# Patient Record
Sex: Female | Born: 1935
Health system: Southern US, Community
[De-identification: ages and names within clinical notes are randomized; demographics above are authoritative.]

## PROBLEM LIST (undated history)

## (undated) DIAGNOSIS — M542 Cervicalgia: Secondary | ICD-10-CM

## (undated) DIAGNOSIS — I890 Lymphedema, not elsewhere classified: Secondary | ICD-10-CM

## (undated) DIAGNOSIS — H409 Unspecified glaucoma: Secondary | ICD-10-CM

## (undated) DIAGNOSIS — J189 Pneumonia, unspecified organism: Secondary | ICD-10-CM

## (undated) DIAGNOSIS — H4011X0 Primary open-angle glaucoma, stage unspecified: Secondary | ICD-10-CM

## (undated) DIAGNOSIS — R519 Headache, unspecified: Secondary | ICD-10-CM

## (undated) DIAGNOSIS — M2550 Pain in unspecified joint: Secondary | ICD-10-CM

## (undated) DIAGNOSIS — M1990 Unspecified osteoarthritis, unspecified site: Secondary | ICD-10-CM

## (undated) DIAGNOSIS — N183 Chronic kidney disease, stage 3 unspecified: Secondary | ICD-10-CM

## (undated) DIAGNOSIS — H9190 Unspecified hearing loss, unspecified ear: Secondary | ICD-10-CM

## (undated) DIAGNOSIS — R7309 Other abnormal glucose: Secondary | ICD-10-CM

## (undated) DIAGNOSIS — I10 Essential (primary) hypertension: Secondary | ICD-10-CM

## (undated) DIAGNOSIS — R51 Headache: Secondary | ICD-10-CM

## (undated) DIAGNOSIS — G4700 Insomnia, unspecified: Secondary | ICD-10-CM

## (undated) DIAGNOSIS — Z79899 Other long term (current) drug therapy: Secondary | ICD-10-CM

## (undated) DIAGNOSIS — R413 Other amnesia: Secondary | ICD-10-CM

## (undated) HISTORY — DX: Morbid (severe) obesity due to excess calories: E66.01

## (undated) HISTORY — PX: ABDOMINAL HYSTERECTOMY: SHX81

## (undated) HISTORY — DX: Headache, unspecified: R51.9

## (undated) HISTORY — DX: Headache: R51

## (undated) HISTORY — PX: EYE SURGERY: SHX253

## (undated) HISTORY — DX: Chronic kidney disease, stage 3 unspecified: N18.30

## (undated) HISTORY — DX: Other amnesia: R41.3

## (undated) HISTORY — DX: Other abnormal glucose: R73.09

## (undated) HISTORY — DX: Other long term (current) drug therapy: Z79.899

## (undated) HISTORY — DX: Pain in unspecified joint: M25.50

## (undated) HISTORY — DX: Cervicalgia: M54.2

## (undated) HISTORY — DX: Unspecified hearing loss, unspecified ear: H91.90

## (undated) HISTORY — DX: Unspecified osteoarthritis, unspecified site: M19.90

## (undated) HISTORY — DX: Lymphedema, not elsewhere classified: I89.0

## (undated) HISTORY — DX: Insomnia, unspecified: G47.00

## (undated) HISTORY — DX: Chronic kidney disease, stage 3 (moderate): N18.3

## (undated) HISTORY — DX: Primary open-angle glaucoma, stage unspecified: H40.11X0

---

## 1997-12-01 ENCOUNTER — Encounter: Admission: RE | Admit: 1997-12-01 | Discharge: 1998-03-01 | Payer: Self-pay | Admitting: Gastroenterology

## 1998-01-12 ENCOUNTER — Ambulatory Visit (HOSPITAL_COMMUNITY): Admission: RE | Admit: 1998-01-12 | Discharge: 1998-01-12 | Payer: Self-pay | Admitting: Gastroenterology

## 1998-06-21 ENCOUNTER — Encounter: Payer: Self-pay | Admitting: Cardiology

## 1998-06-21 ENCOUNTER — Ambulatory Visit (HOSPITAL_COMMUNITY): Admission: RE | Admit: 1998-06-21 | Discharge: 1998-06-21 | Payer: Self-pay | Admitting: Cardiology

## 1998-07-08 ENCOUNTER — Ambulatory Visit (HOSPITAL_COMMUNITY): Admission: RE | Admit: 1998-07-08 | Discharge: 1998-07-08 | Payer: Self-pay | Admitting: Cardiology

## 1999-01-14 ENCOUNTER — Ambulatory Visit (HOSPITAL_COMMUNITY): Admission: RE | Admit: 1999-01-14 | Discharge: 1999-01-14 | Payer: Self-pay | Admitting: Endocrinology

## 1999-08-11 ENCOUNTER — Encounter: Payer: Self-pay | Admitting: Cardiology

## 1999-08-11 ENCOUNTER — Ambulatory Visit (HOSPITAL_COMMUNITY): Admission: RE | Admit: 1999-08-11 | Discharge: 1999-08-11 | Payer: Self-pay | Admitting: Cardiology

## 1999-09-28 ENCOUNTER — Encounter: Admission: RE | Admit: 1999-09-28 | Discharge: 1999-10-17 | Payer: Self-pay | Admitting: Orthopedic Surgery

## 2000-08-30 ENCOUNTER — Ambulatory Visit (HOSPITAL_COMMUNITY): Admission: RE | Admit: 2000-08-30 | Discharge: 2000-08-30 | Payer: Self-pay | Admitting: Gastroenterology

## 2001-03-19 ENCOUNTER — Encounter: Payer: Self-pay | Admitting: Ophthalmology

## 2001-03-21 ENCOUNTER — Ambulatory Visit (HOSPITAL_COMMUNITY): Admission: RE | Admit: 2001-03-21 | Discharge: 2001-03-21 | Payer: Self-pay | Admitting: Ophthalmology

## 2001-09-19 ENCOUNTER — Encounter: Admission: RE | Admit: 2001-09-19 | Discharge: 2001-09-19 | Payer: Self-pay | Admitting: Endocrinology

## 2001-09-19 ENCOUNTER — Encounter: Payer: Self-pay | Admitting: Endocrinology

## 2002-04-21 ENCOUNTER — Encounter: Payer: Self-pay | Admitting: Endocrinology

## 2002-04-21 ENCOUNTER — Ambulatory Visit (HOSPITAL_COMMUNITY): Admission: RE | Admit: 2002-04-21 | Discharge: 2002-04-21 | Payer: Self-pay | Admitting: Endocrinology

## 2002-05-15 ENCOUNTER — Encounter: Payer: Self-pay | Admitting: Ophthalmology

## 2002-05-22 ENCOUNTER — Ambulatory Visit (HOSPITAL_COMMUNITY): Admission: RE | Admit: 2002-05-22 | Discharge: 2002-05-23 | Payer: Self-pay | Admitting: Ophthalmology

## 2002-05-30 ENCOUNTER — Ambulatory Visit (HOSPITAL_COMMUNITY): Admission: RE | Admit: 2002-05-30 | Discharge: 2002-05-31 | Payer: Self-pay | Admitting: Ophthalmology

## 2003-05-04 ENCOUNTER — Encounter: Payer: Self-pay | Admitting: Endocrinology

## 2003-05-04 ENCOUNTER — Ambulatory Visit (HOSPITAL_COMMUNITY): Admission: RE | Admit: 2003-05-04 | Discharge: 2003-05-04 | Payer: Self-pay | Admitting: Endocrinology

## 2003-08-26 ENCOUNTER — Emergency Department (HOSPITAL_COMMUNITY): Admission: AD | Admit: 2003-08-26 | Discharge: 2003-08-26 | Payer: Self-pay | Admitting: Family Medicine

## 2003-10-09 ENCOUNTER — Ambulatory Visit (HOSPITAL_COMMUNITY): Admission: RE | Admit: 2003-10-09 | Discharge: 2003-10-11 | Payer: Self-pay | Admitting: Ophthalmology

## 2003-12-16 ENCOUNTER — Ambulatory Visit (HOSPITAL_COMMUNITY): Admission: RE | Admit: 2003-12-16 | Discharge: 2003-12-16 | Payer: Self-pay | Admitting: Pulmonary Disease

## 2003-12-25 ENCOUNTER — Emergency Department (HOSPITAL_COMMUNITY): Admission: EM | Admit: 2003-12-25 | Discharge: 2003-12-25 | Payer: Self-pay | Admitting: Emergency Medicine

## 2004-08-18 ENCOUNTER — Encounter: Admission: RE | Admit: 2004-08-18 | Discharge: 2004-08-18 | Payer: Self-pay | Admitting: General Surgery

## 2004-08-18 ENCOUNTER — Ambulatory Visit (HOSPITAL_COMMUNITY): Admission: RE | Admit: 2004-08-18 | Discharge: 2004-08-18 | Payer: Self-pay | Admitting: General Surgery

## 2004-09-21 ENCOUNTER — Encounter (INDEPENDENT_AMBULATORY_CARE_PROVIDER_SITE_OTHER): Payer: Self-pay | Admitting: Specialist

## 2004-09-21 ENCOUNTER — Ambulatory Visit (HOSPITAL_COMMUNITY): Admission: RE | Admit: 2004-09-21 | Discharge: 2004-09-21 | Payer: Self-pay | Admitting: General Surgery

## 2005-06-05 ENCOUNTER — Ambulatory Visit (HOSPITAL_COMMUNITY): Admission: RE | Admit: 2005-06-05 | Discharge: 2005-06-05 | Payer: Self-pay | Admitting: Pulmonary Disease

## 2005-11-13 ENCOUNTER — Ambulatory Visit (HOSPITAL_COMMUNITY): Admission: RE | Admit: 2005-11-13 | Discharge: 2005-11-13 | Payer: Self-pay | Admitting: Pulmonary Disease

## 2006-10-08 ENCOUNTER — Emergency Department (HOSPITAL_COMMUNITY): Admission: EM | Admit: 2006-10-08 | Discharge: 2006-10-09 | Payer: Self-pay | Admitting: Emergency Medicine

## 2007-07-12 ENCOUNTER — Encounter: Admission: RE | Admit: 2007-07-12 | Discharge: 2007-07-31 | Payer: Self-pay | Admitting: Pulmonary Disease

## 2007-08-06 ENCOUNTER — Encounter: Admission: RE | Admit: 2007-08-06 | Discharge: 2007-08-08 | Payer: Self-pay | Admitting: Pulmonary Disease

## 2010-08-20 ENCOUNTER — Encounter: Payer: Self-pay | Admitting: Pulmonary Disease

## 2010-09-13 ENCOUNTER — Ambulatory Visit (HOSPITAL_COMMUNITY)
Admission: RE | Admit: 2010-09-13 | Discharge: 2010-09-13 | Disposition: A | Discharge status: Home / Self Care | Payer: Medicare HMO | Source: Ambulatory Visit | Attending: Pulmonary Disease | Admitting: Pulmonary Disease

## 2010-09-13 ENCOUNTER — Other Ambulatory Visit (HOSPITAL_COMMUNITY): Payer: Self-pay | Admitting: Pulmonary Disease

## 2010-09-13 DIAGNOSIS — R059 Cough, unspecified: Secondary | ICD-10-CM | POA: Insufficient documentation

## 2010-09-13 DIAGNOSIS — R05 Cough: Secondary | ICD-10-CM

## 2010-12-16 NOTE — Procedures (Signed)
[place]  Patient:    [name], [name]                       MRN: [id number] Proc. Date: 08/30/00 Adm. Date:  [id number] Attending:  Juanita Craver CC:         Elayne Snare, M.D.                           Procedure Report  DATE OF BIRTH:  July 14, 1929  REFERRING PHYSICIAN:  Elayne Snare, M.D.  PROCEDURE PERFORMED:  Colonoscopy.  ENDOSCOPIST:  Nelwyn Salisbury, M.D.  INSTRUMENT USED:  Olympus video colonoscope.  INDICATIONS FOR PROCEDURE:  History of colonic polyps removed over six years ago in a 77 year old black female, rule out recurrent polyps.  PREPROCEDURE PREPARATION:  Informed consent was procured from the patient. The patient was fasted for eight hours prior to the procedure and prepped with a bottle of magnesium citrate and a gallon of NuLytely the night prior to the procedure.  PREPROCEDURE PHYSICAL:  The patient had stable vital signs.  Neck supple. Chest clear to auscultation.  S1, S2 regular.  Abdomen soft with normal abdominal bowel sounds.  DESCRIPTION OF PROCEDURE:  The patient was placed in the left lateral decubitus position and sedated with 20 mg of Demerol and 3 mg of Versed intravenously.  Once the patient was adequately sedated and maintained on low-flow oxygen and continuous cardiac monitoring, the Olympus video colonoscope was advanced from the rectum to the cecum with difficulty secondary to a large amount of residual stool in the colon.  The patient had a very poor prep.  There was severe diverticular disease in the left colon with inspissated stool in the several of the diverticular pockets.  No masses, polyps or erosions were seen and the procedure was complete up to the cecum. The cecal base was full of solid stool and therefore cecal base was not visualized.  The patient tolerated the procedure well without complications. Small internal hemorrhoids were seen on retroflexion.  IMPRESSION: 1. Small nonbleeding  internal hemorrhoid. 2. Severe diverticulosis in left colon with stool in several of the    diverticular pockets. 3. Large amount of residual stool in the colon.  No masses or polyps seen.    A very small lesion may have been missed.  RECOMMENDATIONS: 1. The patient has been advised to avoid nuts, seeds and popcorn in her    diet.  A high fiber diet has been recommended. 2. Outpatient follow-up is advised on a p.r.n. basis.  Repeat colorectal    cancer screening is recommended in the next 10 years or earlier if the    patient were to develop any abnormal symptoms in the interim.DD:  08/30/00 TD:  08/30/00 Job: [id number]

## 2010-12-16 NOTE — Discharge Summary (Signed)
NAME:  [name], [name]                          ACCOUNT NO.:  [id number]   MEDICAL RECORD NO.:  [id number]                   PATIENT TYPE:  OIB   LOCATION:  [id number]                                 FACILITY:  [facility]   PHYSICIAN:  Garey Ham, M.D.             DATE OF BIRTH:  06-21-1930   DATE OF ADMISSION:  05/30/2002  DATE OF DISCHARGE:  05/31/2002                                 DISCHARGE SUMMARY   HISTORY OF PRESENT ILLNESS:  This was an urgent emergency outpatient  admission of this 77 year old black female who had retained cataract  fragments following a recent complicated  cataract implant procedure.  Secondary increased intraocular pressure was noted with applanation  tonometry at 52 mm in the left eye.  The patient had a previous history of  chronic open-angle glaucoma and successful trabeculectomy surgery in the  left eye.  The patient was felt to be in satisfactory condition for the  proposed surgery.   HOSPITAL COURSE:  The patient was taken to the operating room where a  complex posterior vitrectomy was carried out using a vitreous infusion  suction cutter.  The retained lens fragments were removed, and no retinal  tears or detachment areas were noted either at the posterior retina or in  the peripheral retina.  The intraocular lens implant was left in place as  well as the trabeculectomy conjunctival bleb.   The patient was taken to the recovery room and subsequently to the 23-hour  observation unit.  The patient was seen on the evening of surgery and the  following morning and felt to be progressing nicely following the complex  surgery.  Slit lamp examination revealed a much clearer cornea, deep  anterior chamber.  A few cortical lens cells were seen on the anterior  implant surface.  The fundus was difficult to visualize through the  moderately dilated pupil, but no lens fragments were seen.  Applanation  tonometry was recorded at 19 to 20 mm by applanation.  It  was felt that the  patient had achieved maximal hospital benefit and that she would be  discharged home to be followed in the office.   The patient was given a printed list of discharge instructions on the care  and use of the operated eye.   Discharge ocular medications included TobraDex and Cyclomydril ophthalmic  solution 1 drop 4 times a day to the operated left eye.  The patient was to  continue her glaucoma medications in the right eye.   FOLLOW UP:  My office, 06/02/2002.   CONDITION ON DISCHARGE:  Improved.   DISCHARGE DIAGNOSES:  1. Retained cataract fragments followed recent cataract surgery.  2. Chronic open-angle glaucoma, both eyes.  3. Acute inflammatory ocular hypertension, left eye.  4. Non-insulin-dependent diabetes mellitus.  5. Hypertension.  Garey Ham, M.D.    HNJ/MEDQ  D:  05/31/2002  T:  06/01/2002  Job:  [id number]

## 2010-12-16 NOTE — Op Note (Signed)
NAMEKWANZAA, FUJITANI                ACCOUNT NO.:  [id number]   MEDICAL RECORD NO.:  [id number]          PATIENT TYPE:  OIB   LOCATION:  NA                           FACILITY:  [facility]   PHYSICIAN:  Kathrin Penner, M.D.   DATE OF BIRTH:  10-Jun-1930   DATE OF PROCEDURE:  09/21/2004  DATE OF DISCHARGE:                                 OPERATIVE REPORT   PREOPERATIVE DIAGNOSIS:  Florid atypical ductal hyperplasia, right breast,  rule out carcinoma.   POSTOPERATIVE DIAGNOSIS:  Florid atypical ductal hyperplasia, right breast,  rule out carcinoma.   PROCEDURE:  Right partial mastectomy following needle localization.   SURGEON:  Kathrin Penner, M.D.   ASSISTANT:  None.   ANESTHESIA:  General.   NOTE:  Ms. Staunton is a 77 year old female who on routine mammogram was noted  to have a mass at approximately the 2 o'clock axis of the right breast.  She  underwent stereotactic biopsy of this is lesion, which does not image on  ultrasound.  She had a total of 12 stereotactic biopsies. all of which  showed atypical ductal hyperplasia.  The patient comes for resection of this  region to rule out the possibility of carcinoma.   She understands the risks and potential benefits of surgery and gives  consent.   PROCEDURE:  Following induction of satisfactory general anesthesia, the  patient is positioned supinely and the right breast prepped and draped to be  included in the sterile operative field.  I made an elliptical incision  around the two localizing needles extending across the upper portion of the  breast.  Incision was made, deepened through skin to the subcutaneous  tissues.  Superior,inferior, lateral and medial flaps were raised so as to  get a wide margin.  The margin was then taken down to the pectoralis fascia  on all sides and then dissected free from the pectoralis fascia and removed  in its entirety and forwarded for specimen mammography.  Specimen  mammography showed the  calcifications to be located well within the region  of the specimen.  All areas of dissection was then checked for hemostasis,  additional bleeding points treated electrocautery.  Sponge and instrument  counts were verified.  The subcutaneous tissue was then closed with  interrupted 3-0 Vicryl suture.  The skin was closed with a running 4-0  Monocryl suture and then reinforced with Dermabond.  The anesthetic was then  reversed, the patient removed from the operating room to the recovery room  in stable condition.  She tolerated the procedure well.     PB/MEDQ  D:  09/21/2004  T:  09/21/2004  Job:  [id number]

## 2010-12-16 NOTE — H&P (Signed)
NAME:  [name]                          ACCOUNT NO.:  [id number]   MEDICAL RECORD NO.:  [id number]                   PATIENT TYPE:  OIB   LOCATION:  [id number]                                 FACILITY:  [facility]   PHYSICIAN:  Garey Ham, M.D.             DATE OF BIRTH:  June 15, 1930   DATE OF ADMISSION:  05/30/2002  DATE OF DISCHARGE:  05/31/2002                                HISTORY & PHYSICAL   REASON FOR ADMISSION:  This was an urgent outpatient admission of this 77-  year-old black female admitted with retained cataract fragments following  recent cataract surgery.   HISTORY OF PRESENT ILLNESS:  This patient was previously admitted as an  outpatient by Dr. Marylynn Tran, her regular ophthalmologist, and underwent  complicated cataract implant surgery of the left eye approximately one week  ago.  At surgery, it was noted that the posterior capsule had been broken  and that there was retained cataract remnants in the anterior and posterior  chamber.  Postoperatively, the patient's intraocular pressure, which had  been well controlled with trabeculectomy surgery, was noted to be elevated.  The patient was referred to my office for removal of the dislocated retained  lens material.  Examination in my office confirmed these findings, and  arrangements were made for her outpatient admission at this time.   PAST MEDICAL HISTORY:  The patient is in stable general health under the  care of Dr. Dwyane Dee, her endocrinologist for non-insulin-dependent diabetes  mellitus.   CURRENT MEDICATIONS:  The above-noted glaucoma medications and other  systemic medications including Toprol XL, Hyzaar, calcium for arthritis,  glucosamine, and multivitamins and Mobic.   REVIEW OF SYSTEMS:  The patient has no cardiorespiratory complaints.   PHYSICAL EXAMINATION:  VITAL SIGNS:  Blood pressure 132/65, respirations 16,  heart rate 44, temperature 97.1.  GENERAL:  The patient is a pleasant, alert, well  nourished, well developed  black female in acute ocular distress.  HEENT:  Eyes: Visual acuity without correction 20/50 right eye, light  perception left eye; with correction, 20/30 right eye,light perception left  eye.  Applanation tonometry 21 mm right eye, 52 mm let eye.  External ocular  and slit lamp examination:  Right eye:  The right eye is clear with a clear  cornea, deep and clear anterior chamber.  A nuclear cataract is present in  the right eye.  Left eye: There is moderate operative reaction following her  recent cataract implant surgery.  The cornea is edematous and hazy.  There  is a suture at the 2 o'clock position in the peripheral cornea at the site  of the previous cataract surgery.  A conjunctival slightly elevated bleb is  present, and a peripheral iridectomy at the 11 o'clock position, the site of  her previous trabeculectomy surgery.  The anterior chamber is deep with 1 to  2+ flare and cells.  The peripheral iridectomy appears  open.  A posterior  chamber implant is present and appears to be well centered.  Detailed fundus  examination of the right eye reveals a clear cornea, deep and clear anterior  chamber.  Deep glaucomatous cupping is present in the right eye with a  disk/cup ratio of 0.7.  No diabetic retinopathy is seen.  Left eye is  extremely hazy view with residual cortex and nuclear material both  inferiorly adjacent to the ciliary body and peripheral retina and also on  the retinal surface.  CHEST:  Lungs clear to percussion and auscultation.  HEART:  Normal sinus rhythm.  No cardiomegaly, no murmur.  ABDOMEN:  Negative.  EXTREMITIES:  Negative.   ADMISSION DIAGNOSES:  1. Retained cataract fragment following cataract implant surgery.  2. Glaucoma, bilateral.   SURGICAL PLAN:  Removal of retained cataract fragments with pars plana  vitrectomy.  The patient has been given oral discussion and printed  information concerning the surgery and its possible  complications.  She has  signed an informed consent and arrangements made for her outpatient  admission at this time.                                                 Garey Ham, M.D.    HNJ/MEDQ  D:  05/31/2002  T:  06/01/2002  Job:  [id number]   cc:   Marcene Corning., M.D.  [place] [address]  Fax: [phone]   Elayne Snare, M.D.  [id number]  [place] [address]  Fax: [phone]

## 2011-06-27 ENCOUNTER — Other Ambulatory Visit: Payer: Self-pay | Admitting: Pulmonary Disease

## 2011-06-27 DIAGNOSIS — Z780 Asymptomatic menopausal state: Secondary | ICD-10-CM

## 2011-07-06 ENCOUNTER — Other Ambulatory Visit: Payer: Medicare HMO

## 2011-07-10 ENCOUNTER — Ambulatory Visit
Admission: RE | Admit: 2011-07-10 | Discharge: 2011-07-10 | Disposition: A | Discharge status: Home / Self Care | Payer: Medicare HMO | Source: Ambulatory Visit | Attending: Pulmonary Disease | Admitting: Pulmonary Disease

## 2011-07-10 DIAGNOSIS — Z780 Asymptomatic menopausal state: Secondary | ICD-10-CM

## 2012-07-22 ENCOUNTER — Other Ambulatory Visit (HOSPITAL_COMMUNITY): Payer: Self-pay | Admitting: Pulmonary Disease

## 2012-07-22 DIAGNOSIS — R102 Pelvic and perineal pain: Secondary | ICD-10-CM

## 2012-07-29 ENCOUNTER — Ambulatory Visit (HOSPITAL_COMMUNITY)
Admission: RE | Admit: 2012-07-29 | Discharge: 2012-07-29 | Disposition: A | Discharge status: Home / Self Care | Payer: Medicare Other | Source: Ambulatory Visit | Attending: Pulmonary Disease | Admitting: Pulmonary Disease

## 2012-07-29 ENCOUNTER — Other Ambulatory Visit (HOSPITAL_COMMUNITY): Payer: Self-pay | Admitting: Pulmonary Disease

## 2012-07-29 DIAGNOSIS — Z9079 Acquired absence of other genital organ(s): Secondary | ICD-10-CM | POA: Insufficient documentation

## 2012-07-29 DIAGNOSIS — R102 Pelvic and perineal pain: Secondary | ICD-10-CM

## 2012-07-29 DIAGNOSIS — N949 Unspecified condition associated with female genital organs and menstrual cycle: Secondary | ICD-10-CM | POA: Insufficient documentation

## 2012-07-29 DIAGNOSIS — Z90710 Acquired absence of both cervix and uterus: Secondary | ICD-10-CM | POA: Insufficient documentation

## 2012-08-22 ENCOUNTER — Emergency Department (HOSPITAL_COMMUNITY)
Admission: EM | Admit: 2012-08-22 | Discharge: 2012-08-22 | Disposition: A | Discharge status: Home / Self Care | Payer: Medicare Other | Attending: Emergency Medicine | Admitting: Emergency Medicine

## 2012-08-22 ENCOUNTER — Encounter (HOSPITAL_COMMUNITY): Payer: Self-pay | Admitting: Emergency Medicine

## 2012-08-22 DIAGNOSIS — Z79899 Other long term (current) drug therapy: Secondary | ICD-10-CM | POA: Insufficient documentation

## 2012-08-22 DIAGNOSIS — I10 Essential (primary) hypertension: Secondary | ICD-10-CM | POA: Insufficient documentation

## 2012-08-22 DIAGNOSIS — M791 Myalgia, unspecified site: Secondary | ICD-10-CM

## 2012-08-22 DIAGNOSIS — IMO0001 Reserved for inherently not codable concepts without codable children: Secondary | ICD-10-CM | POA: Insufficient documentation

## 2012-08-22 DIAGNOSIS — R209 Unspecified disturbances of skin sensation: Secondary | ICD-10-CM | POA: Insufficient documentation

## 2012-08-22 DIAGNOSIS — H409 Unspecified glaucoma: Secondary | ICD-10-CM | POA: Insufficient documentation

## 2012-08-22 DIAGNOSIS — S8990XA Unspecified injury of unspecified lower leg, initial encounter: Secondary | ICD-10-CM | POA: Insufficient documentation

## 2012-08-22 DIAGNOSIS — M25561 Pain in right knee: Secondary | ICD-10-CM

## 2012-08-22 DIAGNOSIS — W1809XA Striking against other object with subsequent fall, initial encounter: Secondary | ICD-10-CM | POA: Insufficient documentation

## 2012-08-22 DIAGNOSIS — Y9301 Activity, walking, marching and hiking: Secondary | ICD-10-CM | POA: Insufficient documentation

## 2012-08-22 DIAGNOSIS — S99919A Unspecified injury of unspecified ankle, initial encounter: Secondary | ICD-10-CM | POA: Insufficient documentation

## 2012-08-22 DIAGNOSIS — Y9229 Other specified public building as the place of occurrence of the external cause: Secondary | ICD-10-CM | POA: Insufficient documentation

## 2012-08-22 DIAGNOSIS — S139XXA Sprain of joints and ligaments of unspecified parts of neck, initial encounter: Secondary | ICD-10-CM

## 2012-08-22 DIAGNOSIS — M542 Cervicalgia: Secondary | ICD-10-CM | POA: Insufficient documentation

## 2012-08-22 DIAGNOSIS — R51 Headache: Secondary | ICD-10-CM | POA: Insufficient documentation

## 2012-08-22 HISTORY — DX: Unspecified glaucoma: H40.9

## 2012-08-22 HISTORY — DX: Essential (primary) hypertension: I10

## 2012-08-22 MED ORDER — DIAZEPAM 5 MG PO TABS: 2.5000 mg | ORAL_TABLET | Freq: Three times a day (TID) | ORAL | Status: DC | PRN

## 2012-08-22 NOTE — ED Provider Notes (Signed)
History     CSN: [id number]  Arrival date & time 08/22/12  1030   First MD Initiated Contact with Patient 08/22/12 1230      Chief Complaint  Patient presents with  . Fall    (Consider location/radiation/quality/duration/timing/severity/associated sxs/prior treatment) HPI Comments: Pt was walking out of a bank 2 days ago and tripped over the cement parking curb at the front of parking spaces.  She fell and hit her right forehead and bruised her right knee.  She denied LOC, had some bruising to right knee, but has been able to walk, reports soreness to right knee, shoulders.  Has only a mild HA, some mild right side posterior neck pain and now some tingling to fingertips more in right hand.  No weakness.  No N/V.  No CP, SOB, abd pain.  She is not on coumadin or plavix.  Denies confusion.  No difficulty urinating.  She thinks she has arthritis in right knee already.    The history is provided by the patient and a relative.    Past Medical History  Diagnosis Date  . Glaucoma (increased eye pressure)   . Hypertension     Past Surgical History  Procedure Date  . Eye surgery   . Abdominal hysterectomy     History reviewed. No pertinent family history.  History  Substance Use Topics  . Smoking status: Not on file  . Smokeless tobacco: Not on file  . Alcohol Use:     OB History    Grav Para Term Preterm Abortions TAB SAB Ect Mult Living                  Review of Systems  HENT: Positive for neck pain. Negative for neck stiffness.   Respiratory: Negative for shortness of breath.   Cardiovascular: Negative for chest pain.  Gastrointestinal: Negative for nausea, vomiting and abdominal pain.  Genitourinary: Negative for flank pain.  Musculoskeletal: Positive for arthralgias. Negative for back pain.  Neurological: Positive for numbness and headaches. Negative for dizziness, syncope, weakness and light-headedness.  Psychiatric/Behavioral: Negative for confusion.  All other  systems reviewed and are negative.    Allergies  Penicillins  Home Medications   Current Outpatient Rx  Name  Route  Sig  Dispense  Refill  . ACAI PO   Oral   Take 1 capsule by mouth 2 (two) times daily.         Marland Kitchen AMLODIPINE BESYLATE 2.5 MG PO TABS   Oral   Take 2.5 mg by mouth daily.         . ASPIRIN EC 81 MG PO TBEC   Oral   Take 81 mg by mouth daily.         . OS-CAL PO   Oral   Take 1 tablet by mouth daily.         . IBUPROFEN 800 MG PO TABS   Oral   Take 800 mg by mouth every 8 (eight) hours as needed. For pain         . LOSARTAN POTASSIUM-HCTZ 100-25 MG PO TABS   Oral   Take 1 tablet by mouth daily.         . ADULT MULTIVITAMIN W/MINERALS CH   Oral   Take 1 tablet by mouth daily.         Marland Kitchen OMEPRAZOLE 20 MG PO CPDR   Oral   Take 20 mg by mouth daily.         Marland Kitchen DIAZEPAM  5 MG PO TABS   Oral   Take 0.5 tablets (2.5 mg total) by mouth every 8 (eight) hours as needed (muscle spasm).   10 tablet   0     BP 145/50  Pulse 60  Temp 98.1 F (36.7 C) (Oral)  Resp 16  SpO2 97%  Physical Exam  Nursing note and vitals reviewed. Constitutional: She is oriented to person, place, and time. She appears well-developed and well-nourished.  Non-toxic appearance. She does not have a sickly appearance. She does not appear ill. No distress.       obese  HENT:  Head: Normocephalic and atraumatic.  Eyes: EOM are normal.  Neck: Normal range of motion. Neck supple. Muscular tenderness present. No spinous process tenderness present. Normal range of motion present.  Cardiovascular: Normal rate.   Pulmonary/Chest: Effort normal. No respiratory distress.  Abdominal: Soft. She exhibits no distension. There is no tenderness. There is no rebound and no guarding.  Musculoskeletal: She exhibits tenderness.       Right shoulder: She exhibits normal range of motion and no tenderness.       Left shoulder: She exhibits normal range of motion and no tenderness.        Right hip: Normal.       Left hip: Normal.       Right knee: She exhibits ecchymosis. She exhibits no swelling, no deformity, no laceration and normal patellar mobility. tenderness found.       Lumbar back: Normal.       Pt is able to straight leg raise with both legs without much difficulty or pain  Neurological: She is alert and oriented to person, place, and time. She displays no atrophy. No cranial nerve deficit. She exhibits normal muscle tone. Coordination normal. GCS eye subscore is 4. GCS verbal subscore is 5. GCS motor subscore is 6.  Skin: Skin is warm. No rash noted. No erythema. No pallor.    ED Course  Procedures (including critical care time)  Labs Reviewed - No data to display No results found.   1. Muscle ache   2. Cervical sprain   3. Knee pain, right    ra sat is 97% and i interpret to be normal   MDM   Patient with fall 2 days ago, mechanically, with various aches and pains requesting evaluation. She fell onto concrete and also hit her right forehead on the concrete parking bar with no loss of consciousness. She reports only a very mild headache. She's not on Coumadin or other blood thinners. She does have some right-sided posterior mild neck pain and reports some mild tingling of her right fingers, but no weakness. This does not sound like a significant peripheral neuropathy where I'm concerned for acute disc herniation. She also has various bruises but has been able to walk and has near complete range of motion of her shoulders, elbows, knees and hips. Patient is reassured and she feels comfortable watching her symptoms and can return for any acute worsening. I suggested that she followup with her primary care physician on Monday if things are not improving. I've given her a prescription for low-dose Valium she can take for muscle activation. She is taking ibuprofen at home which is helping her pain at the moment.        Saddie Benders. Elainna Eshleman, MD 08/22/12 [id number]

## 2012-08-22 NOTE — Discharge Instructions (Signed)
 Cervical Sprain A cervical sprain is an injury in the neck in which the ligaments are stretched or torn. The ligaments are the tissues that hold the bones of the neck (vertebrae) in place.Cervical sprains can range from very mild to very severe. Most cervical sprains get better in 1 to 3 weeks, but it depends on the cause and extent of the injury. Severe cervical sprains can cause the neck vertebrae to be unstable. This can lead to damage of the spinal cord and can result in serious nervous system problems. Your caregiver will determine whether your cervical sprain is mild or severe. CAUSES  Severe cervical sprains may be caused by:  Contact sport injuries (football, rugby, wrestling, hockey, auto racing, gymnastics, diving, martial arts, boxing).  Motor vehicle collisions.  Whiplash injuries. This means the neck is forcefully whipped backward and forward.  Falls. Mild cervical sprains may be caused by:   Awkward positions, such as cradling a telephone between your ear and shoulder.  Sitting in a chair that does not offer proper support.  Working at a poorly Marketing executive station.  Activities that require looking up or down for long periods of time. SYMPTOMS   Pain, soreness, stiffness, or a burning sensation in the front, back, or sides of the neck. This discomfort may develop immediately after injury or it may develop slowly and not begin for 24 hours or more after an injury.  Pain or tenderness directly in the middle of the back of the neck.  Shoulder or upper back pain.  Limited ability to move the neck.  Headache.  Dizziness.  Weakness, numbness, or tingling in the hands or arms.  Muscle spasms.  Difficulty swallowing or chewing.  Tenderness and swelling of the neck. DIAGNOSIS  Most of the time, your caregiver can diagnose this problem by taking your history and doing a physical exam. Your caregiver will ask about any known problems, such as arthritis in the neck  or a previous neck injury. X-rays may be taken to find out if there are any other problems, such as problems with the bones of the neck. However, an X-ray often does not reveal the full extent of a cervical sprain. Other tests such as a computed tomography (CT) scan or magnetic resonance imaging (MRI) may be needed. TREATMENT  Treatment depends on the severity of the cervical sprain. Mild sprains can be treated with rest, keeping the neck in place (immobilization), and pain medicines. Severe cervical sprains need immediate immobilization and an appointment with an orthopedist or neurosurgeon. Several treatment options are available to help with pain, muscle spasms, and other symptoms. Your caregiver may prescribe:  Medicines, such as pain relievers, numbing medicines, or muscle relaxants.  Physical therapy. This can include stretching exercises, strengthening exercises, and posture training. Exercises and improved posture can help stabilize the neck, strengthen muscles, and help stop symptoms from returning.  A neck collar to be worn for short periods of time. Often, these collars are worn for comfort. However, certain collars may be worn to protect the neck and prevent further worsening of a serious cervical sprain. HOME CARE INSTRUCTIONS   Put ice on the injured area.  Put ice in a plastic bag.  Place a towel between your skin and the bag.  Leave the ice on for 15 to 20 minutes, 3 to 4 times a day.  Only take over-the-counter or prescription medicines for pain, discomfort, or fever as directed by your caregiver.  Keep all follow-up appointments as directed by your  caregiver.  Keep all physical therapy appointments as directed by your caregiver.  If a neck collar is prescribed, wear it as directed by your caregiver.  Do not drive while wearing a neck collar.  Make any needed adjustments to your work station to promote good posture.  Avoid positions and activities that make your  symptoms worse.  Warm up and stretch before being active to help prevent problems. SEEK MEDICAL CARE IF:   Your pain is not controlled with medicine.  You are unable to decrease your pain medicine over time as planned.  Your activity level is not improving as expected. SEEK IMMEDIATE MEDICAL CARE IF:   You develop any bleeding, stomach upset, or signs of an allergic reaction to your medicine.  Your symptoms get worse.  You develop new, unexplained symptoms.  You have numbness, tingling, weakness, or paralysis in any part of your body. MAKE SURE YOU:   Understand these instructions.  Will watch your condition.  Will get help right away if you are not doing well or get worse. Document Released: 05/14/2007 Document Revised: 10/09/2011 Document Reviewed: 04/19/2011 [facility] Patient Information 2013 [place], [place].  Knee Pain The knee is the complex joint between your thigh and your lower leg. It is made up of bones, tendons, ligaments, and cartilage. The bones that make up the knee are:  The femur in the thigh.  The tibia and fibula in the lower leg.  The patella or kneecap riding in the groove on the lower femur. CAUSES  Knee pain is a common complaint with many causes. A few of these causes are:  Injury, such as:  A ruptured ligament or tendon injury.  Torn cartilage.  Medical conditions, such as:  Gout  Arthritis  Infections  Overuse, over training or overdoing a physical activity. Knee pain can be minor or severe. Knee pain can accompany debilitating injury. Minor knee problems often respond well to self-care measures or get well on their own. More serious injuries may need medical intervention or even surgery. SYMPTOMS The knee is complex. Symptoms of knee problems can vary widely. Some of the problems are:  Pain with movement and weight bearing.  Swelling and tenderness.  Buckling of the knee.  Inability to straighten or extend your knee.  Your  knee locks and you cannot straighten it.  Warmth and redness with pain and fever.  Deformity or dislocation of the kneecap. DIAGNOSIS  Determining what is wrong may be very straight forward such as when there is an injury. It can also be challenging because of the complexity of the knee. Tests to make a diagnosis may include:  Your caregiver taking a history and doing a physical exam.  Routine X-rays can be used to rule out other problems. X-rays will not reveal a cartilage tear. Some injuries of the knee can be diagnosed by:  Arthroscopy a surgical technique by which a small video camera is inserted through tiny incisions on the sides of the knee. This procedure is used to examine and repair internal knee joint problems. Tiny instruments can be used during arthroscopy to repair the torn knee cartilage (meniscus).  Arthrography is a radiology technique. A contrast liquid is directly injected into the knee joint. Internal structures of the knee joint then become visible on X-ray film.  An MRI scan is a non x-ray radiology procedure in which magnetic fields and a computer produce two- or three-dimensional images of the inside of the knee. Cartilage tears are often visible using an MRI  scanner. MRI scans have largely replaced arthrography in diagnosing cartilage tears of the knee.  Blood work.  Examination of the fluid that helps to lubricate the knee joint (synovial fluid). This is done by taking a sample out using a needle and a syringe. TREATMENT The treatment of knee problems depends on the cause. Some of these treatments are:  Depending on the injury, proper casting, splinting, surgery or physical therapy care will be needed.  Give yourself adequate recovery time. Do not overuse your joints. If you begin to get sore during workout routines, back off. Slow down or do fewer repetitions.  For repetitive activities such as cycling or running, maintain your strength and  nutrition.  Alternate muscle groups. For example if you are a weight lifter, work the upper body on one day and the lower body the next.  Either tight or weak muscles do not give the proper support for your knee. Tight or weak muscles do not absorb the stress placed on the knee joint. Keep the muscles surrounding the knee strong.  Take care of mechanical problems.  If you have flat feet, orthotics or special shoes may help. See your caregiver if you need help.  Arch supports, sometimes with wedges on the inner or outer aspect of the heel, can help. These can shift pressure away from the side of the knee most bothered by osteoarthritis.  A brace called an unloader brace also may be used to help ease the pressure on the most arthritic side of the knee.  If your caregiver has prescribed crutches, braces, wraps or ice, use as directed. The acronym for this is PRICE. This means protection, rest, ice, compression and elevation.  Nonsteroidal anti-inflammatory drugs (NSAID's), can help relieve pain. But if taken immediately after an injury, they may actually increase swelling. Take NSAID's with food in your stomach. Stop them if you develop stomach problems. Do not take these if you have a history of ulcers, stomach pain or bleeding from the bowel. Do not take without your caregiver's approval if you have problems with fluid retention, heart failure, or kidney problems.  For ongoing knee problems, physical therapy may be helpful.  Glucosamine and chondroitin are over-the-counter dietary supplements. Both may help relieve the pain of osteoarthritis in the knee. These medicines are different from the usual anti-inflammatory drugs. Glucosamine may decrease the rate of cartilage destruction.  Injections of a corticosteroid drug into your knee joint may help reduce the symptoms of an arthritis flare-up. They may provide pain relief that lasts a few months. You may have to wait a few months between  injections. The injections do have a small increased risk of infection, water retention and elevated blood sugar levels.  Hyaluronic acid injected into damaged joints may ease pain and provide lubrication. These injections may work by reducing inflammation. A series of shots may give relief for as long as 6 months.  Topical painkillers. Applying certain ointments to your skin may help relieve the pain and stiffness of osteoarthritis. Ask your pharmacist for suggestions. Many over the-counter products are approved for temporary relief of arthritis pain.  In some countries, doctors often prescribe topical NSAID's for relief of chronic conditions such as arthritis and tendinitis. A review of treatment with NSAID creams found that they worked as well as oral medications but without the serious side effects. PREVENTION  Maintain a healthy weight. Extra pounds put more strain on your joints.  Get strong, stay limber. Weak muscles are a common cause of knee  injuries. Stretching is important. Include flexibility exercises in your workouts.  Be smart about exercise. If you have osteoarthritis, chronic knee pain or recurring injuries, you may need to change the way you exercise. This does not mean you have to stop being active. If your knees ache after jogging or playing basketball, consider switching to swimming, water aerobics or other low-impact activities, at least for a few days a week. Sometimes limiting high-impact activities will provide relief.  Make sure your shoes fit well. Choose footwear that is right for your sport.  Protect your knees. Use the proper gear for knee-sensitive activities. Use kneepads when playing volleyball or laying carpet. Buckle your seat belt every time you drive. Most shattered kneecaps occur in car accidents.  Rest when you are tired. SEEK MEDICAL CARE IF:  You have knee pain that is continual and does not seem to be getting better.  SEEK IMMEDIATE MEDICAL CARE IF:   Your knee joint feels hot to the touch and you have a high fever. MAKE SURE YOU:   Understand these instructions.  Will watch your condition.  Will get help right away if you are not doing well or get worse. Document Released: 05/14/2007 Document Revised: 10/09/2011 Document Reviewed: 05/14/2007 [facility] Patient Information 2013 [place], [place].   Narcotic and benzodiazepine use may cause drowsiness, slowed breathing or dependence.  Please use with caution and do not drive, operate machinery or watch young children alone while taking them.  Taking combinations of these medications or drinking alcohol  will potentiate these effects.

## 2012-08-22 NOTE — ED Notes (Addendum)
Patient presents to ED today with c/o fall on Tuesday. Pt states she tripped and fell over a curb. Patient states she hit her right side, states her right leg and right shoulder are sore. Pt also states she hit the right side of her forehead.

## 2012-09-03 ENCOUNTER — Other Ambulatory Visit (HOSPITAL_COMMUNITY): Payer: Self-pay | Admitting: Pulmonary Disease

## 2012-09-03 DIAGNOSIS — S0990XA Unspecified injury of head, initial encounter: Secondary | ICD-10-CM

## 2012-09-04 ENCOUNTER — Ambulatory Visit (HOSPITAL_COMMUNITY)
Admission: RE | Admit: 2012-09-04 | Discharge: 2012-09-04 | Disposition: A | Discharge status: Home / Self Care | Payer: Medicare Other | Source: Ambulatory Visit | Attending: Pulmonary Disease | Admitting: Pulmonary Disease

## 2012-09-04 ENCOUNTER — Other Ambulatory Visit (HOSPITAL_COMMUNITY): Payer: Self-pay | Admitting: Pulmonary Disease

## 2012-09-04 DIAGNOSIS — S0990XA Unspecified injury of head, initial encounter: Secondary | ICD-10-CM

## 2012-09-04 DIAGNOSIS — W19XXXA Unspecified fall, initial encounter: Secondary | ICD-10-CM

## 2012-09-04 DIAGNOSIS — R51 Headache: Secondary | ICD-10-CM | POA: Insufficient documentation

## 2012-09-04 DIAGNOSIS — M47812 Spondylosis without myelopathy or radiculopathy, cervical region: Secondary | ICD-10-CM | POA: Insufficient documentation

## 2012-09-04 DIAGNOSIS — M542 Cervicalgia: Secondary | ICD-10-CM | POA: Insufficient documentation

## 2012-12-04 ENCOUNTER — Ambulatory Visit (INDEPENDENT_AMBULATORY_CARE_PROVIDER_SITE_OTHER): Payer: Medicare Other | Admitting: Neurology

## 2012-12-04 ENCOUNTER — Encounter: Payer: Self-pay | Admitting: Neurology

## 2012-12-04 VITALS — BP 170/68 | HR 62 | Ht 59.0 in | Wt 177.0 lb

## 2012-12-04 DIAGNOSIS — G4700 Insomnia, unspecified: Secondary | ICD-10-CM | POA: Insufficient documentation

## 2012-12-04 DIAGNOSIS — M1990 Unspecified osteoarthritis, unspecified site: Secondary | ICD-10-CM

## 2012-12-04 DIAGNOSIS — Z79899 Other long term (current) drug therapy: Secondary | ICD-10-CM | POA: Insufficient documentation

## 2012-12-04 DIAGNOSIS — H401190 Primary open-angle glaucoma, unspecified eye, stage unspecified: Secondary | ICD-10-CM | POA: Insufficient documentation

## 2012-12-04 DIAGNOSIS — M542 Cervicalgia: Secondary | ICD-10-CM | POA: Insufficient documentation

## 2012-12-04 DIAGNOSIS — R7309 Other abnormal glucose: Secondary | ICD-10-CM

## 2012-12-04 DIAGNOSIS — M2550 Pain in unspecified joint: Secondary | ICD-10-CM | POA: Insufficient documentation

## 2012-12-04 DIAGNOSIS — H409 Unspecified glaucoma: Secondary | ICD-10-CM

## 2012-12-04 DIAGNOSIS — R51 Headache: Secondary | ICD-10-CM

## 2012-12-04 DIAGNOSIS — I10 Essential (primary) hypertension: Secondary | ICD-10-CM

## 2012-12-04 DIAGNOSIS — H4011X0 Primary open-angle glaucoma, stage unspecified: Secondary | ICD-10-CM

## 2012-12-04 DIAGNOSIS — I890 Lymphedema, not elsewhere classified: Secondary | ICD-10-CM

## 2012-12-04 NOTE — Progress Notes (Signed)
History: 79 years old right-handed Caucasian female, referred by her primary care physician Dr. Katherine Roan for evaluation of right-sided headaches.  She had a past medical history of glaucoma, cataracts, poor vision after multiple eye surgeries, also had a history of hypertension, chronic bilateral lower extremity swelling, gait difficulty  In January 2014, because of the poor vision, she tripped over object on the floor, fell forward, landed on her right parietal region, no loss of consciousness, but ever since the event, she has made great constant achy pain at the  right frontal area, spreading to right parietal, right occipital, constant achy,  She denied chewing difficulty, no swallowing difficulty, no change from her baseline gait difficulty.  There was no bilateral lower extremity numbness, or weakness,  Review of Systems  Out of a complete 14 system review, the patient complains of only the following symptoms, and all other reviewed systems are negative.   Constitutional:   N/A Cardiovascular:  N/A Ear/Nose/Throat:  Hearing loss Skin: N/A Eyes: blurred vision Respiratory: N/A Gastroitestinal: N/A    Hematology/Lymphatic:  N/A Endocrine:  N/A Musculoskeletal: joint pain, swelling, aching muscles Allergy/Immunology:  Allergies, skin sensitivity Neurological: memory loss, headache, sleepiness Psychiatric:    Anxiety, too much sleep, decreased energy.  PHYSICAL EXAMINATOINS:  Generalized: In no acute distress  Neck: Supple, no carotid bruits   Cardiac: Regular rate rhythm  Pulmonary: Clear to auscultation bilaterally  Musculoskeletal: No deformity  Neurological examination  Mentation: Alert oriented to time, place, history taking, and causual conversation, poor dentation  Cranial nerve II-XII: post surgical changes of irregular pupils. extraocular movements were full, visual field were full on confrontational test. facial sensation and strength were normal. hearing was  intact to finger rubbing bilaterally. Uvula tongue midline.  head turning and shoulder shrug and were normal and symmetric.Tongue protrusion into cheek strength was normal.  Motor: normal tone, bulk and strength. With bilateral lower extremity swelling  Sensory: Intact to fine touch, pinprick, preserved vibratory sensation, and proprioception at toes.  Coordination: Normal finger to nose, heel-to-shin bilaterally there was no truncal ataxia  Gait: need assistant to get up from seated position, cautious, small stride, unsteady gait.   Deep tendon reflexes: Brachioradialis 2/2, biceps 2/2, triceps 2/2, patellar 1/1, Achilles 0/0, plantar responses were flexor bilaterally.   Assessment and Plan:  79 yo RH AAF with recent fall, complains of right-sided headaches, low grade achy pain  1 laboratory evaluation including ESR C-reactive protein TSH, to rule out temporal arteritis, .  2 MRI of brain 3 return to clinic as needed ibuprofen when necessary.

## 2012-12-05 LAB — TSH: TSH: 1.86 u[IU]/mL (ref 0.450–4.500)

## 2012-12-05 LAB — C-REACTIVE PROTEIN: CRP: 0.8 mg/L (ref 0.0–4.9)

## 2012-12-12 ENCOUNTER — Ambulatory Visit
Admission: RE | Admit: 2012-12-12 | Discharge: 2012-12-12 | Disposition: A | Discharge status: Home / Self Care | Payer: Medicare Other | Source: Ambulatory Visit | Attending: Neurology | Admitting: Neurology

## 2012-12-12 DIAGNOSIS — M542 Cervicalgia: Secondary | ICD-10-CM

## 2012-12-12 DIAGNOSIS — R51 Headache: Secondary | ICD-10-CM

## 2012-12-20 ENCOUNTER — Telehealth: Payer: Self-pay | Admitting: Neurology

## 2012-12-20 NOTE — Telephone Encounter (Signed)
Patient called wanting MRI results. Please advise and send back to clinical pool.

## 2013-01-17 NOTE — Progress Notes (Signed)
Quick Note:  I have called her, MRI showed aged related changes, her headache has improved. ______

## 2014-07-01 ENCOUNTER — Other Ambulatory Visit: Payer: Self-pay | Admitting: Family Medicine

## 2014-07-01 DIAGNOSIS — R102 Pelvic and perineal pain: Secondary | ICD-10-CM

## 2014-07-01 DIAGNOSIS — N949 Unspecified condition associated with female genital organs and menstrual cycle: Secondary | ICD-10-CM

## 2014-07-03 ENCOUNTER — Ambulatory Visit
Admission: RE | Admit: 2014-07-03 | Discharge: 2014-07-03 | Disposition: A | Discharge status: Home / Self Care | Payer: Commercial Managed Care - HMO | Source: Ambulatory Visit | Attending: Family Medicine | Admitting: Family Medicine

## 2014-07-03 DIAGNOSIS — N949 Unspecified condition associated with female genital organs and menstrual cycle: Secondary | ICD-10-CM

## 2014-07-03 DIAGNOSIS — R102 Pelvic and perineal pain: Secondary | ICD-10-CM

## 2015-07-02 ENCOUNTER — Telehealth: Payer: Self-pay | Admitting: Pulmonary Disease

## 2015-07-02 NOTE — Telephone Encounter (Signed)
error 

## 2015-07-08 ENCOUNTER — Encounter: Payer: Self-pay | Admitting: Pulmonary Disease

## 2015-07-08 ENCOUNTER — Ambulatory Visit (INDEPENDENT_AMBULATORY_CARE_PROVIDER_SITE_OTHER): Payer: Medicare HMO | Admitting: Pulmonary Disease

## 2015-07-08 VITALS — BP 140/70 | HR 59 | Ht 59.0 in | Wt 163.0 lb

## 2015-07-08 DIAGNOSIS — G4733 Obstructive sleep apnea (adult) (pediatric): Secondary | ICD-10-CM

## 2015-07-08 NOTE — Patient Instructions (Signed)
Will arrange for sleep study Will call to arrange for follow up after sleep study reviewed 

## 2015-07-08 NOTE — Progress Notes (Signed)
Past Medical History She  has a past medical history of Glaucoma (increased eye pressure); Hypertension; Neck pain; Head pain; Pain in joint, multiple sites; Impaired glucose tolerance test; Osteoarthrosis, unspecified whether generalized or localized, other specified sites; Morbid obesity ([name]); Encounter for long-term (current) use of other medications; Other lymphedema; Insomnia, unspecified; Chronic kidney disease, stage III (moderate); and Primary open-angle glaucoma(365.11).  Past Surgical History She  has past surgical history that includes Eye surgery and Abdominal hysterectomy.  Current Outpatient Prescriptions on File Prior to Visit  Medication Sig  . ACAI PO Take 1 capsule by mouth 2 (two) times daily.  Marland Kitchen amLODipine (NORVASC) 2.5 MG tablet Take 2.5 mg by mouth daily.  Marland Kitchen aspirin EC 81 MG tablet Take 81 mg by mouth daily.  . brimonidine (ALPHAGAN) 0.15 % ophthalmic solution 15 drops as directed.  . Calcium Carbonate (OS-CAL PO) Take 1 tablet by mouth daily.  Marland Kitchen ibuprofen (ADVIL,MOTRIN) 800 MG tablet Take 800 mg by mouth every 8 (eight) hours as needed. For pain  . losartan-hydrochlorothiazide (HYZAAR) 100-25 MG per tablet Take 1 tablet by mouth daily.  . Multiple Vitamin (MULTIVITAMIN WITH MINERALS) TABS Take 1 tablet by mouth daily.  Marland Kitchen omeprazole (PRILOSEC) 20 MG capsule Take 20 mg by mouth daily.  . prednisoLONE acetate (PRED FORTE) 1 % ophthalmic suspension 1 drop as directed.   No current facility-administered medications on file prior to visit.    Allergies  Allergen Reactions  . Penicillins Hives  . Tetanus Toxoid     Family History Her family history includes Cancer in her father.  Social History She  reports that she has never smoked. She has never used smokeless tobacco. She reports that she does not drink alcohol or use illicit drugs.  Review of systems Review of Systems  Constitutional: Negative for fever and unexpected weight change.  HENT: Positive for  postnasal drip. Negative for congestion, dental problem, ear pain, nosebleeds, rhinorrhea, sinus pressure, sneezing, sore throat and trouble swallowing.   Eyes: Negative for redness and itching.  Respiratory: Negative for cough, chest tightness, shortness of breath and wheezing.   Cardiovascular: Negative for palpitations and leg swelling.  Gastrointestinal: Negative for nausea and vomiting.  Genitourinary: Negative for dysuria.  Musculoskeletal: Positive for joint swelling.  Skin: Negative for rash.  Neurological: Positive for headaches.  Hematological: Does not bruise/bleed easily.  Psychiatric/Behavioral: Negative for dysphoric mood. The patient is not nervous/anxious.    Chief Complaint  Patient presents with  . SLEEP CONSULT    Pt referred by [name]: pt states she has a lot of dozing all hours of the day, she only sleeps about 4 hours a night but pt states those 4 hours she is "knocked out".  pt states he was diagnosed with sleep apnea about 20/ 20 years ago and did use a machine but has not used a machine in about 5 years. Epworth Score: 12    Tests:  Vital signs BP 140/70 mmHg  Pulse 59  Ht 4\' 11"  (1.499 m)  Wt 163 lb (73.936 kg)  BMI 32.90 kg/m2  SpO2 99%  History of Present Illness [name] is a 81 y.o. female for evaluation of sleep problems.  She had sleep study several years ago.  She was found to have sleep apnea and started on CPAP.  She slept better with CPAP.  Apparently her DME told her she needed new supplies every week.  She thought this was fishy and decided to turn in her CPAP machine.  She snores while asleep, and has to clear her throat when she wakes up.  She feels tired during the day, and can fall asleep easily when sitting, for example in church.  She goes to sleep between 10 pm and midnight.  She falls asleep after few minutes.  She wakes up 1 time to use the bathroom.  She gets out of bed at 530 am.  She feels ok in the morning.  She denies  morning headache.  She does not use anything to help her fall sleep or stay awake.  She denies sleep walking, sleep talking, bruxism, or nightmares.  There is no history of restless legs.  She denies sleep hallucinations, sleep paralysis, or cataplexy.  The Epworth score is 12 out of 24.   Physical Exam:  General - No distress ENT - No sinus tenderness, no oral exudate, no LAN, no thyromegaly, TM clear, poor dentition Cardiac - s1s2 regular, no murmur, pulses symmetric Chest - No wheeze/rales/dullness, good air entry, normal respiratory excursion Back - No focal tenderness Abd - Soft, non-tender, no organomegaly, + bowel sounds Ext - 3+ non pitting edema Neuro - Normal strength, cranial nerves intact Skin - No rashes Psych - Normal mood, and behavior  Discussion: She has snoring, sleep disruption, and daytime sleepiness.  She has hx of HTN.  She has prior hx of sleep apnea.  I suspect she still has sleep apnea.  We discussed how sleep apnea can affect various health problems, including risks for hypertension, cardiovascular disease, and diabetes.  We also discussed how sleep disruption can increase risks for accidents, such as while driving.  Weight loss as a means of improving sleep apnea was also reviewed.  Additional treatment options discussed were CPAP therapy, oral appliance, and surgical intervention.   Assessment/plan:  Obstructive sleep apnea. Plan: - will arrange for in lab sleep study    Patient Instructions  Will arrange for sleep study Will call to arrange for follow up after sleep study reviewed      [name], M.D. Pager [phone]

## 2015-07-08 NOTE — Progress Notes (Deleted)
   Subjective:    Patient ID: [name], female    DOB: February 11, 1930, 81 y.o.   MRN: [id number]  HPI    Review of Systems  Constitutional: Negative for fever and unexpected weight change.  HENT: Positive for postnasal drip. Negative for congestion, dental problem, ear pain, nosebleeds, rhinorrhea, sinus pressure, sneezing, sore throat and trouble swallowing.   Eyes: Negative for redness and itching.  Respiratory: Negative for cough, chest tightness, shortness of breath and wheezing.   Cardiovascular: Negative for palpitations and leg swelling.  Gastrointestinal: Negative for nausea and vomiting.  Genitourinary: Negative for dysuria.  Musculoskeletal: Positive for joint swelling.  Skin: Negative for rash.  Neurological: Positive for headaches.  Hematological: Does not bruise/bleed easily.  Psychiatric/Behavioral: Negative for dysphoric mood. The patient is not nervous/anxious.        Objective:   Physical Exam        Assessment & Plan:

## 2015-09-09 ENCOUNTER — Ambulatory Visit (HOSPITAL_BASED_OUTPATIENT_CLINIC_OR_DEPARTMENT_OTHER): Payer: Medicare HMO | Attending: Pulmonary Disease

## 2015-09-09 VITALS — Ht 59.0 in | Wt 163.0 lb

## 2015-09-09 DIAGNOSIS — R5383 Other fatigue: Secondary | ICD-10-CM | POA: Insufficient documentation

## 2015-09-09 DIAGNOSIS — I119 Hypertensive heart disease without heart failure: Secondary | ICD-10-CM

## 2015-09-09 DIAGNOSIS — G4733 Obstructive sleep apnea (adult) (pediatric): Secondary | ICD-10-CM | POA: Diagnosis not present

## 2015-09-09 DIAGNOSIS — R0683 Snoring: Secondary | ICD-10-CM | POA: Insufficient documentation

## 2015-09-13 ENCOUNTER — Telehealth: Payer: Self-pay | Admitting: Pulmonary Disease

## 2015-09-13 DIAGNOSIS — R0683 Snoring: Secondary | ICD-10-CM | POA: Insufficient documentation

## 2015-09-13 DIAGNOSIS — G4733 Obstructive sleep apnea (adult) (pediatric): Secondary | ICD-10-CM | POA: Diagnosis not present

## 2015-09-13 NOTE — Telephone Encounter (Signed)
PSG 09/09/15 >> AHI 3.2, SpO2 low 87%.  REM AHI 26.7.   Will have my nurse schedule ROV with me to review results of sleep study >> okay to double book visit if needed.

## 2015-09-13 NOTE — Progress Notes (Signed)
Patient Name: [name], Musch Date: 09/09/2015 Gender: Female D.O.B: 1930/05/01 Age (years): 70 Referring Provider: Chesley Mires MD, ABSM Height (inches): 22 Interpreting Physician: Chesley Mires MD, ABSM Weight (lbs): 163 RPSGT: Baxter Flattery BMI: 41 MRN: [id number] Neck Size: 13.00  CLINICAL INFORMATION Sleep Study Type: NPSG   Indication for sleep study: Fatigue, Hypertension, OSA, Snoring   Epworth Sleepiness Score: 12   SLEEP STUDY TECHNIQUE As per the AASM Manual for the Scoring of Sleep and Associated Events v2.3 (April 2016) with a hypopnea requiring 4% desaturations. The channels recorded and monitored were frontal, central and occipital EEG, electrooculogram (EOG), submentalis EMG (chin), nasal and oral airflow, thoracic and abdominal wall motion, anterior tibialis EMG, snore microphone, electrocardiogram, and pulse oximetry.  MEDICATIONS Patient's medications include: reviewed in electronic medical record. Medications self-administered by patient during sleep study : No sleep medicine administered.  SLEEP ARCHITECTURE The study was initiated at 11:17:08 PM and ended at 5:24:13 AM. Sleep onset time was 8.6 minutes and the sleep efficiency was 91.7%. The total sleep time was 336.5 minutes. Stage REM latency was 67.0 minutes. The patient spent 1.04% of the night in stage N1 sleep, 92.87% in stage N2 sleep, 3.42% in stage N3 and 2.67% in REM. Alpha intrusion was absent. Supine sleep was 100.00%.  RESPIRATORY PARAMETERS The overall apnea/hypopnea index (AHI) was 3.2 per hour. There were 0 total apneas, including 0 obstructive, 0 central and 0 mixed apneas. There were 18 hypopneas and 0 RERAs. The AHI during Stage REM sleep was 26.7 per hour. AHI while supine was 3.2 per hour. The mean oxygen saturation was 95.88%. The minimum SpO2 during sleep was 87.00%. Moderate snoring was noted during this study.  CARDIAC DATA The 2 lead EKG demonstrated sinus rhythm. The mean  heart rate was 58.21 beats per minute. Other EKG findings include: None.  LEG MOVEMENT DATA The total PLMS were 17 with a resulting PLMS index of 3.03. Associated arousal with leg movement index was 0.0 .  IMPRESSIONS While she did have several respiratory events, these were not frequent enough to qualify for diagnosis of obstructive sleep apnea.  Her overall AHI was 3.2 with an SaO2 low of 87%.  She did have the majority of respiratory events in REM sleep with a REM AHI of 26.7.  DIAGNOSIS Snoring (R06.83)  RECOMMENDATIONS - Avoid alcohol, sedatives and other CNS depressants that may worsen sleep apnea and disrupt normal sleep architecture. - Sleep hygiene should be reviewed to assess factors that may improve sleep quality. - Weight management and regular exercise should be initiated or continued if appropriate.  Chesley Mires, MD, [place], American Board of Sleep Medicine 09/13/2015, 11:16 AM  NPI: [id number]

## 2015-09-16 NOTE — Telephone Encounter (Signed)
LMTCB x 1 NEEDS OV

## 2015-09-17 NOTE — Telephone Encounter (Signed)
Noted. Thanks. Will close encounter 

## 2015-09-17 NOTE — Telephone Encounter (Signed)
Pt cb, informed her she needed appt to come in and be seen, pt agree'd but states she needed enough time to let transportation know, sched her for next Thursday 09/23/15 at 9am, double booked per VS, nothing further needed

## 2015-09-21 DIAGNOSIS — J189 Pneumonia, unspecified organism: Secondary | ICD-10-CM

## 2015-09-21 HISTORY — DX: Pneumonia, unspecified organism: J18.9

## 2015-09-22 ENCOUNTER — Emergency Department (HOSPITAL_COMMUNITY): Payer: Medicare HMO

## 2015-09-22 ENCOUNTER — Encounter (HOSPITAL_COMMUNITY): Payer: Self-pay | Admitting: Emergency Medicine

## 2015-09-22 ENCOUNTER — Observation Stay (HOSPITAL_COMMUNITY)
Admission: EM | Admit: 2015-09-22 | Discharge: 2015-09-25 | Disposition: A | Discharge status: Home / Self Care | Payer: Medicare HMO | Attending: Internal Medicine | Admitting: Internal Medicine

## 2015-09-22 DIAGNOSIS — I10 Essential (primary) hypertension: Secondary | ICD-10-CM | POA: Diagnosis present

## 2015-09-22 DIAGNOSIS — J111 Influenza due to unidentified influenza virus with other respiratory manifestations: Principal | ICD-10-CM | POA: Insufficient documentation

## 2015-09-22 DIAGNOSIS — G9340 Encephalopathy, unspecified: Secondary | ICD-10-CM | POA: Diagnosis present

## 2015-09-22 DIAGNOSIS — Z79899 Other long term (current) drug therapy: Secondary | ICD-10-CM | POA: Insufficient documentation

## 2015-09-22 DIAGNOSIS — I272 Other secondary pulmonary hypertension: Secondary | ICD-10-CM | POA: Insufficient documentation

## 2015-09-22 DIAGNOSIS — R4182 Altered mental status, unspecified: Secondary | ICD-10-CM | POA: Insufficient documentation

## 2015-09-22 DIAGNOSIS — N183 Chronic kidney disease, stage 3 unspecified: Secondary | ICD-10-CM | POA: Diagnosis present

## 2015-09-22 DIAGNOSIS — J189 Pneumonia, unspecified organism: Secondary | ICD-10-CM | POA: Diagnosis not present

## 2015-09-22 DIAGNOSIS — I739 Peripheral vascular disease, unspecified: Secondary | ICD-10-CM | POA: Diagnosis not present

## 2015-09-22 DIAGNOSIS — R509 Fever, unspecified: Secondary | ICD-10-CM | POA: Diagnosis present

## 2015-09-22 DIAGNOSIS — R531 Weakness: Secondary | ICD-10-CM

## 2015-09-22 DIAGNOSIS — Y92098 Other place in other non-institutional residence as the place of occurrence of the external cause: Secondary | ICD-10-CM | POA: Diagnosis not present

## 2015-09-22 DIAGNOSIS — W19XXXA Unspecified fall, initial encounter: Secondary | ICD-10-CM

## 2015-09-22 DIAGNOSIS — R600 Localized edema: Secondary | ICD-10-CM | POA: Diagnosis not present

## 2015-09-22 DIAGNOSIS — Z7982 Long term (current) use of aspirin: Secondary | ICD-10-CM | POA: Diagnosis not present

## 2015-09-22 DIAGNOSIS — D72819 Decreased white blood cell count, unspecified: Secondary | ICD-10-CM | POA: Diagnosis not present

## 2015-09-22 DIAGNOSIS — E872 Acidosis, unspecified: Secondary | ICD-10-CM | POA: Diagnosis present

## 2015-09-22 DIAGNOSIS — R739 Hyperglycemia, unspecified: Secondary | ICD-10-CM | POA: Diagnosis not present

## 2015-09-22 DIAGNOSIS — N179 Acute kidney failure, unspecified: Secondary | ICD-10-CM | POA: Diagnosis not present

## 2015-09-22 DIAGNOSIS — Z880 Allergy status to penicillin: Secondary | ICD-10-CM | POA: Insufficient documentation

## 2015-09-22 DIAGNOSIS — R05 Cough: Secondary | ICD-10-CM | POA: Diagnosis not present

## 2015-09-22 DIAGNOSIS — K5909 Other constipation: Secondary | ICD-10-CM | POA: Diagnosis not present

## 2015-09-22 DIAGNOSIS — Z887 Allergy status to serum and vaccine status: Secondary | ICD-10-CM | POA: Diagnosis not present

## 2015-09-22 DIAGNOSIS — Y9384 Activity, sleeping: Secondary | ICD-10-CM | POA: Insufficient documentation

## 2015-09-22 DIAGNOSIS — I129 Hypertensive chronic kidney disease with stage 1 through stage 4 chronic kidney disease, or unspecified chronic kidney disease: Secondary | ICD-10-CM | POA: Diagnosis not present

## 2015-09-22 DIAGNOSIS — R918 Other nonspecific abnormal finding of lung field: Secondary | ICD-10-CM

## 2015-09-22 DIAGNOSIS — Z6829 Body mass index (BMI) 29.0-29.9, adult: Secondary | ICD-10-CM | POA: Diagnosis not present

## 2015-09-22 DIAGNOSIS — I517 Cardiomegaly: Secondary | ICD-10-CM | POA: Insufficient documentation

## 2015-09-22 DIAGNOSIS — M1990 Unspecified osteoarthritis, unspecified site: Secondary | ICD-10-CM | POA: Insufficient documentation

## 2015-09-22 DIAGNOSIS — Y998 Other external cause status: Secondary | ICD-10-CM | POA: Insufficient documentation

## 2015-09-22 DIAGNOSIS — M2578 Osteophyte, vertebrae: Secondary | ICD-10-CM | POA: Insufficient documentation

## 2015-09-22 DIAGNOSIS — R0989 Other specified symptoms and signs involving the circulatory and respiratory systems: Secondary | ICD-10-CM | POA: Insufficient documentation

## 2015-09-22 DIAGNOSIS — W08XXXA Fall from other furniture, initial encounter: Secondary | ICD-10-CM | POA: Diagnosis not present

## 2015-09-22 DIAGNOSIS — Y92009 Unspecified place in unspecified non-institutional (private) residence as the place of occurrence of the external cause: Secondary | ICD-10-CM

## 2015-09-22 DIAGNOSIS — K5900 Constipation, unspecified: Secondary | ICD-10-CM | POA: Diagnosis present

## 2015-09-22 HISTORY — DX: Pneumonia, unspecified organism: J18.9

## 2015-09-22 LAB — HEPATIC FUNCTION PANEL
ALT: 22 U/L (ref 14–54)
AST: 61 U/L — ABNORMAL HIGH (ref 15–41)
Albumin: 2.8 g/dL — ABNORMAL LOW (ref 3.5–5.0)
Alkaline Phosphatase: 84 U/L (ref 38–126)
BILIRUBIN DIRECT: 0.1 mg/dL (ref 0.1–0.5)
BILIRUBIN TOTAL: 0.5 mg/dL (ref 0.3–1.2)
Indirect Bilirubin: 0.4 mg/dL (ref 0.3–0.9)
Total Protein: 5.9 g/dL — ABNORMAL LOW (ref 6.5–8.1)

## 2015-09-22 LAB — CBC
HEMATOCRIT: 35.4 % — AB (ref 36.0–46.0)
Hemoglobin: 11 g/dL — ABNORMAL LOW (ref 12.0–15.0)
MCH: 25 pg — ABNORMAL LOW (ref 26.0–34.0)
MCHC: 31.1 g/dL (ref 30.0–36.0)
MCV: 80.5 fL (ref 78.0–100.0)
PLATELETS: 161 10*3/uL (ref 150–400)
RBC: 4.4 MIL/uL (ref 3.87–5.11)
RDW: 15.7 % — AB (ref 11.5–15.5)
WBC: 3 10*3/uL — ABNORMAL LOW (ref 4.0–10.5)

## 2015-09-22 LAB — DIFFERENTIAL
Basophils Absolute: 0 10*3/uL (ref 0.0–0.1)
Basophils Relative: 0 %
EOS ABS: 0 10*3/uL (ref 0.0–0.7)
EOS PCT: 0 %
LYMPHS ABS: 0.4 10*3/uL — AB (ref 0.7–4.0)
Lymphocytes Relative: 14 %
MONOS PCT: 13 %
Monocytes Absolute: 0.4 10*3/uL (ref 0.1–1.0)
NEUTROS PCT: 73 %
Neutro Abs: 2.3 10*3/uL (ref 1.7–7.7)

## 2015-09-22 LAB — URINALYSIS, ROUTINE W REFLEX MICROSCOPIC
BILIRUBIN URINE: NEGATIVE
GLUCOSE, UA: NEGATIVE mg/dL
KETONES UR: NEGATIVE mg/dL
Leukocytes, UA: NEGATIVE
NITRITE: NEGATIVE
PH: 5 (ref 5.0–8.0)
Protein, ur: NEGATIVE mg/dL
Specific Gravity, Urine: 1.014 (ref 1.005–1.030)

## 2015-09-22 LAB — I-STAT CG4 LACTIC ACID, ED
LACTIC ACID, VENOUS: 3.24 mmol/L — AB (ref 0.5–2.0)
LACTIC ACID, VENOUS: 1.03 mmol/L (ref 0.5–2.0)
LACTIC ACID, VENOUS: 2.04 mmol/L — AB (ref 0.5–2.0)

## 2015-09-22 LAB — URINE MICROSCOPIC-ADD ON

## 2015-09-22 LAB — BASIC METABOLIC PANEL
Anion gap: 10 (ref 5–15)
BUN: 17 mg/dL (ref 6–20)
CO2: 23 mmol/L (ref 22–32)
CREATININE: 1.17 mg/dL — AB (ref 0.44–1.00)
Calcium: 8.6 mg/dL — ABNORMAL LOW (ref 8.9–10.3)
Chloride: 104 mmol/L (ref 101–111)
GFR calc Af Amer: 49 mL/min — ABNORMAL LOW (ref >60)
GFR, EST NON AFRICAN AMERICAN: 42 mL/min — AB (ref >60)
GLUCOSE: 157 mg/dL — AB (ref 65–99)
POTASSIUM: 3.4 mmol/L — AB (ref 3.5–5.1)
Sodium: 137 mmol/L (ref 135–145)

## 2015-09-22 LAB — CBG MONITORING, ED: Glucose-Capillary: 139 mg/dL — ABNORMAL HIGH (ref 65–99)

## 2015-09-22 LAB — VITAMIN B12: Vitamin B-12: 1290 pg/mL — ABNORMAL HIGH (ref 180–914)

## 2015-09-22 LAB — GLUCOSE, CAPILLARY
Glucose-Capillary: 116 mg/dL — ABNORMAL HIGH (ref 65–99)
Glucose-Capillary: 88 mg/dL (ref 65–99)

## 2015-09-22 LAB — INFLUENZA PANEL BY PCR (TYPE A & B)
H1N1FLUPCR: NOT DETECTED
INFLAPCR: POSITIVE — AB
INFLBPCR: NEGATIVE

## 2015-09-22 LAB — STREP PNEUMONIAE URINARY ANTIGEN: Strep Pneumo Urinary Antigen: NEGATIVE

## 2015-09-22 LAB — MAGNESIUM: Magnesium: 1.6 mg/dL — ABNORMAL LOW (ref 1.7–2.4)

## 2015-09-22 MED ORDER — LEVOFLOXACIN IN D5W 750 MG/150ML IV SOLN: 750.0000 mg | INTRAVENOUS | Status: DC

## 2015-09-22 MED ORDER — OSELTAMIVIR PHOSPHATE 75 MG PO CAPS
75.0000 mg | ORAL_CAPSULE | Freq: Two times a day (BID) | ORAL | Status: DC
  Administered 2015-09-22 – 2015-09-23 (×2): 75 mg via ORAL
  Filled 2015-09-22 (×2): qty 1

## 2015-09-22 MED ORDER — PREDNISOLONE ACETATE 1 % OP SUSP
1.0000 [drp] | Freq: Every day | OPHTHALMIC | Status: DC
  Administered 2015-09-22 – 2015-09-25 (×4): 1 [drp] via OPHTHALMIC
  Filled 2015-09-22: qty 1
  Filled 2015-09-22: qty 5

## 2015-09-22 MED ORDER — PANTOPRAZOLE SODIUM 40 MG PO TBEC
40.0000 mg | DELAYED_RELEASE_TABLET | Freq: Every day | ORAL | Status: DC
  Administered 2015-09-22 – 2015-09-25 (×4): 40 mg via ORAL
  Filled 2015-09-22 (×4): qty 1

## 2015-09-22 MED ORDER — BISACODYL 10 MG RE SUPP: 10.0000 mg | Freq: Every day | RECTAL | Status: DC | PRN

## 2015-09-22 MED ORDER — ADULT MULTIVITAMIN W/MINERALS CH
1.0000 | ORAL_TABLET | Freq: Every day | ORAL | Status: DC
Start: 2015-09-22 — End: 2015-09-25
  Administered 2015-09-22 – 2015-09-25 (×4): 1 via ORAL
  Filled 2015-09-22 (×4): qty 1

## 2015-09-22 MED ORDER — LEVOFLOXACIN IN D5W 750 MG/150ML IV SOLN
750.0000 mg | Freq: Once | INTRAVENOUS | Status: AC
  Administered 2015-09-22: 750 mg via INTRAVENOUS
  Filled 2015-09-22: qty 150

## 2015-09-22 MED ORDER — BRIMONIDINE TARTRATE 0.15 % OP SOLN
1.0000 [drp] | Freq: Two times a day (BID) | OPHTHALMIC | Status: DC
  Administered 2015-09-22 – 2015-09-25 (×7): 1 [drp] via OPHTHALMIC
  Filled 2015-09-22: qty 5

## 2015-09-22 MED ORDER — MAGNESIUM CITRATE PO SOLN: 1.0000 | Freq: Once | ORAL | Status: DC | PRN

## 2015-09-22 MED ORDER — POTASSIUM CHLORIDE CRYS ER 20 MEQ PO TBCR
40.0000 meq | EXTENDED_RELEASE_TABLET | Freq: Once | ORAL | Status: AC
  Administered 2015-09-22: 40 meq via ORAL
  Filled 2015-09-22: qty 2

## 2015-09-22 MED ORDER — ALUM & MAG HYDROXIDE-SIMETH 200-200-20 MG/5ML PO SUSP: 30.0000 mL | Freq: Four times a day (QID) | ORAL | Status: DC | PRN

## 2015-09-22 MED ORDER — SODIUM CHLORIDE 0.9 % IV SOLN
1000.0000 mL | Freq: Once | INTRAVENOUS | Status: AC
  Administered 2015-09-22: 1000 mL via INTRAVENOUS

## 2015-09-22 MED ORDER — DOCUSATE SODIUM 100 MG PO CAPS
100.0000 mg | ORAL_CAPSULE | Freq: Two times a day (BID) | ORAL | Status: DC
  Administered 2015-09-22 – 2015-09-24 (×4): 100 mg via ORAL
  Filled 2015-09-22 (×6): qty 1

## 2015-09-22 MED ORDER — INSULIN ASPART 100 UNIT/ML ~~LOC~~ SOLN: 0.0000 [IU] | Freq: Every day | SUBCUTANEOUS | Status: DC

## 2015-09-22 MED ORDER — INSULIN ASPART 100 UNIT/ML ~~LOC~~ SOLN: 0.0000 [IU] | Freq: Three times a day (TID) | SUBCUTANEOUS | Status: DC

## 2015-09-22 MED ORDER — GUAIFENESIN-DM 100-10 MG/5ML PO SYRP: 5.0000 mL | ORAL_SOLUTION | ORAL | Status: DC | PRN

## 2015-09-22 MED ORDER — ONDANSETRON HCL 4 MG/2ML IJ SOLN: 4.0000 mg | Freq: Four times a day (QID) | INTRAMUSCULAR | Status: DC | PRN

## 2015-09-22 MED ORDER — ACETAMINOPHEN 325 MG PO TABS: 650.0000 mg | ORAL_TABLET | Freq: Four times a day (QID) | ORAL | Status: DC | PRN

## 2015-09-22 MED ORDER — ONDANSETRON HCL 4 MG PO TABS: 4.0000 mg | ORAL_TABLET | Freq: Four times a day (QID) | ORAL | Status: DC | PRN

## 2015-09-22 MED ORDER — SODIUM CHLORIDE 0.9 % IV BOLUS (SEPSIS)
1000.0000 mL | Freq: Once | INTRAVENOUS | Status: AC
  Administered 2015-09-22: 1000 mL via INTRAVENOUS

## 2015-09-22 MED ORDER — ALBUTEROL SULFATE (2.5 MG/3ML) 0.083% IN NEBU
2.5000 mg | INHALATION_SOLUTION | Freq: Four times a day (QID) | RESPIRATORY_TRACT | Status: DC
  Administered 2015-09-22 – 2015-09-23 (×3): 2.5 mg via RESPIRATORY_TRACT
  Filled 2015-09-22 (×3): qty 3

## 2015-09-22 MED ORDER — ENOXAPARIN SODIUM 40 MG/0.4ML ~~LOC~~ SOLN
40.0000 mg | SUBCUTANEOUS | Status: DC
  Administered 2015-09-22 – 2015-09-24 (×3): 40 mg via SUBCUTANEOUS
  Filled 2015-09-22 (×5): qty 0.4

## 2015-09-22 MED ORDER — AMLODIPINE BESYLATE 2.5 MG PO TABS
2.5000 mg | ORAL_TABLET | Freq: Every day | ORAL | Status: DC
  Administered 2015-09-22 – 2015-09-25 (×4): 2.5 mg via ORAL
  Filled 2015-09-22 (×4): qty 1

## 2015-09-22 MED ORDER — SODIUM CHLORIDE 0.9 % IV SOLN
1000.0000 mL | INTRAVENOUS | Status: DC
  Administered 2015-09-22: 1000 mL via INTRAVENOUS

## 2015-09-22 MED ORDER — ACETAMINOPHEN 650 MG RE SUPP: 650.0000 mg | Freq: Four times a day (QID) | RECTAL | Status: DC | PRN

## 2015-09-22 MED ORDER — ASPIRIN EC 81 MG PO TBEC
81.0000 mg | DELAYED_RELEASE_TABLET | Freq: Every day | ORAL | Status: DC
  Administered 2015-09-22 – 2015-09-25 (×4): 81 mg via ORAL
  Filled 2015-09-22 (×4): qty 1

## 2015-09-22 NOTE — ED Provider Notes (Signed)
Patient seen by Dr. Roxanne Mins last evening. Patient presented to the emergency room with fatigue altered mental status and weakness. In the ED she had a temperature up to 101.3  Laboratory tests are reassuring. Initial lactic acid level was elevated but repeat is decreasing. Urinalysis does not suggest urinary tract infection. Chest x-ray suggests the possibility of a hilar pneumonia.  I discussed the findings with the patient. She is still feeling weak all over improved. Family had to help her on the bedpan and here in the emergency department. Considering her age and possible community-acquired pneumonia on x-ray I think it is reasonable to admit her to the hospital for IV antibiotics.  Dorie Rank, MD 09/22/15 [id number]

## 2015-09-22 NOTE — ED Notes (Signed)
Patient transported to CT 

## 2015-09-22 NOTE — ED Notes (Signed)
Pt being transported upstairs by Jackelyn Poling, EMT

## 2015-09-22 NOTE — ED Notes (Signed)
Pt presents from home with [facility]; family reports patient sat up on side of bed stating she needed the restroom when she fell asleep sitting up, fell forward, and became lethargic and "not usual perky self"; EMS reports fever of 101.7 tympanic and gave 1000mg  APAP and 2105ml NS

## 2015-09-22 NOTE — ED Notes (Signed)
MD Roxanne Mins at the bedside

## 2015-09-22 NOTE — ED Notes (Signed)
Ordered lunch tray 

## 2015-09-22 NOTE — ED Notes (Signed)
RT at the bedside.

## 2015-09-22 NOTE — ED Notes (Signed)
MD approved patient could eat. Given [place] sandwich and something to drink. Family at the bedside helping patient

## 2015-09-22 NOTE — ED Notes (Signed)
MD Knapp at the bedside  

## 2015-09-22 NOTE — ED Provider Notes (Signed)
CSN: [id number]     Arrival date & time 09/22/15  0348 History   First MD Initiated Contact with Patient 09/22/15 0354     Chief Complaint  Patient presents with  . Fatigue  . Altered Mental Status  . Fall     (Consider location/radiation/quality/duration/timing/severity/associated sxs/prior Treatment) Patient is a 82 y.o. female presenting with altered mental status and fall. The history is provided by the patient, the EMS personnel and a relative.  Altered Mental Status Fall  She Has been sick for the last several days with stuffy nose and a slight cough. She has had decreased appetite today. This evening, she was sitting on the side of the bed and apparently slid off the bed she was trying to get up to go to the bathroom. She was unable to get herself off the floor. Family noted that she felt hot. EMS was called and noted temperature 101.7 and she was given acetaminophen. She denies headache and denies nausea or vomiting and denies dysuria. There are no known sick contacts.  Past Medical History  Diagnosis Date  . Glaucoma (increased eye pressure)   . Hypertension   . Neck pain   . Head pain   . Pain in joint, multiple sites   . Impaired glucose tolerance test   . Osteoarthrosis, unspecified whether generalized or localized, other specified sites   . Morbid obesity ([place])   . Encounter for long-term (current) use of other medications   . Other lymphedema   . Insomnia, unspecified   . Chronic kidney disease, stage III (moderate)   . Primary open-angle glaucoma(365.11)    Past Surgical History  Procedure Laterality Date  . Eye surgery    . Abdominal hysterectomy     Family History  Problem Relation Age of Onset  . Cancer Father    Social History  Substance Use Topics  . Smoking status: Never Smoker   . Smokeless tobacco: Never Used  . Alcohol Use: No   OB History    No data available     Review of Systems  All other systems reviewed and are  negative.     Allergies  Penicillins and Tetanus toxoid  Home Medications   Prior to Admission medications   Medication Sig Start Date End Date Taking? Authorizing Provider  ACAI PO Take 1 capsule by mouth 2 (two) times daily.    Historical Provider, MD  amLODipine (NORVASC) 2.5 MG tablet Take 2.5 mg by mouth daily.    Historical Provider, MD  aspirin EC 81 MG tablet Take 81 mg by mouth daily.    Historical Provider, MD  brimonidine (ALPHAGAN) 0.15 % ophthalmic solution 15 drops as directed. 10/29/12   Historical Provider, MD  Calcium Carbonate (OS-CAL PO) Take 1 tablet by mouth daily.    Historical Provider, MD  ibuprofen (ADVIL,MOTRIN) 800 MG tablet Take 800 mg by mouth every 8 (eight) hours as needed. For pain    Historical Provider, MD  losartan-hydrochlorothiazide (HYZAAR) 100-25 MG per tablet Take 1 tablet by mouth daily.    Historical Provider, MD  Multiple Vitamin (MULTIVITAMIN WITH MINERALS) TABS Take 1 tablet by mouth daily.    Historical Provider, MD  omeprazole (PRILOSEC) 20 MG capsule Take 20 mg by mouth daily.    Historical Provider, MD  prednisoLONE acetate (PRED FORTE) 1 % ophthalmic suspension 1 drop as directed. 10/29/12   Historical Provider, MD   BP 164/67 mmHg  Pulse 97  Temp(Src) 101.3 F (38.5 C) (Oral)  Resp 14  Ht 5' (1.524 m)  Wt 153 lb (69.4 kg)  BMI 29.88 kg/m2  SpO2 95% Physical Exam  Nursing note and vitals reviewed.  82 year old female, resting comfortably and in no acute distress. Vital signs are significant for hypertension and a fever. Oxygen saturation is 95%, which is normal. Head is normocephalic and atraumatic. PERRLA, EOMI. Oropharynx is clear. Neck is nontender and supple without adenopathy or JVD. Back is nontender and there is no CVA tenderness. Lungs are clear without rales, wheezes, or rhonchi. Chest is nontender. Heart has regular rate and rhythm without murmur. Abdomen is soft, flat, nontender without masses or hepatosplenomegaly and  peristalsis is normoactive. Extremities have 3+ lymphedema, full range of motion is present. Skin is warm and dry without rash. Neurologic: Mental status is normal, cranial nerves are intact, there are no motor or sensory deficits.  ED Course  Procedures (including critical care time) Labs Review Results for orders placed or performed during the hospital encounter of [id number]  Basic metabolic panel  Result Value Ref Range   Sodium 137 135 - 145 mmol/L   Potassium 3.4 (L) 3.5 - 5.1 mmol/L   Chloride 104 101 - 111 mmol/L   CO2 23 22 - 32 mmol/L   Glucose, Bld 157 (H) 65 - 99 mg/dL   BUN 17 6 - 20 mg/dL   Creatinine, Ser 1.17 (H) 0.44 - 1.00 mg/dL   Calcium 8.6 (L) 8.9 - 10.3 mg/dL   GFR calc non Af Amer 42 (L) >60 mL/min   GFR calc Af Amer 49 (L) >60 mL/min   Anion gap 10 5 - 15  CBC  Result Value Ref Range   WBC 3.0 (L) 4.0 - 10.5 K/uL   RBC 4.40 3.87 - 5.11 MIL/uL   Hemoglobin 11.0 (L) 12.0 - 15.0 g/dL   HCT 35.4 (L) 36.0 - 46.0 %   MCV 80.5 78.0 - 100.0 fL   MCH 25.0 (L) 26.0 - 34.0 pg   MCHC 31.1 30.0 - 36.0 g/dL   RDW 15.7 (H) 11.5 - 15.5 %   Platelets 161 150 - 400 K/uL  Urinalysis, Routine w reflex microscopic (not at [facility])  Result Value Ref Range   Color, Urine YELLOW YELLOW   APPearance CLEAR CLEAR   Specific Gravity, Urine 1.014 1.005 - 1.030   pH 5.0 5.0 - 8.0   Glucose, UA NEGATIVE NEGATIVE mg/dL   Hgb urine dipstick MODERATE (A) NEGATIVE   Bilirubin Urine NEGATIVE NEGATIVE   Ketones, ur NEGATIVE NEGATIVE mg/dL   Protein, ur NEGATIVE NEGATIVE mg/dL   Nitrite NEGATIVE NEGATIVE   Leukocytes, UA NEGATIVE NEGATIVE  Differential  Result Value Ref Range   Neutrophils Relative % 73 %   Neutro Abs 2.3 1.7 - 7.7 K/uL   Lymphocytes Relative 14 %   Lymphs Abs 0.4 (L) 0.7 - 4.0 K/uL   Monocytes Relative 13 %   Monocytes Absolute 0.4 0.1 - 1.0 K/uL   Eosinophils Relative 0 %   Eosinophils Absolute 0.0 0.0 - 0.7 K/uL   Basophils Relative 0 %   Basophils Absolute  0.0 0.0 - 0.1 K/uL  Hepatic function panel  Result Value Ref Range   Total Protein 5.9 (L) 6.5 - 8.1 g/dL   Albumin 2.8 (L) 3.5 - 5.0 g/dL   AST 61 (H) 15 - 41 U/L   ALT 22 14 - 54 U/L   Alkaline Phosphatase 84 38 - 126 U/L   Total Bilirubin 0.5 0.3 - 1.2 mg/dL   Bilirubin,  Direct 0.1 0.1 - 0.5 mg/dL   Indirect Bilirubin 0.4 0.3 - 0.9 mg/dL  Urine microscopic-add on  Result Value Ref Range   Squamous Epithelial / LPF 6-30 (A) NONE SEEN   WBC, UA 0-5 0 - 5 WBC/hpf   RBC / HPF 0-5 0 - 5 RBC/hpf   Bacteria, UA RARE (A) NONE SEEN  CBG monitoring, ED  Result Value Ref Range   Glucose-Capillary 139 (H) 65 - 99 mg/dL  I-Stat CG4 Lactic Acid, ED  Result Value Ref Range   Lactic Acid, Venous 3.24 (HH) 0.5 - 2.0 mmol/L   Comment NOTIFIED PHYSICIAN    Imaging Review Ct Head Wo Contrast  09/22/2015  CLINICAL DATA:  Fell asleep while sitting up. Fell forward. Lethargy and fever. Concern for head or cervical spine injury. Initial encounter. EXAM: CT HEAD WITHOUT CONTRAST CT CERVICAL SPINE WITHOUT CONTRAST TECHNIQUE: Multidetector CT imaging of the head and cervical spine was performed following the standard protocol without intravenous contrast. Multiplanar CT image reconstructions of the cervical spine were also generated. COMPARISON:  MRI of the brain performed 12/12/2012, CT of the head and cervical spine radiographs performed 09/04/2012 FINDINGS: CT HEAD FINDINGS There is no evidence of acute infarction, mass lesion, or intra- or extra-axial hemorrhage on CT. Mild periventricular and subcortical white matter change likely reflects small vessel ischemic microangiopathy. The posterior fossa, including the cerebellum, brainstem and fourth ventricle, is within normal limits. The third and lateral ventricles, and basal ganglia are unremarkable in appearance. The cerebral hemispheres are symmetric in appearance, with normal gray-white differentiation. No mass effect or midline shift is seen. There is no  evidence of fracture; visualized osseous structures are unremarkable in appearance. Postoperative change is noted at the orbits. The paranasal sinuses and mastoid air cells are well-aerated. No significant soft tissue abnormalities are seen. CT CERVICAL SPINE FINDINGS There is no evidence of fracture or subluxation. Reversal of the normal lordotic curvature of the cervical spine appears to be chronic in nature. Multilevel disc space narrowing is noted along the cervical spine, with scattered anterior and posterior disc osteophyte complexes. Vertebral bodies demonstrate normal height. Prevertebral soft tissues are within normal limits. The thyroid gland is unremarkable in appearance. The visualized lung apices are clear. No significant soft tissue abnormalities are seen. IMPRESSION: 1. No evidence of traumatic intracranial injury or fracture. 2. No evidence of fracture or subluxation along the cervical spine. 3. Mild small vessel ischemic microangiopathy. 4. Mild degenerative change noted along the cervical spine. Electronically Signed   By: Garald Balding M.D.   On: 09/22/2015 06:36   Ct Cervical Spine Wo Contrast  09/22/2015  CLINICAL DATA:  Fell asleep while sitting up. Fell forward. Lethargy and fever. Concern for head or cervical spine injury. Initial encounter. EXAM: CT HEAD WITHOUT CONTRAST CT CERVICAL SPINE WITHOUT CONTRAST TECHNIQUE: Multidetector CT imaging of the head and cervical spine was performed following the standard protocol without intravenous contrast. Multiplanar CT image reconstructions of the cervical spine were also generated. COMPARISON:  MRI of the brain performed 12/12/2012, CT of the head and cervical spine radiographs performed 09/04/2012 FINDINGS: CT HEAD FINDINGS There is no evidence of acute infarction, mass lesion, or intra- or extra-axial hemorrhage on CT. Mild periventricular and subcortical white matter change likely reflects small vessel ischemic microangiopathy. The posterior  fossa, including the cerebellum, brainstem and fourth ventricle, is within normal limits. The third and lateral ventricles, and basal ganglia are unremarkable in appearance. The cerebral hemispheres are symmetric in appearance, with normal  gray-white differentiation. No mass effect or midline shift is seen. There is no evidence of fracture; visualized osseous structures are unremarkable in appearance. Postoperative change is noted at the orbits. The paranasal sinuses and mastoid air cells are well-aerated. No significant soft tissue abnormalities are seen. CT CERVICAL SPINE FINDINGS There is no evidence of fracture or subluxation. Reversal of the normal lordotic curvature of the cervical spine appears to be chronic in nature. Multilevel disc space narrowing is noted along the cervical spine, with scattered anterior and posterior disc osteophyte complexes. Vertebral bodies demonstrate normal height. Prevertebral soft tissues are within normal limits. The thyroid gland is unremarkable in appearance. The visualized lung apices are clear. No significant soft tissue abnormalities are seen. IMPRESSION: 1. No evidence of traumatic intracranial injury or fracture. 2. No evidence of fracture or subluxation along the cervical spine. 3. Mild small vessel ischemic microangiopathy. 4. Mild degenerative change noted along the cervical spine. Electronically Signed   By: Garald Balding M.D.   On: 09/22/2015 06:36   Dg Chest Port 1 View  09/22/2015  CLINICAL DATA:  Acute onset of fever, fatigue and altered mental status. Initial encounter. EXAM: PORTABLE CHEST 1 VIEW COMPARISON:  Chest radiograph performed 09/13/2010 FINDINGS: The lungs are well-aerated. Right hilar prominence is noted, more apparent than on the prior study. There is no evidence of pleural effusion or pneumothorax. The cardiomediastinal silhouette is within normal limits. No acute osseous abnormalities are seen. IMPRESSION: Right hilar prominence noted, more  prominent than on the prior study. CT of the chest could be considered for further evaluation. Alternatively, if the patient has symptoms for pneumonia, could treat for pneumonia, and perform follow-up PA and lateral chest radiograph in 3-4 weeks. Electronically Signed   By: Garald Balding M.D.   On: 09/22/2015 04:56   I have personally reviewed and evaluated these images and lab results as part of my medical decision-making.   EKG Interpretation ECG shows normal sinus rhythm with a rate of 91, no ectopy. Normal axis. Normal P wave. Normal QRS. Normal intervals. Normal ST and T waves. Impression: normal ECG. No prior ECG available for comparison.    MDM   Final diagnoses:  Fever, unspecified fever cause  Fall at home, initial encounter    Fever which may represent influenza. Chest x-ray will be obtained to rule out pneumonia and urinalysis obtained to rule out urinary tract infection. Because of fall, CT of head and cervical spine will be obtained. She is started on IV hydration and cultures are obtained.Old records are reviewed and she has no relevant past visits.  Urinalysis shows no evidence of infection and chest x-ray shows no evidence of pneumonia. WBC is low suggesting viral illness. However, lactic acid level is mildly elevated. This will be repeated after IV hydration. Patient is nontoxic in appearing and I believe she can safely be discharged if lactic acid is not persistently elevated. Case is signed out to Dr. Tomi Bamberger.  Delora Fuel, MD [id number]

## 2015-09-22 NOTE — H&P (Signed)
[facility] History and Physical  [name] [id number] DOB: [birth year] DOA: 09/22/2015  Referring physician: Roxanne Mins PCP: Tula Nakayama   Chief Complaint: generalized weakness/sob/cough/fever  HPI: [name] is a 82 y.o. female with a past medical history that includes hypertension, open angle glaucoma, lower extremity edema as as to the emergency department with chief complaint generalized weakness, acute encephalopathy, persistent cough and intermittent fever. Initial evaluation in the emergency department concerning for community-acquired pneumonia, reveals elevated lactic acid and leukopenia.  Information is obtained from the family and the patient. Family reports persistent cough over the last several weeks. Last 2 days worsening shortness of breath, fall this morning associated with generalized weakness. No loss of consciousness no injury but patient was unable to get up on her own. She denies headache dizziness syncope or near-syncope. She denies chest pain palpitations abdominal pain nausea vomiting dysuria hematuria frequency or urgency. She does report some chronic constipation. He also reports chronic lower extremity edema that may or may not be worse today. Family reports increased lethargy and decreased oral intake. He MS was called and noted a temperature of 101.7 per tympanic thermometer.  In the emergency department max temperature 101.3 she is hemodynamically stable and not hypoxic. She is provided with IV fluids Levaquin.  Review of Systems:  10 point review of systems complete and all systems are negative as indicated in the history of present illness  Past Medical History  Diagnosis Date  . Glaucoma (increased eye pressure)   . Hypertension   . Neck pain   . Head pain   . Pain in joint, multiple sites   . Impaired glucose tolerance test   . Osteoarthrosis, unspecified whether generalized or localized, other specified sites   . Morbid obesity  ([place])   . Encounter for long-term (current) use of other medications   . Other lymphedema   . Insomnia, unspecified   . Chronic kidney disease, stage III (moderate)   . Primary open-angle glaucoma(365.11)    Past Surgical History  Procedure Laterality Date  . Eye surgery    . Abdominal hysterectomy     Social History:  reports that she has never smoked. She has never used smokeless tobacco. She reports that she does not drink alcohol or use illicit drugs. She lives at home with her family but is alone during the day. She uses a cane for ambulation. He is a retired Marine scientist. She is fairly independent with ADLs Allergies  Allergen Reactions  . Penicillins Hives    Has patient had a PCN reaction causing immediate rash, facial/tongue/throat swelling, SOB or lightheadedness with hypotension: No Has patient had a PCN reaction causing severe rash involving mucus membranes or skin necrosis: No Has patient had a PCN reaction that required hospitalization No Has patient had a PCN reaction occurring within the last 10 years: No If all of the above answers are "NO", then may proceed with Cephalosporin use.  . Tetanus Toxoid Hives    Family History  Problem Relation Age of Onset  . Cancer Father      Prior to Admission medications   Medication Sig Start Date End Date Taking? Authorizing Provider  ACAI PO Take 1 capsule by mouth 2 (two) times daily.   Yes Historical Provider, MD  amLODipine (NORVASC) 2.5 MG tablet Take 2.5 mg by mouth daily.   Yes Historical Provider, MD  aspirin EC 81 MG tablet Take 81 mg by mouth daily.   Yes Historical Provider, MD  brimonidine (ALPHAGAN) 0.15 %  ophthalmic solution Place 15 drops into the left eye 2 (two) times daily.  10/29/12  Yes Historical Provider, MD  Calcium Carbonate (OS-CAL PO) Take 1 tablet by mouth daily.   Yes Historical Provider, MD  GARLIC PO Take 2 capsules by mouth daily.   Yes Historical Provider, MD  ibuprofen (ADVIL,MOTRIN) 800 MG tablet Take  800 mg by mouth every 8 (eight) hours as needed. For pain   Yes Historical Provider, MD  losartan-hydrochlorothiazide (HYZAAR) 100-25 MG per tablet Take 1 tablet by mouth daily.   Yes Historical Provider, MD  Multiple Vitamin (MULTIVITAMIN WITH MINERALS) TABS Take 1 tablet by mouth daily.   Yes Historical Provider, MD  omeprazole (PRILOSEC) 20 MG capsule Take 20 mg by mouth daily.   Yes Historical Provider, MD  prednisoLONE acetate (PRED FORTE) 1 % ophthalmic suspension Place 1 drop into the left eye daily.  10/29/12  Yes Historical Provider, MD   Physical Exam: Filed Vitals:   09/22/15 0930 09/22/15 1000 09/22/15 1015 09/22/15 1049  BP: 136/57 135/57 143/61   Pulse: 85 70 80   Temp:      TempSrc:      Resp: 23 17 26    Height:      Weight:      SpO2: 93% 98% 99% 99%    Wt Readings from Last 3 Encounters:  09/22/15 69.4 kg (153 lb)  09/09/15 73.936 kg (163 lb)  07/08/15 73.936 kg (163 lb)    General:  Appears calm and comfortable, obese Eyes: PERRL, normal lids, irises & conjunctiva some clear drainage particularly from right eye no erythema or swelling ENT: grossly normal hearing, lips & tongue, very poor dentition Neck: no LAD, masses or thyromegaly Cardiovascular: RRR, no m/r/g 3+ pitting lower extremity edema up to knees  Respiratory: Mild increased work of breathing with conversation. Breath sounds are diminished throughout. Scattered rhonchi as well. Faint end expiratory wheeze no crackles Abdomen: soft, ntnd, obese positive bowel sounds no guarding or rebounding Skin: no rash or induration seen on limited exam Musculoskeletal: grossly normal tone BUE/BLE Psychiatric: grossly normal mood and affect, speech fluent and appropriate Neurologic: grossly non-focal. Speech clear facial symmetry cranial nerves II through XII grossly intact           Labs on Admission:  Basic Metabolic Panel:  Recent Labs Lab 09/22/15 0400  NA 137  K 3.4*  CL 104  CO2 23  GLUCOSE 157*    BUN 17  CREATININE 1.17*  CALCIUM 8.6*   Liver Function Tests:  Recent Labs Lab 09/22/15 0505  AST 61*  ALT 22  ALKPHOS 84  BILITOT 0.5  PROT 5.9*  ALBUMIN 2.8*   No results for input(s): LIPASE, AMYLASE in the last 168 hours. No results for input(s): AMMONIA in the last 168 hours. CBC:  Recent Labs Lab 09/22/15 0400  WBC 3.0*  NEUTROABS 2.3  HGB 11.0*  HCT 35.4*  MCV 80.5  PLT 161   Cardiac Enzymes: No results for input(s): CKTOTAL, CKMB, CKMBINDEX, TROPONINI in the last 168 hours.  BNP (last 3 results) No results for input(s): BNP in the last 8760 hours.  ProBNP (last 3 results) No results for input(s): PROBNP in the last 8760 hours.  CBG:  Recent Labs Lab 09/22/15 0437  GLUCAP 139*    Radiological Exams on Admission: Ct Head Wo Contrast  09/22/2015  CLINICAL DATA:  Fell asleep while sitting up. Fell forward. Lethargy and fever. Concern for head or cervical spine injury. Initial encounter. EXAM: CT  HEAD WITHOUT CONTRAST CT CERVICAL SPINE WITHOUT CONTRAST TECHNIQUE: Multidetector CT imaging of the head and cervical spine was performed following the standard protocol without intravenous contrast. Multiplanar CT image reconstructions of the cervical spine were also generated. COMPARISON:  MRI of the brain performed 12/12/2012, CT of the head and cervical spine radiographs performed 09/04/2012 FINDINGS: CT HEAD FINDINGS There is no evidence of acute infarction, mass lesion, or intra- or extra-axial hemorrhage on CT. Mild periventricular and subcortical white matter change likely reflects small vessel ischemic microangiopathy. The posterior fossa, including the cerebellum, brainstem and fourth ventricle, is within normal limits. The third and lateral ventricles, and basal ganglia are unremarkable in appearance. The cerebral hemispheres are symmetric in appearance, with normal gray-white differentiation. No mass effect or midline shift is seen. There is no evidence of  fracture; visualized osseous structures are unremarkable in appearance. Postoperative change is noted at the orbits. The paranasal sinuses and mastoid air cells are well-aerated. No significant soft tissue abnormalities are seen. CT CERVICAL SPINE FINDINGS There is no evidence of fracture or subluxation. Reversal of the normal lordotic curvature of the cervical spine appears to be chronic in nature. Multilevel disc space narrowing is noted along the cervical spine, with scattered anterior and posterior disc osteophyte complexes. Vertebral bodies demonstrate normal height. Prevertebral soft tissues are within normal limits. The thyroid gland is unremarkable in appearance. The visualized lung apices are clear. No significant soft tissue abnormalities are seen. IMPRESSION: 1. No evidence of traumatic intracranial injury or fracture. 2. No evidence of fracture or subluxation along the cervical spine. 3. Mild small vessel ischemic microangiopathy. 4. Mild degenerative change noted along the cervical spine. Electronically Signed   By: Garald Balding M.D.   On: 09/22/2015 06:36   Ct Cervical Spine Wo Contrast  09/22/2015  CLINICAL DATA:  Fell asleep while sitting up. Fell forward. Lethargy and fever. Concern for head or cervical spine injury. Initial encounter. EXAM: CT HEAD WITHOUT CONTRAST CT CERVICAL SPINE WITHOUT CONTRAST TECHNIQUE: Multidetector CT imaging of the head and cervical spine was performed following the standard protocol without intravenous contrast. Multiplanar CT image reconstructions of the cervical spine were also generated. COMPARISON:  MRI of the brain performed 12/12/2012, CT of the head and cervical spine radiographs performed 09/04/2012 FINDINGS: CT HEAD FINDINGS There is no evidence of acute infarction, mass lesion, or intra- or extra-axial hemorrhage on CT. Mild periventricular and subcortical white matter change likely reflects small vessel ischemic microangiopathy. The posterior fossa,  including the cerebellum, brainstem and fourth ventricle, is within normal limits. The third and lateral ventricles, and basal ganglia are unremarkable in appearance. The cerebral hemispheres are symmetric in appearance, with normal gray-white differentiation. No mass effect or midline shift is seen. There is no evidence of fracture; visualized osseous structures are unremarkable in appearance. Postoperative change is noted at the orbits. The paranasal sinuses and mastoid air cells are well-aerated. No significant soft tissue abnormalities are seen. CT CERVICAL SPINE FINDINGS There is no evidence of fracture or subluxation. Reversal of the normal lordotic curvature of the cervical spine appears to be chronic in nature. Multilevel disc space narrowing is noted along the cervical spine, with scattered anterior and posterior disc osteophyte complexes. Vertebral bodies demonstrate normal height. Prevertebral soft tissues are within normal limits. The thyroid gland is unremarkable in appearance. The visualized lung apices are clear. No significant soft tissue abnormalities are seen. IMPRESSION: 1. No evidence of traumatic intracranial injury or fracture. 2. No evidence of fracture or subluxation along  the cervical spine. 3. Mild small vessel ischemic microangiopathy. 4. Mild degenerative change noted along the cervical spine. Electronically Signed   By: Garald Balding M.D.   On: 09/22/2015 06:36   Dg Chest Port 1 View  09/22/2015  CLINICAL DATA:  Acute onset of fever, fatigue and altered mental status. Initial encounter. EXAM: PORTABLE CHEST 1 VIEW COMPARISON:  Chest radiograph performed 09/13/2010 FINDINGS: The lungs are well-aerated. Right hilar prominence is noted, more apparent than on the prior study. There is no evidence of pleural effusion or pneumothorax. The cardiomediastinal silhouette is within normal limits. No acute osseous abnormalities are seen. IMPRESSION: Right hilar prominence noted, more prominent  than on the prior study. CT of the chest could be considered for further evaluation. Alternatively, if the patient has symptoms for pneumonia, could treat for pneumonia, and perform follow-up PA and lateral chest radiograph in 3-4 weeks. Electronically Signed   By: Garald Balding M.D.   On: 09/22/2015 04:56    EKG: pending  Assessment/Plan Principal Problem:   CAP (community acquired pneumonia) Active Problems:   Hypertension   Morbid obesity ([place])   Chronic kidney disease, stage III (moderate)   Generalized weakness   Acidosis   Acute encephalopathy   Hyperglycemia   Leukopenia   Bilateral leg edema   Constipation  #1. Community-acquired pneumonia. Patient is febrile with an elevated lactic acid. She is hemodynamically stable and not hypoxic and lactic acid trending down on admission. She has a leukopenia. Chest x-ray with right hilar prominence concerning for pneumonia. -Admit to medical floor -Trend lactic acid -IV fluids -Blood cultures -Sputum cultures as able -Levaquin per pharmacy -strep pneumo urine antigen -Robitussin -nebulizers every 6 hours  #2. Acute encephalopathy. Mild and resolving on admission. Exam benign. Likely related to above -Gentle IV fluids -We'll obtain vitamin B-12 folate RPR -Monitor  3. Generalized weakness. Likely related to above -Physical therapy  #4. Chronic kidney disease stage III. Creatinine 1.17 on admission -Hold nephrotoxins -Monitor urine output  #5. Hyperglycemia. No history of diabetes but does have a history of glucose intolerance -Obtain a hemoglobin A1c -Sliding scale insulin for optimal control  6. Bilateral lower extremity edema. Chronic. Chart review indicates venous stasis. No history of heart failure -Obtain echocardiogram -Daily weights -Intake and output  7. Hypertension. Stable on admission. Home medications include amlodipine and losartan hydrochlorothiazide. -Hold losartan hydrochlorothiazide for  now -Monitor  #8. Constipation.  - daily stool softner -consider enema  #9. Leukopenia   Code Status: full DVT Prophylaxis: Family Communication: son at bedside Disposition Plan: home when ready  Time spent: 40 minutes  [facility]

## 2015-09-22 NOTE — ED Notes (Signed)
Admitting MD at the bedside.  

## 2015-09-22 NOTE — ED Notes (Signed)
CBG 116  

## 2015-09-23 ENCOUNTER — Ambulatory Visit: Payer: Medicare HMO | Admitting: Pulmonary Disease

## 2015-09-23 ENCOUNTER — Ambulatory Visit (HOSPITAL_COMMUNITY): Payer: Medicare HMO

## 2015-09-23 ENCOUNTER — Observation Stay (HOSPITAL_COMMUNITY): Payer: Medicare HMO

## 2015-09-23 DIAGNOSIS — G9340 Encephalopathy, unspecified: Secondary | ICD-10-CM

## 2015-09-23 DIAGNOSIS — D72819 Decreased white blood cell count, unspecified: Secondary | ICD-10-CM | POA: Diagnosis not present

## 2015-09-23 DIAGNOSIS — N183 Chronic kidney disease, stage 3 (moderate): Secondary | ICD-10-CM

## 2015-09-23 DIAGNOSIS — R600 Localized edema: Secondary | ICD-10-CM | POA: Diagnosis not present

## 2015-09-23 DIAGNOSIS — I10 Essential (primary) hypertension: Secondary | ICD-10-CM | POA: Diagnosis not present

## 2015-09-23 DIAGNOSIS — J189 Pneumonia, unspecified organism: Secondary | ICD-10-CM | POA: Diagnosis not present

## 2015-09-23 DIAGNOSIS — R531 Weakness: Secondary | ICD-10-CM

## 2015-09-23 DIAGNOSIS — K5900 Constipation, unspecified: Secondary | ICD-10-CM | POA: Diagnosis not present

## 2015-09-23 LAB — CBC
HCT: 34.8 % — ABNORMAL LOW (ref 36.0–46.0)
Hemoglobin: 10.8 g/dL — ABNORMAL LOW (ref 12.0–15.0)
MCH: 25.4 pg — AB (ref 26.0–34.0)
MCHC: 31 g/dL (ref 30.0–36.0)
MCV: 81.7 fL (ref 78.0–100.0)
PLATELETS: 152 10*3/uL (ref 150–400)
RBC: 4.26 MIL/uL (ref 3.87–5.11)
RDW: 16.1 % — AB (ref 11.5–15.5)
WBC: 3.7 10*3/uL — ABNORMAL LOW (ref 4.0–10.5)

## 2015-09-23 LAB — HEMOGLOBIN A1C
HEMOGLOBIN A1C: 6.3 % — AB (ref 4.8–5.6)
Mean Plasma Glucose: 134 mg/dL

## 2015-09-23 LAB — GLUCOSE, CAPILLARY
GLUCOSE-CAPILLARY: 125 mg/dL — AB (ref 65–99)
Glucose-Capillary: 88 mg/dL (ref 65–99)
Glucose-Capillary: 96 mg/dL (ref 65–99)
Glucose-Capillary: 88 mg/dL (ref 65–99)
Glucose-Capillary: 117 mg/dL — ABNORMAL HIGH (ref 65–99)

## 2015-09-23 LAB — BASIC METABOLIC PANEL
Anion gap: 8 (ref 5–15)
BUN: 11 mg/dL (ref 6–20)
CALCIUM: 8 mg/dL — AB (ref 8.9–10.3)
CO2: 24 mmol/L (ref 22–32)
CREATININE: 1.08 mg/dL — AB (ref 0.44–1.00)
Chloride: 109 mmol/L (ref 101–111)
GFR calc non Af Amer: 46 mL/min — ABNORMAL LOW (ref >60)
GFR, EST AFRICAN AMERICAN: 53 mL/min — AB (ref >60)
GLUCOSE: 133 mg/dL — AB (ref 65–99)
Potassium: 3.8 mmol/L (ref 3.5–5.1)
Sodium: 141 mmol/L (ref 135–145)

## 2015-09-23 LAB — RPR: RPR Ser Ql: NONREACTIVE

## 2015-09-23 LAB — FOLATE RBC
FOLATE, HEMOLYSATE: 377.2 ng/mL
Folate, RBC: 1161 ng/mL (ref >498)
HEMATOCRIT: 32.5 % — AB (ref 34.0–46.6)

## 2015-09-23 LAB — URINE CULTURE: CULTURE: NO GROWTH

## 2015-09-23 MED ORDER — BENZONATATE 100 MG PO CAPS
200.0000 mg | ORAL_CAPSULE | Freq: Three times a day (TID) | ORAL | Status: DC | PRN
  Administered 2015-09-23 – 2015-09-25 (×2): 200 mg via ORAL
  Filled 2015-09-23 (×2): qty 2

## 2015-09-23 MED ORDER — ALBUTEROL SULFATE (2.5 MG/3ML) 0.083% IN NEBU: 2.5000 mg | INHALATION_SOLUTION | Freq: Four times a day (QID) | RESPIRATORY_TRACT | Status: DC | PRN

## 2015-09-23 MED ORDER — OSELTAMIVIR PHOSPHATE 30 MG PO CAPS
30.0000 mg | ORAL_CAPSULE | Freq: Two times a day (BID) | ORAL | Status: DC
  Administered 2015-09-23 – 2015-09-25 (×4): 30 mg via ORAL
  Filled 2015-09-23 (×4): qty 1

## 2015-09-23 MED ORDER — GUAIFENESIN ER 600 MG PO TB12
600.0000 mg | ORAL_TABLET | Freq: Two times a day (BID) | ORAL | Status: DC
  Administered 2015-09-23 – 2015-09-25 (×5): 600 mg via ORAL
  Filled 2015-09-23 (×5): qty 1

## 2015-09-23 NOTE — Progress Notes (Signed)
  Echocardiogram 2D Echocardiogram has been performed.  [name] M 09/23/2015, 5:13 PM

## 2015-09-23 NOTE — Care Management Obs Status (Signed)
[place] NOTIFICATION   Patient Details  Name: HERMONIE RASMUSSON MRN: [id number] Date of Birth: February 05, 1930   Medicare Observation Status Notification Given:  Yes  Explained to patient and grand daughter . Grand daughter signed.   Marilu Favre, RN 09/23/2015, 1:38 PM

## 2015-09-23 NOTE — Care Management Note (Signed)
Case Management Note  Patient Details  Name: [name] MRN: [id number] Date of Birth: April 27, 1930  Subjective/Objective:                    Action/Plan:  Discussed home health with patient and grand daughter at bedside . Confirmed face sheet information . Provided list of agencies . Patient and grand daughter will discuss with family and then decide on agency . Will continue to follow.  Expected Discharge Date:                  Expected Discharge Plan:  [place]  In-House Referral:     Discharge planning Services  CM Consult  Post Acute Care Choice:  Home Health, Durable Medical Equipment Choice offered to:  Patient  DME Arranged:  Walker rolling DME Agency:  [place] Arranged:  PT, OT [facility] Agency:     Status of Service:  In process, will continue to follow  Medicare Important Message Given:    Date Medicare IM Given:    Medicare IM give by:    Date Additional Medicare IM Given:    Additional Medicare Important Message give by:     If discussed at [place] of Stay Meetings, dates discussed:    Additional Comments:  Marilu Favre, RN 09/23/2015, 1:39 PM

## 2015-09-23 NOTE — Evaluation (Signed)
Physical Therapy Evaluation Patient Details Name: [name] MRN: [id number] DOB: 10/25/1929 Today's Date: 09/23/2015   History of Present Illness  82 y.o. female with a Past Medical History of glaucoma, HTN, diabetes, C KD who presents with severe acquired pneumonia and acute encephalopathy  Clinical Impression  Pt admitted with above diagnosis. Pt currently with functional limitations due to the deficits listed below (see PT Problem List).  Pt will benefit from skilled PT to increase their independence and safety with mobility to allow discharge to the venue listed below. Patient with improved gait stability when using rw, recommended use with pt and granddaughter. Additionally, recommended supervision for mobility due to instability with gait and increased fall risk. Anticipate pt will D/C to home with family support, PT to continue to follow to progress mobility and independence.        Follow Up Recommendations Home health PT;Supervision for mobility/OOB    Equipment Recommendations  Rolling walker with 5" wheels    Recommendations for Other Services       Precautions / Restrictions Precautions Precautions: Fall Restrictions Weight Bearing Restrictions: No      Mobility  Bed Mobility Overal bed mobility: Needs Assistance Bed Mobility: Supine to Sit     Supine to sit: Supervision     General bed mobility comments: using rail to assist  Transfers Overall transfer level: Needs assistance Equipment used: None Transfers: Sit to/from Stand Sit to Stand: Min guard         General transfer comment: min guard for safety, patient moving slowly and mild instability with standing   Ambulation/Gait Ambulation/Gait assistance: Min guard Ambulation Distance (Feet): 50 Feet (25 feet X2) Assistive device: Rolling walker (2 wheeled);None Gait Pattern/deviations: Step-through pattern;Decreased step length - left;Decreased step length - right;Trunk flexed Gait velocity:  decreased   General Gait Details: Ambulating 25 feet without assistive device, patient reaching for objects in room for stability. Repeat ambulation with rw with improved stability.   Stairs Stairs:  (pt declined attempting steps at this time. )          Wheelchair Mobility    Modified Rankin (Stroke Patients Only)       Balance Overall balance assessment: Needs assistance Sitting-balance support: No upper extremity supported Sitting balance-Leahy Scale: Fair     Standing balance support: Single extremity supported Standing balance-Leahy Scale: Poor Standing balance comment: instability noted without UE support                             Pertinent Vitals/Pain Pain Assessment: No/denies pain    Home Living Family/patient expects to be discharged to:: Private residence Living Arrangements: Children Available Help at Discharge: Family;Available PRN/intermittently Type of Home: House Home Access: Stairs to enter Entrance Stairs-Rails: Right Entrance Stairs-Number of Steps: 3 Home Layout: One level Home Equipment: Cane - single point Additional Comments: Pt lives with her daughter but she is alone during the day. She reports that she did occasionally use a cane.      Prior Function Level of Independence: Independent         Comments: independent with mobility     Hand Dominance        Extremity/Trunk Assessment   Upper Extremity Assessment: Generalized weakness           Lower Extremity Assessment: Generalized weakness         Communication   Communication: No difficulties  Cognition Arousal/Alertness: Awake/alert Behavior During Therapy: WFL for  tasks assessed/performed Overall Cognitive Status: Within Functional Limits for tasks assessed                      General Comments      Exercises        Assessment/Plan    PT Assessment Patient needs continued PT services  PT Diagnosis Difficulty walking;Generalized  weakness   PT Problem List Decreased strength;Decreased activity tolerance;Decreased balance;Decreased mobility;Decreased knowledge of use of DME  PT Treatment Interventions DME instruction;Gait training;Stair training;Functional mobility training;Therapeutic activities;Therapeutic exercise;Patient/family education   PT Goals (Current goals can be found in the Care Plan section) Acute Rehab PT Goals Patient Stated Goal: go home PT Goal Formulation: With patient Time For Goal Achievement: 10/07/15 Potential to Achieve Goals: Good    Frequency Min 3X/week   Barriers to discharge Decreased caregiver support      Co-evaluation               End of Session Equipment Utilized During Treatment: Gait belt Activity Tolerance: Patient limited by fatigue Patient left: in chair;with call bell/phone within reach;with nursing/sitter in room;with family/visitor present Nurse Communication: Mobility status    Functional Assessment Tool Used: clinical judgment Functional Limitation: Mobility: Walking and moving around Mobility: Walking and Moving Around Current Status [phone]): At least 40 percent but less than 60 percent impaired, limited or restricted Mobility: Walking and Moving Around Goal Status [phone]): At least 20 percent but less than 40 percent impaired, limited or restricted    Time: 0830-0858 PT Time Calculation (min) (ACUTE ONLY): 28 min   Charges:   PT Evaluation $PT Eval Moderate Complexity: 1 Procedure PT Treatments $Gait Training: 8-22 mins   PT G Codes:   PT G-Codes **NOT FOR INPATIENT CLASS** Functional Assessment Tool Used: clinical judgment Functional Limitation: Mobility: Walking and moving around Mobility: Walking and Moving Around Current Status [id number]): At least 40 percent but less than 60 percent impaired, limited or restricted Mobility: Walking and Moving Around Goal Status [phone]): At least 20 percent but less than 40 percent impaired, limited or restricted     Cassell Clement, PT, [place] Pager [phone] Office [phone]  09/23/2015, 9:26 AM

## 2015-09-23 NOTE — Progress Notes (Signed)
PATIENT DETAILS Name: [name] Age: 82 y.o. Sex: female Date of Birth: 02-01-1930 Admit Date: 09/22/2015 Admitting Physician Waldemar Dickens, MD [id number], PA-C  Subjective: Claims to be feeling much better than yesterday. Continues to cough. She is completely awake and alert today.  Assessment/Plan: Principal Problem: Acute influenza with bronchitis: Improving, continue Tamiflu and bronchodilators. All antibiotics have been discontinued. Blood cultures pending.  Active Problems: Acute encephalopathy: Resolved, likely secondary to acute influenza.   Leukopenia: Suspect secondary to influenza, likely will improve with time.   Generalized weakness/deconditioning: Secondary to influenza, seen by physical therapy-recommendations are for home health services.   AKI: suspect mild prerenal azotemia from dehydration/influenza. Much improved with gentle hydration. Follow periodically.  Chronic bilateral lower extremity edema: Per patient this is chronic-and essentially unchanged. Suspect amlodipine playing a role. Await echo-but do not think patient has decompensated CHF. Resume HCTZ in the next few days.  Right hilar prominence: Seen on x-ray chest, will get CT chest while inpatient for further evaluation.   Hypertension: Relatively well controlled on amlodipine, resume HCTZ and losartan in the next few days   Mild hyperglycemia: A1c 6.3-monitor off medications for now. Repeat A1c next 3 months. Encourage lifestyle/dietary modifications.   Constipation: Had BM this morning, start MiraLAX.  Disposition: Remain inpatient-home in 1-2 days   Antimicrobial agents  See below  Anti-infectives    Start     Dose/Rate Route Frequency Ordered Stop   09/24/15 1200  levofloxacin (LEVAQUIN) IVPB 750 mg  Status:  Discontinued     750 mg 100 mL/hr over 90 Minutes Intravenous Every 48 hours 09/22/15 1045 09/22/15 1550   09/23/15 2200  oseltamivir (TAMIFLU)  capsule 30 mg     30 mg Oral 2 times daily 09/23/15 1109 09/27/15 2159   09/22/15 1600  oseltamivir (TAMIFLU) capsule 75 mg  Status:  Discontinued     75 mg Oral 2 times daily 09/22/15 1550 09/23/15 1109   09/22/15 1045  levofloxacin (LEVAQUIN) IVPB 750 mg  Status:  Discontinued     750 mg 100 mL/hr over 90 Minutes Intravenous Every 24 hours 09/22/15 1041 09/22/15 1045   09/22/15 1015  levofloxacin (LEVAQUIN) IVPB 750 mg     750 mg 100 mL/hr over 90 Minutes Intravenous  Once 09/22/15 1009 09/22/15 1238      DVT Prophylaxis: Prophylactic Lovenox  Code Status: Full code  Family Communication Grand daughter at bedside  Procedures: None  CONSULTS:  None  Time spent 30 minutes-Greater than 50% of this time was spent in counseling, explanation of diagnosis, planning of further management, and coordination of care.  MEDICATIONS: Scheduled Meds: . amLODipine  2.5 mg Oral Daily  . aspirin EC  81 mg Oral Daily  . brimonidine  1 drop Left Eye BID  . docusate sodium  100 mg Oral BID  . enoxaparin (LOVENOX) injection  40 mg Subcutaneous Q24H  . guaiFENesin  600 mg Oral BID  . insulin aspart  0-15 Units Subcutaneous TID WC  . insulin aspart  0-5 Units Subcutaneous QHS  . multivitamin with minerals  1 tablet Oral Daily  . oseltamivir  30 mg Oral BID  . pantoprazole  40 mg Oral Daily  . prednisoLONE acetate  1 drop Left Eye Daily   Continuous Infusions:  PRN Meds:.acetaminophen **OR** acetaminophen, albuterol, alum & mag hydroxide-simeth, benzonatate, bisacodyl, magnesium citrate, ondansetron **OR** ondansetron (ZOFRAN) IV    PHYSICAL EXAM: Vital  signs in last 24 hours: Filed Vitals:   09/23/15 0154 09/23/15 0446 09/23/15 0825 09/23/15 1038  BP: 134/86 154/68 159/73 152/66  Pulse: 94 85 88   Temp: 98.5 F (36.9 C) 98.6 F (37 C) 98.5 F (36.9 C)   TempSrc: Oral Oral Oral   Resp: 20 20 19    Height:      Weight:      SpO2: 99% 97% 94%     Weight change:  Filed  Weights   09/22/15 0420  Weight: 69.4 kg (153 lb)   Body mass index is 29.88 kg/(m^2).   Gen Exam: Awake and alert with clear speech.  Neck: Supple, No JVD.  Chest: B/L Clear.   CVS: S1 S2 Regular, no murmurs.  Abdomen: soft, BS +, non tender, non distended.  Extremities: + edema, lower extremities warm to touch. Neurologic: Non Focal.   Skin: No Rash.   Wounds: N/A.   Intake/Output from previous day:  Intake/Output Summary (Last 24 hours) at 09/23/15 1112 Last data filed at 09/23/15 0730  Gross per 24 hour  Intake    240 ml  Output   1125 ml  Net   -885 ml     LAB RESULTS: CBC  Recent Labs Lab 09/22/15 0400 09/23/15 0346  WBC 3.0* 3.7*  HGB 11.0* 10.8*  HCT 35.4* 34.8*  PLT 161 152  MCV 80.5 81.7  MCH 25.0* 25.4*  MCHC 31.1 31.0  RDW 15.7* 16.1*  LYMPHSABS 0.4*  --   MONOABS 0.4  --   EOSABS 0.0  --   BASOSABS 0.0  --     Chemistries   Recent Labs Lab 09/22/15 0400 09/22/15 1313 09/23/15 0346  NA 137  --  141  K 3.4*  --  3.8  CL 104  --  109  CO2 23  --  24  GLUCOSE 157*  --  133*  BUN 17  --  11  CREATININE 1.17*  --  1.08*  CALCIUM 8.6*  --  8.0*  MG  --  1.6*  --     CBG:  Recent Labs Lab 09/22/15 0437 09/22/15 1205 09/22/15 1700 09/22/15 2145 09/23/15 0715  GLUCAP 139* 116* 88 125* 88    GFR Estimated Creatinine Clearance: 34.3 mL/min (by C-G formula based on Cr of 1.08).  Coagulation profile No results for input(s): INR, PROTIME in the last 168 hours.  Cardiac Enzymes No results for input(s): CKMB, TROPONINI, MYOGLOBIN in the last 168 hours.  Invalid input(s): CK  Invalid input(s): POCBNP No results for input(s): DDIMER in the last 72 hours.  Recent Labs  09/22/15 1313  HGBA1C 6.3*   No results for input(s): CHOL, HDL, LDLCALC, TRIG, CHOLHDL, LDLDIRECT in the last 72 hours. No results for input(s): TSH, T4TOTAL, T3FREE, THYROIDAB in the last 72 hours.  Invalid input(s): FREET3  Recent Labs  09/22/15 1313    VITAMINB12 1290*   No results for input(s): LIPASE, AMYLASE in the last 72 hours.  Urine Studies No results for input(s): UHGB, CRYS in the last 72 hours.  Invalid input(s): UACOL, UAPR, USPG, UPH, UTP, UGL, UKET, UBIL, UNIT, UROB, ULEU, UEPI, UWBC, URBC, UBAC, CAST, UCOM, BILUA  MICROBIOLOGY: No results found for this or any previous visit (from the past 240 hour(s)).  RADIOLOGY STUDIES/RESULTS: Ct Head Wo Contrast  09/22/2015  CLINICAL DATA:  Fell asleep while sitting up. Fell forward. Lethargy and fever. Concern for head or cervical spine injury. Initial encounter. EXAM: CT HEAD WITHOUT CONTRAST CT CERVICAL  SPINE WITHOUT CONTRAST TECHNIQUE: Multidetector CT imaging of the head and cervical spine was performed following the standard protocol without intravenous contrast. Multiplanar CT image reconstructions of the cervical spine were also generated. COMPARISON:  MRI of the brain performed 12/12/2012, CT of the head and cervical spine radiographs performed 09/04/2012 FINDINGS: CT HEAD FINDINGS There is no evidence of acute infarction, mass lesion, or intra- or extra-axial hemorrhage on CT. Mild periventricular and subcortical white matter change likely reflects small vessel ischemic microangiopathy. The posterior fossa, including the cerebellum, brainstem and fourth ventricle, is within normal limits. The third and lateral ventricles, and basal ganglia are unremarkable in appearance. The cerebral hemispheres are symmetric in appearance, with normal gray-white differentiation. No mass effect or midline shift is seen. There is no evidence of fracture; visualized osseous structures are unremarkable in appearance. Postoperative change is noted at the orbits. The paranasal sinuses and mastoid air cells are well-aerated. No significant soft tissue abnormalities are seen. CT CERVICAL SPINE FINDINGS There is no evidence of fracture or subluxation. Reversal of the normal lordotic curvature of the cervical  spine appears to be chronic in nature. Multilevel disc space narrowing is noted along the cervical spine, with scattered anterior and posterior disc osteophyte complexes. Vertebral bodies demonstrate normal height. Prevertebral soft tissues are within normal limits. The thyroid gland is unremarkable in appearance. The visualized lung apices are clear. No significant soft tissue abnormalities are seen. IMPRESSION: 1. No evidence of traumatic intracranial injury or fracture. 2. No evidence of fracture or subluxation along the cervical spine. 3. Mild small vessel ischemic microangiopathy. 4. Mild degenerative change noted along the cervical spine. Electronically Signed   By: Garald Balding M.D.   On: 09/22/2015 06:36   Ct Cervical Spine Wo Contrast  09/22/2015  CLINICAL DATA:  Fell asleep while sitting up. Fell forward. Lethargy and fever. Concern for head or cervical spine injury. Initial encounter. EXAM: CT HEAD WITHOUT CONTRAST CT CERVICAL SPINE WITHOUT CONTRAST TECHNIQUE: Multidetector CT imaging of the head and cervical spine was performed following the standard protocol without intravenous contrast. Multiplanar CT image reconstructions of the cervical spine were also generated. COMPARISON:  MRI of the brain performed 12/12/2012, CT of the head and cervical spine radiographs performed 09/04/2012 FINDINGS: CT HEAD FINDINGS There is no evidence of acute infarction, mass lesion, or intra- or extra-axial hemorrhage on CT. Mild periventricular and subcortical white matter change likely reflects small vessel ischemic microangiopathy. The posterior fossa, including the cerebellum, brainstem and fourth ventricle, is within normal limits. The third and lateral ventricles, and basal ganglia are unremarkable in appearance. The cerebral hemispheres are symmetric in appearance, with normal gray-white differentiation. No mass effect or midline shift is seen. There is no evidence of fracture; visualized osseous structures are  unremarkable in appearance. Postoperative change is noted at the orbits. The paranasal sinuses and mastoid air cells are well-aerated. No significant soft tissue abnormalities are seen. CT CERVICAL SPINE FINDINGS There is no evidence of fracture or subluxation. Reversal of the normal lordotic curvature of the cervical spine appears to be chronic in nature. Multilevel disc space narrowing is noted along the cervical spine, with scattered anterior and posterior disc osteophyte complexes. Vertebral bodies demonstrate normal height. Prevertebral soft tissues are within normal limits. The thyroid gland is unremarkable in appearance. The visualized lung apices are clear. No significant soft tissue abnormalities are seen. IMPRESSION: 1. No evidence of traumatic intracranial injury or fracture. 2. No evidence of fracture or subluxation along the cervical spine. 3. Mild  small vessel ischemic microangiopathy. 4. Mild degenerative change noted along the cervical spine. Electronically Signed   By: Garald Balding M.D.   On: 09/22/2015 06:36   Dg Chest Port 1 View  09/22/2015  CLINICAL DATA:  Acute onset of fever, fatigue and altered mental status. Initial encounter. EXAM: PORTABLE CHEST 1 VIEW COMPARISON:  Chest radiograph performed 09/13/2010 FINDINGS: The lungs are well-aerated. Right hilar prominence is noted, more apparent than on the prior study. There is no evidence of pleural effusion or pneumothorax. The cardiomediastinal silhouette is within normal limits. No acute osseous abnormalities are seen. IMPRESSION: Right hilar prominence noted, more prominent than on the prior study. CT of the chest could be considered for further evaluation. Alternatively, if the patient has symptoms for pneumonia, could treat for pneumonia, and perform follow-up PA and lateral chest radiograph in 3-4 weeks. Electronically Signed   By: Garald Balding M.D.   On: 09/22/2015 04:56    Oren Binet, MD  [facility] Pager:[phone]  [phone]  If 7PM-7AM, please contact night-coverage [email or web address] Password [email or web address] 09/23/2015, 11:12 AM

## 2015-09-24 DIAGNOSIS — K5900 Constipation, unspecified: Secondary | ICD-10-CM

## 2015-09-24 DIAGNOSIS — J111 Influenza due to unidentified influenza virus with other respiratory manifestations: Secondary | ICD-10-CM

## 2015-09-24 DIAGNOSIS — D72819 Decreased white blood cell count, unspecified: Secondary | ICD-10-CM

## 2015-09-24 DIAGNOSIS — G9340 Encephalopathy, unspecified: Secondary | ICD-10-CM | POA: Diagnosis not present

## 2015-09-24 DIAGNOSIS — R600 Localized edema: Secondary | ICD-10-CM | POA: Diagnosis not present

## 2015-09-24 LAB — GLUCOSE, CAPILLARY: Glucose-Capillary: 81 mg/dL (ref 65–99)

## 2015-09-24 MED ORDER — POLYETHYLENE GLYCOL 3350 17 G PO PACK
17.0000 g | PACK | Freq: Every day | ORAL | Status: DC
  Administered 2015-09-24: 17 g via ORAL
  Filled 2015-09-24 (×2): qty 1

## 2015-09-24 NOTE — Progress Notes (Signed)
Physical Therapy Treatment Patient Details Name: [name] MRN: [id number] DOB: [birth year] Today's Date: 09/24/2015    History of Present Illness 82 y.o. female with a Past Medical History of glaucoma, HTN, diabetes, C KD who presents with severe acquired pneumonia and acute encephalopathy    PT Comments    Patient is making good progress with PT and has met goals.  From a mobility standpoint anticipate patient will be ready for DC home when medically ready.     Follow Up Recommendations  Home health PT;Supervision for mobility/OOB     Equipment Recommendations  Rolling walker with 5" wheels    Recommendations for Other Services       Precautions / Restrictions Precautions Precautions: Fall Restrictions Weight Bearing Restrictions: No    Mobility  Bed Mobility               General bed mobility comments: OOB in chair upon arrival  Transfers Overall transfer level: Needs assistance Equipment used: Rolling walker (2 wheeled) Transfers: Sit to/from Stand Sit to Stand: Supervision         General transfer comment: supervision for safety with carry over of hand placement; from [facility] and recliner chair  Ambulation/Gait Ambulation/Gait assistance: Supervision Ambulation Distance (Feet): 185 Feet Assistive device: Rolling walker (2 wheeled);None Gait Pattern/deviations: Step-through pattern;Decreased stride length;Trunk flexed Gait velocity: decreased   General Gait Details: vc for position of RW and posture; slow, steady gait; safe use of AD and no unsteadiness noted   Stairs Stairs: Yes Stairs assistance: Min guard Stair Management: One rail Right;Forwards Number of Stairs: 10 General stair comments: min guard for safety; pt with good safety awareness  Wheelchair Mobility    Modified Rankin (Stroke Patients Only)       Balance Overall balance assessment: Needs assistance Sitting-balance support: No upper extremity supported;Feet  supported Sitting balance-Leahy Scale: Fair     Standing balance support: During functional activity;Single extremity supported Standing balance-Leahy Scale: Poor Standing balance comment: pt able to perform hand hygiene with single UE support with no LOB                    Cognition Arousal/Alertness: Awake/alert Behavior During Therapy: WFL for tasks assessed/performed Overall Cognitive Status: Within Functional Limits for tasks assessed                      Exercises      General Comments        Pertinent Vitals/Pain Pain Assessment: No/denies pain    Home Living                      Prior Function            PT Goals (current goals can now be found in the care plan section) Acute Rehab PT Goals Patient Stated Goal: go home PT Goal Formulation: With patient Time For Goal Achievement: 10/07/15 Potential to Achieve Goals: Good Progress towards PT goals: Goals met and updated - see care plan    Frequency  Min 3X/week    PT Plan Current plan remains appropriate    Co-evaluation             End of Session Equipment Utilized During Treatment: Gait belt Activity Tolerance: Patient tolerated treatment well Patient left: in chair;with call bell/phone within reach;with family/visitor present     Time: 4098-1191 PT Time Calculation (min) (ACUTE ONLY): 23 min  Charges:  $Gait Training: 23-37 mins  G Codes:  Functional Assessment Tool Used: clinical judgment Functional Limitation: Mobility: Walking and moving around Mobility: Walking and Moving Around Current Status [phone]): At least 40 percent but less than 60 percent impaired, limited or restricted Mobility: Walking and Moving Around Goal Status [phone]): At least 20 percent but less than 40 percent impaired, limited or restricted   Salina April, PTA Pager: [phone]   09/24/2015, 4:00 PM

## 2015-09-24 NOTE — Care Management Note (Signed)
Case Management Note  Patient Details  Name: [name] MRN: [id number] Date of Birth: [birth year]  Subjective/Objective:                    Action/Plan:  Spoke with patient and grand daughter , they decided on [place] referral given to [facility] with [facility]  Expected Discharge Date:                  Expected Discharge Plan:  [place]  In-House Referral:     Discharge planning Services  CM Consult  Post Acute Care Choice:  Home Health, Durable Medical Equipment Choice offered to:  Patient  DME Arranged:  Walker rolling DME Agency:  [place] Arranged:  PT, OT [facility] Agency:  [place]  Status of Service:  Completed, signed off  Medicare Important Message Given:    Date Medicare IM Given:    Medicare IM give by:    Date Additional Medicare IM Given:    Additional Medicare Important Message give by:     If discussed at [place] of Stay Meetings, dates discussed:    Additional Comments:  Marilu Favre, RN 09/24/2015, 11:27 AM

## 2015-09-24 NOTE — Progress Notes (Signed)
PATIENT DETAILS Name: [name] Age: 82 y.o. Sex: female Date of Birth: 01/26/1930 Admit Date: 09/22/2015 Admitting Physician Waldemar Dickens, MD [id number], PA-C  Subjective: Claims to be feeling much better than yesterday. Less cough.  Assessment/Plan: Principal Problem: Acute influenza with bronchitis: Much improved, clear lungs today. Cough better. Afebrile. Continue Tamiflu and bronchodilators. Blood cultures negative  Active Problems: Acute encephalopathy: Resolved, likely secondary to acute influenza.   Leukopenia: Suspect secondary to influenza, likely will improve with time.   Generalized weakness/deconditioning: Secondary to influenza, seen by physical therapy-recommendations are for home health services.   AKI: suspect mild prerenal azotemia from dehydration/influenza. Much improved with gentle hydration. Follow periodically.  Chronic bilateral lower extremity edema: Per patient this is chronic-and essentially unchanged. Echocardiogram shows pulmonary hypertension-suspect this is the cause of lower extremity edema, amlodipine could be playing a role as well. Suspect amlodipine playing a role. Resume HCTZ in the next few days.  Right hilar prominence: Seen on x-ray chest, CT chest shows a large right pulmonary artery and pulmonary venous confluence accounting for findings on the chest x-ray.  Hypertension: Relatively well controlled on amlodipine, resume HCTZ and losartan in the next few days   Mild hyperglycemia: A1c 6.3-monitor off medications for now. Repeat A1c next 3 months. Encourage lifestyle/dietary modifications.   Constipation: Had BM yesterday, continue MiraLAX.  Disposition: Remain inpatient-home tomorrow  Antimicrobial agents  See below  Anti-infectives    Start     Dose/Rate Route Frequency Ordered Stop   09/24/15 1200  levofloxacin (LEVAQUIN) IVPB 750 mg  Status:  Discontinued     750 mg 100 mL/hr over 90 Minutes  Intravenous Every 48 hours 09/22/15 1045 09/22/15 1550   09/23/15 2200  oseltamivir (TAMIFLU) capsule 30 mg     30 mg Oral 2 times daily 09/23/15 1109 09/27/15 2159   09/22/15 1600  oseltamivir (TAMIFLU) capsule 75 mg  Status:  Discontinued     75 mg Oral 2 times daily 09/22/15 1550 09/23/15 1109   09/22/15 1045  levofloxacin (LEVAQUIN) IVPB 750 mg  Status:  Discontinued     750 mg 100 mL/hr over 90 Minutes Intravenous Every 24 hours 09/22/15 1041 09/22/15 1045   09/22/15 1015  levofloxacin (LEVAQUIN) IVPB 750 mg     750 mg 100 mL/hr over 90 Minutes Intravenous  Once 09/22/15 1009 09/22/15 1238      DVT Prophylaxis: Prophylactic Lovenox  Code Status: Full code  Family Communication Daughter at bedside  Procedures: None  CONSULTS:  None  Time spent 30 minutes-Greater than 50% of this time was spent in counseling, explanation of diagnosis, planning of further management, and coordination of care.  MEDICATIONS: Scheduled Meds: . amLODipine  2.5 mg Oral Daily  . aspirin EC  81 mg Oral Daily  . brimonidine  1 drop Left Eye BID  . docusate sodium  100 mg Oral BID  . enoxaparin (LOVENOX) injection  40 mg Subcutaneous Q24H  . guaiFENesin  600 mg Oral BID  . multivitamin with minerals  1 tablet Oral Daily  . oseltamivir  30 mg Oral BID  . pantoprazole  40 mg Oral Daily  . prednisoLONE acetate  1 drop Left Eye Daily   Continuous Infusions:  PRN Meds:.acetaminophen **OR** acetaminophen, albuterol, alum & mag hydroxide-simeth, benzonatate, bisacodyl, magnesium citrate, ondansetron **OR** ondansetron (ZOFRAN) IV    PHYSICAL EXAM: Vital signs in last 24 hours: Filed Vitals:   09/23/15  1400 09/23/15 2116 09/24/15 0427 09/24/15 1347  BP: 155/61 147/64 160/73 146/70  Pulse: 68 75 65 76  Temp: 98.4 F (36.9 C) 97.8 F (36.6 C) 97.5 F (36.4 C) 97.8 F (36.6 C)  TempSrc: Oral Oral Oral Oral  Resp: 20 18 18 18   Height:      Weight:      SpO2: 99% 99% 99% 100%    Weight  change:  Filed Weights   09/22/15 0420  Weight: 69.4 kg (153 lb)   Body mass index is 29.88 kg/(m^2).   Gen Exam: Awake and alert with clear speech.  Neck: Supple, No JVD.  Chest: B/L Clear.   CVS: S1 S2 Regular, no murmurs.  Abdomen: soft, BS +, non tender, non distended.  Extremities: + edema, lower extremities warm to touch. Neurologic: Non Focal.   Skin: No Rash.   Wounds: N/A.   Intake/Output from previous day:  Intake/Output Summary (Last 24 hours) at 09/24/15 1358 Last data filed at 09/24/15 1346  Gross per 24 hour  Intake    840 ml  Output   3075 ml  Net  -2235 ml     LAB RESULTS: CBC  Recent Labs Lab 09/22/15 0400 09/22/15 1313 09/23/15 0346  WBC 3.0*  --  3.7*  HGB 11.0*  --  10.8*  HCT 35.4* 32.5* 34.8*  PLT 161  --  152  MCV 80.5  --  81.7  MCH 25.0*  --  25.4*  MCHC 31.1  --  31.0  RDW 15.7*  --  16.1*  LYMPHSABS 0.4*  --   --   MONOABS 0.4  --   --   EOSABS 0.0  --   --   BASOSABS 0.0  --   --     Chemistries   Recent Labs Lab 09/22/15 0400 09/22/15 1313 09/23/15 0346  NA 137  --  141  K 3.4*  --  3.8  CL 104  --  109  CO2 23  --  24  GLUCOSE 157*  --  133*  BUN 17  --  11  CREATININE 1.17*  --  1.08*  CALCIUM 8.6*  --  8.0*  MG  --  1.6*  --     CBG:  Recent Labs Lab 09/23/15 0715 09/23/15 1137 09/23/15 1704 09/23/15 2133 09/24/15 0735  GLUCAP 88 96 88 117* 81    GFR Estimated Creatinine Clearance: 34.3 mL/min (by C-G formula based on Cr of 1.08).  Coagulation profile No results for input(s): INR, PROTIME in the last 168 hours.  Cardiac Enzymes No results for input(s): CKMB, TROPONINI, MYOGLOBIN in the last 168 hours.  Invalid input(s): CK  Invalid input(s): POCBNP No results for input(s): DDIMER in the last 72 hours.  Recent Labs  09/22/15 1313  HGBA1C 6.3*   No results for input(s): CHOL, HDL, LDLCALC, TRIG, CHOLHDL, LDLDIRECT in the last 72 hours. No results for input(s): TSH, T4TOTAL, T3FREE,  THYROIDAB in the last 72 hours.  Invalid input(s): FREET3  Recent Labs  09/22/15 1313  VITAMINB12 1290*   No results for input(s): LIPASE, AMYLASE in the last 72 hours.  Urine Studies No results for input(s): UHGB, CRYS in the last 72 hours.  Invalid input(s): UACOL, UAPR, USPG, UPH, UTP, UGL, UKET, UBIL, UNIT, UROB, ULEU, UEPI, UWBC, URBC, UBAC, CAST, UCOM, BILUA  MICROBIOLOGY: Recent Results (from the past 240 hour(s))  Culture, blood (routine x 2)     Status: None (Preliminary result)   Collection Time: 09/22/15  4:50 AM  Result Value Ref Range Status   Specimen Description BLOOD RIGHT FOREARM  Final   Special Requests BOTTLES DRAWN AEROBIC AND ANAEROBIC 4ML  Final   Culture NO GROWTH 1 DAY  Final   Report Status PENDING  Incomplete  Culture, blood (routine x 2)     Status: None (Preliminary result)   Collection Time: 09/22/15  5:05 AM  Result Value Ref Range Status   Specimen Description BLOOD LEFT HAND  Final   Special Requests BOTTLES DRAWN AEROBIC AND ANAEROBIC 5ML  Final   Culture NO GROWTH 1 DAY  Final   Report Status PENDING  Incomplete  Urine culture     Status: None   Collection Time: 09/22/15  7:00 AM  Result Value Ref Range Status   Specimen Description URINE, CATHETERIZED  Final   Special Requests NONE  Final   Culture NO GROWTH 1 DAY  Final   Report Status 09/23/2015 FINAL  Final    RADIOLOGY STUDIES/RESULTS: Ct Head Wo Contrast  09/22/2015  CLINICAL DATA:  Fell asleep while sitting up. Fell forward. Lethargy and fever. Concern for head or cervical spine injury. Initial encounter. EXAM: CT HEAD WITHOUT CONTRAST CT CERVICAL SPINE WITHOUT CONTRAST TECHNIQUE: Multidetector CT imaging of the head and cervical spine was performed following the standard protocol without intravenous contrast. Multiplanar CT image reconstructions of the cervical spine were also generated. COMPARISON:  MRI of the brain performed 12/12/2012, CT of the head and cervical spine  radiographs performed 09/04/2012 FINDINGS: CT HEAD FINDINGS There is no evidence of acute infarction, mass lesion, or intra- or extra-axial hemorrhage on CT. Mild periventricular and subcortical white matter change likely reflects small vessel ischemic microangiopathy. The posterior fossa, including the cerebellum, brainstem and fourth ventricle, is within normal limits. The third and lateral ventricles, and basal ganglia are unremarkable in appearance. The cerebral hemispheres are symmetric in appearance, with normal gray-white differentiation. No mass effect or midline shift is seen. There is no evidence of fracture; visualized osseous structures are unremarkable in appearance. Postoperative change is noted at the orbits. The paranasal sinuses and mastoid air cells are well-aerated. No significant soft tissue abnormalities are seen. CT CERVICAL SPINE FINDINGS There is no evidence of fracture or subluxation. Reversal of the normal lordotic curvature of the cervical spine appears to be chronic in nature. Multilevel disc space narrowing is noted along the cervical spine, with scattered anterior and posterior disc osteophyte complexes. Vertebral bodies demonstrate normal height. Prevertebral soft tissues are within normal limits. The thyroid gland is unremarkable in appearance. The visualized lung apices are clear. No significant soft tissue abnormalities are seen. IMPRESSION: 1. No evidence of traumatic intracranial injury or fracture. 2. No evidence of fracture or subluxation along the cervical spine. 3. Mild small vessel ischemic microangiopathy. 4. Mild degenerative change noted along the cervical spine. Electronically Signed   By: Garald Balding M.D.   On: 09/22/2015 06:36   Ct Chest Wo Contrast  09/23/2015  CLINICAL DATA:  82 year old female with right hilar prominence on CT. Chest congestion and cough. EXAM: CT CHEST WITHOUT CONTRAST TECHNIQUE: Multidetector CT imaging of the chest was performed following  the standard protocol without IV contrast. COMPARISON:  Chest radiographs dating back to 10/07/2003. FINDINGS: Mediastinum/Nodes: The right hilar prominence on recent chest radiograph represents enlarged right pulmonary artery and pulmonary venous confluence. No definite mass or adenopathy noted. Cardiomegaly is present. There is no evidence of pericardial effusion. Lungs/Pleura: Minimal scarring/ atelectasis in the left lower lobe noted. No  suspicious nodule, mass, endobronchial or endotracheal lesion or pneumothorax noted. A trace right pleural effusion is noted. Upper abdomen: No acute abnormalities Musculoskeletal: No acute or suspicious abnormalities. Degenerative disc disease identified. IMPRESSION: Enlarged right pulmonary artery and pulmonary venous confluence accounts for the recent chest radiograph findings. No definite mass or adenopathy. Cardiomegaly and enlarged pulmonary arteries/pulmonary arterial hypertension. Trace right pleural effusion. Electronically Signed   By: Margarette Canada M.D.   On: 09/23/2015 13:29   Ct Cervical Spine Wo Contrast  09/22/2015  CLINICAL DATA:  Fell asleep while sitting up. Fell forward. Lethargy and fever. Concern for head or cervical spine injury. Initial encounter. EXAM: CT HEAD WITHOUT CONTRAST CT CERVICAL SPINE WITHOUT CONTRAST TECHNIQUE: Multidetector CT imaging of the head and cervical spine was performed following the standard protocol without intravenous contrast. Multiplanar CT image reconstructions of the cervical spine were also generated. COMPARISON:  MRI of the brain performed 12/12/2012, CT of the head and cervical spine radiographs performed 09/04/2012 FINDINGS: CT HEAD FINDINGS There is no evidence of acute infarction, mass lesion, or intra- or extra-axial hemorrhage on CT. Mild periventricular and subcortical white matter change likely reflects small vessel ischemic microangiopathy. The posterior fossa, including the cerebellum, brainstem and fourth  ventricle, is within normal limits. The third and lateral ventricles, and basal ganglia are unremarkable in appearance. The cerebral hemispheres are symmetric in appearance, with normal gray-white differentiation. No mass effect or midline shift is seen. There is no evidence of fracture; visualized osseous structures are unremarkable in appearance. Postoperative change is noted at the orbits. The paranasal sinuses and mastoid air cells are well-aerated. No significant soft tissue abnormalities are seen. CT CERVICAL SPINE FINDINGS There is no evidence of fracture or subluxation. Reversal of the normal lordotic curvature of the cervical spine appears to be chronic in nature. Multilevel disc space narrowing is noted along the cervical spine, with scattered anterior and posterior disc osteophyte complexes. Vertebral bodies demonstrate normal height. Prevertebral soft tissues are within normal limits. The thyroid gland is unremarkable in appearance. The visualized lung apices are clear. No significant soft tissue abnormalities are seen. IMPRESSION: 1. No evidence of traumatic intracranial injury or fracture. 2. No evidence of fracture or subluxation along the cervical spine. 3. Mild small vessel ischemic microangiopathy. 4. Mild degenerative change noted along the cervical spine. Electronically Signed   By: Garald Balding M.D.   On: 09/22/2015 06:36   Dg Chest Port 1 View  09/22/2015  CLINICAL DATA:  Acute onset of fever, fatigue and altered mental status. Initial encounter. EXAM: PORTABLE CHEST 1 VIEW COMPARISON:  Chest radiograph performed 09/13/2010 FINDINGS: The lungs are well-aerated. Right hilar prominence is noted, more apparent than on the prior study. There is no evidence of pleural effusion or pneumothorax. The cardiomediastinal silhouette is within normal limits. No acute osseous abnormalities are seen. IMPRESSION: Right hilar prominence noted, more prominent than on the prior study. CT of the chest could  be considered for further evaluation. Alternatively, if the patient has symptoms for pneumonia, could treat for pneumonia, and perform follow-up PA and lateral chest radiograph in 3-4 weeks. Electronically Signed   By: Garald Balding M.D.   On: 09/22/2015 04:56    Oren Binet, MD  [facility] Pager:[phone]  If 7PM-7AM, please contact night-coverage [email or web address] Password [email or web address] 09/24/2015, 1:58 PM

## 2015-09-25 DIAGNOSIS — I10 Essential (primary) hypertension: Secondary | ICD-10-CM | POA: Diagnosis not present

## 2015-09-25 DIAGNOSIS — K5900 Constipation, unspecified: Secondary | ICD-10-CM | POA: Diagnosis not present

## 2015-09-25 DIAGNOSIS — R600 Localized edema: Secondary | ICD-10-CM | POA: Diagnosis not present

## 2015-09-25 DIAGNOSIS — G9340 Encephalopathy, unspecified: Secondary | ICD-10-CM | POA: Diagnosis not present

## 2015-09-25 MED ORDER — GUAIFENESIN ER 600 MG PO TB12: 600.0000 mg | ORAL_TABLET | Freq: Two times a day (BID) | ORAL | Status: DC

## 2015-09-25 MED ORDER — OSELTAMIVIR PHOSPHATE 30 MG PO CAPS: 30.0000 mg | ORAL_CAPSULE | Freq: Two times a day (BID) | ORAL | Status: DC

## 2015-09-25 MED ORDER — BENZONATATE 200 MG PO CAPS: 200.0000 mg | ORAL_CAPSULE | Freq: Three times a day (TID) | ORAL | Status: DC | PRN

## 2015-09-25 MED ORDER — POLYETHYLENE GLYCOL 3350 17 G PO PACK: 17.0000 g | PACK | Freq: Every day | ORAL | Status: DC

## 2015-09-25 NOTE — Progress Notes (Signed)
CM received call pt still has not received rolling walker put in on the 23rd.  Cm called [place] D[employer] rep, Merry Proud to please deliver the rolling walker so pt can discharge.  CM notified [place] rep Tiffany pt to discharge.  No Other CM needs were communicated.

## 2015-09-25 NOTE — Discharge Summary (Signed)
PATIENT DETAILS Name: [name] Age: 82 y.o. Sex: female Date of Birth: 08-07-1929 MRN: [id number]. Admitting Physician: Waldemar Dickens, MD [id number], PA-C  Admit Date: 09/22/2015 Discharge date: 09/25/2015  Recommendations for Outpatient Follow-up:  1. Ensure follow up with Dr Henderson Newcomer Pul HTN  2. Please repeat CBC/BMET at next visit 3. Please follow blood cultures till final  PRIMARY DISCHARGE DIAGNOSIS:  Principal Problem:  Influenza Active Problems:   Hypertension   Morbid obesity ([place])   Chronic kidney disease, stage III (moderate)   Generalized weakness   Acidosis   Acute encephalopathy   Hyperglycemia   Leukopenia   Bilateral leg edema   Constipation      PAST MEDICAL HISTORY: Past Medical History  Diagnosis Date  . Glaucoma (increased eye pressure)   . Hypertension   . Neck pain   . Head pain   . Pain in joint, multiple sites   . Impaired glucose tolerance test   . Osteoarthrosis, unspecified whether generalized or localized, other specified sites   . Morbid obesity ([place])   . Encounter for long-term (current) use of other medications   . Other lymphedema   . Insomnia, unspecified   . Primary open-angle glaucoma(365.11)   . CAP (community acquired pneumonia) 09/21/2015  . Chronic kidney disease, stage III (moderate)     DISCHARGE MEDICATIONS: Current Discharge Medication List    START taking these medications   Details  benzonatate (TESSALON) 200 MG capsule Take 1 capsule (200 mg total) by mouth 3 (three) times daily as needed for cough. Qty: 20 capsule, Refills: 0    guaiFENesin (MUCINEX) 600 MG 12 hr tablet Take 1 tablet (600 mg total) by mouth 2 (two) times daily. Qty: 14 tablet, Refills: 0    oseltamivir (TAMIFLU) 30 MG capsule Take 1 capsule (30 mg total) by mouth 2 (two) times daily. Qty: 4 capsule, Refills: 0    polyethylene glycol (MIRALAX / GLYCOLAX) packet Take 17 g by mouth daily. Qty: 14 each, Refills: 0        CONTINUE these medications which have NOT CHANGED   Details  ACAI PO Take 1 capsule by mouth 2 (two) times daily.    amLODipine (NORVASC) 2.5 MG tablet Take 2.5 mg by mouth daily.    aspirin EC 81 MG tablet Take 81 mg by mouth daily.    brimonidine (ALPHAGAN) 0.15 % ophthalmic solution Place 15 drops into the left eye 2 (two) times daily.     Calcium Carbonate (OS-CAL PO) Take 1 tablet by mouth daily.    GARLIC PO Take 2 capsules by mouth daily.    ibuprofen (ADVIL,MOTRIN) 800 MG tablet Take 800 mg by mouth every 8 (eight) hours as needed. For pain    losartan-hydrochlorothiazide (HYZAAR) 100-25 MG per tablet Take 1 tablet by mouth daily.    Multiple Vitamin (MULTIVITAMIN WITH MINERALS) TABS Take 1 tablet by mouth daily.    omeprazole (PRILOSEC) 20 MG capsule Take 20 mg by mouth daily.    prednisoLONE acetate (PRED FORTE) 1 % ophthalmic suspension Place 1 drop into the left eye daily.         ALLERGIES:   Allergies  Allergen Reactions  . Penicillins Hives    Has patient had a PCN reaction causing immediate rash, facial/tongue/throat swelling, SOB or lightheadedness with hypotension: No Has patient had a PCN reaction causing severe rash involving mucus membranes or skin necrosis: No Has patient had a PCN reaction that required hospitalization No Has patient had a PCN  reaction occurring within the last 10 years: No If all of the above answers are "NO", then may proceed with Cephalosporin use.  . Tetanus Toxoid Hives    BRIEF HPI:  See H&P, Labs, Consult and Test reports for all details in brief, patient was admitted for evaluation of cough, fever and gen weakness.  CONSULTATIONS:   None  PERTINENT RADIOLOGIC STUDIES: Ct Head Wo Contrast  09/22/2015  CLINICAL DATA:  Fell asleep while sitting up. Fell forward. Lethargy and fever. Concern for head or cervical spine injury. Initial encounter. EXAM: CT HEAD WITHOUT CONTRAST CT CERVICAL SPINE WITHOUT CONTRAST TECHNIQUE:  Multidetector CT imaging of the head and cervical spine was performed following the standard protocol without intravenous contrast. Multiplanar CT image reconstructions of the cervical spine were also generated. COMPARISON:  MRI of the brain performed 12/12/2012, CT of the head and cervical spine radiographs performed 09/04/2012 FINDINGS: CT HEAD FINDINGS There is no evidence of acute infarction, mass lesion, or intra- or extra-axial hemorrhage on CT. Mild periventricular and subcortical white matter change likely reflects small vessel ischemic microangiopathy. The posterior fossa, including the cerebellum, brainstem and fourth ventricle, is within normal limits. The third and lateral ventricles, and basal ganglia are unremarkable in appearance. The cerebral hemispheres are symmetric in appearance, with normal gray-white differentiation. No mass effect or midline shift is seen. There is no evidence of fracture; visualized osseous structures are unremarkable in appearance. Postoperative change is noted at the orbits. The paranasal sinuses and mastoid air cells are well-aerated. No significant soft tissue abnormalities are seen. CT CERVICAL SPINE FINDINGS There is no evidence of fracture or subluxation. Reversal of the normal lordotic curvature of the cervical spine appears to be chronic in nature. Multilevel disc space narrowing is noted along the cervical spine, with scattered anterior and posterior disc osteophyte complexes. Vertebral bodies demonstrate normal height. Prevertebral soft tissues are within normal limits. The thyroid gland is unremarkable in appearance. The visualized lung apices are clear. No significant soft tissue abnormalities are seen. IMPRESSION: 1. No evidence of traumatic intracranial injury or fracture. 2. No evidence of fracture or subluxation along the cervical spine. 3. Mild small vessel ischemic microangiopathy. 4. Mild degenerative change noted along the cervical spine. Electronically  Signed   By: Garald Balding M.D.   On: 09/22/2015 06:36   Ct Chest Wo Contrast  09/23/2015  CLINICAL DATA:  82 year old female with right hilar prominence on CT. Chest congestion and cough. EXAM: CT CHEST WITHOUT CONTRAST TECHNIQUE: Multidetector CT imaging of the chest was performed following the standard protocol without IV contrast. COMPARISON:  Chest radiographs dating back to 10/07/2003. FINDINGS: Mediastinum/Nodes: The right hilar prominence on recent chest radiograph represents enlarged right pulmonary artery and pulmonary venous confluence. No definite mass or adenopathy noted. Cardiomegaly is present. There is no evidence of pericardial effusion. Lungs/Pleura: Minimal scarring/ atelectasis in the left lower lobe noted. No suspicious nodule, mass, endobronchial or endotracheal lesion or pneumothorax noted. A trace right pleural effusion is noted. Upper abdomen: No acute abnormalities Musculoskeletal: No acute or suspicious abnormalities. Degenerative disc disease identified. IMPRESSION: Enlarged right pulmonary artery and pulmonary venous confluence accounts for the recent chest radiograph findings. No definite mass or adenopathy. Cardiomegaly and enlarged pulmonary arteries/pulmonary arterial hypertension. Trace right pleural effusion. Electronically Signed   By: Margarette Canada M.D.   On: 09/23/2015 13:29   Ct Cervical Spine Wo Contrast  09/22/2015  CLINICAL DATA:  Fell asleep while sitting up. Fell forward. Lethargy and fever. Concern for head or  cervical spine injury. Initial encounter. EXAM: CT HEAD WITHOUT CONTRAST CT CERVICAL SPINE WITHOUT CONTRAST TECHNIQUE: Multidetector CT imaging of the head and cervical spine was performed following the standard protocol without intravenous contrast. Multiplanar CT image reconstructions of the cervical spine were also generated. COMPARISON:  MRI of the brain performed 12/12/2012, CT of the head and cervical spine radiographs performed 09/04/2012 FINDINGS: CT  HEAD FINDINGS There is no evidence of acute infarction, mass lesion, or intra- or extra-axial hemorrhage on CT. Mild periventricular and subcortical white matter change likely reflects small vessel ischemic microangiopathy. The posterior fossa, including the cerebellum, brainstem and fourth ventricle, is within normal limits. The third and lateral ventricles, and basal ganglia are unremarkable in appearance. The cerebral hemispheres are symmetric in appearance, with normal gray-white differentiation. No mass effect or midline shift is seen. There is no evidence of fracture; visualized osseous structures are unremarkable in appearance. Postoperative change is noted at the orbits. The paranasal sinuses and mastoid air cells are well-aerated. No significant soft tissue abnormalities are seen. CT CERVICAL SPINE FINDINGS There is no evidence of fracture or subluxation. Reversal of the normal lordotic curvature of the cervical spine appears to be chronic in nature. Multilevel disc space narrowing is noted along the cervical spine, with scattered anterior and posterior disc osteophyte complexes. Vertebral bodies demonstrate normal height. Prevertebral soft tissues are within normal limits. The thyroid gland is unremarkable in appearance. The visualized lung apices are clear. No significant soft tissue abnormalities are seen. IMPRESSION: 1. No evidence of traumatic intracranial injury or fracture. 2. No evidence of fracture or subluxation along the cervical spine. 3. Mild small vessel ischemic microangiopathy. 4. Mild degenerative change noted along the cervical spine. Electronically Signed   By: Garald Balding M.D.   On: 09/22/2015 06:36   Dg Chest Port 1 View  09/22/2015  CLINICAL DATA:  Acute onset of fever, fatigue and altered mental status. Initial encounter. EXAM: PORTABLE CHEST 1 VIEW COMPARISON:  Chest radiograph performed 09/13/2010 FINDINGS: The lungs are well-aerated. Right hilar prominence is noted, more  apparent than on the prior study. There is no evidence of pleural effusion or pneumothorax. The cardiomediastinal silhouette is within normal limits. No acute osseous abnormalities are seen. IMPRESSION: Right hilar prominence noted, more prominent than on the prior study. CT of the chest could be considered for further evaluation. Alternatively, if the patient has symptoms for pneumonia, could treat for pneumonia, and perform follow-up PA and lateral chest radiograph in 3-4 weeks. Electronically Signed   By: Garald Balding M.D.   On: 09/22/2015 04:56     PERTINENT LAB RESULTS: CBC:  Recent Labs  09/22/15 1313 09/23/15 0346  WBC  --  3.7*  HGB  --  10.8*  HCT 32.5* 34.8*  PLT  --  152   CMET CMP     Component Value Date/Time   NA 141 09/23/2015 0346   K 3.8 09/23/2015 0346   CL 109 09/23/2015 0346   CO2 24 09/23/2015 0346   GLUCOSE 133* 09/23/2015 0346   BUN 11 09/23/2015 0346   CREATININE 1.08* 09/23/2015 0346   CALCIUM 8.0* 09/23/2015 0346   PROT 5.9* 09/22/2015 0505   ALBUMIN 2.8* 09/22/2015 0505   AST 61* 09/22/2015 0505   ALT 22 09/22/2015 0505   ALKPHOS 84 09/22/2015 0505   BILITOT 0.5 09/22/2015 0505   GFRNONAA 46* 09/23/2015 0346   GFRAA 53* 09/23/2015 0346    GFR Estimated Creatinine Clearance: 34.3 mL/min (by C-G formula based on Cr  of 1.08). No results for input(s): LIPASE, AMYLASE in the last 72 hours. No results for input(s): CKTOTAL, CKMB, CKMBINDEX, TROPONINI in the last 72 hours. Invalid input(s): POCBNP No results for input(s): DDIMER in the last 72 hours.  Recent Labs  09/22/15 1313  HGBA1C 6.3*   No results for input(s): CHOL, HDL, LDLCALC, TRIG, CHOLHDL, LDLDIRECT in the last 72 hours. No results for input(s): TSH, T4TOTAL, T3FREE, THYROIDAB in the last 72 hours.  Invalid input(s): FREET3  Recent Labs  09/22/15 1313  VITAMINB12 1290*   Coags: No results for input(s): INR in the last 72 hours.  Invalid input(s):  PT Microbiology: Recent Results (from the past 240 hour(s))  Culture, blood (routine x 2)     Status: None (Preliminary result)   Collection Time: 09/22/15  4:50 AM  Result Value Ref Range Status   Specimen Description BLOOD RIGHT FOREARM  Final   Special Requests BOTTLES DRAWN AEROBIC AND ANAEROBIC 4ML  Final   Culture NO GROWTH 2 DAYS  Final   Report Status PENDING  Incomplete  Culture, blood (routine x 2)     Status: None (Preliminary result)   Collection Time: 09/22/15  5:05 AM  Result Value Ref Range Status   Specimen Description BLOOD LEFT HAND  Final   Special Requests BOTTLES DRAWN AEROBIC AND ANAEROBIC 5ML  Final   Culture NO GROWTH 2 DAYS  Final   Report Status PENDING  Incomplete  Urine culture     Status: None   Collection Time: 09/22/15  7:00 AM  Result Value Ref Range Status   Specimen Description URINE, CATHETERIZED  Final   Special Requests NONE  Final   Culture NO GROWTH 1 DAY  Final   Report Status 09/23/2015 FINAL  Final     BRIEF HOSPITAL COURSE:  Acute influenza with bronchitis: Much improved, clear lungs today. Cough better. Afebrile. Continue Tamiflu and bronchodilators. Blood cultures negative. Stable for discharge, follow up with PCP in 1 week.  Active Problems: Acute encephalopathy: Resolved, likely secondary to acute influenza.   Leukopenia: Suspect secondary to influenza, likely will improve with time. Repeat CBC on follow up with PCP  Generalized weakness/deconditioning: Secondary to influenza, seen by physical therapy-recommendations are for home health services.   AKI: suspect mild prerenal azotemia from dehydration/influenza. Much improved with gentle hydration.Repeat BMET on follow up with PCP  Chronic bilateral lower extremity edema: Per patient this is chronic-and essentially unchanged. Echocardiogram shows pulmonary hypertension-suspect this is the cause of lower extremity edema, amlodipine could be playing a role as well. Suspect amlodipine  playing a role. Resume HCTZ on discharge.Spoke with patient's primary cardiologist Dr Terrence Dupont over the phone-he recommended follow up with him in the office  Right hilar prominence: Seen on x-ray chest, CT chest shows a large right pulmonary artery and pulmonary venous confluence accounting for findings on the chest x-ray.  Hypertension: Relatively well controlled on amlodipine, resume HCTZ and losartan on discharge  Mild hyperglycemia: A1c 6.3-monitor off medications for now. Repeat A1c next 3 months. Encourage lifestyle/dietary modifications.   Constipation: Had BM yesterday, continue MiraLAX.   TODAY-DAY OF DISCHARGE:  Subjective:   [name] today has no headache,no chest abdominal pain,no new weakness tingling or numbness, feels much better wants to go home today.   Objective:   Blood pressure 163/73, pulse 87, temperature 98.7 F (37.1 C), temperature source Oral, resp. rate 18, height 5' (1.524 m), weight 69.4 kg (153 lb), SpO2 98 %.  Intake/Output Summary (Last 24 hours) at  09/25/15 1030 Last data filed at 09/25/15 0527  Gross per 24 hour  Intake    960 ml  Output   2350 ml  Net  -1390 ml   Filed Weights   09/22/15 0420  Weight: 69.4 kg (153 lb)    Exam Awake Alert, Oriented *3, No new F.N deficits, Normal affect [place].AT,PERRAL Supple Neck,No JVD, No cervical lymphadenopathy appriciated.  Symmetrical Chest wall movement, Good air movement bilaterally, CTAB RRR,No Gallops,Rubs or new Murmurs, No Parasternal Heave +ve B.Sounds, Abd Soft, Non tender, No organomegaly appriciated, No rebound -guarding or rigidity. No Cyanosis, Clubbing or edema, No new Rash or bruise  DISCHARGE CONDITION: Stable  DISPOSITION: Home with home health services  DISCHARGE INSTRUCTIONS:    Activity:  As tolerated with Full fall precautions use walker/cane & assistance as needed  Get Medicines reviewed and adjusted: Please take all your medications with you for your next visit with  your Primary MD  Please request your Primary MD to go over all hospital tests and procedure/radiological results at the follow up, please ask your Primary MD to get all Hospital records sent to his/her office.  If you experience worsening of your admission symptoms, develop shortness of breath, life threatening emergency, suicidal or homicidal thoughts you must seek medical attention immediately by calling 911 or calling your MD immediately  if symptoms less severe.  You must read complete instructions/literature along with all the possible adverse reactions/side effects for all the Medicines you take and that have been prescribed to you. Take any new Medicines after you have completely understood and accpet all the possible adverse reactions/side effects.   Do not drive when taking Pain medications.   Do not take more than prescribed Pain, Sleep and Anxiety Medications  Special Instructions: If you have smoked or chewed Tobacco  in the last 2 yrs please stop smoking, stop any regular Alcohol  and or any Recreational drug use.  Wear Seat belts while driving.  Please note  You were cared for by a hospitalist during your hospital stay. Once you are discharged, your primary care physician will handle any further medical issues. Please note that NO REFILLS for any discharge medications will be authorized once you are discharged, as it is imperative that you return to your primary care physician (or establish a relationship with a primary care physician if you do not have one) for your aftercare needs so that they can reassess your need for medications and monitor your lab values.   Diet recommendation: Heart Healthy diet  Discharge Instructions    Call MD for:  difficulty breathing, headache or visual disturbances    Complete by:  As directed      Call MD for:  temperature >100.4    Complete by:  As directed      Diet - low sodium heart healthy    Complete by:  As directed      Increase  activity slowly    Complete by:  As directed            Follow-up Information    Follow up with KAPLAN,KRISTEN, PA-C. Schedule an appointment as soon as possible for a visit in 1 week.   Specialty:  Family Medicine   Why:  Hospital follow up   Contact information:   [address] [place] [address] [phone]       Follow up with Charolette Forward, MD. Schedule an appointment as soon as possible for a visit in 1 week.  Specialty:  Cardiology   Contact information:   [place] [address] [phone]      Total Time spent on discharge equals 25 minutes.  SignedOren Binet 09/25/2015 10:30 AM

## 2015-09-25 NOTE — Discharge Planning (Signed)
AVS reviewed with pt and family and given rx x 4. They verbalize understanding.  Will dc to private car home with all belongings, accompanied by family.

## 2015-09-27 LAB — CULTURE, BLOOD (ROUTINE X 2)
CULTURE: NO GROWTH
CULTURE: NO GROWTH

## 2015-11-02 ENCOUNTER — Ambulatory Visit (INDEPENDENT_AMBULATORY_CARE_PROVIDER_SITE_OTHER): Payer: Medicare HMO | Admitting: Pulmonary Disease

## 2015-11-02 ENCOUNTER — Encounter: Payer: Self-pay | Admitting: Pulmonary Disease

## 2015-11-02 VITALS — BP 142/70 | HR 60 | Ht 59.0 in | Wt 161.0 lb

## 2015-11-02 DIAGNOSIS — G4710 Hypersomnia, unspecified: Secondary | ICD-10-CM

## 2015-11-02 NOTE — Progress Notes (Signed)
Current Outpatient Prescriptions on File Prior to Visit  Medication Sig  . ACAI PO Take 1 capsule by mouth 2 (two) times daily.  Marland Kitchen amLODipine (NORVASC) 2.5 MG tablet Take 2.5 mg by mouth daily.  Marland Kitchen aspirin EC 81 MG tablet Take 81 mg by mouth daily.  . brimonidine (ALPHAGAN) 0.15 % ophthalmic solution Place 1 drop into the left eye 2 (two) times daily.   . Calcium Carbonate (OS-CAL PO) Take 1 tablet by mouth daily.  Marland Kitchen GARLIC PO Take 2 capsules by mouth daily.  Marland Kitchen guaiFENesin (MUCINEX) 600 MG 12 hr tablet Take 1 tablet (600 mg total) by mouth 2 (two) times daily.  Marland Kitchen ibuprofen (ADVIL,MOTRIN) 800 MG tablet Take 800 mg by mouth every 8 (eight) hours as needed. For pain  . losartan-hydrochlorothiazide (HYZAAR) 100-25 MG per tablet Take 1 tablet by mouth daily.  . Multiple Vitamin (MULTIVITAMIN WITH MINERALS) TABS Take 1 tablet by mouth daily.  Marland Kitchen omeprazole (PRILOSEC) 20 MG capsule Take 20 mg by mouth daily.  . polyethylene glycol (MIRALAX / GLYCOLAX) packet Take 17 g by mouth daily.  . prednisoLONE acetate (PRED FORTE) 1 % ophthalmic suspension Place 1 drop into the left eye daily.    No current facility-administered medications on file prior to visit.     Chief Complaint  Patient presents with  . Follow-up    Discuss sleep study results     Tests PSG 09/09/15 >> AHI 3.2, SpO2 low 87%. REM AHI 26.7.  Past medical hx Glaucoma, HTN, Lymphedema, CKD III  Past surgical hx, Allergies, Family hx, Social hx all reviewed.  Vital Signs BP 142/70 mmHg  Pulse 60  Ht 4\' 11"  (1.499 m)  Wt 161 lb (73.029 kg)  BMI 32.50 kg/m2  SpO2 99%  History of Present Illness [name] is a 82 y.o. female with sleep disruption.  She is here to review sleep study.  Overall normal, but did have REM related sleep apnea.  She feels her sleep is okay.  She sleeps 4 to 5 hours at night and then gets 2 to 3 hours during naps in the morning and evening.  She does not feel this interferes with her daytime  activities.  She feels good overall.  She had Echo and CT chest in February 2017 >> elevated PA pressures on Echo, no lung disease on CT chest.   Physical Exam  General - No distress ENT - No sinus tenderness, no oral exudate, no LAN Cardiac - s1s2 regular, no murmur Chest - No wheeze/rales/dullness Back - No focal tenderness Abd - Soft, non-tender Ext - No edema Neuro - Normal strength Skin - No rashes Psych - normal mood, and behavior   Assessment/Plan  Hypersomnia. - discussed proper sleep hygiene and trying to consolidate her sleep pattern  Pulmonary HTN on Echo from February 2017 >> likely from diastolic CHF. - f/u with cardiology   Patient Instructions  Follow up as needed     Chesley Mires, MD  Pulmonary/Critical Care/Sleep Pager:  [phone] 11/02/2015

## 2015-11-02 NOTE — Patient Instructions (Signed)
Follow up as needed

## 2015-12-20 ENCOUNTER — Emergency Department (HOSPITAL_COMMUNITY): Payer: Medicare HMO

## 2015-12-20 ENCOUNTER — Emergency Department (HOSPITAL_COMMUNITY)
Admission: EM | Admit: 2015-12-20 | Discharge: 2015-12-20 | Disposition: A | Discharge status: Home / Self Care | Payer: Medicare HMO | Attending: Emergency Medicine | Admitting: Emergency Medicine

## 2015-12-20 ENCOUNTER — Emergency Department (HOSPITAL_COMMUNITY)
Admission: EM | Admit: 2015-12-20 | Discharge: 2015-12-20 | Disposition: A | Discharge status: Home / Self Care | Payer: Medicare HMO | Source: Home / Self Care

## 2015-12-20 ENCOUNTER — Encounter (HOSPITAL_COMMUNITY): Payer: Self-pay | Admitting: Emergency Medicine

## 2015-12-20 ENCOUNTER — Encounter (HOSPITAL_COMMUNITY): Payer: Self-pay | Admitting: *Deleted

## 2015-12-20 DIAGNOSIS — G44319 Acute post-traumatic headache, not intractable: Secondary | ICD-10-CM

## 2015-12-20 DIAGNOSIS — Y9289 Other specified places as the place of occurrence of the external cause: Secondary | ICD-10-CM

## 2015-12-20 DIAGNOSIS — I129 Hypertensive chronic kidney disease with stage 1 through stage 4 chronic kidney disease, or unspecified chronic kidney disease: Secondary | ICD-10-CM | POA: Insufficient documentation

## 2015-12-20 DIAGNOSIS — S8992XA Unspecified injury of left lower leg, initial encounter: Secondary | ICD-10-CM

## 2015-12-20 DIAGNOSIS — R51 Headache: Secondary | ICD-10-CM | POA: Diagnosis not present

## 2015-12-20 DIAGNOSIS — M25532 Pain in left wrist: Secondary | ICD-10-CM

## 2015-12-20 DIAGNOSIS — M25539 Pain in unspecified wrist: Secondary | ICD-10-CM | POA: Insufficient documentation

## 2015-12-20 DIAGNOSIS — Y939 Activity, unspecified: Secondary | ICD-10-CM | POA: Insufficient documentation

## 2015-12-20 DIAGNOSIS — S6992XA Unspecified injury of left wrist, hand and finger(s), initial encounter: Secondary | ICD-10-CM

## 2015-12-20 DIAGNOSIS — W01198A Fall on same level from slipping, tripping and stumbling with subsequent striking against other object, initial encounter: Secondary | ICD-10-CM

## 2015-12-20 DIAGNOSIS — S0990XA Unspecified injury of head, initial encounter: Secondary | ICD-10-CM

## 2015-12-20 DIAGNOSIS — Y929 Unspecified place or not applicable: Secondary | ICD-10-CM | POA: Insufficient documentation

## 2015-12-20 DIAGNOSIS — M25512 Pain in left shoulder: Secondary | ICD-10-CM | POA: Diagnosis not present

## 2015-12-20 DIAGNOSIS — M25562 Pain in left knee: Secondary | ICD-10-CM | POA: Diagnosis present

## 2015-12-20 DIAGNOSIS — Z7982 Long term (current) use of aspirin: Secondary | ICD-10-CM | POA: Diagnosis not present

## 2015-12-20 DIAGNOSIS — W19XXXA Unspecified fall, initial encounter: Secondary | ICD-10-CM

## 2015-12-20 DIAGNOSIS — Y998 Other external cause status: Secondary | ICD-10-CM | POA: Insufficient documentation

## 2015-12-20 DIAGNOSIS — N183 Chronic kidney disease, stage 3 (moderate): Secondary | ICD-10-CM

## 2015-12-20 DIAGNOSIS — Z791 Long term (current) use of non-steroidal anti-inflammatories (NSAID): Secondary | ICD-10-CM | POA: Diagnosis not present

## 2015-12-20 DIAGNOSIS — Y9389 Activity, other specified: Secondary | ICD-10-CM

## 2015-12-20 DIAGNOSIS — Y999 Unspecified external cause status: Secondary | ICD-10-CM | POA: Insufficient documentation

## 2015-12-20 MED ORDER — ACETAMINOPHEN 325 MG PO TABS
650.0000 mg | ORAL_TABLET | Freq: Once | ORAL | Status: DC
  Filled 2015-12-20: qty 2

## 2015-12-20 NOTE — ED Notes (Signed)
Patient states that she was walking outside and tripped over some bricks. Patient fell hitting her head, left leg, left elbow. Patient denies any LOC.  Patient take ASA daily.

## 2015-12-20 NOTE — ED Notes (Signed)
Awaiting ride at this time.

## 2015-12-20 NOTE — ED Notes (Signed)
Patient transported to X-ray 

## 2015-12-20 NOTE — ED Notes (Signed)
Pt reports tripping and falling pta. Hit her head, denies loc. Reports mild pain to head, left wrist and knee.

## 2015-12-20 NOTE — ED Notes (Signed)
Pt reports understanding of discharge information. No questions at time of discharge 

## 2015-12-20 NOTE — ED Notes (Signed)
[facility] called  They want this pt discharged

## 2015-12-20 NOTE — ED Provider Notes (Signed)
CSN: [id number]     Arrival date & time 12/20/15  1606 History   First MD Initiated Contact with Patient 12/20/15 1837     Chief Complaint  Patient presents with  . Fall  . Headache  . Knee Pain  . Leg Pain  . Elbow Pain     (Consider location/radiation/quality/duration/timing/severity/associated sxs/prior Treatment) Patient is a 82 y.o. female presenting with fall.  Fall This is a new problem. The current episode started 1 to 2 hours ago. The problem occurs constantly. The problem has not changed since onset.Associated symptoms include headaches. Pertinent negatives include no chest pain, no abdominal pain and no shortness of breath. The symptoms are aggravated by bending and walking. Treatments tried: ibuprofen. The treatment provided mild relief.   Tripped over bricks lining grass outside, fell onto left side hitting left side of head, now with headache, neck pain, left shoulder, elbow, wrist, knee pain.  Pain 7/10.  Worse at base of thumb and knee. Did not fall directly onto knee.  No blood thinners.  No LOC. Otherwise in normal state of health.   Past Medical History  Diagnosis Date  . Glaucoma (increased eye pressure)   . Hypertension   . Neck pain   . Head pain   . Pain in joint, multiple sites   . Impaired glucose tolerance test   . Osteoarthrosis, unspecified whether generalized or localized, other specified sites   . Morbid obesity ([place])   . Encounter for long-term (current) use of other medications   . Other lymphedema   . Insomnia, unspecified   . Primary open-angle glaucoma(365.11)   . CAP (community acquired pneumonia) 09/21/2015  . Chronic kidney disease, stage III (moderate)    Past Surgical History  Procedure Laterality Date  . Eye surgery    . Abdominal hysterectomy     Family History  Problem Relation Age of Onset  . Cancer Father    Social History  Substance Use Topics  . Smoking status: Never Smoker   . Smokeless tobacco: Never Used  . Alcohol  Use: No   OB History    No data available     Review of Systems  Constitutional: Negative for fever.  HENT: Negative for sore throat.   Eyes: Negative for visual disturbance.  Respiratory: Negative for cough and shortness of breath.   Cardiovascular: Negative for chest pain.  Gastrointestinal: Negative for nausea, vomiting and abdominal pain.  Genitourinary: Negative for difficulty urinating.  Musculoskeletal: Positive for myalgias, arthralgias and neck pain. Negative for back pain.  Skin: Negative for rash.  Neurological: Positive for headaches. Negative for syncope.      Allergies  Penicillins and Tetanus toxoid  Home Medications   Prior to Admission medications   Medication Sig Start Date End Date Taking? Authorizing Provider  ACAI PO Take 1 capsule by mouth 2 (two) times daily.   Yes Historical Provider, MD  amLODipine (NORVASC) 2.5 MG tablet Take 2.5 mg by mouth daily.   Yes Historical Provider, MD  aspirin EC 81 MG tablet Take 81 mg by mouth daily.   Yes Historical Provider, MD  brimonidine (ALPHAGAN) 0.15 % ophthalmic solution Place 1 drop into the left eye 2 (two) times daily.  10/29/12  Yes Historical Provider, MD  Cholecalciferol (VITAMIN D-3 PO) Take 5,000 mg by mouth 2 (two) times daily.   Yes Historical Provider, MD  GARLIC PO Take 2 capsules by mouth daily.   Yes Historical Provider, MD  ibuprofen (ADVIL,MOTRIN) 200 MG tablet Take 200-400  mg by mouth every 6 (six) hours as needed for headache or moderate pain.   Yes Historical Provider, MD  losartan-hydrochlorothiazide (HYZAAR) 100-25 MG per tablet Take 1 tablet by mouth daily.   Yes Historical Provider, MD  Multiple Vitamin (MULTIVITAMIN WITH MINERALS) TABS Take 1 tablet by mouth daily.   Yes Historical Provider, MD  omeprazole (PRILOSEC) 20 MG capsule Take 20 mg by mouth daily.   Yes Historical Provider, MD  polyethylene glycol (MIRALAX / GLYCOLAX) packet Take 17 g by mouth daily. Patient taking differently: Take  17 g by mouth daily as needed for moderate constipation.  09/25/15  Yes Shanker Kristeen Mans, MD  prednisoLONE acetate (PRED FORTE) 1 % ophthalmic suspension Place 1 drop into the left eye daily.  10/29/12  Yes Historical Provider, MD  guaiFENesin (MUCINEX) 600 MG 12 hr tablet Take 1 tablet (600 mg total) by mouth 2 (two) times daily. Patient not taking: Reported on 12/20/2015 09/25/15   Jonetta Osgood, MD   BP 154/72 mmHg  Pulse 70  Temp(Src) 97.7 F (36.5 C) (Oral)  Resp 17  SpO2 100% Physical Exam  Constitutional: She is oriented to person, place, and time. She appears well-developed and well-nourished. No distress.  HENT:  Head: Normocephalic and atraumatic.  Eyes: Conjunctivae and EOM are normal.  Neck: Normal range of motion.  Cardiovascular: Normal rate, regular rhythm, normal heart sounds and intact distal pulses.  Exam reveals no gallop and no friction rub.   No murmur heard. Pulmonary/Chest: Effort normal and breath sounds normal. No respiratory distress. She has no wheezes. She has no rales.  Abdominal: Soft. She exhibits no distension. There is no tenderness. There is no guarding.  Musculoskeletal:       Left shoulder: She exhibits tenderness and bony tenderness.       Left elbow: She exhibits normal range of motion, no swelling, no effusion, no deformity and no laceration. Tenderness found. Radial head and olecranon process tenderness noted.       Left wrist: She exhibits tenderness and bony tenderness. She exhibits normal range of motion, no swelling and no laceration.       Right knee: She exhibits normal range of motion. No tenderness found.       Left knee: She exhibits normal range of motion, no laceration and no erythema. Tenderness found. Lateral joint line tenderness noted.       Cervical back: She exhibits tenderness and bony tenderness.       Thoracic back: She exhibits no bony tenderness.       Lumbar back: She exhibits no bony tenderness.       Right lower leg: She  exhibits tenderness, swelling and edema. She exhibits no deformity and no laceration.       Left lower leg: She exhibits tenderness, swelling and edema. She exhibits no deformity and no laceration.  Neurological: She is alert and oriented to person, place, and time.  Skin: Skin is warm and dry. No rash noted. She is not diaphoretic. There is erythema (bilateral lower extremities around calf circumferential).  Nursing note and vitals reviewed.   ED Course  Procedures (including critical care time) Labs Review Labs Reviewed - No data to display  Imaging Review Dg Elbow Complete Left  12/20/2015  CLINICAL DATA:  Fall and driveway today at home with left elbow pain. EXAM: LEFT ELBOW - COMPLETE 3+ VIEW COMPARISON:  None. FINDINGS: Diffuse decreased bone mineralization. Mild degenerative changes of the elbow. Minimal chondrocalcinosis present. No acute fracture  or dislocation. IMPRESSION: No acute findings. Mild degenerative changes. Electronically Signed   By: Marin Olp M.D.   On: 12/20/2015 17:20   Dg Wrist Complete Left  12/20/2015  CLINICAL DATA:  Tripped over bricks while walking outside, with left wrist pain. Initial encounter. EXAM: LEFT WRIST - COMPLETE 3+ VIEW COMPARISON:  Left wrist radiographs performed 10/21/2015 FINDINGS: There is no evidence of fracture or dislocation. The carpal rows are intact, and demonstrate normal alignment. Mild degenerative change is noted at the first carpometacarpal joint. Cortical irregularity about the distal radial styloid may reflect remote injury. There is calcification of the triangular fibrocartilage. No significant soft tissue abnormalities are otherwise apparent on radiograph. IMPRESSION: 1. No evidence of fracture or dislocation. 2. Mild degenerative change at the first carpometacarpal joint. 3. Calcification of the triangular fibrocartilage. Electronically Signed   By: Garald Balding M.D.   On: 12/20/2015 20:51   Dg Tibia/fibula Left  12/20/2015   CLINICAL DATA:  Fall at home today and driveway with left lower leg pain. EXAM: LEFT TIBIA AND FIBULA - 2 VIEW COMPARISON:  None. FINDINGS: There is diffuse decreased bone mineralization. There are mild degenerative changes of the knee joint. No evidence of acute fracture or dislocation. IMPRESSION: No acute findings. Electronically Signed   By: Marin Olp M.D.   On: 12/20/2015 17:23   Ct Head Wo Contrast  12/20/2015  CLINICAL DATA:  Fall. EXAM: CT HEAD WITHOUT CONTRAST CT CERVICAL SPINE WITHOUT CONTRAST TECHNIQUE: Multidetector CT imaging of the head and cervical spine was performed following the standard protocol without intravenous contrast. Multiplanar CT image reconstructions of the cervical spine were also generated. COMPARISON:  CT scan of September 22, 2015. FINDINGS: CT HEAD FINDINGS Bony calvarium appears intact. No mass effect or midline shift is noted. Ventricular size is within normal limits. There is no evidence of mass lesion, hemorrhage or acute infarction. Mild chronic ischemic white matter disease is noted. CT CERVICAL SPINE FINDINGS Reversal of normal lordosis is noted most likely positional in origin or secondary to degenerative change. No fracture or spondylolisthesis is noted. Moderate degenerative disc disease is noted at C3-4, C4-5, C5-6 and C6-7. Visualized lung fields appear normal. IMPRESSION: Mild chronic ischemic white matter disease. No acute intracranial abnormality seen. Multilevel degenerative disc disease. No acute abnormality seen in the cervical spine. Electronically Signed   By: Marijo Conception, M.D.   On: 12/20/2015 20:01   Ct Cervical Spine Wo Contrast  12/20/2015  CLINICAL DATA:  Fall. EXAM: CT HEAD WITHOUT CONTRAST CT CERVICAL SPINE WITHOUT CONTRAST TECHNIQUE: Multidetector CT imaging of the head and cervical spine was performed following the standard protocol without intravenous contrast. Multiplanar CT image reconstructions of the cervical spine were also generated.  COMPARISON:  CT scan of September 22, 2015. FINDINGS: CT HEAD FINDINGS Bony calvarium appears intact. No mass effect or midline shift is noted. Ventricular size is within normal limits. There is no evidence of mass lesion, hemorrhage or acute infarction. Mild chronic ischemic white matter disease is noted. CT CERVICAL SPINE FINDINGS Reversal of normal lordosis is noted most likely positional in origin or secondary to degenerative change. No fracture or spondylolisthesis is noted. Moderate degenerative disc disease is noted at C3-4, C4-5, C5-6 and C6-7. Visualized lung fields appear normal. IMPRESSION: Mild chronic ischemic white matter disease. No acute intracranial abnormality seen. Multilevel degenerative disc disease. No acute abnormality seen in the cervical spine. Electronically Signed   By: Marijo Conception, M.D.   On: 12/20/2015 20:01  Dg Shoulder Left  12/20/2015  CLINICAL DATA:  Status post fall, with left shoulder pain. Initial encounter. EXAM: LEFT SHOULDER - 2+ VIEW COMPARISON:  Chest radiograph performed 09/22/2015, and CT of the chest performed 09/23/2015 FINDINGS: There is no evidence of fracture or dislocation. The left humeral head is seated within the glenoid fossa. The acromioclavicular joint is unremarkable in appearance. There is mild calcification about the left glenohumeral capsule. The visualized portions of the left lung are clear. IMPRESSION: No evidence of fracture or dislocation. Electronically Signed   By: Garald Balding M.D.   On: 12/20/2015 20:52   Dg Knee Complete 4 Views Left  12/20/2015  CLINICAL DATA:  Fall on driveway today at home with left knee pain. EXAM: LEFT KNEE - COMPLETE 4+ VIEW COMPARISON:  None. FINDINGS: Examination demonstrates mild-to-moderate tricompartmental osteoarthritic change. Mild chondrocalcinosis. Mild diffuse decreased bone mineralization is present. No evidence of acute fracture or dislocation. No significant joint effusion. Atherosclerotic plaque over  the distal femoral artery. IMPRESSION: No acute findings. Mild tricompartmental osteoarthritic change. Electronically Signed   By: Marin Olp M.D.   On: 12/20/2015 17:22   I have personally reviewed and evaluated these images and lab results as part of my medical decision-making.   EKG Interpretation None      MDM   Final diagnoses:  Fall, initial encounter  Acute post-traumatic headache, not intractable  Left knee pain  Left wrist pain  Left shoulder pain    82yo female with history of hypertension, lymphedema, presents with concern for mechanical fall from standing.  Head and cervical spine CT without evidence of acute traumatic abnormalities.  XR of shoulder, elbow, wrist, knee and tibia without sign of fracture.  Patient does report some pain at the base of the thumb, and was given thumb spica wrist splint with instructions to follow up with PCP within one week for reevaluation of pain, and if still present would consider reimaging of scaphoid and hand follow up.  Patient likely with contusions of multiple sites from falls. Doubt occult tibial plateau fx in setting of multiple areas of pain, and pt ability to bear weight.  No other acute medical concerns.  Prior to discharge, reports having pain also in RLE, which was also related to fall, and discussed that given other areas of more significant pain without fx, have low suspicion for acute fracture contributing to pain and that this likely represents contusion or strain.  Given pain was associated with her fall, and that she has no asymmetric swelling or asymmetric erythema have low suspicion for DVT or cellulitis.  Pt does have changes consistent with venous stasis.  Recommend PCP follow up. Patient discharged in stable condition with understanding of reasons to return.   Gareth Morgan, MD 12/21/15 1301

## 2016-04-14 LAB — GLUCOSE, POCT (MANUAL RESULT ENTRY): POC Glucose: 104 mg/dl — AB (ref 70–99)

## 2016-05-31 ENCOUNTER — Emergency Department (HOSPITAL_COMMUNITY)
Admission: EM | Admit: 2016-05-31 | Discharge: 2016-05-31 | Disposition: A | Discharge status: Home / Self Care | Payer: Medicare HMO | Attending: Emergency Medicine | Admitting: Emergency Medicine

## 2016-05-31 ENCOUNTER — Encounter (HOSPITAL_COMMUNITY): Payer: Self-pay

## 2016-05-31 ENCOUNTER — Emergency Department (HOSPITAL_COMMUNITY): Payer: Medicare HMO

## 2016-05-31 DIAGNOSIS — I129 Hypertensive chronic kidney disease with stage 1 through stage 4 chronic kidney disease, or unspecified chronic kidney disease: Secondary | ICD-10-CM | POA: Insufficient documentation

## 2016-05-31 DIAGNOSIS — Z7982 Long term (current) use of aspirin: Secondary | ICD-10-CM | POA: Insufficient documentation

## 2016-05-31 DIAGNOSIS — N183 Chronic kidney disease, stage 3 (moderate): Secondary | ICD-10-CM | POA: Diagnosis not present

## 2016-05-31 DIAGNOSIS — Z79899 Other long term (current) drug therapy: Secondary | ICD-10-CM | POA: Insufficient documentation

## 2016-05-31 DIAGNOSIS — J40 Bronchitis, not specified as acute or chronic: Secondary | ICD-10-CM | POA: Insufficient documentation

## 2016-05-31 DIAGNOSIS — R05 Cough: Secondary | ICD-10-CM | POA: Diagnosis present

## 2016-05-31 DIAGNOSIS — J069 Acute upper respiratory infection, unspecified: Secondary | ICD-10-CM | POA: Diagnosis not present

## 2016-05-31 MED ORDER — FLUTICASONE PROPIONATE 50 MCG/ACT NA SUSP: 2.0000 | Freq: Every day | NASAL | 0 refills | Status: DC

## 2016-05-31 MED ORDER — AZITHROMYCIN 250 MG PO TABS: ORAL_TABLET | ORAL | 0 refills | Status: DC

## 2016-05-31 NOTE — ED Triage Notes (Addendum)
Patient here with 2 weeks of cough, congestion and runny nose. No pain. Alert and oriented. No distress

## 2016-05-31 NOTE — ED Provider Notes (Signed)
I have personally seen and examined the patient. I have reviewed the documentation on PMH/FH/Soc Hx. I have discussed the plan of care with the resident and patient.  I have reviewed and agree with the resident's documentation. Please see associated encounter note.   EKG Interpretation None         Fatima Blank, MD 05/31/16 1701

## 2016-05-31 NOTE — ED Provider Notes (Signed)
[facility] DEPT Provider Note   CSN: [id number] Arrival date & time: 05/31/16  1246     History   Chief Complaint Chief Complaint  Patient presents with  . Cough  . Nasal Congestion    HPI [name] is a 82 y.o. female.  Patient presents with two weeks of nasal congestion, rhinorrhea, productive cough. Initially improved with Mucinex but now has not been getting worse. Was brought here by her family today as she has a history of pneumonia and they were concerned about this. The patient denies fevers, chest pain, or trouble breathing. Does report generally poor appetite though has been tolerating PO. Daughter notes she has had constipation recently.   The history is provided by the patient and a relative. No language interpreter was used.  URI   This is a new problem. Episode onset: two weeks ago. The problem has not changed since onset.There has been no fever. Associated symptoms include congestion, rhinorrhea and cough. Pertinent negatives include no chest pain, no nausea, no vomiting, no dysuria, no sinus pain, no sore throat and no wheezing. Treatments tried: Mucinex. The treatment provided moderate relief.    Past Medical History:  Diagnosis Date  . CAP (community acquired pneumonia) 09/21/2015  . Chronic kidney disease, stage III (moderate)   . Encounter for long-term (current) use of other medications   . Glaucoma (increased eye pressure)   . Head pain   . Hypertension   . Impaired glucose tolerance test   . Insomnia, unspecified   . Morbid obesity ([place])   . Neck pain   . Osteoarthrosis, unspecified whether generalized or localized, other specified sites   . Other lymphedema   . Pain in joint, multiple sites   . Primary open-angle glaucoma(365.11)     Patient Active Problem List   Diagnosis Date Noted  . Generalized weakness 09/22/2015  . Acidosis 09/22/2015  . Acute encephalopathy 09/22/2015  . Hyperglycemia 09/22/2015  . Leukopenia 09/22/2015  . CAP  (community acquired pneumonia) 09/22/2015  . Bilateral leg edema 09/22/2015  . Constipation 09/22/2015  . Snoring 09/13/2015  . Glaucoma (increased eye pressure)   . Hypertension   . Neck pain   . Head pain   . Pain in joint, multiple sites   . Impaired glucose tolerance test   . Osteoarthrosis, unspecified whether generalized or localized, other specified sites   . Morbid obesity ([place])   . Encounter for long-term (current) use of other medications   . Other lymphedema   . Insomnia, unspecified   . Chronic kidney disease, stage III (moderate)   . Primary open-angle glaucoma(365.11)     Past Surgical History:  Procedure Laterality Date  . ABDOMINAL HYSTERECTOMY    . EYE SURGERY      OB History    No data available       Home Medications    Prior to Admission medications   Medication Sig Start Date End Date Taking? Authorizing Provider  ACAI PO Take 1 capsule by mouth 2 (two) times daily.   Yes Historical Provider, MD  amLODipine (NORVASC) 2.5 MG tablet Take 2.5 mg by mouth daily.   Yes Historical Provider, MD  aspirin EC 81 MG tablet Take 81 mg by mouth daily.   Yes Historical Provider, MD  brimonidine (ALPHAGAN) 0.15 % ophthalmic solution Place 1 drop into the left eye 2 (two) times daily.  10/29/12  Yes Historical Provider, MD  Cholecalciferol (VITAMIN D-3 PO) Take 5,000 mg by mouth 2 (two) times daily.  Yes Historical Provider, MD  GARLIC PO Take 2 capsules by mouth daily.   Yes Historical Provider, MD  guaiFENesin (MUCINEX) 600 MG 12 hr tablet Take 1 tablet (600 mg total) by mouth 2 (two) times daily. 09/25/15  Yes Shanker Kristeen Mans, MD  ibuprofen (ADVIL,MOTRIN) 200 MG tablet Take 200-400 mg by mouth every 6 (six) hours as needed for headache or moderate pain.   Yes Historical Provider, MD  losartan-hydrochlorothiazide (HYZAAR) 100-25 MG per tablet Take 1 tablet by mouth daily.   Yes Historical Provider, MD  Multiple Vitamin (MULTIVITAMIN WITH MINERALS) TABS Take 1 tablet  by mouth daily.   Yes Historical Provider, MD  omeprazole (PRILOSEC) 20 MG capsule Take 20 mg by mouth daily.   Yes Historical Provider, MD  polyethylene glycol (MIRALAX / GLYCOLAX) packet Take 17 g by mouth daily. Patient taking differently: Take 17 g by mouth daily as needed for moderate constipation.  09/25/15  Yes Shanker Kristeen Mans, MD  prednisoLONE acetate (PRED FORTE) 1 % ophthalmic suspension Place 1 drop into the left eye daily.  10/29/12  Yes Historical Provider, MD  azithromycin (ZITHROMAX Z-PAK) 250 MG tablet Take 2 tabs (500 mg) on day 1 and 1 tab (250 mg) daily on days 2-5 05/31/16   Harlin Heys, MD  fluticasone [facility]) 50 MCG/ACT nasal spray Place 2 sprays into both nostrils daily. 05/31/16   Harlin Heys, MD    Family History Family History  Problem Relation Age of Onset  . Cancer Father     Social History Social History  Substance Use Topics  . Smoking status: Never Smoker  . Smokeless tobacco: Never Used  . Alcohol use No     Allergies   Penicillins and Tetanus toxoid   Review of Systems Review of Systems  Constitutional: Negative for chills and fever.  HENT: Positive for congestion and rhinorrhea. Negative for sore throat.   Eyes: Negative for visual disturbance.  Respiratory: Positive for cough. Negative for wheezing.   Cardiovascular: Negative for chest pain.  Gastrointestinal: Negative for nausea and vomiting.  Genitourinary: Negative for dysuria.  Musculoskeletal: Negative.   Skin: Negative.   Allergic/Immunologic: Negative for immunocompromised state.  Neurological: Negative.   Hematological: Does not bruise/bleed easily.  Psychiatric/Behavioral: Negative.      Physical Exam Updated Vital Signs BP 150/66 (BP Location: Right Arm)   Pulse 80   Temp 98.3 F (36.8 C) (Oral)   Resp 16   Ht 4\' 10"  (1.473 m)   Wt 71.8 kg   SpO2 98%   BMI 33.06 kg/m   Physical Exam  Constitutional: She is oriented to person, place, and time. She appears  well-developed and well-nourished. No distress.  HENT:  Head: Normocephalic and atraumatic.  Nose: Mucosal edema and rhinorrhea present. Right sinus exhibits no maxillary sinus tenderness and no frontal sinus tenderness. Left sinus exhibits no maxillary sinus tenderness and no frontal sinus tenderness.  Mouth/Throat: Oropharynx is clear and moist.  Eyes: Conjunctivae and EOM are normal. Right eye exhibits no discharge. Left eye exhibits no discharge.  Neck: Normal range of motion. Neck supple.  Cardiovascular: Normal rate, regular rhythm, normal heart sounds and intact distal pulses.  Exam reveals no gallop and no friction rub.   No murmur heard. Pulmonary/Chest: Effort normal and breath sounds normal. No respiratory distress. She has no wheezes. She has no rales.  Abdominal: Soft. She exhibits no distension and no mass. There is no tenderness.  Neurological: She is alert and oriented to person, place, and time.  Skin: Skin is warm and dry. No rash noted. She is not diaphoretic. No pallor.  Psychiatric: She has a normal mood and affect. Her behavior is normal. Judgment and thought content normal.     ED Treatments / Results  Labs (all labs ordered are listed, but only abnormal results are displayed) Labs Reviewed - No data to display  EKG  EKG Interpretation None       Radiology Dg Chest 2 View  Result Date: 05/31/2016 CLINICAL DATA:  Cough and congestion for 2 weeks.  Hypertension. EXAM: CHEST  2 VIEW COMPARISON:  Chest radiograph September 22, 2015 and chest CT September 23, 2015 FINDINGS: There is no edema or consolidation. The heart size and pulmonary vascularity are within normal limits. There is atherosclerotic calcification in the aorta. No adenopathy evident. There is mild degenerative change in the thoracic spine. IMPRESSION: Aortic atherosclerosis.  No edema or consolidation. Electronically Signed   By: Lowella Grip III M.D.   On: 05/31/2016 15:23     Procedures Procedures (including critical care time)  Medications Ordered in ED Medications - No data to display   Initial Impression / Assessment and Plan / ED Course  I have reviewed the triage vital signs and the nursing notes.  Pertinent labs & imaging results that were available during my care of the patient were reviewed by me and considered in my medical decision making (see chart for details).  Clinical Course   Patient presents with two weeks of congestion, rhinorrhea, and productive cough. No fevers or difficulty breathing. She is well-appearing on presentation. Has no fever and vital signs are unremarkable. She is breathing comfortably on room air with clear lung sounds, no hypoxia. CXR without signs of pneumonia though does have mild perihilar thickening appreciated. No sinus tenderness. She has no signs of sepsis. Feel her symptoms are likely due to upper respiratory infection. Given her age and the length of time of her symptoms, feel it is appropriate to cover her for bacterial bronchitis with azithromycin. She was also prescribed Flonase to use for nasal congestion. She is in good condition for discharge home.  Final Clinical Impressions(s) / ED Diagnoses   Final diagnoses:  Viral upper respiratory tract infection  Bronchitis    New Prescriptions Discharge Medication List as of 05/31/2016  4:50 PM    START taking these medications   Details  azithromycin (ZITHROMAX Z-PAK) 250 MG tablet Take 2 tabs (500 mg) on day 1 and 1 tab (250 mg) daily on days 2-5, Print    fluticasone (FLONASE) 50 MCG/ACT nasal spray Place 2 sprays into both nostrils daily., Starting Wed 05/31/2016, Print         Harlin Heys, MD 05/31/16 2317    Fatima Blank, MD 06/01/16 [phone]

## 2016-06-19 ENCOUNTER — Emergency Department (HOSPITAL_COMMUNITY): Payer: Medicare HMO

## 2016-06-19 ENCOUNTER — Emergency Department (HOSPITAL_COMMUNITY)
Admission: EM | Admit: 2016-06-19 | Discharge: 2016-06-19 | Disposition: A | Discharge status: Home / Self Care | Payer: Medicare HMO | Attending: Emergency Medicine | Admitting: Emergency Medicine

## 2016-06-19 ENCOUNTER — Encounter (HOSPITAL_COMMUNITY): Payer: Self-pay

## 2016-06-19 DIAGNOSIS — Z7982 Long term (current) use of aspirin: Secondary | ICD-10-CM | POA: Diagnosis not present

## 2016-06-19 DIAGNOSIS — I129 Hypertensive chronic kidney disease with stage 1 through stage 4 chronic kidney disease, or unspecified chronic kidney disease: Secondary | ICD-10-CM | POA: Insufficient documentation

## 2016-06-19 DIAGNOSIS — Z79899 Other long term (current) drug therapy: Secondary | ICD-10-CM | POA: Diagnosis not present

## 2016-06-19 DIAGNOSIS — K047 Periapical abscess without sinus: Secondary | ICD-10-CM | POA: Insufficient documentation

## 2016-06-19 DIAGNOSIS — N183 Chronic kidney disease, stage 3 (moderate): Secondary | ICD-10-CM | POA: Diagnosis not present

## 2016-06-19 DIAGNOSIS — R220 Localized swelling, mass and lump, head: Secondary | ICD-10-CM | POA: Diagnosis present

## 2016-06-19 MED ORDER — CLINDAMYCIN HCL 150 MG PO CAPS: 150.0000 mg | ORAL_CAPSULE | Freq: Four times a day (QID) | ORAL | 0 refills | Status: DC

## 2016-06-19 MED ORDER — SODIUM CHLORIDE 0.9 % IV SOLN: INTRAVENOUS | Status: DC

## 2016-06-19 NOTE — ED Provider Notes (Signed)
[place] DEPT Provider Note   CSN: [id number] Arrival date & time: 06/19/16  1050     History   Chief Complaint Chief Complaint  Patient presents with  . Facial Swelling    HPI [name] is a 82 y.o. female.  82 year old female presents with several day history of right-sided facial swelling without teeth or gum pain. Denies any drainage from inside your mouth. Denies any visual disturbances. States that the swelling started at the right mandible nausea extended to below the right orbit. Denies any trauma. No history of same. No fever or chills. No treatment used for this prior to arrival nothing makes her symptoms better.      Past Medical History:  Diagnosis Date  . CAP (community acquired pneumonia) 09/21/2015  . Chronic kidney disease, stage III (moderate)   . Encounter for long-term (current) use of other medications   . Glaucoma (increased eye pressure)   . Head pain   . Hypertension   . Impaired glucose tolerance test   . Insomnia, unspecified   . Morbid obesity ([place])   . Neck pain   . Osteoarthrosis, unspecified whether generalized or localized, other specified sites   . Other lymphedema   . Pain in joint, multiple sites   . Primary open-angle glaucoma(365.11)     Patient Active Problem List   Diagnosis Date Noted  . Generalized weakness 09/22/2015  . Acidosis 09/22/2015  . Acute encephalopathy 09/22/2015  . Hyperglycemia 09/22/2015  . Leukopenia 09/22/2015  . CAP (community acquired pneumonia) 09/22/2015  . Bilateral leg edema 09/22/2015  . Constipation 09/22/2015  . Snoring 09/13/2015  . Glaucoma (increased eye pressure)   . Hypertension   . Neck pain   . Head pain   . Pain in joint, multiple sites   . Impaired glucose tolerance test   . Osteoarthrosis, unspecified whether generalized or localized, other specified sites   . Morbid obesity ([place])   . Encounter for long-term (current) use of other medications   . Other lymphedema   .  Insomnia, unspecified   . Chronic kidney disease, stage III (moderate)   . Primary open-angle glaucoma(365.11)     Past Surgical History:  Procedure Laterality Date  . ABDOMINAL HYSTERECTOMY    . EYE SURGERY      OB History    No data available       Home Medications    Prior to Admission medications   Medication Sig Start Date End Date Taking? Authorizing Provider  ACAI PO Take 1 capsule by mouth 2 (two) times daily.    Historical Provider, MD  amLODipine (NORVASC) 2.5 MG tablet Take 2.5 mg by mouth daily.    Historical Provider, MD  aspirin EC 81 MG tablet Take 81 mg by mouth daily.    Historical Provider, MD  azithromycin (ZITHROMAX Z-PAK) 250 MG tablet Take 2 tabs (500 mg) on day 1 and 1 tab (250 mg) daily on days 2-5 05/31/16   Harlin Heys, MD  brimonidine (ALPHAGAN) 0.15 % ophthalmic solution Place 1 drop into the left eye 2 (two) times daily.  10/29/12   Historical Provider, MD  Cholecalciferol (VITAMIN D-3 PO) Take 5,000 mg by mouth 2 (two) times daily.    Historical Provider, MD  fluticasone (FLONASE) 50 MCG/ACT nasal spray Place 2 sprays into both nostrils daily. 05/31/16   Harlin Heys, MD  GARLIC PO Take 2 capsules by mouth daily.    Historical Provider, MD  guaiFENesin (MUCINEX) 600 MG 12 hr tablet Take  1 tablet (600 mg total) by mouth 2 (two) times daily. 09/25/15   Shanker Kristeen Mans, MD  ibuprofen (ADVIL,MOTRIN) 200 MG tablet Take 200-400 mg by mouth every 6 (six) hours as needed for headache or moderate pain.    Historical Provider, MD  losartan-hydrochlorothiazide (HYZAAR) 100-12.5 MG tablet Take 1 tablet by mouth daily. 04/09/16   Historical Provider, MD  Multiple Vitamin (MULTIVITAMIN WITH MINERALS) TABS Take 1 tablet by mouth daily.    Historical Provider, MD  omeprazole (PRILOSEC) 20 MG capsule Take 20 mg by mouth daily.    Historical Provider, MD  polyethylene glycol (MIRALAX / GLYCOLAX) packet Take 17 g by mouth daily. Patient taking differently: Take 17 g by  mouth daily as needed for moderate constipation.  09/25/15   Shanker Kristeen Mans, MD  prednisoLONE acetate (PRED FORTE) 1 % ophthalmic suspension Place 1 drop into the left eye daily.  10/29/12   Historical Provider, MD    Family History Family History  Problem Relation Age of Onset  . Cancer Father     Social History Social History  Substance Use Topics  . Smoking status: Never Smoker  . Smokeless tobacco: Never Used  . Alcohol use No     Allergies   Penicillins and Tetanus toxoid   Review of Systems Review of Systems  All other systems reviewed and are negative.    Physical Exam Updated Vital Signs BP 141/57 (BP Location: Right Arm)   Pulse 104   Temp 97.7 F (36.5 C) (Oral)   Resp 18   SpO2 100%   Physical Exam  Constitutional: She is oriented to person, place, and time. She appears well-developed and well-nourished.  Non-toxic appearance. No distress.  HENT:  Head: Normocephalic and atraumatic.    Swelling noted at the right mandible which extends to the right inferior orbit without crepitus or erythema. No intraoral abscess noted. Very poor dentition noted  Eyes: Conjunctivae, EOM and lids are normal. Pupils are equal, round, and reactive to light.  Neck: Normal range of motion. Neck supple. No tracheal deviation present. No thyroid mass present.  Cardiovascular: Normal rate, regular rhythm and normal heart sounds.  Exam reveals no gallop.   No murmur heard. Pulmonary/Chest: Effort normal and breath sounds normal. No stridor. No respiratory distress. She has no decreased breath sounds. She has no wheezes. She has no rhonchi. She has no rales.  Abdominal: Soft. Normal appearance and bowel sounds are normal. She exhibits no distension. There is no tenderness. There is no rebound and no CVA tenderness.  Musculoskeletal: Normal range of motion. She exhibits no edema or tenderness.  Neurological: She is alert and oriented to person, place, and time. She has normal  strength. No cranial nerve deficit or sensory deficit. GCS eye subscore is 4. GCS verbal subscore is 5. GCS motor subscore is 6.  Skin: Skin is warm and dry. No abrasion and no rash noted.  Psychiatric: She has a normal mood and affect. Her speech is normal and behavior is normal.  Nursing note and vitals reviewed.    ED Treatments / Results  Labs (all labs ordered are listed, but only abnormal results are displayed) Labs Reviewed  CBC WITH DIFFERENTIAL/PLATELET  BASIC METABOLIC PANEL    EKG  EKG Interpretation None       Radiology No results found.  Procedures Procedures (including critical care time)  Medications Ordered in ED Medications  0.9 %  sodium chloride infusion (not administered)     Initial Impression / Assessment  and Plan / ED Course  I have reviewed the triage vital signs and the nursing notes.  Pertinent labs & imaging results that were available during my care of the patient were reviewed by me and considered in my medical decision making (see chart for details).  Clinical Course     Will place patient on clindamycin and given return precautions  Final Clinical Impressions(s) / ED Diagnoses   Final diagnoses:  None    New Prescriptions New Prescriptions   No medications on file     Lacretia Leigh, MD 06/19/16 1444

## 2016-06-19 NOTE — ED Notes (Signed)
Unable to obtain blood from patient.  Informed doctor [name] and he said we could wait till results of her CT came in.

## 2016-06-19 NOTE — ED Notes (Signed)
Patient is alert and oriented x4.  She is complaining of right side facial swelling of unknown origin.  Patient denies any contact with penicillin or tetanus.  Denies any swallow or visual issues.

## 2016-06-19 NOTE — ED Notes (Signed)
Below order not completed by EW. 

## 2016-06-19 NOTE — ED Triage Notes (Addendum)
Pt with facial swelling on rt side since Friday.  Pt denies dental pain.  Pt recently treated for bronchitis and took z-pack.  TX was completed.  Pt still with sinus symptoms.  No fever.  No shortness of breath or difficulty swallowing.

## 2016-06-27 ENCOUNTER — Ambulatory Visit (HOSPITAL_COMMUNITY)
Admission: EM | Admit: 2016-06-27 | Discharge: 2016-06-27 | Disposition: A | Discharge status: Home / Self Care | Payer: Medicare HMO | Attending: Emergency Medicine | Admitting: Emergency Medicine

## 2016-06-27 ENCOUNTER — Encounter (HOSPITAL_COMMUNITY): Payer: Self-pay | Admitting: Emergency Medicine

## 2016-06-27 DIAGNOSIS — L309 Dermatitis, unspecified: Secondary | ICD-10-CM | POA: Diagnosis not present

## 2016-06-27 MED ORDER — HYDROCORTISONE 2.5 % EX LOTN: TOPICAL_LOTION | Freq: Two times a day (BID) | CUTANEOUS | 0 refills | Status: DC

## 2016-06-27 MED ORDER — TRIAMCINOLONE ACETONIDE 0.5 % EX CREA: 1.0000 "application " | TOPICAL_CREAM | Freq: Two times a day (BID) | CUTANEOUS | 0 refills | Status: DC

## 2016-06-27 NOTE — ED Triage Notes (Signed)
The patient presented to the [facility] with a complaint of a rash on face, neck and chest that has been there for 8 days. The patient reported that she went to [facility] ED for the rash and facial swelling and was diagnosed with a dental abscess and prescribed an antibiotic that she will finish today. She reported that the rash was not addressed and that is why she is here today.

## 2016-06-27 NOTE — ED Provider Notes (Signed)
[place]    CSN: [id number] Arrival date & time: 06/27/16  1012     History   Chief Complaint Chief Complaint  Patient presents with  . Rash    HPI [name] is a 82 y.o. female.   HPI  She is an 82 year old woman here for evaluation of rash. She states for the last week or so she has had dry, scaly, an itchy rash on her chest, neck, and face. She has been putting shea butter on it with minimal improvement.  Past Medical History:  Diagnosis Date  . CAP (community acquired pneumonia) 09/21/2015  . Chronic kidney disease, stage III (moderate)   . Encounter for long-term (current) use of other medications   . Glaucoma (increased eye pressure)   . Head pain   . Hypertension   . Impaired glucose tolerance test   . Insomnia, unspecified   . Morbid obesity ([place])   . Neck pain   . Osteoarthrosis, unspecified whether generalized or localized, other specified sites   . Other lymphedema   . Pain in joint, multiple sites   . Primary open-angle glaucoma(365.11)     Patient Active Problem List   Diagnosis Date Noted  . Generalized weakness 09/22/2015  . Acidosis 09/22/2015  . Acute encephalopathy 09/22/2015  . Hyperglycemia 09/22/2015  . Leukopenia 09/22/2015  . CAP (community acquired pneumonia) 09/22/2015  . Bilateral leg edema 09/22/2015  . Constipation 09/22/2015  . Snoring 09/13/2015  . Glaucoma (increased eye pressure)   . Hypertension   . Neck pain   . Head pain   . Pain in joint, multiple sites   . Impaired glucose tolerance test   . Osteoarthrosis, unspecified whether generalized or localized, other specified sites   . Morbid obesity ([place])   . Encounter for long-term (current) use of other medications   . Other lymphedema   . Insomnia, unspecified   . Chronic kidney disease, stage III (moderate)   . Primary open-angle glaucoma(365.11)     Past Surgical History:  Procedure Laterality Date  . ABDOMINAL HYSTERECTOMY    . EYE SURGERY       OB History    No data available       Home Medications    Prior to Admission medications   Medication Sig Start Date End Date Taking? Authorizing Provider  amLODipine (NORVASC) 2.5 MG tablet Take 2.5 mg by mouth daily.   Yes Historical Provider, MD  aspirin EC 81 MG tablet Take 81 mg by mouth daily.   Yes Historical Provider, MD  azithromycin (ZITHROMAX Z-PAK) 250 MG tablet Take 2 tabs (500 mg) on day 1 and 1 tab (250 mg) daily on days 2-5 05/31/16  Yes Harlin Heys, MD  brimonidine (ALPHAGAN) 0.15 % ophthalmic solution Place 1 drop into the left eye 2 (two) times daily.  10/29/12  Yes Historical Provider, MD  clindamycin (CLEOCIN) 150 MG capsule Take 1 capsule (150 mg total) by mouth every 6 (six) hours. 06/19/16  Yes Lacretia Leigh, MD  Cyanocobalamin (VITAMIN B-12 PO) Take 1 tablet by mouth daily.   Yes Historical Provider, MD  fluticasone (FLONASE) 50 MCG/ACT nasal spray Place 2 sprays into both nostrils daily. 05/31/16  Yes Harlin Heys, MD  GARLIC PO Take 2,979 mg by mouth daily.    Yes Historical Provider, MD  guaiFENesin (MUCINEX) 600 MG 12 hr tablet Take 1 tablet (600 mg total) by mouth 2 (two) times daily. Patient taking differently: Take 600 mg by mouth 2 (two) times  daily as needed for cough or to loosen phlegm.  09/25/15  Yes Shanker Kristeen Mans, MD  ibuprofen (ADVIL,MOTRIN) 800 MG tablet Take 800 mg by mouth every 8 (eight) hours as needed for fever, headache, mild pain, moderate pain or cramping.   Yes Historical Provider, MD  losartan-hydrochlorothiazide (HYZAAR) 100-12.5 MG tablet Take 1 tablet by mouth daily. 04/09/16  Yes Historical Provider, MD  Multiple Vitamin (MULTIVITAMIN WITH MINERALS) TABS Take 1 tablet by mouth daily.   Yes Historical Provider, MD  omeprazole (PRILOSEC) 20 MG capsule Take 20 mg by mouth daily.   Yes Historical Provider, MD  polyethylene glycol (MIRALAX / GLYCOLAX) packet Take 17 g by mouth daily. Patient taking differently: Take 17 g by mouth daily  as needed for moderate constipation.  09/25/15  Yes Shanker Kristeen Mans, MD  prednisoLONE acetate (PRED FORTE) 1 % ophthalmic suspension Place 1 drop into the left eye daily.  10/29/12  Yes Historical Provider, MD  ACAI PO Take 1 capsule by mouth 2 (two) times daily.    Historical Provider, MD  hydrocortisone 2.5 % lotion Apply topically 2 (two) times daily. To face for 1 week. 06/27/16   Melony Overly, MD  triamcinolone cream (KENALOG) 0.5 % Apply 1 application topically 2 (two) times daily. To chest and neck for 1 week. 06/27/16   Melony Overly, MD    Family History Family History  Problem Relation Age of Onset  . Cancer Father     Social History Social History  Substance Use Topics  . Smoking status: Never Smoker  . Smokeless tobacco: Never Used  . Alcohol use No     Allergies   Penicillins and Tetanus toxoid   Review of Systems Review of Systems As in history of present illness   Physical Exam Triage Vital Signs ED Triage Vitals  Enc Vitals Group     BP 06/27/16 1114 154/72     Pulse Rate 06/27/16 1114 69     Resp 06/27/16 1114 16     Temp 06/27/16 1114 98.1 F (36.7 C)     Temp Source 06/27/16 1114 Oral     SpO2 06/27/16 1114 96 %     Weight --      Height --      Head Circumference --      Peak Flow --      Pain Score 06/27/16 1120 0     Pain Loc --      Pain Edu? --      Excl. in [place]? --    No data found.   Updated Vital Signs BP 154/72 (BP Location: Left Wrist)   Pulse 69   Temp 98.1 F (36.7 C) (Oral)   Resp 16   SpO2 96%   Visual Acuity Right Eye Distance:   Left Eye Distance:   Bilateral Distance:    Right Eye Near:   Left Eye Near:    Bilateral Near:     Physical Exam  Constitutional: She is oriented to person, place, and time. She appears well-developed and well-nourished. No distress.  Cardiovascular: Normal rate.   Pulmonary/Chest: Effort normal.  Neurological: She is alert and oriented to person, place, and time.  Skin: Rash  (eczematous rash on chest and neck.  Dry skin on face.) noted.     UC Treatments / Results  Labs (all labs ordered are listed, but only abnormal results are displayed) Labs Reviewed - No data to display  EKG  EKG Interpretation None  Radiology No results found.  Procedures Procedures (including critical care time)  Medications Ordered in UC Medications - No data to display   Initial Impression / Assessment and Plan / UC Course  I have reviewed the triage vital signs and the nursing notes.  Pertinent labs & imaging results that were available during my care of the patient were reviewed by me and considered in my medical decision making (see chart for details).  Clinical Course     Triamcinolone cream for the chest. Hydrocortisone for the face. Follow-up if not improving in 1 week.  Final Clinical Impressions(s) / UC Diagnoses   Final diagnoses:  Eczema, unspecified type    New Prescriptions New Prescriptions   HYDROCORTISONE 2.5 % LOTION    Apply topically 2 (two) times daily. To face for 1 week.   TRIAMCINOLONE CREAM (KENALOG) 0.5 %    Apply 1 application topically 2 (two) times daily. To chest and neck for 1 week.     Melony Overly, MD 06/27/16 1159

## 2016-06-27 NOTE — Discharge Instructions (Signed)
You have some eczema. Use the triamcinolone on the chest and neck twice a day for 1 week. Use the hydrocortisone on your face twice a day for 1 week. If things are not improving in 1 week, please come back for recheck.

## 2016-10-28 ENCOUNTER — Emergency Department (HOSPITAL_COMMUNITY)
Admission: EM | Admit: 2016-10-28 | Discharge: 2016-10-28 | Disposition: A | Discharge status: Home / Self Care | Payer: Medicare HMO | Attending: Emergency Medicine | Admitting: Emergency Medicine

## 2016-10-28 ENCOUNTER — Emergency Department (HOSPITAL_COMMUNITY): Payer: Medicare HMO

## 2016-10-28 ENCOUNTER — Encounter (HOSPITAL_COMMUNITY): Payer: Self-pay

## 2016-10-28 DIAGNOSIS — S060X0A Concussion without loss of consciousness, initial encounter: Secondary | ICD-10-CM | POA: Insufficient documentation

## 2016-10-28 DIAGNOSIS — Z79899 Other long term (current) drug therapy: Secondary | ICD-10-CM | POA: Insufficient documentation

## 2016-10-28 DIAGNOSIS — Y929 Unspecified place or not applicable: Secondary | ICD-10-CM | POA: Insufficient documentation

## 2016-10-28 DIAGNOSIS — Y999 Unspecified external cause status: Secondary | ICD-10-CM | POA: Insufficient documentation

## 2016-10-28 DIAGNOSIS — N183 Chronic kidney disease, stage 3 (moderate): Secondary | ICD-10-CM | POA: Diagnosis not present

## 2016-10-28 DIAGNOSIS — Z7982 Long term (current) use of aspirin: Secondary | ICD-10-CM | POA: Diagnosis not present

## 2016-10-28 DIAGNOSIS — I129 Hypertensive chronic kidney disease with stage 1 through stage 4 chronic kidney disease, or unspecified chronic kidney disease: Secondary | ICD-10-CM | POA: Insufficient documentation

## 2016-10-28 DIAGNOSIS — S0990XA Unspecified injury of head, initial encounter: Secondary | ICD-10-CM | POA: Diagnosis present

## 2016-10-28 DIAGNOSIS — Y939 Activity, unspecified: Secondary | ICD-10-CM | POA: Insufficient documentation
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
## 2016-11-06 ENCOUNTER — Emergency Department (HOSPITAL_COMMUNITY): Payer: Medicare HMO

## 2016-11-06 ENCOUNTER — Ambulatory Visit (INDEPENDENT_AMBULATORY_CARE_PROVIDER_SITE_OTHER): Payer: Medicare HMO | Admitting: Physician Assistant

## 2016-11-06 ENCOUNTER — Emergency Department (HOSPITAL_COMMUNITY)
Admission: EM | Admit: 2016-11-06 | Discharge: 2016-11-06 | Disposition: A | Discharge status: Home / Self Care | Payer: Medicare HMO | Attending: Emergency Medicine | Admitting: Emergency Medicine

## 2016-11-06 ENCOUNTER — Encounter (INDEPENDENT_AMBULATORY_CARE_PROVIDER_SITE_OTHER): Payer: Self-pay | Admitting: Physician Assistant

## 2016-11-06 ENCOUNTER — Encounter (HOSPITAL_COMMUNITY): Payer: Self-pay

## 2016-11-06 VITALS — BP 176/81 | HR 79 | Temp 97.4°F | Ht <= 58 in | Wt 166.6 lb

## 2016-11-06 DIAGNOSIS — N183 Chronic kidney disease, stage 3 (moderate): Secondary | ICD-10-CM | POA: Insufficient documentation

## 2016-11-06 DIAGNOSIS — K219 Gastro-esophageal reflux disease without esophagitis: Secondary | ICD-10-CM | POA: Diagnosis not present

## 2016-11-06 DIAGNOSIS — Z79899 Other long term (current) drug therapy: Secondary | ICD-10-CM | POA: Diagnosis not present

## 2016-11-06 DIAGNOSIS — M542 Cervicalgia: Secondary | ICD-10-CM

## 2016-11-06 DIAGNOSIS — R9431 Abnormal electrocardiogram [ECG] [EKG]: Secondary | ICD-10-CM

## 2016-11-06 DIAGNOSIS — I10 Essential (primary) hypertension: Secondary | ICD-10-CM

## 2016-11-06 DIAGNOSIS — I499 Cardiac arrhythmia, unspecified: Secondary | ICD-10-CM | POA: Diagnosis not present

## 2016-11-06 DIAGNOSIS — Z7982 Long term (current) use of aspirin: Secondary | ICD-10-CM | POA: Diagnosis not present

## 2016-11-06 DIAGNOSIS — I129 Hypertensive chronic kidney disease with stage 1 through stage 4 chronic kidney disease, or unspecified chronic kidney disease: Secondary | ICD-10-CM | POA: Insufficient documentation

## 2016-11-06 DIAGNOSIS — R009 Unspecified abnormalities of heart beat: Secondary | ICD-10-CM | POA: Insufficient documentation

## 2016-11-06 DIAGNOSIS — R0689 Other abnormalities of breathing: Secondary | ICD-10-CM | POA: Diagnosis not present

## 2016-11-06 LAB — URINALYSIS, ROUTINE W REFLEX MICROSCOPIC
Bilirubin Urine: NEGATIVE
Glucose, UA: NEGATIVE mg/dL
Ketones, ur: NEGATIVE mg/dL
Leukocytes, UA: NEGATIVE
Nitrite: NEGATIVE
PROTEIN: NEGATIVE mg/dL
SPECIFIC GRAVITY, URINE: 1.006 (ref 1.005–1.030)
pH: 7 (ref 5.0–8.0)

## 2016-11-06 LAB — COMPREHENSIVE METABOLIC PANEL
ALK PHOS: 110 U/L (ref 38–126)
ALT: 16 U/L (ref 14–54)
AST: 23 U/L (ref 15–41)
Albumin: 3.6 g/dL (ref 3.5–5.0)
Anion gap: 4 — ABNORMAL LOW (ref 5–15)
BUN: 24 mg/dL — AB (ref 6–20)
CHLORIDE: 108 mmol/L (ref 101–111)
CO2: 29 mmol/L (ref 22–32)
CREATININE: 0.86 mg/dL (ref 0.44–1.00)
Calcium: 9.1 mg/dL (ref 8.9–10.3)
GFR calc Af Amer: >60 mL/min (ref >60)
GFR calc non Af Amer: >60 mL/min (ref >60)
GLUCOSE: 85 mg/dL (ref 65–99)
POTASSIUM: 4 mmol/L (ref 3.5–5.1)
SODIUM: 141 mmol/L (ref 135–145)
Total Bilirubin: 0.7 mg/dL (ref 0.3–1.2)
Total Protein: 7.2 g/dL (ref 6.5–8.1)

## 2016-11-06 LAB — CBC
HCT: 34.3 % — ABNORMAL LOW (ref 36.0–46.0)
Hemoglobin: 11 g/dL — ABNORMAL LOW (ref 12.0–15.0)
MCH: 25.5 pg — AB (ref 26.0–34.0)
MCHC: 32.1 g/dL (ref 30.0–36.0)
MCV: 79.4 fL (ref 78.0–100.0)
PLATELETS: 184 10*3/uL (ref 150–400)
RBC: 4.32 MIL/uL (ref 3.87–5.11)
RDW: 16.8 % — AB (ref 11.5–15.5)
WBC: 3.8 10*3/uL — ABNORMAL LOW (ref 4.0–10.5)

## 2016-11-06 LAB — LIPASE, BLOOD: Lipase: 27 U/L (ref 11–51)

## 2016-11-06 LAB — I-STAT TROPONIN, ED: TROPONIN I, POC: 0.01 ng/mL (ref 0.00–0.08)

## 2016-11-06 LAB — BRAIN NATRIURETIC PEPTIDE: B Natriuretic Peptide: 238.6 pg/mL — ABNORMAL HIGH (ref 0.0–100.0)

## 2016-11-06 MED ORDER — OMEPRAZOLE 20 MG PO CPDR: 20.0000 mg | DELAYED_RELEASE_CAPSULE | Freq: Every day | ORAL | 3 refills | Status: DC

## 2016-11-06 MED ORDER — AMLODIPINE BESYLATE 2.5 MG PO TABS: 2.5000 mg | ORAL_TABLET | Freq: Every day | ORAL | 1 refills | Status: DC

## 2016-11-06 MED ORDER — LOSARTAN POTASSIUM-HCTZ 100-12.5 MG PO TABS: 1.0000 | ORAL_TABLET | Freq: Every day | ORAL | 1 refills | Status: DC

## 2016-11-06 NOTE — ED Triage Notes (Signed)
PT SENT FROM HER PCP FOR AN ABNORMAL EKG. PT DENIES CHEST PAIN OR SOB.

## 2016-11-06 NOTE — Patient Instructions (Signed)
I advise that you go directly to the  Emergency department. You have an irregular heartbeat and abnormal EKG. You should be further evaluated in an emergent setting with possible observation at the hospital.

## 2016-11-06 NOTE — ED Provider Notes (Signed)
[facility] DEPT Provider Note   CSN: [id number] Arrival date & time: 11/06/16  1814     History   Chief Complaint Chief Complaint  Patient presents with  . Irregular Heart Beat    HPI [name] is a 83 y.o. female.  HPI Patient referred from her primary 89 office for abnormal EKG. Was irregular and thought that she possibly was in atrial fibrillation. Patient states she's having no chest pain. Denies any palpitations. She has some mild exertional dyspnea which is unchanged for her. Denies any fever or chills. No cough. She has bilateral lower extremity swelling which again is unchanged. Past Medical History:  Diagnosis Date  . CAP (community acquired pneumonia) 09/21/2015  . Chronic kidney disease, stage III (moderate)   . Encounter for long-term (current) use of other medications   . Glaucoma (increased eye pressure)   . Head pain   . Hypertension   . Impaired glucose tolerance test   . Insomnia, unspecified   . Morbid obesity ([place])   . Neck pain   . Osteoarthrosis, unspecified whether generalized or localized, other specified sites   . Other lymphedema   . Pain in joint, multiple sites   . Primary open-angle glaucoma(365.11)     Patient Active Problem List   Diagnosis Date Noted  . Generalized weakness 09/22/2015  . Acidosis 09/22/2015  . Acute encephalopathy 09/22/2015  . Hyperglycemia 09/22/2015  . Leukopenia 09/22/2015  . CAP (community acquired pneumonia) 09/22/2015  . Bilateral leg edema 09/22/2015  . Constipation 09/22/2015  . Snoring 09/13/2015  . Glaucoma (increased eye pressure)   . Hypertension   . Neck pain   . Head pain   . Pain in joint, multiple sites   . Impaired glucose tolerance test   . Osteoarthrosis, unspecified whether generalized or localized, other specified sites   . Morbid obesity ([place])   . Encounter for long-term (current) use of other medications   . Other lymphedema   . Insomnia, unspecified   . Chronic kidney  disease, stage III (moderate)   . Primary open-angle glaucoma(365.11)     Past Surgical History:  Procedure Laterality Date  . ABDOMINAL HYSTERECTOMY    . EYE SURGERY      OB History    No data available       Home Medications    Prior to Admission medications   Medication Sig Start Date End Date Taking? Authorizing Provider  ACAI PO Take 1 capsule by mouth 2 (two) times daily.   Yes Historical Provider, MD  amLODipine (NORVASC) 2.5 MG tablet Take 1 tablet (2.5 mg total) by mouth daily. 11/06/16  Yes Roger Roderic Ovens, PA-C  aspirin EC 81 MG tablet Take 81 mg by mouth daily.   Yes Historical Provider, MD  brimonidine (ALPHAGAN) 0.15 % ophthalmic solution Place 1 drop into the left eye 2 (two) times daily.  10/29/12  Yes Historical Provider, MD  Cyanocobalamin (VITAMIN B-12 PO) Take 1 tablet by mouth daily.   Yes Historical Provider, MD  GARLIC PO Take 8,563 mg by mouth daily.    Yes Historical Provider, MD  ibuprofen (ADVIL,MOTRIN) 800 MG tablet Take 800 mg by mouth every 8 (eight) hours as needed for fever, headache, mild pain, moderate pain or cramping.   Yes Historical Provider, MD  KLOR-CON M20 20 MEQ tablet Take 20 mEq by mouth daily. 09/19/16  Yes Historical Provider, MD  losartan-hydrochlorothiazide (HYZAAR) 100-25 MG tablet Take 1 tablet by mouth daily. 10/02/16  Yes Historical Provider, MD  Multiple Vitamin (MULTIVITAMIN WITH MINERALS) TABS Take 1 tablet by mouth daily.   Yes Historical Provider, MD  omeprazole (PRILOSEC) 20 MG capsule Take 1 capsule (20 mg total) by mouth daily. 11/06/16  Yes Roger Roderic Ovens, PA-C  Polyethyl Glycol-Propyl Glycol 0.4-0.3 % SOLN Place 1 drop into both eyes 3 (three) times daily as needed (for lubricating eyes).   Yes Historical Provider, MD  prednisoLONE acetate (PRED FORTE) 1 % ophthalmic suspension Place 1 drop into the left eye daily.  10/29/12  Yes Historical Provider, MD  losartan-hydrochlorothiazide (HYZAAR) 100-12.5 MG tablet Take 1 tablet by  mouth daily. Patient not taking: Reported on 11/06/2016 11/06/16   Clent Demark, PA-C    Family History Family History  Problem Relation Age of Onset  . Cancer Father     Social History Social History  Substance Use Topics  . Smoking status: Never Smoker  . Smokeless tobacco: Never Used  . Alcohol use No     Allergies   Penicillins and Tetanus toxoid   Review of Systems Review of Systems  Constitutional: Negative for chills and fever.  Respiratory: Positive for shortness of breath. Negative for cough.   Cardiovascular: Positive for leg swelling. Negative for chest pain and palpitations.  Gastrointestinal: Negative for abdominal pain, constipation, diarrhea, nausea and vomiting.  Genitourinary: Negative for dysuria, flank pain and frequency.  Musculoskeletal: Negative for back pain, myalgias, neck pain and neck stiffness.  Skin: Negative for rash and wound.  Neurological: Negative for dizziness, syncope, weakness, light-headedness, numbness and headaches.  All other systems reviewed and are negative.    Physical Exam Updated Vital Signs BP (!) 157/78 (BP Location: Left Arm)   Pulse 89   Temp 97.7 F (36.5 C) (Oral)   Resp 17   Ht 4\' 9"  (1.448 m)   Wt 166 lb (75.3 kg)   SpO2 98%   BMI 35.92 kg/m   Physical Exam  Constitutional: She is oriented to person, place, and time. She appears well-developed and well-nourished. No distress.  HENT:  Head: Normocephalic and atraumatic.  Mouth/Throat: Oropharynx is clear and moist. No oropharyngeal exudate.  Eyes: EOM are normal. Pupils are equal, round, and reactive to light.  Neck: Normal range of motion. Neck supple. No JVD present.  Cardiovascular: Normal rate and regular rhythm.  Exam reveals no gallop and no friction rub.   No murmur heard. Pulmonary/Chest: Effort normal and breath sounds normal. No respiratory distress. She has no wheezes. She has no rales. She exhibits no tenderness.  Few crackles in bilateral  bases.  Abdominal: Soft. Bowel sounds are normal. There is no tenderness. There is no rebound and no guarding.  Musculoskeletal: Normal range of motion. She exhibits edema. She exhibits no tenderness.  3+ bilateral lower extremity edema. No asymmetry or tenderness. Distal pulses are intact.  Neurological: She is alert and oriented to person, place, and time.  Moves all extremities without deficit. Sensation fully intact.  Skin: Skin is warm and dry. Capillary refill takes less than 2 seconds. No rash noted. No erythema.  Psychiatric: She has a normal mood and affect. Her behavior is normal.  Nursing note and vitals reviewed.    ED Treatments / Results  Labs (all labs ordered are listed, but only abnormal results are displayed) Labs Reviewed  CBC - Abnormal; Notable for the following:       Result Value   WBC 3.8 (*)    Hemoglobin 11.0 (*)    HCT 34.3 (*)    [facility]  25.5 (*)    RDW 16.8 (*)    All other components within normal limits  COMPREHENSIVE METABOLIC PANEL - Abnormal; Notable for the following:    BUN 24 (*)    Anion gap 4 (*)    All other components within normal limits  URINALYSIS, ROUTINE W REFLEX MICROSCOPIC - Abnormal; Notable for the following:    Color, Urine STRAW (*)    Hgb urine dipstick SMALL (*)    Bacteria, UA RARE (*)    Squamous Epithelial / LPF 0-5 (*)    All other components within normal limits  BRAIN NATRIURETIC PEPTIDE - Abnormal; Notable for the following:    B Natriuretic Peptide 238.6 (*)    All other components within normal limits  LIPASE, BLOOD  I-STAT TROPOININ, ED    EKG  EKG Interpretation  Date/Time:  Monday November 06 2016 18:24:30 EDT Ventricular Rate:  70 PR Interval:    QRS Duration: 77 QT Interval:  371 QTC Calculation: 423 R Axis:   61 Text Interpretation:  Sinus rhythm Supraventricular bigeminy Sinus pause Nonspecific T abnormalities, lateral leads Baseline wander in lead(s) I III aVL V5 Confirmed by Lita Mains  MD, Isamar Wellbrock  ([id number]) on 11/06/2016 7:27:30 PM       Radiology Dg Chest 2 View  Result Date: 11/06/2016 CLINICAL DATA:  Abnormal EKG with shortness of breath EXAM: CHEST  2 VIEW COMPARISON:  05/31/2016 FINDINGS: Cardiac shadow is mildly enlarged but stable. Aortic calcifications are again seen. Fullness of the pulmonary artery is noted bilaterally but stable. No focal infiltrate or sizable effusion is seen. No acute bony abnormality is noted. IMPRESSION: No acute abnormality seen. Electronically Signed   By: Inez Catalina M.D.   On: 11/06/2016 19:19    Procedures Procedures (including critical care time)  Medications Ordered in ED Medications - No data to display   Initial Impression / Assessment and Plan / ED Course  I have reviewed the triage vital signs and the nursing notes.  Pertinent labs & imaging results that were available during my care of the patient were reviewed by me and considered in my medical decision making (see chart for details).     Patient's initial EKG with multiple PACs. Mild elevation in BNP. She has no complaints at this time. Vital signs remained stable. Repeat EKG is now in normal sinus rhythm. Patient will follow-up with Dr. Terrence Dupont. Return precautions have been given.  Final Clinical Impressions(s) / ED Diagnoses   Final diagnoses:  Irregular heart beats    New Prescriptions New Prescriptions   No medications on file     Julianne Rice, MD 11/06/16 2235
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
## 2016-11-08 ENCOUNTER — Other Ambulatory Visit (INDEPENDENT_AMBULATORY_CARE_PROVIDER_SITE_OTHER): Payer: Self-pay | Admitting: Physician Assistant

## 2016-11-08 ENCOUNTER — Telehealth (INDEPENDENT_AMBULATORY_CARE_PROVIDER_SITE_OTHER): Payer: Self-pay | Admitting: Physician Assistant

## 2016-11-08 DIAGNOSIS — H6123 Impacted cerumen, bilateral: Secondary | ICD-10-CM

## 2016-11-08 MED ORDER — CARBAMIDE PEROXIDE 6.5 % OT SOLN: 5.0000 [drp] | Freq: Two times a day (BID) | OTIC | 0 refills | Status: DC

## 2016-11-08 NOTE — Telephone Encounter (Signed)
I have read the ED note. Her heartbeat was found to be irregular with their first EKG and normalized with their second EKG. She was advised to go back to Dr. Terrence Dupont which is also what I recommend. I will send out the Debrox now for her ear wax. Debrox is an OTC product.

## 2016-11-08 NOTE — Telephone Encounter (Signed)
Sent to PCP. [name], CMA

## 2016-11-08 NOTE — Telephone Encounter (Signed)
Patient called yesterday left message on voicemail stated went to ER due to irregular  Heart beats needs to talk to Domenica Fail PA,  And also needs Rx for earwax and needs to discuss orders. Patient called today left message on voicemail to call her back today.  Please follow up with patient at ph: [phone] left this number on voicemail.

## 2016-11-08 NOTE — Progress Notes (Signed)
Cerumen impaction.

## 2016-11-08 NOTE — Telephone Encounter (Signed)
Patient informed. Angeliah Wisdom S Vondell Babers, CMA  

## 2016-11-22 ENCOUNTER — Telehealth (INDEPENDENT_AMBULATORY_CARE_PROVIDER_SITE_OTHER): Payer: Self-pay | Admitting: Physician Assistant

## 2016-11-22 NOTE — Telephone Encounter (Signed)
Per patient has blister on legs wants to know what she can do it to relieve the blister.  Offered an appt declined stated can't come needs to schedule transportation but to have Dr/nurse call her back.    Please follow up with patient

## 2016-11-22 NOTE — Telephone Encounter (Signed)
I am sorry but I can not send treatment without first properly evaluating patient. Please notify patient and have her come here or another provider as soon as she can.

## 2016-11-22 NOTE — Telephone Encounter (Signed)
In order for patient to be properly assessed and prescribed the correct treatment, she will need to come into the office. Notes states patient declined appointment. Will forward to PCP. [name], CMA

## 2016-11-24 ENCOUNTER — Telehealth (INDEPENDENT_AMBULATORY_CARE_PROVIDER_SITE_OTHER): Payer: Self-pay | Admitting: Physician Assistant

## 2016-11-24 ENCOUNTER — Other Ambulatory Visit (INDEPENDENT_AMBULATORY_CARE_PROVIDER_SITE_OTHER): Payer: Self-pay | Admitting: Physician Assistant

## 2016-11-24 DIAGNOSIS — Z760 Encounter for issue of repeat prescription: Secondary | ICD-10-CM

## 2016-11-24 MED ORDER — AMLODIPINE BESYLATE 2.5 MG PO TABS: 2.5000 mg | ORAL_TABLET | Freq: Every day | ORAL | 1 refills | Status: DC

## 2016-11-24 MED ORDER — OMEPRAZOLE 20 MG PO CPDR: 20.0000 mg | DELAYED_RELEASE_CAPSULE | Freq: Every day | ORAL | 3 refills | Status: DC

## 2016-11-24 NOTE — Telephone Encounter (Signed)
Patient left voicemessage at 524 pm on 11/24/2016 would like a call back regarding blisters on legs,  Stated no one has called her back and also needs to discuss her medication.  Please follow up with patient at [phone]

## 2016-11-24 NOTE — Telephone Encounter (Signed)
Sent to PCP. [name], CMA

## 2016-11-24 NOTE — Progress Notes (Signed)
Medication refill request.

## 2016-11-24 NOTE — Telephone Encounter (Signed)
Refilled and sent   

## 2016-11-24 NOTE — Telephone Encounter (Signed)
Patient called requesting refill on omeprazole (PRILOSEC) 20 MG  amLODipine (NORVASC) 2.5 MG tablet [[id number]]  Per patient has no refills left.  Please send to [facility] on [address],

## 2016-11-24 NOTE — Telephone Encounter (Signed)
Spoke with patient and transferred her to front desk to schedule an appointment. [name], CMA

## 2016-12-04 ENCOUNTER — Encounter (INDEPENDENT_AMBULATORY_CARE_PROVIDER_SITE_OTHER): Payer: Self-pay | Admitting: Physician Assistant

## 2016-12-04 ENCOUNTER — Ambulatory Visit (INDEPENDENT_AMBULATORY_CARE_PROVIDER_SITE_OTHER): Payer: Medicare HMO | Admitting: Physician Assistant

## 2016-12-04 VITALS — BP 177/70 | HR 52 | Temp 97.6°F | Wt 158.8 lb

## 2016-12-04 DIAGNOSIS — I872 Venous insufficiency (chronic) (peripheral): Secondary | ICD-10-CM

## 2016-12-04 DIAGNOSIS — H6120 Impacted cerumen, unspecified ear: Secondary | ICD-10-CM | POA: Insufficient documentation

## 2016-12-04 DIAGNOSIS — I10 Essential (primary) hypertension: Secondary | ICD-10-CM

## 2016-12-04 DIAGNOSIS — H6121 Impacted cerumen, right ear: Secondary | ICD-10-CM

## 2016-12-04 DIAGNOSIS — I739 Peripheral vascular disease, unspecified: Secondary | ICD-10-CM | POA: Diagnosis not present

## 2016-12-04 MED ORDER — CLINDAMYCIN PHOSPHATE 1 % EX GEL: Freq: Two times a day (BID) | CUTANEOUS | 0 refills | Status: DC

## 2016-12-04 NOTE — Patient Instructions (Addendum)
Please take your antihypertensives as directed and especially before coming for a medical appointment.    Peripheral Vascular Disease Peripheral vascular disease (PVD) is a disease of the blood vessels that are not part of your heart and brain. A simple term for PVD is poor circulation. In most cases, PVD narrows the blood vessels that carry blood from your heart to the rest of your body. This can result in a decreased supply of blood to your arms, legs, and internal organs, like your stomach or kidneys. However, it most often affects a person's lower legs and feet. There are two types of PVD.  Organic PVD. This is the more common type. It is caused by damage to the structure of blood vessels.  Functional PVD. This is caused by conditions that make blood vessels contract and tighten (spasm). Without treatment, PVD tends to get worse over time. PVD can also lead to acute ischemic limb. This is when an arm or limb suddenly has trouble getting enough blood. This is a medical emergency. Follow these instructions at home:  Take medicines only as told by your doctor.  Do not use any tobacco products, including cigarettes, chewing tobacco, or electronic cigarettes. If you need help quitting, ask your doctor.  Lose weight if you are overweight, and maintain a healthy weight as told by your doctor.  Eat a diet that is low in fat and cholesterol. If you need help, ask your doctor.  Exercise regularly. Ask your doctor for some good activities for you.  Take good care of your feet.  Wear comfortable shoes that fit well.  Check your feet often for any cuts or sores. Contact a doctor if:  You have cramps in your legs while walking.  You have leg pain when you are at rest.  You have coldness in a leg or foot.  Your skin changes.  You are unable to get or have an erection (erectile dysfunction).  You have cuts or sores on your feet that are not healing. Get help right away if:  Your arm  or leg turns cold and blue.  Your arms or legs become red, warm, swollen, painful, or numb.  You have chest pain or trouble breathing.  You suddenly have weakness in your face, arm, or leg.  You become very confused or you cannot speak.  You suddenly have a very bad headache.  You suddenly cannot see. This information is not intended to replace advice given to you by your health care provider. Make sure you discuss any questions you have with your health care provider. Document Released: 10/11/2009 Document Revised: 12/23/2015 Document Reviewed: 12/25/2013 Elsevier Interactive Patient Education  2017 [employer]   Hypertension Hypertension, commonly called high blood pressure, is when the force of blood pumping through the arteries is too strong. The arteries are the blood vessels that carry blood from the heart throughout the body. Hypertension forces the heart to work harder to pump blood and may cause arteries to become narrow or stiff. Having untreated or uncontrolled hypertension can cause heart attacks, strokes, kidney disease, and other problems. A blood pressure reading consists of a higher number over a lower number. Ideally, your blood pressure should be below 120/80. The first ("top") number is called the systolic pressure. It is a measure of the pressure in your arteries as your heart beats. The second ("bottom") number is called the diastolic pressure. It is a measure of the pressure in your arteries as the heart relaxes. What are the  causes? The cause of this condition is not known. What increases the risk? Some risk factors for high blood pressure are under your control. Others are not. Factors you can change   Smoking.  Having type 2 diabetes mellitus, high cholesterol, or both.  Not getting enough exercise or physical activity.  Being overweight.  Having too much fat, sugar, calories, or salt (sodium) in your diet.  Drinking too much alcohol. Factors that are  difficult or impossible to change   Having chronic kidney disease.  Having a family history of high blood pressure.  Age. Risk increases with age.  Race. You may be at higher risk if you are African-American.  Gender. Men are at higher risk than women before age 4. After age 31, women are at higher risk than men.  Having obstructive sleep apnea.  Stress. What are the signs or symptoms? Extremely high blood pressure (hypertensive crisis) may cause:  Headache.  Anxiety.  Shortness of breath.  Nosebleed.  Nausea and vomiting.  Severe chest pain.  Jerky movements you cannot control (seizures). How is this diagnosed? This condition is diagnosed by measuring your blood pressure while you are seated, with your arm resting on a surface. The cuff of the blood pressure monitor will be placed directly against the skin of your upper arm at the level of your heart. It should be measured at least twice using the same arm. Certain conditions can cause a difference in blood pressure between your right and left arms. Certain factors can cause blood pressure readings to be lower or higher than normal (elevated) for a short period of time:  When your blood pressure is higher when you are in a health care provider's office than when you are at home, this is called white coat hypertension. Most people with this condition do not need medicines.  When your blood pressure is higher at home than when you are in a health care provider's office, this is called masked hypertension. Most people with this condition may need medicines to control blood pressure. If you have a high blood pressure reading during one visit or you have normal blood pressure with other risk factors:  You may be asked to return on a different day to have your blood pressure checked again.  You may be asked to monitor your blood pressure at home for 1 week or longer. If you are diagnosed with hypertension, you may have other  blood or imaging tests to help your health care provider understand your overall risk for other conditions. How is this treated? This condition is treated by making healthy lifestyle changes, such as eating healthy foods, exercising more, and reducing your alcohol intake. Your health care provider may prescribe medicine if lifestyle changes are not enough to get your blood pressure under control, and if:  Your systolic blood pressure is above 130.  Your diastolic blood pressure is above 80. Your personal target blood pressure may vary depending on your medical conditions, your age, and other factors. Follow these instructions at home: Eating and drinking   Eat a diet that is high in fiber and potassium, and low in sodium, added sugar, and fat. An example eating plan is called the DASH (Dietary Approaches to Stop Hypertension) diet. To eat this way:  Eat plenty of fresh fruits and vegetables. Try to fill half of your plate at each meal with fruits and vegetables.  Eat whole grains, such as whole wheat pasta, brown rice, or whole grain bread. Fill about  one quarter of your plate with whole grains.  Eat or drink low-fat dairy products, such as skim milk or low-fat yogurt.  Avoid fatty cuts of meat, processed or cured meats, and poultry with skin. Fill about one quarter of your plate with lean proteins, such as fish, chicken without skin, beans, eggs, and tofu.  Avoid premade and processed foods. These tend to be higher in sodium, added sugar, and fat.  Reduce your daily sodium intake. Most people with hypertension should eat less than 1,500 mg of sodium a day.  Limit alcohol intake to no more than 1 drink a day for nonpregnant women and 2 drinks a day for men. One drink equals 12 oz of beer, 5 oz of wine, or 1 oz of hard liquor. Lifestyle   Work with your health care provider to maintain a healthy body weight or to lose weight. Ask what an ideal weight is for you.  Get at least 30 minutes  of exercise that causes your heart to beat faster (aerobic exercise) most days of the week. Activities may include walking, swimming, or biking.  Include exercise to strengthen your muscles (resistance exercise), such as pilates or lifting weights, as part of your weekly exercise routine. Try to do these types of exercises for 30 minutes at least 3 days a week.  Do not use any products that contain nicotine or tobacco, such as cigarettes and e-cigarettes. If you need help quitting, ask your health care provider.  Monitor your blood pressure at home as told by your health care provider.  Keep all follow-up visits as told by your health care provider. This is important. Medicines   Take over-the-counter and prescription medicines only as told by your health care provider. Follow directions carefully. Blood pressure medicines must be taken as prescribed.  Do not skip doses of blood pressure medicine. Doing this puts you at risk for problems and can make the medicine less effective.  Ask your health care provider about side effects or reactions to medicines that you should watch for. Contact a health care provider if:  You think you are having a reaction to a medicine you are taking.  You have headaches that keep coming back (recurring).  You feel dizzy.  You have swelling in your ankles.  You have trouble with your vision. Get help right away if:  You develop a severe headache or confusion.  You have unusual weakness or numbness.  You feel faint.  You have severe pain in your chest or abdomen.  You vomit repeatedly.  You have trouble breathing. Summary  Hypertension is when the force of blood pumping through your arteries is too strong. If this condition is not controlled, it may put you at risk for serious complications.  Your personal target blood pressure may vary depending on your medical conditions, your age, and other factors. For most people, a normal blood pressure is  less than 120/80.  Hypertension is treated with lifestyle changes, medicines, or a combination of both. Lifestyle changes include weight loss, eating a healthy, low-sodium diet, exercising more, and limiting alcohol. This information is not intended to replace advice given to you by your health care provider. Make sure you discuss any questions you have with your health care provider. Document Released: 07/17/2005 Document Revised: 06/14/2016 Document Reviewed: 06/14/2016 Elsevier Interactive Patient Education  2017 [employer].

## 2016-12-04 NOTE — Progress Notes (Signed)
Subjective:  Patient ID: [name], female    DOB: April 30, 1930  Age: 83 y.o. MRN: [id number]  CC: Blisters on RLE  HPI [name] is a 83 y.o. female with multiple comorbidities presents to address RLE "blisters". Blister onset approximately 2-3 weeks ago. There is associated darkening of the skin of bilateral lower extremities, bilateral LE edema, and HTN. BP found to be elevated during this encounter. Has not taken antihypertensive this morning. Reports BP usually at 742-595 systolic at home. Has been eating more [place] food and [place] food as of late. Denies CP, current palpitations, orthopnea, SOB, current HA, abdominal pain, f/c/n/v, or GI/GU sxs. Would also like an ear lavage today. Has been using cerumen removal drops.   Past Medical History:  Diagnosis Date  . CAP (community acquired pneumonia) 09/21/2015  . Chronic kidney disease, stage III (moderate)   . Encounter for long-term (current) use of other medications   . Glaucoma (increased eye pressure)   . Head pain   . Hypertension   . Impaired glucose tolerance test   . Insomnia, unspecified   . Morbid obesity ([place])   . Neck pain   . Osteoarthrosis, unspecified whether generalized or localized, other specified sites   . Other lymphedema   . Pain in joint, multiple sites   . Primary open-angle glaucoma(365.11)     Outpatient Medications Prior to Visit  Medication Sig Dispense Refill  . ACAI PO Take 1 capsule by mouth 2 (two) times daily.    Marland Kitchen amLODipine (NORVASC) 2.5 MG tablet Take 1 tablet (2.5 mg total) by mouth daily. 90 tablet 1  . aspirin EC 81 MG tablet Take 81 mg by mouth daily.    . brimonidine (ALPHAGAN) 0.15 % ophthalmic solution Place 1 drop into the left eye 2 (two) times daily.     . carbamide peroxide (DEBROX) 6.5 % otic solution Place 5 drops into both ears 2 (two) times daily. 15 mL 0  . Cyanocobalamin (VITAMIN B-12 PO) Take 1 tablet by mouth daily.    Marland Kitchen GARLIC PO Take 6,387 mg by mouth daily.      Marland Kitchen ibuprofen (ADVIL,MOTRIN) 800 MG tablet Take 800 mg by mouth every 8 (eight) hours as needed for fever, headache, mild pain, moderate pain or cramping.    Marland Kitchen KLOR-CON M20 20 MEQ tablet Take 20 mEq by mouth daily.    Marland Kitchen losartan-hydrochlorothiazide (HYZAAR) 100-25 MG tablet Take 1 tablet by mouth daily.    . Multiple Vitamin (MULTIVITAMIN WITH MINERALS) TABS Take 1 tablet by mouth daily.    Marland Kitchen omeprazole (PRILOSEC) 20 MG capsule Take 1 capsule (20 mg total) by mouth daily. 30 capsule 3  . Polyethyl Glycol-Propyl Glycol 0.4-0.3 % SOLN Place 1 drop into both eyes 3 (three) times daily as needed (for lubricating eyes).    . prednisoLONE acetate (PRED FORTE) 1 % ophthalmic suspension Place 1 drop into the left eye daily.     Marland Kitchen losartan-hydrochlorothiazide (HYZAAR) 100-12.5 MG tablet Take 1 tablet by mouth daily. (Patient not taking: Reported on 11/06/2016) 90 tablet 1   No facility-administered medications prior to visit.      ROS Review of Systems  Constitutional: Negative for chills, fever and malaise/fatigue.  HENT: Positive for ear pain (right ear discomfort).   Eyes: Negative for blurred vision.  Respiratory: Negative for shortness of breath.   Cardiovascular: Positive for leg swelling. Negative for chest pain and palpitations.  Gastrointestinal: Negative for abdominal pain and nausea.  Genitourinary: Negative for dysuria  and hematuria.  Musculoskeletal: Negative for joint pain and myalgias.  Skin: Negative for rash.       "blisters" on RLE  Neurological: Negative for tingling and headaches.  Psychiatric/Behavioral: Negative for depression. The patient is not nervous/anxious.     Objective:  BP (!) 177/70   Pulse (!) 52   Temp 97.6 F (36.4 C) (Oral)   Wt 158 lb 12.8 oz (72 kg)   SpO2 99%   BMI 34.36 kg/m   BP/Weight 12/04/2016 08/06/5535 11/05/2705  Systolic BP 867 544 920  Diastolic BP 70 90 81  Wt. (Lbs) 158.8 166 166.6  BMI 34.36 35.92 36.05      Physical Exam   Constitutional: She is oriented to person, place, and time.  Well developed, overweight, NAD, polite  HENT:  Head: Normocephalic and atraumatic.  Right ear cerumen impaction  Eyes: No scleral icterus.  Neck: Normal range of motion.  Cardiovascular: Normal rate, regular rhythm and normal heart sounds.   Bilateral 2+ pitting edema  Pulmonary/Chest: Effort normal and breath sounds normal.  Musculoskeletal: She exhibits no edema.  Neurological: She is alert and oriented to person, place, and time.  Skin: Skin is warm and dry. No rash noted. No erythema. No pallor.  Venous stasis changes of the bilateral lower extremities. Bilateral hyperpimentation with thickened epidermis. RLE with an area of superficial erosion in the anterior aspect of leg. No cellulitis or streaking. No pustular suppuration or bleeding.  Psychiatric: She has a normal mood and affect. Her behavior is normal. Thought content normal.  Vitals reviewed.    Assessment & Plan:   1. Venous stasis dermatitis of both lower extremities - Begin clindamycin (CLINDAGEL) 1 % gel; Apply topically 2 (two) times daily.  Dispense: 30 g; Refill: 0 - Compression stockings  2. Hypertension, unspecified type - Advised to take medications as directed. I clarified that she should be taking her Losartan/HCTZ 100-25 mg and not her 100-12.5 mg dose.  3. PVD (peripheral vascular disease) ([facility]) - Compression stockings. Leg elevation. Take anti-hypertensives as directed. Continue f/u care with cardiology.  4. Impacted cerumen of right ear - Ear Lavage performed in clinic today.   Meds ordered this encounter  Medications  . clindamycin (CLINDAGEL) 1 % gel    Sig: Apply topically 2 (two) times daily.    Dispense:  30 g    Refill:  0    Order Specific Question:   Supervising Provider    Answer:   Tresa Garter [[id number]]    Follow-up: Return in about 4 weeks (around 01/01/2017) for HTN, PVD.   Clent Demark PA

## 2016-12-06 ENCOUNTER — Ambulatory Visit (INDEPENDENT_AMBULATORY_CARE_PROVIDER_SITE_OTHER): Payer: Medicare HMO | Admitting: Physician Assistant

## 2017-01-01 ENCOUNTER — Encounter (INDEPENDENT_AMBULATORY_CARE_PROVIDER_SITE_OTHER): Payer: Self-pay | Admitting: Physician Assistant

## 2017-01-01 ENCOUNTER — Ambulatory Visit (INDEPENDENT_AMBULATORY_CARE_PROVIDER_SITE_OTHER): Payer: Medicare HMO | Admitting: Physician Assistant

## 2017-01-01 VITALS — BP 166/68 | HR 62 | Temp 97.1°F | Wt 149.0 lb

## 2017-01-01 DIAGNOSIS — H6121 Impacted cerumen, right ear: Secondary | ICD-10-CM

## 2017-01-01 DIAGNOSIS — I10 Essential (primary) hypertension: Secondary | ICD-10-CM | POA: Diagnosis not present

## 2017-01-01 DIAGNOSIS — S0300XA Dislocation of jaw, unspecified side, initial encounter: Secondary | ICD-10-CM | POA: Diagnosis not present

## 2017-01-01 DIAGNOSIS — H6123 Impacted cerumen, bilateral: Secondary | ICD-10-CM

## 2017-01-01 MED ORDER — SPIRONOLACTONE 25 MG PO TABS: 25.0000 mg | ORAL_TABLET | Freq: Every day | ORAL | 3 refills | Status: DC

## 2017-01-01 MED ORDER — CYCLOBENZAPRINE HCL 5 MG PO TABS: 5.0000 mg | ORAL_TABLET | Freq: Every day | ORAL | 0 refills | Status: DC

## 2017-01-01 MED ORDER — CARBAMIDE PEROXIDE 6.5 % OT SOLN: 5.0000 [drp] | Freq: Two times a day (BID) | OTIC | 0 refills | Status: DC

## 2017-01-01 NOTE — Progress Notes (Signed)
Subjective:  Patient ID: [name], female    DOB: [birth year]  Age: 83 y.o. MRN: [id number]  CC: HTN and PVD  HPI [name] is a 83 y.o. female with a PMH of HTN, CKD3, glaucoma, and insomnia presents on follow up of her HTN and LE edema. BP and LE edema remains the same. Currently taking Hyzaar and Amlodipine 2.5 mg. Says compression stocking and elevation of legs helps with the swelling. Does not endorse any other cardiovascular complaints.    Pt mostly concerned with pain and popping in the right side of jaw and/or ear. She is not certain if it is the ear or jaw. Worse with eating solid foods. No associated dizziness, loss of audition, tinnitus, fever, chills, facial pain, facial paralysis, or rash. Has some muscular tension of the right side of neck possibly attribured to her ear? Pt does not endorse any other associated complaints.      Outpatient Medications Prior to Visit  Medication Sig Dispense Refill  . ACAI PO Take 1 capsule by mouth 2 (two) times daily.    Marland Kitchen amLODipine (NORVASC) 2.5 MG tablet Take 1 tablet (2.5 mg total) by mouth daily. 90 tablet 1  . aspirin EC 81 MG tablet Take 81 mg by mouth daily.    . brimonidine (ALPHAGAN) 0.15 % ophthalmic solution Place 1 drop into the left eye 2 (two) times daily.     . carbamide peroxide (DEBROX) 6.5 % otic solution Place 5 drops into both ears 2 (two) times daily. 15 mL 0  . clindamycin (CLINDAGEL) 1 % gel Apply topically 2 (two) times daily. 30 g 0  . Cyanocobalamin (VITAMIN B-12 PO) Take 1 tablet by mouth daily.    Marland Kitchen GARLIC PO Take 5,053 mg by mouth daily.     Marland Kitchen ibuprofen (ADVIL,MOTRIN) 800 MG tablet Take 800 mg by mouth every 8 (eight) hours as needed for fever, headache, mild pain, moderate pain or cramping.    Marland Kitchen losartan-hydrochlorothiazide (HYZAAR) 100-25 MG tablet Take 1 tablet by mouth daily.    . Multiple Vitamin (MULTIVITAMIN WITH MINERALS) TABS Take 1 tablet by mouth daily.    Marland Kitchen omeprazole (PRILOSEC) 20 MG  capsule Take 1 capsule (20 mg total) by mouth daily. 30 capsule 3  . Polyethyl Glycol-Propyl Glycol 0.4-0.3 % SOLN Place 1 drop into both eyes 3 (three) times daily as needed (for lubricating eyes).    . prednisoLONE acetate (PRED FORTE) 1 % ophthalmic suspension Place 1 drop into the left eye daily.     Marland Kitchen KLOR-CON M20 20 MEQ tablet Take 20 mEq by mouth daily.     No facility-administered medications prior to visit.      ROS Review of Systems  Constitutional: Negative for chills, fever and malaise/fatigue.  Eyes: Negative for blurred vision.  Respiratory: Negative for shortness of breath.   Cardiovascular: Positive for leg swelling. Negative for chest pain and palpitations.  Gastrointestinal: Negative for abdominal pain and nausea.  Genitourinary: Negative for dysuria and hematuria.  Musculoskeletal: Negative for joint pain and myalgias.  Skin: Negative for rash.  Neurological: Negative for tingling and headaches.  Psychiatric/Behavioral: Negative for depression. The patient is not nervous/anxious.     Objective:  BP (!) 166/68   Pulse 62   Temp 97.1 F (36.2 C) (Oral)   Wt 149 lb (67.6 kg)   LMP  (Exact Date)   SpO2 99%   BMI 32.24 kg/m   BP/Weight 01/01/2017 04/06/6733 08/08/3788  Systolic BP 240 973 532  Diastolic BP 68 70 90  Wt. (Lbs) 149 158.8 166  BMI 32.24 34.36 35.92      Physical Exam  Constitutional: She is oriented to person, place, and time.  Well developed, well nourished, NAD, polite  HENT:  Head: Normocephalic and atraumatic.  Right ear with cerumen impaction. Mild dislocation of the right TMJ.  Eyes: No scleral icterus.  Neck: Normal range of motion. Neck supple.  Cardiovascular: Normal rate, regular rhythm and normal heart sounds.   Pulmonary/Chest: Effort normal and breath sounds normal.  Abdominal: Soft. Bowel sounds are normal. There is no tenderness.  Musculoskeletal:  Increased muscular tonicity of the right and left upper trapezius. Mild to  moderate upper T spine kyphosis. L > R LE edema somewhat better to previous encounter, however, she has been wearing compression stockings today.  Lymphadenopathy:    She has no cervical adenopathy.  Neurological: She is alert and oriented to person, place, and time. No cranial nerve deficit.  Skin: Skin is warm and dry. No rash noted. No erythema. No pallor.  Psychiatric: She has a normal mood and affect. Her behavior is normal. Thought content normal.  Vitals reviewed.    Assessment & Plan:   1. Hypertension, unspecified type - Basic Metabolic Panel - TSH - Begin spironolactone (ALDACTONE) 25 MG tablet; Take 1 tablet (25 mg total) by mouth daily.  Dispense: 90 tablet; Refill: 3 - Stop KCl  2. Dislocation of temporomandibular joint, initial encounter - Begin cyclobenzaprine (FLEXERIL) 5 MG tablet; Take 1 tablet (5 mg total) by mouth at bedtime.  Dispense: 10 tablet; Refill: 0  3. Cerumen Impaction - Begin Debrox   Meds ordered this encounter  Medications  . spironolactone (ALDACTONE) 25 MG tablet    Sig: Take 1 tablet (25 mg total) by mouth daily.    Dispense:  90 tablet    Refill:  3    Order Specific Question:   Supervising Provider    Answer:   Tresa Garter [id number]  . cyclobenzaprine (FLEXERIL) 5 MG tablet    Sig: Take 1 tablet (5 mg total) by mouth at bedtime.    Dispense:  10 tablet    Refill:  0    Order Specific Question:   Supervising Provider    Answer:   Tresa Garter [id number]    Follow-up: Return in about 2 weeks (around 01/15/2017).   Clent Demark PA

## 2017-01-01 NOTE — Patient Instructions (Signed)
Temporomandibular Joint Syndrome Temporomandibular joint (TMJ) syndrome is a condition that affects the joints between your jaw and your skull. The TMJs are located near your ears and allow your jaw to open and close. These joints and the nearby muscles are involved in all movements of the jaw. People with TMJ syndrome have pain in the area of these joints and muscles. Chewing, biting, or other movements of the jaw can be difficult or painful. TMJ syndrome can be caused by various things. In many cases, the condition is mild and goes away within a few weeks. For some people, the condition can become a long-term problem. What are the causes? Possible causes of TMJ syndrome include:  Grinding your teeth or clenching your jaw. Some people do this when they are under stress.  Arthritis.  Injury to the jaw.  Head or neck injury.  Teeth or dentures that are not aligned well.  In some cases, the cause of TMJ syndrome may not be known. What are the signs or symptoms? The most common symptom is an aching pain on the side of the head in the area of the TMJ. Other symptoms may include:  Pain when moving your jaw, such as when chewing or biting.  Being unable to open your jaw all the way.  Making a clicking sound when you open your mouth.  Headache.  Earache.  Neck or shoulder pain.  How is this diagnosed? Diagnosis can usually be made based on your symptoms, your medical history, and a physical exam. Your health care provider may check the range of motion of your jaw. Imaging tests, such as X-rays or an MRI, are sometimes done. You may need to see your dentist to determine if your teeth and jaw are lined up correctly. How is this treated? TMJ syndrome often goes away on its own. If treatment is needed, the options may include:  Eating soft foods and applying ice or heat.  Medicines to relieve pain or inflammation.  Medicines to relax the muscles.  A splint, bite plate, or mouthpiece  to prevent teeth grinding or jaw clenching.  Relaxation techniques or counseling to help reduce stress.  Transcutaneous electrical nerve stimulation (TENS). This helps to relieve pain by applying an electrical current through the skin.  Acupuncture. This is sometimes helpful to relieve pain.  Jaw surgery. This is rarely needed.  Follow these instructions at home:  Take medicines only as directed by your health care provider.  Eat a soft diet if you are having trouble chewing.  Apply ice to the painful area. ? Put ice in a plastic bag. ? Place a towel between your skin and the bag. ? Leave the ice on for 20 minutes, 2-3 times a day.  Apply a warm compress to the painful area as directed.  Massage your jaw area and perform any jaw stretching exercises as recommended by your health care provider.  If you were given a mouthpiece or bite plate, wear it as directed.  Avoid foods that require a lot of chewing. Do not chew gum.  Keep all follow-up visits as directed by your health care provider. This is important. Contact a health care provider if:  You are having trouble eating.  You have new or worsening symptoms. Get help right away if:  Your jaw locks open or closed. This information is not intended to replace advice given to you by your health care provider. Make sure you discuss any questions you have with your health care provider. Document   Released: 04/11/2001 Document Revised: 03/16/2016 Document Reviewed: 02/19/2014 Elsevier Interactive Patient Education  2018 [employer]  

## 2017-01-03 ENCOUNTER — Encounter (INDEPENDENT_AMBULATORY_CARE_PROVIDER_SITE_OTHER): Payer: Self-pay | Admitting: Physician Assistant

## 2017-01-03 LAB — BASIC METABOLIC PANEL
BUN / CREAT RATIO: 21 (ref 12–28)
BUN: 23 mg/dL (ref 8–27)
CO2: 26 mmol/L (ref 18–29)
Calcium: 9.5 mg/dL (ref 8.7–10.3)
Chloride: 104 mmol/L (ref 96–106)
Creatinine, Ser: 1.08 mg/dL — ABNORMAL HIGH (ref 0.57–1.00)
GFR, EST AFRICAN AMERICAN: 54 mL/min/{1.73_m2} — AB (ref >59)
GFR, EST NON AFRICAN AMERICAN: 47 mL/min/{1.73_m2} — AB (ref >59)
Glucose: 88 mg/dL (ref 65–99)
POTASSIUM: 4.1 mmol/L (ref 3.5–5.2)
SODIUM: 142 mmol/L (ref 134–144)

## 2017-01-03 LAB — TSH: TSH: 1.44 u[IU]/mL (ref 0.450–4.500)

## 2017-01-05 ENCOUNTER — Encounter (INDEPENDENT_AMBULATORY_CARE_PROVIDER_SITE_OTHER): Payer: Self-pay

## 2017-01-18 ENCOUNTER — Ambulatory Visit (INDEPENDENT_AMBULATORY_CARE_PROVIDER_SITE_OTHER): Payer: Medicare HMO | Admitting: Physician Assistant

## 2017-01-22 ENCOUNTER — Encounter (INDEPENDENT_AMBULATORY_CARE_PROVIDER_SITE_OTHER): Payer: Self-pay | Admitting: Physician Assistant

## 2017-01-22 ENCOUNTER — Ambulatory Visit (INDEPENDENT_AMBULATORY_CARE_PROVIDER_SITE_OTHER): Payer: Medicare HMO | Admitting: Physician Assistant

## 2017-01-22 VITALS — BP 169/96 | HR 53 | Temp 97.5°F | Wt 142.4 lb

## 2017-01-22 DIAGNOSIS — H7291 Unspecified perforation of tympanic membrane, right ear: Secondary | ICD-10-CM

## 2017-01-22 DIAGNOSIS — I739 Peripheral vascular disease, unspecified: Secondary | ICD-10-CM | POA: Diagnosis not present

## 2017-01-22 DIAGNOSIS — H6120 Impacted cerumen, unspecified ear: Secondary | ICD-10-CM | POA: Diagnosis not present

## 2017-01-22 DIAGNOSIS — I10 Essential (primary) hypertension: Secondary | ICD-10-CM | POA: Diagnosis not present

## 2017-01-22 DIAGNOSIS — Z79899 Other long term (current) drug therapy: Secondary | ICD-10-CM

## 2017-01-22 DIAGNOSIS — Z23 Encounter for immunization: Secondary | ICD-10-CM

## 2017-01-22 MED ORDER — PNEUMOCOCCAL 13-VAL CONJ VACC IM SUSP: 0.5000 mL | INTRAMUSCULAR | 0 refills | Status: DC

## 2017-01-22 MED ORDER — PNEUMOCOCCAL 13-VAL CONJ VACC IM SUSP: 0.5000 mL | INTRAMUSCULAR | Status: DC

## 2017-01-22 NOTE — Progress Notes (Signed)
Subjective:  Patient ID: [name], female    DOB: [birth year]  Age: 83 y.o. MRN: [id number]  CC: f/u HTN and PVD  HPI [name] is a 83 y.o. female with a PMH of multiple comorbidities presents to f/u on HTN and PVD. Was previously found to have moderately severe pitting edema. Patient has since been taking spironolactone in addition to HCTZ. Has noticed swelling has decreased significantly and can see her malleolus now. However there is still edema and has to wear compression stockings. Reports blood pressure at home ranges from 834-196 systolic. She thinks her BP is up today because of ongoing family issues that induce stress.    Was seen at previous visit for TMJ dislocation. Says cyclobenzaprine has reduced pain significantly. She was also assessed to have cerumen impaction at the last visit. Says debrox has helped dissolve wax but after a while of using Debrox, she began to feel pain. Used debrox as much as she can be had to stop due to right ear pain. Still hears some fizzing in the ear despite stopping Debrox. Does not endorse complete loss of audition, vertigo, or headache. Does not endorse any other symptoms or complaints.    Outpatient Medications Prior to Visit  Medication Sig Dispense Refill  . ACAI PO Take 1 capsule by mouth 2 (two) times daily.    Marland Kitchen amLODipine (NORVASC) 2.5 MG tablet Take 1 tablet (2.5 mg total) by mouth daily. 90 tablet 1  . aspirin EC 81 MG tablet Take 81 mg by mouth daily.    . brimonidine (ALPHAGAN) 0.15 % ophthalmic solution Place 1 drop into the left eye 2 (two) times daily.     . carbamide peroxide (DEBROX) 6.5 % otic solution Place 5 drops into both ears 2 (two) times daily. 15 mL 0  . clindamycin (CLINDAGEL) 1 % gel Apply topically 2 (two) times daily. 30 g 0  . Cyanocobalamin (VITAMIN B-12 PO) Take 1 tablet by mouth daily.    . cyclobenzaprine (FLEXERIL) 5 MG tablet Take 1 tablet (5 mg total) by mouth at bedtime. 10 tablet 0  . GARLIC PO Take  2,229 mg by mouth daily.     Marland Kitchen ibuprofen (ADVIL,MOTRIN) 800 MG tablet Take 800 mg by mouth every 8 (eight) hours as needed for fever, headache, mild pain, moderate pain or cramping.    Marland Kitchen losartan-hydrochlorothiazide (HYZAAR) 100-25 MG tablet Take 1 tablet by mouth daily.    . Multiple Vitamin (MULTIVITAMIN WITH MINERALS) TABS Take 1 tablet by mouth daily.    Marland Kitchen omeprazole (PRILOSEC) 20 MG capsule Take 1 capsule (20 mg total) by mouth daily. 30 capsule 3  . Polyethyl Glycol-Propyl Glycol 0.4-0.3 % SOLN Place 1 drop into both eyes 3 (three) times daily as needed (for lubricating eyes).    . prednisoLONE acetate (PRED FORTE) 1 % ophthalmic suspension Place 1 drop into the left eye daily.     Marland Kitchen spironolactone (ALDACTONE) 25 MG tablet Take 1 tablet (25 mg total) by mouth daily. 90 tablet 3   No facility-administered medications prior to visit.      ROS Review of Systems  Constitutional: Negative for chills, fever and malaise/fatigue.  HENT: Positive for ear pain.   Eyes: Negative for blurred vision.  Respiratory: Negative for shortness of breath.   Cardiovascular: Positive for leg swelling. Negative for chest pain and palpitations.  Gastrointestinal: Negative for abdominal pain and nausea.  Genitourinary: Negative for dysuria and hematuria.  Musculoskeletal: Negative for joint pain and myalgias.  Skin: Negative for rash.  Neurological: Negative for tingling and headaches.  Psychiatric/Behavioral: Negative for depression. The patient is not nervous/anxious.     Objective:  BP (!) 169/96 (BP Location: Left Arm, Patient Position: Sitting, Cuff Size: Normal)   Pulse (!) 53   Temp 97.5 F (36.4 C) (Oral)   Wt 142 lb 6.4 oz (64.6 kg)   SpO2 98%   BMI 30.82 kg/m   BP/Weight 01/22/2017 11/05/2498 10/05/486  Systolic BP 891 694 503  Diastolic BP 96 68 70  Wt. (Lbs) 142.4 149 158.8  BMI 30.82 32.24 34.36      Physical Exam  Constitutional: She is oriented to person, place, and time.   Elderly, frail, wearing compression stockings, NAD, polite  HENT:  Head: Normocephalic and atraumatic.  Right ear with minimal cerumen in ear canal. TM is visualized and reveals a perforation  Eyes: No scleral icterus.  Neck: Normal range of motion. Neck supple.  Cardiovascular: Normal rate, regular rhythm and normal heart sounds.   Pulmonary/Chest: Effort normal and breath sounds normal.  Musculoskeletal:  Bilateral LE 1+ pitting edema   Neurological: She is alert and oriented to person, place, and time. No cranial nerve deficit. Coordination normal.  Skin: Skin is warm and dry. No rash noted. No erythema. No pallor.  Psychiatric: She has a normal mood and affect. Her behavior is normal. Thought content normal.  Vitals reviewed.    Assessment & Plan:   1. Essential hypertension - Uncontrolled in clinic today. Patient is adamant that her blood pressure goes down to 888 systolic when at home. I have asked that she keep a blood pressure log to present to me when she returns in two months.   2. PVD (peripheral vascular disease) (HCC) - Greatly improving on spironolactone  3. Perforation of right tympanic membrane - Advised patient not to use any more ear drops of any kind. - Ambulatory referral to ENT  4. Impacted cerumen, unspecified laterality - Resolved with debrox  5. High risk medication use - Basic Metabolic Panel. Check potassium level.  6. Need for pneumococcal vaccination - pneumococcal 13-valent conjugate vaccine (PREVNAR 13) SUSP injection; Inject 0.5 mLs into the muscle tomorrow at 10 am.  Dispense: 0.5 mL; Refill: 0   Meds ordered this encounter  Medications  . pneumococcal 13-valent conjugate vaccine (PREVNAR 13) SUSP injection    Sig: Inject 0.5 mLs into the muscle tomorrow at 10 am.    Dispense:  0.5 mL    Refill:  0    Order Specific Question:   Supervising Provider    Answer:   Tresa Garter [[id number]]    Follow-up: Return in about 2 months  (around 03/24/2017) for Bring blood pressure log.   Clent Demark PA

## 2017-01-22 NOTE — Patient Instructions (Addendum)
Managing Your Hypertension Hypertension is commonly called high blood pressure. This is when the force of your blood pressing against the walls of your arteries is too strong. Arteries are blood vessels that carry blood from your heart throughout your body. Hypertension forces the heart to work harder to pump blood, and may cause the arteries to become narrow or stiff. Having untreated or uncontrolled hypertension can cause heart attack, stroke, kidney disease, and other problems. What are blood pressure readings? A blood pressure reading consists of a higher number over a lower number. Ideally, your blood pressure should be below 120/80. The first ("top") number is called the systolic pressure. It is a measure of the pressure in your arteries as your heart beats. The second ("bottom") number is called the diastolic pressure. It is a measure of the pressure in your arteries as the heart relaxes. What does my blood pressure reading mean? Blood pressure is classified into four stages. Based on your blood pressure reading, your health care provider may use the following stages to determine what type of treatment you need, if any. Systolic pressure and diastolic pressure are measured in a unit called mm Hg. Normal  Systolic pressure: below 120.  Diastolic pressure: below 80. Elevated  Systolic pressure: 120-129.  Diastolic pressure: below 80. Hypertension stage 1  Systolic pressure: 130-139.  Diastolic pressure: 80-89. Hypertension stage 2  Systolic pressure: 140 or above.  Diastolic pressure: 90 or above. What health risks are associated with hypertension? Managing your hypertension is an important responsibility. Uncontrolled hypertension can lead to:  A heart attack.  A stroke.  A weakened blood vessel (aneurysm).  Heart failure.  Kidney damage.  Eye damage.  Metabolic syndrome.  Memory and concentration problems.  What changes can I make to manage my  hypertension? Hypertension can be managed by making lifestyle changes and possibly by taking medicines. Your health care provider will help you make a plan to bring your blood pressure within a normal range. Eating and drinking  Eat a diet that is high in fiber and potassium, and low in salt (sodium), added sugar, and fat. An example eating plan is called the DASH (Dietary Approaches to Stop Hypertension) diet. To eat this way: ? Eat plenty of fresh fruits and vegetables. Try to fill half of your plate at each meal with fruits and vegetables. ? Eat whole grains, such as whole wheat pasta, brown rice, or whole grain bread. Fill about one quarter of your plate with whole grains. ? Eat low-fat diary products. ? Avoid fatty cuts of meat, processed or cured meats, and poultry with skin. Fill about one quarter of your plate with lean proteins such as fish, chicken without skin, beans, eggs, and tofu. ? Avoid premade and processed foods. These tend to be higher in sodium, added sugar, and fat.  Reduce your daily sodium intake. Most people with hypertension should eat less than 1,500 mg of sodium a day.  Limit alcohol intake to no more than 1 drink a day for nonpregnant women and 2 drinks a day for men. One drink equals 12 oz of beer, 5 oz of wine, or 1 oz of hard liquor. Lifestyle  Work with your health care provider to maintain a healthy body weight, or to lose weight. Ask what an ideal weight is for you.  Get at least 30 minutes of exercise that causes your heart to beat faster (aerobic exercise) most days of the week. Activities may include walking, swimming, or biking.  Include exercise   to strengthen your muscles (resistance exercise), such as weight lifting, as part of your weekly exercise routine. Try to do these types of exercises for 30 minutes at least 3 days a week.  Do not use any products that contain nicotine or tobacco, such as cigarettes and e-cigarettes. If you need help quitting, ask  your health care provider.  Control any long-term (chronic) conditions you have, such as high cholesterol or diabetes. Monitoring  Monitor your blood pressure at home as told by your health care provider. Your personal target blood pressure may vary depending on your medical conditions, your age, and other factors.  Have your blood pressure checked regularly, as often as told by your health care provider. Working with your health care provider  Review all the medicines you take with your health care provider because there may be side effects or interactions.  Talk with your health care provider about your diet, exercise habits, and other lifestyle factors that may be contributing to hypertension.  Visit your health care provider regularly. Your health care provider can help you create and adjust your plan for managing hypertension. Will I need medicine to control my blood pressure? Your health care provider may prescribe medicine if lifestyle changes are not enough to get your blood pressure under control, and if:  Your systolic blood pressure is 130 or higher.  Your diastolic blood pressure is 80 or higher.  Take medicines only as told by your health care provider. Follow the directions carefully. Blood pressure medicines must be taken as prescribed. The medicine does not work as well when you skip doses. Skipping doses also puts you at risk for problems. Contact a health care provider if:  You think you are having a reaction to medicines you have taken.  You have repeated (recurrent) headaches.  You feel dizzy.  You have swelling in your ankles.  You have trouble with your vision. Get help right away if:  You develop a severe headache or confusion.  You have unusual weakness or numbness, or you feel faint.  You have severe pain in your chest or abdomen.  You vomit repeatedly.  You have trouble breathing. Summary  Hypertension is when the force of blood pumping through  your arteries is too strong. If this condition is not controlled, it may put you at risk for serious complications.  Your personal target blood pressure may vary depending on your medical conditions, your age, and other factors. For most people, a normal blood pressure is less than 120/80.  Hypertension is managed by lifestyle changes, medicines, or both. Lifestyle changes include weight loss, eating a healthy, low-sodium diet, exercising more, and limiting alcohol. This information is not intended to replace advice given to you by your health care provider. Make sure you discuss any questions you have with your health care provider. Document Released: 04/10/2012 Document Revised: 06/14/2016 Document Reviewed: 06/14/2016 Elsevier Interactive Patient Education  2018 [place] [employer]. Pneumococcal Conjugate Vaccine suspension for injection What is this medicine? PNEUMOCOCCAL VACCINE (NEU mo KOK al vak SEEN) is a vaccine used to prevent pneumococcus bacterial infections. These bacteria can cause serious infections like pneumonia, meningitis, and blood infections. This vaccine will lower your chance of getting pneumonia. If you do get pneumonia, it can make your symptoms milder and your illness shorter. This vaccine will not treat an infection and will not cause infection. This vaccine is recommended for infants and young children, adults with certain medical conditions, and adults 46 years or older. This medicine may  be used for other purposes; ask your health care provider or pharmacist if you have questions. COMMON BRAND NAME(S): Prevnar, Prevnar 13 What should I tell my health care provider before I take this medicine? They need to know if you have any of these conditions: -bleeding problems -fever -immune system problems -an unusual or allergic reaction to pneumococcal vaccine, diphtheria toxoid, other vaccines, latex, other medicines, foods, dyes, or preservatives -pregnant or trying to get  pregnant -breast-feeding How should I use this medicine? This vaccine is for injection into a muscle. It is given by a health care professional. A copy of Vaccine Information Statements will be given before each vaccination. Read this sheet carefully each time. The sheet may change frequently. Talk to your pediatrician regarding the use of this medicine in children. While this drug may be prescribed for children as young as 76 weeks old for selected conditions, precautions do apply. Overdosage: If you think you have taken too much of this medicine contact a poison control center or emergency room at once. NOTE: This medicine is only for you. Do not share this medicine with others. What if I miss a dose? It is important not to miss your dose. Call your doctor or health care professional if you are unable to keep an appointment. What may interact with this medicine? -medicines for cancer chemotherapy -medicines that suppress your immune function -steroid medicines like prednisone or cortisone This list may not describe all possible interactions. Give your health care provider a list of all the medicines, herbs, non-prescription drugs, or dietary supplements you use. Also tell them if you smoke, drink alcohol, or use illegal drugs. Some items may interact with your medicine. What should I watch for while using this medicine? Mild fever and pain should go away in 3 days or less. Report any unusual symptoms to your doctor or health care professional. What side effects may I notice from receiving this medicine? Side effects that you should report to your doctor or health care professional as soon as possible: -allergic reactions like skin rash, itching or hives, swelling of the face, lips, or tongue -breathing problems -confused -fast or irregular heartbeat -fever over 102 degrees F -seizures -unusual bleeding or bruising -unusual muscle weakness Side effects that usually do not require medical  attention (report to your doctor or health care professional if they continue or are bothersome): -aches and pains -diarrhea -fever of 102 degrees F or less -headache -irritable -loss of appetite -pain, tender at site where injected -trouble sleeping This list may not describe all possible side effects. Call your doctor for medical advice about side effects. You may report side effects to FDA at 1-800-FDA-1088. Where should I keep my medicine? This does not apply. This vaccine is given in a clinic, pharmacy, doctor's office, or other health care setting and will not be stored at home. NOTE: This sheet is a summary. It may not cover all possible information. If you have questions about this medicine, talk to your doctor, pharmacist, or health care provider.  2018 Elsevier/Gold Standard (2014-04-23 10:27:27)

## 2017-01-23 ENCOUNTER — Other Ambulatory Visit (INDEPENDENT_AMBULATORY_CARE_PROVIDER_SITE_OTHER): Payer: Self-pay | Admitting: Physician Assistant

## 2017-01-24 LAB — BASIC METABOLIC PANEL
BUN/Creatinine Ratio: 19 (ref 12–28)
BUN: 22 mg/dL (ref 8–27)
CO2: 28 mmol/L (ref 20–29)
Calcium: 9.7 mg/dL (ref 8.7–10.3)
Chloride: 101 mmol/L (ref 96–106)
Creatinine, Ser: 1.14 mg/dL — ABNORMAL HIGH (ref 0.57–1.00)
GFR calc Af Amer: 51 mL/min/{1.73_m2} — ABNORMAL LOW (ref >59)
GFR, EST NON AFRICAN AMERICAN: 44 mL/min/{1.73_m2} — AB (ref >59)
GLUCOSE: 81 mg/dL (ref 65–99)
POTASSIUM: 4.7 mmol/L (ref 3.5–5.2)
SODIUM: 140 mmol/L (ref 134–144)

## 2017-01-26 ENCOUNTER — Other Ambulatory Visit (INDEPENDENT_AMBULATORY_CARE_PROVIDER_SITE_OTHER): Payer: Self-pay | Admitting: Physician Assistant

## 2017-01-26 DIAGNOSIS — R7989 Other specified abnormal findings of blood chemistry: Secondary | ICD-10-CM

## 2017-02-08 ENCOUNTER — Telehealth (INDEPENDENT_AMBULATORY_CARE_PROVIDER_SITE_OTHER): Payer: Self-pay | Admitting: Physician Assistant

## 2017-02-08 NOTE — Telephone Encounter (Signed)
FWD to PCP. Tempestt S Roberts, CMA  

## 2017-02-08 NOTE — Telephone Encounter (Signed)
Patient called stated does not remember why she needs to go to cardiologist.   Please follow up with patient at [phone]

## 2017-02-09 NOTE — Telephone Encounter (Signed)
Left message explaining to patient the purpose of her referral to Cardiology. Instructed patient to call the office with any additional questions. [name], CMA

## 2017-02-09 NOTE — Telephone Encounter (Signed)
She was referred to CHF clinic for Hypertension and lower extremity edema.

## 2017-02-14 ENCOUNTER — Telehealth: Payer: Self-pay | Admitting: Physician Assistant

## 2017-02-14 NOTE — Telephone Encounter (Signed)
-----   Message from Alonna Minium sent at 02/14/2017 11:24 AM EDT ----- Hey girl, this patient called she has a referral already to ENT but its scheduled around 8/7. She tried to call to see if they had anything sooner but they said no. She would like to referred elsewhere if possible if they have something sooner. States she is having a lot of discomfort   Thanks

## 2017-02-16 ENCOUNTER — Telehealth (INDEPENDENT_AMBULATORY_CARE_PROVIDER_SITE_OTHER): Payer: Self-pay | Admitting: Physician Assistant

## 2017-02-16 NOTE — Telephone Encounter (Signed)
Called to inform patient I called [facility] they have appt on or after 03/16/2017.  Patient has appt on 03/06/2017 with Dr. Benjamine Mola, Ent,  And she spoke with Armstead Peaks wanted to know if we could get a sooner appt for her.  Per patient she is still have pain. Stated can not go to ER because can't afford it.  But want to know how long will she have the ear pain. Stated Domenica Fail PA informed her she had a "hole in her ear"  Please follow up with patient.

## 2017-02-19 ENCOUNTER — Encounter (HOSPITAL_COMMUNITY): Payer: Self-pay | Admitting: Emergency Medicine

## 2017-02-19 ENCOUNTER — Emergency Department (HOSPITAL_COMMUNITY)
Admission: EM | Admit: 2017-02-19 | Discharge: 2017-02-19 | Disposition: A | Discharge status: Home / Self Care | Payer: Medicare HMO | Attending: Emergency Medicine | Admitting: Emergency Medicine

## 2017-02-19 DIAGNOSIS — H9201 Otalgia, right ear: Secondary | ICD-10-CM | POA: Diagnosis present

## 2017-02-19 DIAGNOSIS — I129 Hypertensive chronic kidney disease with stage 1 through stage 4 chronic kidney disease, or unspecified chronic kidney disease: Secondary | ICD-10-CM | POA: Insufficient documentation

## 2017-02-19 DIAGNOSIS — N183 Chronic kidney disease, stage 3 (moderate): Secondary | ICD-10-CM | POA: Diagnosis not present

## 2017-02-19 DIAGNOSIS — H7291 Unspecified perforation of tympanic membrane, right ear: Secondary | ICD-10-CM | POA: Diagnosis not present

## 2017-02-19 DIAGNOSIS — Z7982 Long term (current) use of aspirin: Secondary | ICD-10-CM | POA: Diagnosis not present

## 2017-02-19 MED ORDER — ACETAMINOPHEN 325 MG PO TABS
650.0000 mg | ORAL_TABLET | Freq: Once | ORAL | Status: AC
  Administered 2017-02-19: 650 mg via ORAL
  Filled 2017-02-19: qty 2

## 2017-02-19 NOTE — ED Notes (Signed)
Provider at bedside

## 2017-02-19 NOTE — ED Triage Notes (Signed)
Per pt, states right ear pain causing right neck discomfort for over a month-has seen PCP and was told she has wax build up-states meds have not cleared up dicomfort

## 2017-02-19 NOTE — Telephone Encounter (Signed)
FWD to PCP. Tempestt S Roberts, CMA  

## 2017-02-19 NOTE — Telephone Encounter (Signed)
Patient informed. [name], CMA  

## 2017-02-19 NOTE — ED Provider Notes (Signed)
[facility] DEPT Provider Note   CSN: [id number] Arrival date & time: 02/19/17  1002     History   Chief Complaint Chief Complaint  Patient presents with  . Otalgia  . Neck Pain    HPI [name] is a 83 y.o. female with past medical history of multiple comorbidities presenting today for right ear pain and neck pain 1 month. The patient was initially seen for this on 01/01/2017 by her PCP where she was diagnosed with dislocation of the TMJ joint and cerumen impaction. She started Debrox drops in the right ear. She was seen back for this on 01/22/2017 by her PCP where she states she had stopped using the Debrox drops due to right ear pain. The patient cerumen impaction and TMJ dislocation had resolved, however she was found to have a right TM perforation. The patient was given precautions, told not to use any more ear drops of any kind and to follow up with ENT. The patient is presenting today because the patient is not able to be seen by ENT until 03/06/17 and states she cannot stand the pain. Pain is intermittent, lasting a few seconds, and is located on the right ear with mild radiation into the right neck. Describes the pain as throbbing. She gets relief from 8/10 pain to 4/10 pain with 500mg  Tylenol BID. She denies current use of ear drops of submerging head underwater. The patient denies complete loss of audition, vertigo, or headache. No difficulty with ambulation (uses cane at baseline). She does not endorse any other symptoms or complaints.   HPI  Past Medical History:  Diagnosis Date  . CAP (community acquired pneumonia) 09/21/2015  . Chronic kidney disease, stage III (moderate)   . Encounter for long-term (current) use of other medications   . Glaucoma (increased eye pressure)   . Head pain   . Hypertension   . Impaired glucose tolerance test   . Insomnia, unspecified   . Morbid obesity ([place])   . Neck pain   . Osteoarthrosis, unspecified whether generalized or localized,  other specified sites   . Other lymphedema   . Pain in joint, multiple sites   . Primary open-angle glaucoma(365.11)     Patient Active Problem List   Diagnosis Date Noted  . Cerumen impaction 12/04/2016  . Generalized weakness 09/22/2015  . Acidosis 09/22/2015  . Acute encephalopathy 09/22/2015  . Hyperglycemia 09/22/2015  . Leukopenia 09/22/2015  . CAP (community acquired pneumonia) 09/22/2015  . Bilateral leg edema 09/22/2015  . Constipation 09/22/2015  . Snoring 09/13/2015  . Glaucoma (increased eye pressure)   . Hypertension   . Neck pain   . Head pain   . Pain in joint, multiple sites   . Impaired glucose tolerance test   . Osteoarthrosis, unspecified whether generalized or localized, other specified sites   . Morbid obesity ([place])   . Encounter for long-term (current) use of other medications   . Other lymphedema   . Insomnia, unspecified   . Chronic kidney disease, stage III (moderate)   . Primary open-angle glaucoma(365.11)     Past Surgical History:  Procedure Laterality Date  . ABDOMINAL HYSTERECTOMY    . EYE SURGERY      OB History    No data available       Home Medications    Prior to Admission medications   Medication Sig Start Date End Date Taking? Authorizing Provider  ACAI PO Take 1 capsule by mouth 2 (two) times daily.  [provider]  amLODipine (NORVASC) 2.5 MG tablet Take 1 tablet (2.5 mg total) by mouth daily. 11/24/16   Clent Demark, PA-C  aspirin EC 81 MG tablet Take 81 mg by mouth daily.    [provider]  brimonidine (ALPHAGAN) 0.15 % ophthalmic solution Place 1 drop into the left eye 2 (two) times daily.  10/29/12   [provider]  carbamide peroxide (DEBROX) 6.5 % otic solution Place 5 drops into both ears 2 (two) times daily. 01/01/17   Clent Demark, PA-C  clindamycin (CLINDAGEL) 1 % gel Apply topically 2 (two) times daily. 12/04/16   Clent Demark, PA-C  Cyanocobalamin (VITAMIN B-12 PO) Take  1 tablet by mouth daily.    [provider]  cyclobenzaprine (FLEXERIL) 5 MG tablet Take 1 tablet (5 mg total) by mouth at bedtime. 01/01/17   Clent Demark, PA-C  GARLIC PO Take 1,443 mg by mouth daily.     [provider]  ibuprofen (ADVIL,MOTRIN) 800 MG tablet Take 800 mg by mouth every 8 (eight) hours as needed for fever, headache, mild pain, moderate pain or cramping.    [provider]  losartan-hydrochlorothiazide (HYZAAR) 100-25 MG tablet Take 1 tablet by mouth daily. 10/02/16   [provider]  Multiple Vitamin (MULTIVITAMIN WITH MINERALS) TABS Take 1 tablet by mouth daily.    [provider]  omeprazole (PRILOSEC) 20 MG capsule Take 1 capsule (20 mg total) by mouth daily. 11/24/16   Clent Demark, PA-C  Polyethyl Glycol-Propyl Glycol 0.4-0.3 % SOLN Place 1 drop into both eyes 3 (three) times daily as needed (for lubricating eyes).    [provider]  prednisoLONE acetate (PRED FORTE) 1 % ophthalmic suspension Place 1 drop into the left eye daily.  10/29/12   [provider]  spironolactone (ALDACTONE) 25 MG tablet Take 1 tablet (25 mg total) by mouth daily. 01/01/17   Clent Demark, PA-C    Family History Family History  Problem Relation Age of Onset  . Cancer Father     Social History Social History  Substance Use Topics  . Smoking status: Never Smoker  . Smokeless tobacco: Never Used  . Alcohol use No     Allergies   Penicillins and Tetanus toxoid   Review of Systems Review of Systems  All other systems reviewed and are negative.    Physical Exam Updated Vital Signs BP (!) 147/65 (BP Location: Left Arm)   Pulse 62   Temp 98.2 F (36.8 C) (Oral)   Resp 18   Ht 4\' 10"  (1.473 m)   Wt 64.4 kg (142 lb)   SpO2 100%   BMI 29.68 kg/m   Physical Exam  Constitutional: She appears well-developed and well-nourished. No distress.  Elderly female, Non-toxic appearing.    HENT:  Head:  Normocephalic and atraumatic.  Right Ear: External ear normal. No drainage, swelling or tenderness. No mastoid tenderness. Tympanic membrane is perforated.  Left Ear: Hearing, tympanic membrane, external ear and ear canal normal.  Nose: Nose normal. Right sinus exhibits no maxillary sinus tenderness and no frontal sinus tenderness. Left sinus exhibits no maxillary sinus tenderness and no frontal sinus tenderness.  Mouth/Throat: Oropharynx is clear and moist and mucous membranes are normal. No trismus in the jaw. No tonsillar exudate.  Patient without loss of hearing b/l. Large amount of cerumen in the right ear canal.  Patient with absent uvula. Poor dentition. No TMJ tenderness.  Eyes: Conjunctivae, EOM and lids are  normal. Right eye exhibits no discharge. Left eye exhibits no discharge. No scleral icterus. Right eye exhibits normal extraocular motion and no nystagmus. Left eye exhibits normal extraocular motion and no nystagmus.  Neck: Normal range of motion. Neck supple. Muscular tenderness present. No spinous process tenderness present. Carotid bruit is not present. No neck rigidity. Normal range of motion present.    Pulmonary/Chest: Effort normal. No respiratory distress.  Neurological: She is alert.  Skin: Skin is warm and dry. No rash noted. She is not diaphoretic. No pallor.  Psychiatric: She has a normal mood and affect.  Nursing note and vitals reviewed.    ED Treatments / Results  Labs (all labs ordered are listed, but only abnormal results are displayed) Labs Reviewed - No data to display  EKG  EKG Interpretation None       Radiology No results found.  Procedures Procedures (including critical care time)  Medications Ordered in ED Medications  acetaminophen (TYLENOL) tablet 650 mg (650 mg Oral Given 02/19/17 1501)     Initial Impression / Assessment and Plan / ED Course  I have reviewed the triage vital signs and the nursing notes.  Pertinent labs & imaging  results that were available during my care of the patient were reviewed by me and considered in my medical decision making (see chart for details).     83 year old female presenting today with right ear pain after being diagnosed with right TM perforation by PCP. The patient was originally seen for TMJ dislocation and cerumen impaction on 01/01/17. On follow up patient was found to have resolution of TMJ dislocation and cerumen impaction however was found to have right TM perforation. Referal to ENT on 03/06/17 was made however patient did not wish to wait this long as she has been having ongoing pain. No red flag symptoms or change since last seen. Will refer patient to ENT for follow up this week and have patient take Tylenol for pain. I advised the patient to return to the emergency department with new or worsening symptoms or new concerns. Specific return precautions discussed. The patient verbalized understanding and agreement with plan. All questions answered. No further questions at this time. Patient appears safe for discharge.  Patient case discussed and seen with Dr. Ashok Cordia who is in agreement with plan.   Final Clinical Impressions(s) / ED Diagnoses   Final diagnoses:  Perforation of right tympanic membrane    New Prescriptions New Prescriptions   No medications on file     Lorelle Gibbs 02/19/17 2355    Lajean Saver, MD 02/20/17 [phone]

## 2017-02-19 NOTE — Telephone Encounter (Signed)
Pt has to receive treatment from ENT. For now, she should avoid introducing anything into the ear, eg. Ear drops. She may deal directly with ENT office's scheduling department to try and get a sooner appointment.

## 2017-02-19 NOTE — Discharge Instructions (Signed)
Please call and follow up with ENT.   An eardrum rupture is a hole (perforation) in the eardrum. The eardrum is a thin, round tissue inside of the ear that separates the ear canal from the middle ear. The eardrum is also called the tympanic membrane. It transfers sound vibrations through small bones in the middle ear to the hearing nerve in the inner ear. It also protects the middle ear from germs. An eardrum rupture can cause pain and hearing loss.  How is this diagnosed?  This condition is diagnosed based on your symptoms and medical history as well as a physical exam. Your health care provider can usually see a perforation using an ear scope (otoscope).   How is this treated? An eardrum typically heals on its own within a few weeks. If your eardrum does not heal, your health care provider may recommend a procedure to place a patch over your eardrum or surgery to repair your eardrum. Your health care provider may also prescribe antibiotic medicines to help prevent infection. If the ear heals completely, any hearing loss should be temporary. Follow these instructions at home:  Keep your ear dry. This is very important. Follow instructions from your health care provider about how to keep your ear dry. You may need to wear waterproof earplugs when bathing and swimming.  Do not emerge your head underwater.   Do not stick anything smaller then your elbow in your ear.   Take over-the-counter and prescription medicines only as told by your health care provider.  Return to sports and activities as told by your health care provider. Ask your health care provider what activities are safe for you.  Wear headgear with ear protection when you play sports in which ear injuries are common.  If directed, apply heat to your affected ear as often as told by your health care provider. Use the heat source that your health care provider recommends, such as a moist heat pack or a heating pad. This will help to  relieve pain. ? Place a towel between your skin and the heat source. ? Leave the heat on for 20-30 minutes. ? Remove the heat if your skin turns bright red. This is especially important if you are unable to feel pain, heat, or cold. You may have a greater risk of getting burned.  Keep all follow-up visits as told by your health care provider. This is important.  Talk to your health care provider before traveling by plane. Contact a health care provider if:  You have mucus or blood draining from your ear.  You have a fever.  You have ear pain.  You have hearing loss, dizziness, or ringing in your ear. Get help right away if:  You have sudden hearing loss.  You are very dizzy.  You have severe ear pain.  Your face feels weak or becomes paralyzed. Summary  An eardrum rupture is a hole (perforation) in the eardrum that can cause pain and hearing loss. It is usually caused by a sudden injury to the ear.  The eardrum will likely heal on its own within a few weeks. In some cases, surgery may be necessary.  After the injury, follow instructions from your health care provider about how to keep your ear dry as it heals.

## 2017-03-14 ENCOUNTER — Ambulatory Visit (INDEPENDENT_AMBULATORY_CARE_PROVIDER_SITE_OTHER): Payer: Medicare HMO

## 2017-03-14 ENCOUNTER — Encounter (INDEPENDENT_AMBULATORY_CARE_PROVIDER_SITE_OTHER): Payer: Self-pay | Admitting: Orthopaedic Surgery

## 2017-03-14 ENCOUNTER — Ambulatory Visit (INDEPENDENT_AMBULATORY_CARE_PROVIDER_SITE_OTHER): Payer: Medicare HMO | Admitting: Orthopaedic Surgery

## 2017-03-14 VITALS — BP 137/68 | HR 63 | Ht <= 58 in | Wt 142.0 lb

## 2017-03-14 DIAGNOSIS — M4722 Other spondylosis with radiculopathy, cervical region: Secondary | ICD-10-CM | POA: Diagnosis not present

## 2017-03-14 DIAGNOSIS — M542 Cervicalgia: Secondary | ICD-10-CM

## 2017-03-14 NOTE — Progress Notes (Addendum)
Office Visit Note   Patient: [name]           Date of Birth: 05/02/1930           MRN: [id number] Visit Date: 03/14/2017              Requested by: Clent Demark, PA-C [place], [place] [address] PCP: Clent Demark, PA-C   Assessment & Plan: Visit Diagnoses:  1. Neck pain   2. Other spondylosis with radiculopathy, cervical region     Plan: We'll set her up for some physical therapy check her back again in 4 weeks. If she does not respond to treatment we can consider diagnostic imaging.  Follow-Up Instructions: No Follow-up on file.   Orders:  Orders Placed This Encounter  Procedures  . XR Cervical Spine 2 or 3 views   No orders of the defined types were placed in this encounter.     Procedures: No procedures performed   Clinical Data: No additional findings.   Subjective: Chief Complaint  Patient presents with  . Neck - Pain    HPI 83 year old female seen with several months of neck pain worse on the right with right hand numbness. She has some visual impairment which chills a magnifying glass to read she has numbness and tingling in her hand. Pain does not wake her up at night she states involves the whole hand. Numbness with writing also noted. She's had some ear pain on the right and was using some D Brox drops and was found to have a perforation in her eardrum. She is use some Tylenol occasionally for discomfort. She has had some impaired glucose tolerance with CBGs in the low 100s. She is not on any medication for diabetes.  Review of Systems review of systems positive for stage III kidney disease moderate, hypertension, chronic neck pain, impaired glucose tolerance, glaucoma, perforated eardrum on the right. Chronic neck pain and right shoulder pain with right hand numbness. The rest review systems are negative.Previous left carpal tunnel release many years ago.   Objective: Vital Signs: BP 137/68   Pulse 63   Ht 4\' 10"   (1.473 m)   Wt 142 lb (64.4 kg)   BMI 29.68 kg/m   Physical Exam  Constitutional: She is oriented to person, place, and time. She appears well-developed.  HENT:  Head: Normocephalic.  Right Ear: External ear normal.  Left Ear: External ear normal.  Eyes:  Patient has minimal low vision left eye and had previous corneal transplant she has a little bit better vision in her right eye.  Neck: No tracheal deviation present. No thyromegaly present.  Cardiovascular: Normal rate.   Pulmonary/Chest: Effort normal.  Abdominal: Soft.  Musculoskeletal:  Patient has flexion of her neck and chin within 4 fingerbreadths she has pain with extension pain with rotation of the right limited to 30% of normal. Brachial plexus tenderness on the right. Upper extremity reflexes are 1+ biceps triceps brachioradialis no lower extremity hyperreflexia. Biceps triceps was flexion-extension are normal. She has the mild thenar atrophy on the right no hyperthenar atrophy. Needle carpal compression test. No interosseous atrophy. Hyperpigmented area left long finger over the proximal phalanx palmar surface which she states has been there for many many years.  Neurological: She is alert and oriented to person, place, and time.  Skin: Skin is warm and dry.  Psychiatric: She has a normal mood and affect. Her behavior is normal.    Ortho Exam  Specialty  Comments:  No specialty comments available.  Imaging: Xr Cervical Spine 2 Or 3 Views  Result Date: 03/14/2017 AP lateral C-spine x-rays obtained. This shows increased cervical kyphosis without anterolisthesis. She has disc space narrowing from C3-C7 with mild endplate spurring. Impression: Cervical spondylosis without anterolisthesis.    PMFS History: Patient Active Problem List   Diagnosis Date Noted  . Other spondylosis with radiculopathy, cervical region 03/14/2017  . Cerumen impaction 12/04/2016  . Generalized weakness 09/22/2015  . Acidosis 09/22/2015  .  Acute encephalopathy 09/22/2015  . Hyperglycemia 09/22/2015  . Leukopenia 09/22/2015  . CAP (community acquired pneumonia) 09/22/2015  . Bilateral leg edema 09/22/2015  . Constipation 09/22/2015  . Snoring 09/13/2015  . Glaucoma (increased eye pressure)   . Hypertension   . Neck pain   . Head pain   . Pain in joint, multiple sites   . Impaired glucose tolerance test   . Osteoarthrosis, unspecified whether generalized or localized, other specified sites   . Morbid obesity ([place])   . Encounter for long-term (current) use of other medications   . Other lymphedema   . Insomnia, unspecified   . Chronic kidney disease, stage III (moderate)   . Primary open-angle glaucoma(365.11)    Past Medical History:  Diagnosis Date  . CAP (community acquired pneumonia) 09/21/2015  . Chronic kidney disease, stage III (moderate)   . Encounter for long-term (current) use of other medications   . Glaucoma (increased eye pressure)   . Head pain   . Hypertension   . Impaired glucose tolerance test   . Insomnia, unspecified   . Morbid obesity ([place])   . Neck pain   . Osteoarthrosis, unspecified whether generalized or localized, other specified sites   . Other lymphedema   . Pain in joint, multiple sites   . Primary open-angle glaucoma(365.11)     Family History  Problem Relation Age of Onset  . Cancer Father     Past Surgical History:  Procedure Laterality Date  . ABDOMINAL HYSTERECTOMY    . EYE SURGERY     Social History   Occupational History  . retired    Social History Main Topics  . Smoking status: Never Smoker  . Smokeless tobacco: Never Used  . Alcohol use No  . Drug use: No  . Sexual activity: Not on file

## 2017-03-14 NOTE — Addendum Note (Signed)
Addended by: Meyer Cory on: 03/14/2017 12:17 PM   Modules accepted: Orders

## 2017-03-19 ENCOUNTER — Ambulatory Visit (INDEPENDENT_AMBULATORY_CARE_PROVIDER_SITE_OTHER): Payer: Medicare HMO | Admitting: Internal Medicine

## 2017-03-19 ENCOUNTER — Encounter: Payer: Self-pay | Admitting: Internal Medicine

## 2017-03-19 VITALS — BP 142/64 | HR 51 | Ht <= 58 in | Wt 140.0 lb

## 2017-03-19 DIAGNOSIS — I10 Essential (primary) hypertension: Secondary | ICD-10-CM

## 2017-03-19 DIAGNOSIS — R600 Localized edema: Secondary | ICD-10-CM

## 2017-03-19 DIAGNOSIS — I340 Nonrheumatic mitral (valve) insufficiency: Secondary | ICD-10-CM | POA: Diagnosis not present

## 2017-03-19 NOTE — Addendum Note (Signed)
Addended by: Pixie Casino on: 03/19/2017 10:37 AM   Modules accepted: Level of Service

## 2017-03-19 NOTE — Progress Notes (Signed)
OFFICE CONSULT NOTE  Chief Complaint:  Leg edema, hypertension  Primary Care Physician: Clent Demark, PA-C  HPI:  [name] is a 83 y.o. female who is being seen today for the evaluation of leg edema and hypertension at the request of Clent Demark, PA-C. [name] is a pleasant patient who is referred by Mr. Altamease Oiler for evaluation of hypertension and lower extremity edema. She said recently her blood pressures been difficult to control but that he added spironolactone and since then her blood pressures been much better with improvement of her lower extremity swelling. In fact her leg swelling is back to her baseline and she can now fitting her shoes. She denies shortness of breath or chest pain. It was noted that she has a history of murmur and has had both mitral and tricuspid insufficiency. This is based on the hospital echo last year. When I inquired further about this she says "oh yes, my cardiologist Dr. Terrence Dupont follows this closely." I asked her how regularly she sees him and she said she saw him just the other week and typically goes every 3 months. I then asked if she was sent to see me for another opinion or if her PCP was aware that she had a cardiologist and she said "I think he is aware".  PMHx:  Past Medical History:  Diagnosis Date  . CAP (community acquired pneumonia) 09/21/2015  . Chronic kidney disease, stage III (moderate)   . Encounter for long-term (current) use of other medications   . Glaucoma (increased eye pressure)   . Head pain   . Hypertension   . Impaired glucose tolerance test   . Insomnia, unspecified   . Morbid obesity ([place])   . Neck pain   . Osteoarthrosis, unspecified whether generalized or localized, other specified sites   . Other lymphedema   . Pain in joint, multiple sites   . Primary open-angle glaucoma(365.11)     Past Surgical History:  Procedure Laterality Date  . ABDOMINAL HYSTERECTOMY    . EYE SURGERY      FAMHx:    Family History  Problem Relation Age of Onset  . Cancer Father     SOCHx:   reports that she has never smoked. She has never used smokeless tobacco. She reports that she does not drink alcohol or use drugs.  ALLERGIES:  Allergies  Allergen Reactions  . Penicillins Hives    Has patient had a PCN reaction causing immediate rash, facial/tongue/throat swelling, SOB or lightheadedness with hypotension: No Has patient had a PCN reaction causing severe rash involving mucus membranes or skin necrosis: No Has patient had a PCN reaction that required hospitalization No Has patient had a PCN reaction occurring within the last 10 years: No If all of the above answers are "NO", then may proceed with Cephalosporin use.  . Tetanus Toxoid Hives    ROS: Pertinent items noted in HPI and remainder of comprehensive ROS otherwise negative.  HOME MEDS: Current Outpatient Prescriptions on File Prior to Visit  Medication Sig Dispense Refill  . ACAI PO Take 1 capsule by mouth 2 (two) times daily.    Marland Kitchen amLODipine (NORVASC) 2.5 MG tablet Take 1 tablet (2.5 mg total) by mouth daily. 90 tablet 1  . aspirin EC 81 MG tablet Take 81 mg by mouth daily.    . brimonidine (ALPHAGAN) 0.15 % ophthalmic solution Place 1 drop into the left eye 2 (two) times daily.     . Cyanocobalamin (VITAMIN  B-12 PO) Take 1 tablet by mouth daily.    Marland Kitchen GARLIC PO Take 3,614 mg by mouth daily.     Marland Kitchen losartan-hydrochlorothiazide (HYZAAR) 100-25 MG tablet Take 1 tablet by mouth daily.    . Multiple Vitamin (MULTIVITAMIN WITH MINERALS) TABS Take 1 tablet by mouth daily.    Vladimir Faster Glycol-Propyl Glycol 0.4-0.3 % SOLN Place 1 drop into both eyes 3 (three) times daily as needed (for lubricating eyes).    . prednisoLONE acetate (PRED FORTE) 1 % ophthalmic suspension Place 1 drop into the left eye daily.     Marland Kitchen spironolactone (ALDACTONE) 25 MG tablet Take 1 tablet (25 mg total) by mouth daily. 90 tablet 3   No current  facility-administered medications on file prior to visit.     LABS/IMAGING: No results found for this or any previous visit (from the past 48 hour(s)). No results found.  LIPID PANEL: No results found for: CHOL, TRIG, HDL, CHOLHDL, VLDL, LDLCALC, LDLDIRECT  WEIGHTS: Wt Readings from Last 3 Encounters:  03/19/17 140 lb (63.5 kg)  03/14/17 142 lb (64.4 kg)  02/19/17 142 lb (64.4 kg)    VITALS: BP (!) 142/64   Pulse (!) 51   Ht 4\' 10"  (1.473 m)   Wt 140 lb (63.5 kg)   BMI 29.26 kg/m   EXAM: General appearance: alert and no distress Neck: no carotid bruit, no JVD and thyroid not enlarged, symmetric, no tenderness/mass/nodules Lungs: clear to auscultation bilaterally Heart: regular rate and rhythm and systolic murmur: early systolic 2/6, blowing at 2nd right intercostal space, at apex Abdomen: soft, non-tender; bowel sounds normal; no masses,  no organomegaly Extremities: edema Trace Pulses: 2+ and symmetric Skin: Skin color, texture, turgor normal. No rashes or lesions Neurologic: Grossly normal Psych: Pleasant  EKG: Sinus bradycardia with sinus arrhythmia at 51- personally reviewed  ASSESSMENT: 1. Chronic lower extremity edema-improved on Aldactone 2. Hypertension-also improved with Aldactone 3. Valvular heart disease  PLAN: 1.   [name] has had significant improvement of her edema on Aldactone as well as her blood pressure. She is on low-dose amlodipine which could be contributing to her swelling, but she says she's been on that for years. Her increased swelling could be related to worsening valvular heart disease. She does have both mitral and tricuspid valve murmurs. This is apparently known and being followed by her usual cardiologist Dr. Terrence Dupont. He apparently also got another echo recently. I encouraged her to follow-up with him as scheduled.  Thanks for the kind referral.  Pixie Casino, MD, [place]  Attending Cardiologist   Direct Dial: [phone]  Fax: [phone]  Website:  [email or web address] 03/19/2017, 10:29 AM

## 2017-03-19 NOTE — Patient Instructions (Signed)
Your physician recommends that you schedule a follow-up appointment as needed with Dr. Hilty.  

## 2017-03-26 ENCOUNTER — Ambulatory Visit (INDEPENDENT_AMBULATORY_CARE_PROVIDER_SITE_OTHER): Payer: Medicare HMO | Admitting: Physician Assistant

## 2017-03-26 ENCOUNTER — Encounter (INDEPENDENT_AMBULATORY_CARE_PROVIDER_SITE_OTHER): Payer: Self-pay | Admitting: Physician Assistant

## 2017-03-26 VITALS — BP 132/71 | HR 54 | Temp 97.8°F | Wt 142.4 lb

## 2017-03-26 DIAGNOSIS — L84 Corns and callosities: Secondary | ICD-10-CM

## 2017-03-26 DIAGNOSIS — Z79899 Other long term (current) drug therapy: Secondary | ICD-10-CM

## 2017-03-26 DIAGNOSIS — Z131 Encounter for screening for diabetes mellitus: Secondary | ICD-10-CM

## 2017-03-26 DIAGNOSIS — I10 Essential (primary) hypertension: Secondary | ICD-10-CM | POA: Diagnosis not present

## 2017-03-26 DIAGNOSIS — K5900 Constipation, unspecified: Secondary | ICD-10-CM | POA: Diagnosis not present

## 2017-03-26 DIAGNOSIS — R102 Pelvic and perineal pain: Secondary | ICD-10-CM

## 2017-03-26 LAB — POCT GLYCOSYLATED HEMOGLOBIN (HGB A1C): HEMOGLOBIN A1C: 5.9

## 2017-03-26 MED ORDER — LOSARTAN POTASSIUM-HCTZ 100-25 MG PO TABS: 1.0000 | ORAL_TABLET | Freq: Every day | ORAL | 3 refills | Status: DC

## 2017-03-26 MED ORDER — SPIRONOLACTONE 25 MG PO TABS: 25.0000 mg | ORAL_TABLET | Freq: Every day | ORAL | 3 refills | Status: DC

## 2017-03-26 MED ORDER — ASPIRIN EC 81 MG PO TBEC: 81.0000 mg | DELAYED_RELEASE_TABLET | Freq: Every day | ORAL | 3 refills | Status: DC

## 2017-03-26 MED ORDER — DOCUSATE SODIUM 50 MG PO CAPS: 50.0000 mg | ORAL_CAPSULE | Freq: Two times a day (BID) | ORAL | 0 refills | Status: DC

## 2017-03-26 MED ORDER — AMLODIPINE BESYLATE 2.5 MG PO TABS: 2.5000 mg | ORAL_TABLET | Freq: Every day | ORAL | 3 refills | Status: DC

## 2017-03-26 NOTE — Progress Notes (Signed)
Subjective:  Patient ID: [name], female    DOB: August 05, 1929  Age: 83 y.o. MRN: [id number]  CC: f/u HTN   HPI [name] is a 83 y.o. female with multiple comorbidities presents on f/u on HTN. Presents her BP log which reveals very good control of her BP. Has been doing much better since the addition of spironolactone. She is followed by Dr. Terrence Dupont, whose notes are not available. However, she did see another cardiologist with [facility] recently and was advised to continue same treatment and to f/u with Dr. Terrence Dupont. Patient feels well. Denies CP, palpitations, SOB, HA, abdominal pain, presyncope, syncope, orthopnea, or PND.    Has a right sided pelvic pain that is exacerbated with bending forward. She had a [place] on 07/03/14 which showed decreasing right adnexal cystic lesion. Had recommendation to have yearly US done but has not done so. Pain is not present unless she bend forward and "compresses" the pelvic area. Has constipation at times but does not know if related to right pelvic pain.    * pt requested a podiatry referral after discharge. Referrral to have treatment for calluses on both feet.  Outpatient Medications Prior to Visit  Medication Sig Dispense Refill  . ACAI PO Take 1 capsule by mouth 2 (two) times daily.    Marland Kitchen amLODipine (NORVASC) 2.5 MG tablet Take 1 tablet (2.5 mg total) by mouth daily. 90 tablet 1  . aspirin EC 81 MG tablet Take 81 mg by mouth daily.    . brimonidine (ALPHAGAN) 0.15 % ophthalmic solution Place 1 drop into the left eye 2 (two) times daily.     . Cyanocobalamin (VITAMIN B-12 PO) Take 1 tablet by mouth daily.    Marland Kitchen GARLIC PO Take 1,245 mg by mouth daily.     Marland Kitchen losartan-hydrochlorothiazide (HYZAAR) 100-25 MG tablet Take 1 tablet by mouth daily.    . Multiple Vitamin (MULTIVITAMIN WITH MINERALS) TABS Take 1 tablet by mouth daily.    Vladimir Faster Glycol-Propyl Glycol 0.4-0.3 % SOLN Place 1 drop into both eyes 3 (three) times daily as needed (for  lubricating eyes).    . prednisoLONE acetate (PRED FORTE) 1 % ophthalmic suspension Place 1 drop into the left eye daily.     Marland Kitchen spironolactone (ALDACTONE) 25 MG tablet Take 1 tablet (25 mg total) by mouth daily. 90 tablet 3   No facility-administered medications prior to visit.      ROS Review of Systems  Constitutional: Negative for chills, fever and malaise/fatigue.  Eyes: Negative for blurred vision.  Respiratory: Negative for shortness of breath.   Cardiovascular: Negative for chest pain and palpitations.  Gastrointestinal: Positive for abdominal pain (right adnexal pain) and constipation. Negative for nausea.  Genitourinary: Negative for dysuria and hematuria.  Musculoskeletal: Negative for joint pain and myalgias.  Skin: Negative for rash.       Calluses on soles of feet  Neurological: Negative for tingling and headaches.  Psychiatric/Behavioral: Negative for depression. The patient is not nervous/anxious.     Objective:  BP (!) 152/78 (BP Location: Left Arm, Patient Position: Sitting, Cuff Size: Normal)   Pulse (!) 54   Temp 97.8 F (36.6 C) (Oral)   Wt 142 lb 6.4 oz (64.6 kg)   SpO2 100%   BMI 29.76 kg/m   BP/Weight 03/26/2017 03/19/2017 03/08/9832  Systolic BP 825 053 976  Diastolic BP 78 64 68  Wt. (Lbs) 142.4 140 142  BMI 29.76 29.26 29.68  Physical Exam  Constitutional: She is oriented to person, place, and time.  Elderly frail, NAD, polite  HENT:  Head: Normocephalic and atraumatic.  Poor dentition  Eyes: No scleral icterus.  Neck: Normal range of motion. Neck supple. No thyromegaly present.  Cardiovascular: Regular rhythm.   Murmur heard. bradycardic  Pulmonary/Chest: Effort normal and breath sounds normal.  Abdominal: Soft. Bowel sounds are normal. There is no tenderness.  Musculoskeletal: She exhibits no edema.  Bilateral LE edema drastically reduced since previous visit. Wearing compression stockings.  Neurological: She is alert and oriented  to person, place, and time. No cranial nerve deficit.  Skin: Skin is warm and dry. No rash noted. No erythema. No pallor.  Psychiatric: She has a normal mood and affect. Her behavior is normal. Thought content normal.  Vitals reviewed.    Assessment & Plan:      1. Hypertension, unspecified type - Refill amLODipine (NORVASC) 2.5 MG tablet; Take 1 tablet (2.5 mg total) by mouth daily.  Dispense: 90 tablet; Refill: 3 - Refill losartan-hydrochlorothiazide (HYZAAR) 100-25 MG tablet; Take 1 tablet by mouth daily.  Dispense: 90 tablet; Refill: 3 - Refill spironolactone (ALDACTONE) 25 MG tablet; Take 1 tablet (25 mg total) by mouth daily.  Dispense: 90 tablet; Refill: 3 - Refill aspirin EC 81 MG tablet; Take 1 tablet (81 mg total) by mouth daily.  Dispense: 90 tablet; Refill: 3 - Comprehensive metabolic panel  2. Screening for diabetes mellitus - HgB A1c 5.9%  3. Constipation, unspecified constipation type - Begin docusate sodium (COLACE) 50 MG capsule; Take 1 capsule (50 mg total) by mouth 2 (two) times daily.  Dispense: 10 capsule; Refill: 0  4. High risk medication use - Comprehensive metabolic panel. Check electrolytes and kidney/liver function due to antihypertensives.  5. Adnexal tenderness, right - US Pelvis Complete; Future - US Transvaginal Non-OB; Future - Patient previously advised to have yearly [place] due to previous finding of cystic lesion in right adnexa.  6. Callus of foot - Podiatry referral   Meds ordered this encounter  Medications  . amLODipine (NORVASC) 2.5 MG tablet    Sig: Take 1 tablet (2.5 mg total) by mouth daily.    Dispense:  90 tablet    Refill:  3    Order Specific Question:   Supervising Provider    Answer:   Tresa Garter [id number]  . losartan-hydrochlorothiazide (HYZAAR) 100-25 MG tablet    Sig: Take 1 tablet by mouth daily.    Dispense:  90 tablet    Refill:  3    Order Specific Question:   Supervising Provider    Answer:   Tresa Garter [id number]  . spironolactone (ALDACTONE) 25 MG tablet    Sig: Take 1 tablet (25 mg total) by mouth daily.    Dispense:  90 tablet    Refill:  3    Order Specific Question:   Supervising Provider    Answer:   Tresa Garter [id number]  . aspirin EC 81 MG tablet    Sig: Take 1 tablet (81 mg total) by mouth daily.    Dispense:  90 tablet    Refill:  3    Order Specific Question:   Supervising Provider    Answer:   Tresa Garter [id number]  . docusate sodium (COLACE) 50 MG capsule    Sig: Take 1 capsule (50 mg total) by mouth 2 (two) times daily.    Dispense:  10 capsule    Refill:  0  Order Specific Question:   Supervising Provider    Answer:   Tresa Garter [[id number]]    Follow-up: Return in about 4 months (around 07/26/2017) for htn.   Clent Demark PA

## 2017-03-26 NOTE — Patient Instructions (Signed)
Spironolactone tablets What is this medicine? SPIRONOLACTONE ([place] LAK tone) is a diuretic. It helps you make more urine and to lose excess water from your body. This medicine is used to treat high blood pressure, and edema or swelling from heart, kidney, or liver disease. It is also used to treat patients who make too much aldosterone or have low potassium. This medicine may be used for other purposes; ask your health care provider or pharmacist if you have questions. COMMON BRAND NAME(S): Aldactone What should I tell my health care provider before I take this medicine? They need to know if you have any of these conditions: -high blood level of potassium -kidney disease or trouble making urine -liver disease -an unusual or allergic reaction to spironolactone, other medicines, foods, dyes, or preservatives -pregnant or trying to get pregnant -breast-feeding How should I use this medicine? Take this medicine by mouth with a drink of water. Follow the directions on your prescription label. You can take it with or without food. If it upsets your stomach, take it with food. Do not take your medicine more often than directed. Remember that you will need to pass more urine after taking this medicine. Do not take your doses at a time of day that will cause you problems. Do not take at bedtime. Talk to your pediatrician regarding the use of this medicine in children. While this drug may be prescribed for selected conditions, precautions do apply. Overdosage: If you think you have taken too much of this medicine contact a poison control center or emergency room at once. NOTE: This medicine is only for you. Do not share this medicine with others. What if I miss a dose? If you miss a dose, take it as soon as you can. If it is almost time for your next dose, take only that dose. Do not take double or extra doses. What may interact with this medicine? Do not take this medicine with any of the  following medications: -eplerenone This medicine may also interact with the following medications: -corticosteroids -digoxin -lithium -medicines for high blood pressure like ACE inhibitors -skeletal muscle relaxants like tubocurarine -NSAIDs, medicines for pain and inflammation, like ibuprofen or naproxen -potassium products like salt substitute or supplements -pressor amines like norepinephrine -some diuretics This list may not describe all possible interactions. Give your health care provider a list of all the medicines, herbs, non-prescription drugs, or dietary supplements you use. Also tell them if you smoke, drink alcohol, or use illegal drugs. Some items may interact with your medicine. What should I watch for while using this medicine? Visit your doctor or health care professional for regular checks on your progress. Check your blood pressure as directed. Ask your doctor what your blood pressure should be, and when you should contact them. You may need to be on a special diet while taking this medicine. Ask your doctor. Also, ask how many glasses of fluid you need to drink a day. You must not get dehydrated. This medicine may make you feel confused, dizzy or lightheaded. Drinking alcohol and taking some medicines can make this worse. Do not drive, use machinery, or do anything that needs mental alertness until you know how this medicine affects you. Do not sit or stand up quickly. What side effects may I notice from receiving this medicine? Side effects that you should report to your doctor or health care professional as soon as possible: -allergic reactions such as skin rash or itching, hives, swelling of the  lips, mouth, tongue, or throat -black or tarry stools -fast, irregular heartbeat -fever -muscle pain, cramps -numbness, tingling in hands or feet -trouble breathing -trouble passing urine -unusual bleeding -unusually weak or tired Side effects that usually do not require  medical attention (report to your doctor or health care professional if they continue or are bothersome): -change in voice or hair growth -confusion -dizzy, drowsy -dry mouth, increased thirst -enlarged or tender breasts -headache -irregular menstrual periods -sexual difficulty, unable to have an erection -stomach upset This list may not describe all possible side effects. Call your doctor for medical advice about side effects. You may report side effects to FDA at 1-800-FDA-1088. Where should I keep my medicine? Keep out of the reach of children. Store below 25 degrees C (77 degrees F). Throw away any unused medicine after the expiration date. NOTE: This sheet is a summary. It may not cover all possible information. If you have questions about this medicine, talk to your doctor, pharmacist, or health care provider.  2018 Elsevier/Gold Standard (2010-03-29 12:51:30)

## 2017-03-27 ENCOUNTER — Ambulatory Visit: Payer: Medicare HMO | Attending: Orthopaedic Surgery | Admitting: Physical Therapy

## 2017-03-27 ENCOUNTER — Ambulatory Visit (INDEPENDENT_AMBULATORY_CARE_PROVIDER_SITE_OTHER): Payer: Medicare HMO

## 2017-03-27 ENCOUNTER — Encounter: Payer: Self-pay | Admitting: Physical Therapy

## 2017-03-27 ENCOUNTER — Telehealth (HOSPITAL_COMMUNITY): Payer: Self-pay | Admitting: Emergency Medicine

## 2017-03-27 ENCOUNTER — Ambulatory Visit (HOSPITAL_COMMUNITY)
Admission: EM | Admit: 2017-03-27 | Discharge: 2017-03-27 | Disposition: A | Discharge status: Home / Self Care | Payer: Medicare HMO | Attending: Physician Assistant | Admitting: Physician Assistant

## 2017-03-27 ENCOUNTER — Encounter (HOSPITAL_COMMUNITY): Payer: Self-pay | Admitting: Family Medicine

## 2017-03-27 DIAGNOSIS — R52 Pain, unspecified: Secondary | ICD-10-CM

## 2017-03-27 DIAGNOSIS — M62838 Other muscle spasm: Secondary | ICD-10-CM | POA: Insufficient documentation

## 2017-03-27 DIAGNOSIS — R293 Abnormal posture: Secondary | ICD-10-CM | POA: Diagnosis not present

## 2017-03-27 DIAGNOSIS — M542 Cervicalgia: Secondary | ICD-10-CM | POA: Insufficient documentation

## 2017-03-27 DIAGNOSIS — M25511 Pain in right shoulder: Secondary | ICD-10-CM

## 2017-03-27 DIAGNOSIS — M25552 Pain in left hip: Secondary | ICD-10-CM

## 2017-03-27 DIAGNOSIS — M25562 Pain in left knee: Secondary | ICD-10-CM | POA: Diagnosis not present

## 2017-03-27 MED ORDER — CYCLOBENZAPRINE HCL 5 MG PO TABS: 2.5000 mg | ORAL_TABLET | Freq: Two times a day (BID) | ORAL | 0 refills | Status: DC | PRN

## 2017-03-27 NOTE — Discharge Instructions (Signed)
Avoid flexeril if you can.  Stick to taking tylenol 1000 mg every 6-8 hours for pain.

## 2017-03-27 NOTE — ED Provider Notes (Signed)
03/27/2017 6:40 PM   DOB: [birth year] / MRN: [id number]  SUBJECTIVE:  [name] is a 83 y.o. female presenting for pain that started after a fall.  States her right shoulder and left knee and hip were hurt during the fall.  Denies any weakness or change in ROM or strength about the injured joints.  Takes tylenol for pain usually.  Has a history of prediabetes, OA, neck arthritis. Is ambulating normally for her. No history of diabetes.   Has a history of HTN and is followed by her PCP PA Altamease Oiler.   She is allergic to penicillins and tetanus toxoid.   She  has a past medical history of CAP (community acquired pneumonia) (09/21/2015); Chronic kidney disease, stage III (moderate); Encounter for long-term (current) use of other medications; Glaucoma (increased eye pressure); Head pain; Hypertension; Impaired glucose tolerance test; Insomnia, unspecified; Morbid obesity ([place]); Neck pain; Osteoarthrosis, unspecified whether generalized or localized, other specified sites; Other lymphedema; Pain in joint, multiple sites; and Primary open-angle glaucoma(365.11).    She  reports that she has never smoked. She has never used smokeless tobacco. She reports that she does not drink alcohol or use drugs. She  has no sexual activity history on file. The patient  has a past surgical history that includes Eye surgery and Abdominal hysterectomy.  Her family history includes Cancer in her father.  Review of Systems  Constitutional: Negative for chills, diaphoresis and fever.  Eyes: Negative.   Respiratory: Negative for cough, hemoptysis, sputum production, shortness of breath and wheezing.   Cardiovascular: Negative for chest pain, orthopnea and leg swelling.  Gastrointestinal: Negative for nausea.  Skin: Negative for rash.  Neurological: Negative for dizziness, sensory change, speech change, focal weakness and headaches.    OBJECTIVE:  BP (!) 156/48   Pulse (!) 57   Temp 98 F (36.7 C)   Resp 18   SpO2  100%   Physical Exam  Constitutional: She is active.  Non-toxic appearance.  Cardiovascular: Normal rate.   Pulmonary/Chest: Effort normal. No tachypnea.  Musculoskeletal:       Right shoulder: She exhibits decreased range of motion and tenderness. She exhibits no bony tenderness, no swelling, no effusion, no crepitus, no deformity, no laceration, no pain, normal pulse and normal strength.       Left hip: She exhibits tenderness. She exhibits normal range of motion, normal strength, no bony tenderness, no swelling, no crepitus, no deformity and no laceration.       Left knee: She exhibits normal range of motion, no effusion, normal patellar mobility, no bony tenderness, normal meniscus and no MCL laxity. Tenderness found. No medial joint line, no lateral joint line, no MCL, no LCL and no patellar tendon tenderness noted.  Neurological: She is alert.  Skin: Skin is warm and dry. She is not diaphoretic. No pallor.    Results for orders placed or performed in visit on 03/26/17 (from the past 72 hour(s))  HgB A1c     Status: None   Collection Time: 03/26/17  9:40 AM  Result Value Ref Range   Hemoglobin A1C 5.9    Lab Results  Component Value Date   CREATININE 1.14 (H) 01/23/2017   BUN 22 01/23/2017   NA 140 01/23/2017   K 4.7 01/23/2017   CL 101 01/23/2017   CO2 28 01/23/2017   Lab Results  Component Value Date   ALT 16 11/06/2016   AST 23 11/06/2016   ALKPHOS 110 11/06/2016   BILITOT 0.7 11/06/2016  Dg Shoulder Right  Result Date: 03/27/2017 CLINICAL DATA:  Fall with right shoulder pain EXAM: RIGHT SHOULDER - 2+ VIEW COMPARISON:  None. FINDINGS: Moderate AC joint degenerative changes. No acute fracture or malalignment. Prominent tendinous soft tissue calcifications. Right lung apex is clear IMPRESSION: 1. No acute osseous abnormality 2. Calcific tendinitis. Electronically Signed   By: Donavan Foil M.D.   On: 03/27/2017 18:36   Dg Knee Complete 4 Views Left  Result Date:  03/27/2017 CLINICAL DATA:  Fall with knee pain EXAM: LEFT KNEE - COMPLETE 4+ VIEW COMPARISON:  12/20/2015 FINDINGS: No acute displaced fracture or malalignment. Moderate tricompartment degenerative changes with narrowing and bony spurring. No large effusion. Joint space calcification consistent with chondrocalcinosis. IMPRESSION: Moderate degenerative changes of the left knee without acute osseous abnormality. Chondrocalcinosis Electronically Signed   By: Donavan Foil M.D.   On: 03/27/2017 18:35   Dg Hip Unilat With Pelvis 2-3 Views Left  Result Date: 03/27/2017 CLINICAL DATA:  Fall with left hip pain EXAM: DG HIP (WITH OR WITHOUT PELVIS) 2-3V LEFT COMPARISON:  None. FINDINGS: Right great live than left SI joint degenerative change. Pubic symphysis is intact. Pubic rami are within normal limits. Moderate degenerative changes of the bilateral hips. No acute fracture or dislocation of the left hip. Vascular calcifications. IMPRESSION: Moderate degenerative changes of the hips. No definite acute osseous abnormality. Electronically Signed   By: Donavan Foil M.D.   On: 03/27/2017 18:33    ASSESSMENT AND PLAN:  The primary encounter diagnosis was Acute pain of right shoulder. Diagnoses of Pain, Acute pain of left knee, and Acute pain of left hip were also pertinent to this visit. Medication options discussed with patient.  Given age and co-morbid conditions I've advised that she take Tylenol 1000 mg every 6-8 hours for pain.  She has taken flexeril before without side effects.  Will try her on 2.5 mg q12 as needed.     The patient is advised to call or return to clinic if she does not see an improvement in symptoms, or to seek the care of the closest emergency department if she worsens with the above plan.   Philis Fendt, MHS, PA-C 03/27/2017 6:40 PM   Tereasa Coop, PA-C 03/27/17 [id number]
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
## 2017-03-27 NOTE — Telephone Encounter (Signed)
Pt requested for Flexeril to be e-Rx to [facility])

## 2017-03-28 ENCOUNTER — Encounter: Payer: Self-pay | Admitting: Physical Therapy

## 2017-03-28 NOTE — Therapy (Signed)
[place], [place], [address] Phone: [phone]   Fax:  [phone]  Physical Therapy Evaluation  Patient Details  Name: [name] MRN: [id number] Date of Birth: 1930-07-27 Referring Provider: Dr Valeria Batman   Encounter Date: 03/27/2017      PT End of Session - 03/28/17 1045    Visit Number 1   Number of Visits 16   Date for PT Re-Evaluation 05/23/17   Authorization Type [employer] 40 dollar Co-pay    PT Start Time 0845   PT Stop Time 0930   PT Time Calculation (min) 45 min   Activity Tolerance Patient tolerated treatment well   Behavior During Therapy [facility] for tasks assessed/performed      Past Medical History:  Diagnosis Date  . CAP (community acquired pneumonia) 09/21/2015  . Chronic kidney disease, stage III (moderate)   . Encounter for long-term (current) use of other medications   . Glaucoma (increased eye pressure)   . Head pain   . Hypertension   . Impaired glucose tolerance test   . Insomnia, unspecified   . Morbid obesity ([place])   . Neck pain   . Osteoarthrosis, unspecified whether generalized or localized, other specified sites   . Other lymphedema   . Pain in joint, multiple sites   . Primary open-angle glaucoma(365.11)     Past Surgical History:  Procedure Laterality Date  . ABDOMINAL HYSTERECTOMY    . EYE SURGERY      There were no vitals filed for this visit.       Subjective Assessment - 03/27/17 0854    Subjective Patient has a long history of right sided cervical pain. The pain is constant. She feels like her neck cracks and pops frequently.    Diagnostic tests X-ray 8/15: Spuring and decreased disc space from c3-c7    Patient Stated Goals to have less pain in her neck    Currently in Pain? Yes   Pain Score 8    Pain Location Neck   Pain Orientation Right   Pain Descriptors / Indicators Aching   Pain Type Chronic pain   Pain Onset More than a month ago   Pain  Frequency Constant   Aggravating Factors  turning her head    Pain Relieving Factors warm compress    Effect of Pain on Daily Activities difficulty perfroming daily tasks             [facility] PT Assessment - 03/28/17 0001      Assessment   Medical Diagnosis Right sided cervical pain   Referring Provider Dr Valeria Batman    Onset Date/Surgical Date --  several years    Hand Dominance Right   Next MD Visit 3 weeks    Prior Therapy None      Precautions   Precautions Fall   Precaution Comments Uses a cane      Restrictions   Weight Bearing Restrictions No     Balance Screen   Has the patient fallen in the past 6 months No   Has the patient had a decrease in activity level because of a fear of falling?  No   Is the patient reluctant to leave their home because of a fear of falling?  No     Home Environment   Additional Comments Nothing significant      Prior Function   Level of Independence Independent   Vocation Retired     Associate Professor   Overall  Cognitive Status Within Functional Limits for tasks assessed   Attention Focused   Focused Attention Appears intact   Memory Appears intact   Awareness Appears intact   Problem Solving Appears intact     Observation/Other Assessments   Observations mild HOH      Sensation   Light Touch Appears Intact   Additional Comments tingling in her hand when she holds objects on the right      Coordination   Gross Motor Movements are Fluid and Coordinated Yes   Fine Motor Movements are Fluid and Coordinated Yes     Posture/Postural Control   Posture Comments Significant limitations; forward head; throacic kyphosis;      AROM   Cervical Flexion 30   Cervical Extension 20   Cervical - Right Rotation 22 with pain    Cervical - Left Rotation 30 pain      PROM   Overall PROM Comments limited shoulder flexion     Strength   Right/Left Shoulder Right;Left   Right Shoulder Flexion 4/5   Right Shoulder ABduction 4/5   Right  Shoulder Internal Rotation 4+/5   Right Shoulder External Rotation 4/5   Left Shoulder Flexion 4/5   Left Shoulder ABduction 4+/5   Left Shoulder Internal Rotation 5/5   Left Shoulder External Rotation 4+/5     Palpation   Palpation comment Spasming in bilateral upper traps and into the cervical spine L > R      Ambulation/Gait   Gait Comments walks with single point cane in her right hand; Shuffling gait             Objective measurements completed on examination: See above findings.          [facility] Adult PT Treatment/Exercise - 03/28/17 0001      Neck Exercises: Seated   Other Seated Exercise seated rotation 2-3 times with cuing to find end range    Other Seated Exercise seated scapular retractionx10      Shoulder Exercises: Supine   Other Supine Exercises supine wand flexion x10      Manual Therapy   Manual Therapy Soft tissue mobilization;Manual Traction   Soft tissue mobilization to upper traps and cervical paraspinals    Manual Traction light manual traction                 PT Education - 03/28/17 1042    Education provided Yes   Education Details reviewed HEP, reviewed the improtance of symptom mangement    Person(s) Educated Patient   Methods Explanation;Demonstration;Tactile cues;Verbal cues   Comprehension Verbalized understanding;Returned demonstration;Verbal cues required;Tactile cues required          PT Short Term Goals - 03/28/17 1103      PT SHORT TERM GOAL #1   Title Patient will increase bilateral cervical rotation to 45 degrees without pain    Time 4   Period Weeks   Status New     PT SHORT TERM GOAL #2   Title Patient will demsotrate 5/5 gross bilateral upper extremity strength    Time 4   Period Weeks   Status New     PT SHORT TERM GOAL #3   Title Patient will be independnent with basic HEP for posture   Time 4   Period Weeks   Status New           PT Long Term Goals - 03/28/17 1104      PT LONG TERM GOAL #1    Title Patient  will sit for 10 minutes without cuing to stay in good postrue in order to decrease overall pain    Time 8   Period Weeks   Status New     PT LONG TERM GOAL #2   Title Patient will demsotrate 60 degrees of bilateral cervical rotation in order to improve safety in the community   Time 8   Period Weeks   Status New     PT LONG TERM GOAL #3   Title Patient will demsotrate a 44% limitation on FOTO    Time 8   Period Weeks   Status New                Plan - 03/28/17 1048    Clinical Impression Statement Patient is an 83 year old female who presents with right cervical pain. She has significant cervical motion loss with all movements. She has tendenress to palpation. She has shoulder weakness. She has significant postural defcits. She will only be able to come few times 2nd to financial constraints. She would benefit from posutral correction as well as manual therapy to decrease spasming.    History and Personal Factors relevant to plan of care: increasing pain as well as decreasing mobility    Clinical Presentation Evolving   Clinical Presentation due to: significant postural decicits, mobility deficits, and pain   Clinical Decision Making Moderate   Rehab Potential Good   PT Frequency 2x / week   PT Duration 8 weeks   PT Treatment/Interventions ADLs/Self Care Home Management;Cryotherapy;Electrical Stimulation;Iontophoresis 4mg /ml Dexamethasone;Moist Heat;Traction;Ultrasound;Therapeutic activities;Therapeutic exercise;Neuromuscular re-education;Patient/family education;Manual techniques;Passive range of motion;Dry needling;Taping   PT Next Visit Plan manual therapy for light traction; wall posutre; review self mobilization with a towel but may not be able to; shouldet extension; review decompresion position; consider modalities but will only be able to come for a few visits.    PT Home Exercise Plan continue with HEP    Consulted and Agree with Plan of Care Patient       Patient will benefit from skilled therapeutic intervention in order to improve the following deficits and impairments:  Decreased strength, Postural dysfunction, Decreased knowledge of use of DME, Pain, Decreased range of motion, Decreased activity tolerance, Increased fascial restricitons  Visit Diagnosis: Abnormal posture - Plan: PT plan of care cert/re-cert  Cervicalgia - Plan: PT plan of care cert/re-cert  Other muscle spasm - Plan: PT plan of care cert/re-cert      G-Codes - 37/62/83 1158    Functional Assessment Tool Used (Outpatient Only) FOTO, clinical decision making    Functional Limitation Changing and maintaining body position   Changing and Maintaining Body Position Current Status (T5176) At least 60 percent but less than 80 percent impaired, limited or restricted   Changing and Maintaining Body Position Goal Status (H6073) At least 40 percent but less than 60 percent impaired, limited or restricted       Problem List Patient Active Problem List   Diagnosis Date Noted  . Mitral valve insufficiency 03/19/2017  . Other spondylosis with radiculopathy, cervical region 03/14/2017  . Cerumen impaction 12/04/2016  . Generalized weakness 09/22/2015  . Acidosis 09/22/2015  . Acute encephalopathy 09/22/2015  . Hyperglycemia 09/22/2015  . Leukopenia 09/22/2015  . CAP (community acquired pneumonia) 09/22/2015  . Bilateral leg edema 09/22/2015  . Constipation 09/22/2015  . Snoring 09/13/2015  . Glaucoma (increased eye pressure)   . Hypertension   . Neck pain   . Head pain   . Pain in joint,  multiple sites   . Impaired glucose tolerance test   . Osteoarthrosis, unspecified whether generalized or localized, other specified sites   . Morbid obesity ([place])   . Encounter for long-term (current) use of other medications   . Other lymphedema   . Insomnia, unspecified   . Chronic kidney disease, stage III (moderate)   . Primary open-angle glaucoma(365.11)     Carney Living 03/28/2017, 12:06 PM  [facility] [address] [place], [place], [address] Phone: [phone]   Fax:  [phone]  Name: [name] MRN: [id number] Date of Birth: [birth year]

## 2017-03-28 NOTE — Patient Instructions (Addendum)
  Patient instructed to do rotation 2-3 x

## 2017-03-29 ENCOUNTER — Telehealth (INDEPENDENT_AMBULATORY_CARE_PROVIDER_SITE_OTHER): Payer: Self-pay | Admitting: Physician Assistant

## 2017-03-29 LAB — COMPREHENSIVE METABOLIC PANEL
A/G RATIO: 1.3 (ref 1.2–2.2)
ALBUMIN: 3.8 g/dL (ref 3.5–4.7)
ALT: 13 IU/L (ref 0–32)
AST: 19 IU/L (ref 0–40)
Alkaline Phosphatase: 89 IU/L (ref 39–117)
BUN/Creatinine Ratio: 16 (ref 12–28)
BUN: 17 mg/dL (ref 8–27)
Bilirubin Total: 0.6 mg/dL (ref 0.0–1.2)
CALCIUM: 9.6 mg/dL (ref 8.7–10.3)
CO2: 26 mmol/L (ref 20–29)
CREATININE: 1.09 mg/dL — AB (ref 0.57–1.00)
Chloride: 104 mmol/L (ref 96–106)
GFR calc Af Amer: 54 mL/min/{1.73_m2} — ABNORMAL LOW (ref >59)
GFR, EST NON AFRICAN AMERICAN: 47 mL/min/{1.73_m2} — AB (ref >59)
GLOBULIN, TOTAL: 2.9 g/dL (ref 1.5–4.5)
Glucose: 85 mg/dL (ref 65–99)
Potassium: 4.9 mmol/L (ref 3.5–5.2)
Sodium: 142 mmol/L (ref 134–144)
Total Protein: 6.7 g/dL (ref 6.0–8.5)

## 2017-03-29 NOTE — Telephone Encounter (Signed)
Pt called to speak with the nurse or the pcp since she was sent potassium med that she already has, she need to know if she take the old med or the new one, please call her back

## 2017-03-30 NOTE — Telephone Encounter (Signed)
Patient informed not to take potassium pills and the need to have potassium levels checked periodically. [name], CMA

## 2017-03-30 NOTE — Telephone Encounter (Signed)
Patient's last potassium level was normal while she was supposedly off potassium supplementation. She does not have to take potassium but needs to have blood drawn periodically to make sure potassium level is stable.

## 2017-03-30 NOTE — Telephone Encounter (Signed)
Patient states she was taken off potassium and when she picked up her refills she was given more potassium. Wants to know if she needs to be taking this medication? [name], CMA

## 2017-04-03 ENCOUNTER — Ambulatory Visit (HOSPITAL_COMMUNITY)
Admission: RE | Admit: 2017-04-03 | Discharge: 2017-04-03 | Disposition: A | Discharge status: Home / Self Care | Payer: Medicare HMO | Source: Ambulatory Visit | Attending: Physician Assistant | Admitting: Physician Assistant

## 2017-04-03 DIAGNOSIS — N83201 Unspecified ovarian cyst, right side: Secondary | ICD-10-CM | POA: Diagnosis not present

## 2017-04-03 DIAGNOSIS — R102 Pelvic and perineal pain: Secondary | ICD-10-CM

## 2017-04-04 NOTE — Telephone Encounter (Signed)
-----   Message from Clent Demark, PA-C sent at 04/04/2017  8:38 AM EDT ----- Mild decrease in size of right adnexal cystic structure, most consistent with a benign etiology. Continue yearly follow-up.

## 2017-04-04 NOTE — Telephone Encounter (Signed)
Patient verified DOB Patient is aware of cyst showing a mild decrease which is consistent with the etiology of being benign. Patient has an appointment on 04/12/17 with [facility] and would like to know if this referral is necessary at this time.  MA informed patient of concern being relayed to PCP for advise and a follow up call will be conducted. No further questions at this time.

## 2017-04-05 ENCOUNTER — Encounter: Payer: Self-pay | Admitting: Physical Therapy

## 2017-04-05 ENCOUNTER — Ambulatory Visit: Payer: Medicare HMO | Attending: Orthopaedic Surgery | Admitting: Physical Therapy

## 2017-04-05 DIAGNOSIS — R293 Abnormal posture: Secondary | ICD-10-CM | POA: Diagnosis not present

## 2017-04-05 DIAGNOSIS — M62838 Other muscle spasm: Secondary | ICD-10-CM | POA: Diagnosis present

## 2017-04-05 DIAGNOSIS — M542 Cervicalgia: Secondary | ICD-10-CM | POA: Diagnosis present

## 2017-04-05 NOTE — Patient Instructions (Signed)
Walk Up Exercise (Active/Assistive)    With elbow straight, slide wash cloth 10 x and follow hands with eyes on right and left   Repeat __10__ times. Do ___2_ sessions per day.  Copyright  VHI. All rights reserved.   Voncille Lo, PT Certified Exercise Expert for the Aging Adult  04/05/17 9:17 AM Phone: [phone] Fax: [phone]
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
## 2017-04-11 ENCOUNTER — Ambulatory Visit (INDEPENDENT_AMBULATORY_CARE_PROVIDER_SITE_OTHER): Payer: Medicare HMO | Admitting: Orthopaedic Surgery

## 2017-04-11 ENCOUNTER — Encounter (INDEPENDENT_AMBULATORY_CARE_PROVIDER_SITE_OTHER): Payer: Self-pay | Admitting: Orthopaedic Surgery

## 2017-04-11 VITALS — BP 140/59 | HR 56

## 2017-04-11 DIAGNOSIS — M47812 Spondylosis without myelopathy or radiculopathy, cervical region: Secondary | ICD-10-CM

## 2017-04-11 NOTE — Progress Notes (Signed)
Office Visit Note   Patient: [name]           Date of Birth: February 15, 1930           MRN: [id number] Visit Date: 04/11/2017              Requested by: Clent Demark, PA-C [place], [place] [address] PCP: Clent Demark, PA-C   Assessment & Plan: Visit Diagnoses:  1. Spondylosis without myelopathy or radiculopathy, cervical region     Plan: Patient can the attend therapy visits as the she's able due to high out-of-pocket cost that she has for her plan. She is doing exercises on her own at home. We discussed radicular symptoms that they progress she can return otherwise office follow-up as needed.  Follow-Up Instructions: Return if symptoms worsen or fail to improve.   Orders:  No orders of the defined types were placed in this encounter.  No orders of the defined types were placed in this encounter.     Procedures: No procedures performed   Clinical Data: No additional findings.   Subjective: Chief Complaint  Patient presents with  . Neck - Pain, Follow-up    HPI 83 year old female returns for follow-up for cervical spondylosis. She's gone to therapy but is been prohibitive due to out-of-pocket cost going more frequently. She states she's feeling a little bit better less snapping and cracking in her neck. She's used Tylenol twice a day. She is ambulatory with a cane and can ambulate with a short stride gait without the cane. She's had occasional numbness in her hands. She denies associated bowel or bladder symptoms.  Review of Systems 14 point review of systems updated unchanged other than as mentioned in history of present illness from 03/14/2017 office visit.   Objective: Vital Signs: BP (!) 140/59   Pulse (!) 56   Physical Exam  Constitutional: She is oriented to person, place, and time. She appears well-developed.  HENT:  Head: Normocephalic.  Right Ear: External ear normal.  Left Ear: External ear normal.  Eyes: Pupils are  equal, round, and reactive to light.  Neck: No tracheal deviation present. No thyromegaly present.  Cardiovascular: Normal rate.   Pulmonary/Chest: Effort normal.  Abdominal: Soft.  Neurological: She is alert and oriented to person, place, and time.  Skin: Skin is warm and dry.  Psychiatric: She has a normal mood and affect. Her behavior is normal.   decreased visual acuity minimal vision left eye previous corneal transplant. She uses a magnifying glass for reading.  Ortho Exam Patient's Nexium flexed position she has some mild discomfort with extension. Mild thenar atrophy on the right without hyperthenar atrophy. Well healed carpal tunnel incisions. Good flexion-extension of her fingers. Specialty Comments:  No specialty comments available.  Imaging: No results found.   PMFS History: Patient Active Problem List   Diagnosis Date Noted  . Mitral valve insufficiency 03/19/2017  . Other spondylosis with radiculopathy, cervical region 03/14/2017  . Cerumen impaction 12/04/2016  . Generalized weakness 09/22/2015  . Acidosis 09/22/2015  . Acute encephalopathy 09/22/2015  . Hyperglycemia 09/22/2015  . Leukopenia 09/22/2015  . CAP (community acquired pneumonia) 09/22/2015  . Bilateral leg edema 09/22/2015  . Constipation 09/22/2015  . Snoring 09/13/2015  . Glaucoma (increased eye pressure)   . Hypertension   . Neck pain   . Head pain   . Pain in joint, multiple sites   . Impaired glucose tolerance test   . Osteoarthrosis, unspecified whether generalized  or localized, other specified sites   . Morbid obesity ([place])   . Encounter for long-term (current) use of other medications   . Other lymphedema   . Insomnia, unspecified   . Chronic kidney disease, stage III (moderate)   . Primary open-angle glaucoma(365.11)    Past Medical History:  Diagnosis Date  . CAP (community acquired pneumonia) 09/21/2015  . Chronic kidney disease, stage III (moderate)   . Encounter for long-term  (current) use of other medications   . Glaucoma (increased eye pressure)   . Head pain   . Hypertension   . Impaired glucose tolerance test   . Insomnia, unspecified   . Morbid obesity ([place])   . Neck pain   . Osteoarthrosis, unspecified whether generalized or localized, other specified sites   . Other lymphedema   . Pain in joint, multiple sites   . Primary open-angle glaucoma(365.11)     Family History  Problem Relation Age of Onset  . Cancer Father     Past Surgical History:  Procedure Laterality Date  . ABDOMINAL HYSTERECTOMY    . EYE SURGERY     Social History   Occupational History  . retired    Social History Main Topics  . Smoking status: Never Smoker  . Smokeless tobacco: Never Used  . Alcohol use No  . Drug use: No  . Sexual activity: Not on file

## 2017-04-12 ENCOUNTER — Ambulatory Visit: Payer: Medicare HMO | Admitting: Physical Therapy

## 2017-05-03 ENCOUNTER — Ambulatory Visit: Payer: Medicare HMO | Attending: Physician Assistant | Admitting: Physical Therapy

## 2017-05-03 ENCOUNTER — Encounter: Payer: Self-pay | Admitting: Physical Therapy

## 2017-05-03 DIAGNOSIS — M62838 Other muscle spasm: Secondary | ICD-10-CM

## 2017-05-03 DIAGNOSIS — R293 Abnormal posture: Secondary | ICD-10-CM | POA: Diagnosis not present

## 2017-05-03 DIAGNOSIS — M542 Cervicalgia: Secondary | ICD-10-CM | POA: Diagnosis present

## 2017-05-03 NOTE — Patient Instructions (Addendum)
sTRETCHES FIRST!  Flexors, Supine    Lie on back, head on small, rolled towel. Nod head, tipping chin down. Tighten muscles in back of throat. Hold _10__ seconds. Repeat _5__ times per session. Do _2__ sessions per day.  Copyright  VHI. All rights reserved.       ON ALL THESE EXERCIES REMEMBER TO KEEP YOUR CHIN DOWN.  Over Head Pull: Narrow Grip       On back, knees bent, feet flat, band across thighs, elbows straight but relaxed. Pull hands apart (start). Keeping elbows straight, bring arms up and over head, hands toward floor. Keep pull steady on band. Hold momentarily. Return slowly, keeping pull steady, back to start. Repeat _10__ times. Band color _RED_____   Side Pull: Double Arm   On back, knees bent, feet flat. Arms perpendicular to body, shoulder level, elbows straight but relaxed. Pull arms out to sides, elbows straight. Resistance band comes across collarbones, hands toward floor. Hold momentarily. Slowly return to starting position. Repeat _10__ times. Band color RED_____   Sash   On back, knees bent, feet flat, left hand on left hip, right hand above left. Pull right arm DIAGONALLY (hip to shoulder) across chest. Bring right arm along head toward floor. Hold momentarily. Slowly return to starting position. Repeat _10__ times. Do with left arm. Band color _red_____   Shoulder Rotation: Double Arm   On back, knees bent, feet flat, elbows tucked at sides, bent 90, hands palms up. Pull hands apart and down toward floor, keeping elbows near sides. Hold momentarily. Slowly return to starting position. Repeat _10__ times. Band color _red_____   Voncille Lo, PT Certified Exercise Expert for the Aging Adult  05/03/17 12:08 PM Phone: [phone] Fax: [phone]

## 2017-05-03 NOTE — Therapy (Addendum)
[place], [place], [address] Phone: [phone]   Fax:  [phone]  Physical Therapy Treatment/Discharge Note  Patient Details  Name: [name] MRN: [id number] Date of Birth: 30-Jan-1930 Referring Provider: Dr Valeria Batman   Encounter Date: 05/03/2017      PT End of Session - 05/03/17 1228    Visit Number 3   Number of Visits 16   Date for PT Re-Evaluation 05/23/17   Authorization Type [employer] 40 dollar Co-pay    PT Start Time 1147   PT Stop Time 1246   PT Time Calculation (min) 59 min   Activity Tolerance Patient tolerated treatment well   Behavior During Therapy [facility] for tasks assessed/performed      Past Medical History:  Diagnosis Date  . CAP (community acquired pneumonia) 09/21/2015  . Chronic kidney disease, stage III (moderate) (HCC)   . Encounter for long-term (current) use of other medications   . Glaucoma (increased eye pressure)   . Head pain   . Hypertension   . Impaired glucose tolerance test   . Insomnia, unspecified   . Morbid obesity ([place])   . Neck pain   . Osteoarthrosis, unspecified whether generalized or localized, other specified sites   . Other lymphedema   . Pain in joint, multiple sites   . Primary open-angle glaucoma(365.11)     Past Surgical History:  Procedure Laterality Date  . ABDOMINAL HYSTERECTOMY    . EYE SURGERY      There were no vitals filed for this visit.      Subjective Assessment - 05/03/17 1148    Subjective Pt states that her neck is better now.   she may or may not come back for last appt   Diagnostic tests X-ray 8/15: Spuring and decreased disc space from c3-c7 . right shoulder x ray 03-27-17  no fracture, just calcific tendonitis   Pain Score 4    Pain Location Neck   Pain Orientation Right   Pain Descriptors / Indicators Aching   Pain Onset More than a month ago            [facility] PT Assessment - 05/03/17 1209      Assessment    Medical Diagnosis Right sided cervical pain   Hand Dominance Right     Precautions   Precautions Fall   Precaution Comments Uses a cane      Observation/Other Assessments   Observations mild HOH    Focus on Therapeutic Outcomes (FOTO)  FOTO intake 54% liimted46% predicted 44%     Sensation   Additional Comments still has tingling in right hand     Posture/Postural Control   Posture Comments Significant limitations; forward head; thoracic kyphosis;      AROM   Cervical Flexion 30   Cervical Extension 20   Cervical - Right Rotation 35  no pain but blocked   Cervical - Left Rotation 45  no pain but blocked     PROM   Overall PROM Comments --     Strength   Right Shoulder Flexion 4/5   Right Shoulder ABduction 4/5   Right Shoulder Internal Rotation 4+/5   Right Shoulder External Rotation 4/5   Left Shoulder Flexion 4/5   Left Shoulder ABduction 4+/5   Left Shoulder Internal Rotation 5/5   Left Shoulder External Rotation 4+/5     Palpation   Palpation comment --     Ambulation/Gait   Gait Comments walks with single  point cane in her right hand; Shuffling gait                      [facility] Adult PT Treatment/Exercise - 05/03/17 1209      Self-Care   Self-Care Posture   Posture reviewed posture for sitting and standing and home use     Shoulder Exercises: Supine   Other Supine Exercises supine scapular stabilizers flex, horizontal abd , diagonals and ER bil with red t band     Shoulder Exercises: Standing   External Rotation 10 reps;Strengthening;Both   Theraband Level (Shoulder External Rotation) Level 2 (Red)   Extension Strengthening;10 reps;Theraband   Theraband Level (Shoulder Extension) Level 2 (Red)   Extension Limitations bil     Neck Exercises: Stretches   Upper Trapezius Stretch 2 reps;30 seconds   Upper Trapezius Stretch Limitations Pt vc and tc bil   Levator Stretch 2 reps;30 seconds   Levator Stretch Limitations bil vc tc   Other Neck  Stretches deep neck flexor stretch with towel on head 10 sec hold x 5                  PT Short Term Goals - 05/03/17 1238      PT SHORT TERM GOAL #1   Title Patient will increase bilateral cervical rotation to 45 degrees without pain    Baseline left is now 45 degrees to left with hard endfeel   Time 4   Period Weeks   Status Partially Met     PT SHORT TERM GOAL #2   Title Patient will demsotrate 5/5 gross bilateral upper extremity strength    Baseline Pt is grossly 4 to 4+/5 in available range  See flowsheet   Time 4   Period Weeks   Status On-going     PT SHORT TERM GOAL #3   Title Patient will be independnent with basic HEP for posture   Baseline given full HEP for strength and flexibility   Time 4   Period Weeks   Status Achieved           PT Long Term Goals - 05/03/17 1242      PT LONG TERM GOAL #1   Title Patient will sit for 10 minutes without cuing to stay in good postrue in order to decrease overall pain    Baseline pt given intial education   Time 8   Period Weeks   Status On-going     PT LONG TERM GOAL #2   Title Patient will demsotrate 60 degrees of bilateral cervical rotation in order to improve safety in the community   Baseline See flowsheet right 35 , left 45   Time 8   Period Weeks   Status On-going     PT LONG TERM GOAL #3   Title Patient will demsotrate a 44% limitation on FOTO    Baseline Pt 46% limitation 05-03-17   Time 8   Period Weeks   Status On-going               Plan - 05/03/17 1248    Clinical Impression Statement Pt has not come to PT for 1 month due to finances and has transportation bringing her and it takes a lot of time for her to come to PT. Pt has achieved goal of independence with basic HEP and Posture.  She has increased 45 degrees rotation to left and 35 to right.  Pt does have hard end feel at  45 degrees.  Pt  has 4/10 pain and much improved from inital evaluation.  See Flowsheet for MMT and AROM update    Rehab Potential Good   PT Frequency 2x / week   PT Duration 8 weeks   PT Treatment/Interventions ADLs/Self Care Home Management;Cryotherapy;Electrical Stimulation;Iontophoresis '4mg'$ /ml Dexamethasone;Moist Heat;Traction;Ultrasound;Therapeutic activities;Therapeutic exercise;Neuromuscular re-education;Patient/family education;Manual techniques;Passive range of motion;Dry needling;Taping   PT Next Visit Plan ; review self mobilization with a towel but may not be able to; shouldet extension; review decompresion position; Pt may only be able to come for one more visit.    PT Home Exercise Plan continue with HEP  with added MHP with supine cane and wall slide with UE and eye movement following to increase evenvironmental scanning, supine scapular stabiliZers.  neck stretches  and deep neck flexors   Consulted and Agree with Plan of Care Patient      Patient will benefit from skilled therapeutic intervention in order to improve the following deficits and impairments:  Decreased strength, Postural dysfunction, Decreased knowledge of use of DME, Pain, Decreased range of motion, Decreased activity tolerance, Increased fascial restricitons  Visit Diagnosis: Abnormal posture  Cervicalgia  Other muscle spasm       G-Codes - 05/18/2017 1247    Functional Assessment Tool Used (Outpatient Only) FOTO   Functional Limitation Changing and maintaining body position   Changing and Maintaining Body Position Current Status (F6213) At least 40 percent but less than 60 percent impaired, limited or restricted  46%   Changing and Maintaining Body Position Goal Status (Y8657) At least 40 percent but less than 60 percent impaired, limited or restricted  44%      Problem List Patient Active Problem List   Diagnosis Date Noted  . Mitral valve insufficiency 03/19/2017  . Other spondylosis with radiculopathy, cervical region 03/14/2017  . Cerumen impaction 12/04/2016  . Generalized weakness 09/22/2015  .  Acidosis 09/22/2015  . Acute encephalopathy 09/22/2015  . Hyperglycemia 09/22/2015  . Leukopenia 09/22/2015  . CAP (community acquired pneumonia) 09/22/2015  . Bilateral leg edema 09/22/2015  . Constipation 09/22/2015  . Snoring 09/13/2015  . Glaucoma (increased eye pressure)   . Hypertension   . Neck pain   . Head pain   . Pain in joint, multiple sites   . Impaired glucose tolerance test   . Osteoarthrosis, unspecified whether generalized or localized, other specified sites   . Morbid obesity ([place])   . Encounter for long-term (current) use of other medications   . Other lymphedema   . Insomnia, unspecified   . Chronic kidney disease, stage III (moderate) (HCC)   . Primary open-angle glaucoma(365.11)     Voncille Lo, PT Certified Exercise Expert for the Aging Adult  2017-05-18 12:58 PM Phone: [phone] Fax: [place], [place], [address] Phone: [phone]   Fax:  [phone]  Name: MARYLYNN RIGDON MRN: [id number] Date of Birth: [birth year]   PHYSICAL THERAPY DISCHARGE SUMMARY  Visits from Start of Care: 3  Current functional level related to goals / functional outcomes: Unable to assess   Remaining deficits: Unknown  Education / Equipment: Initial posture and HEP Plan:                                                    Patient goals were partially  met. Patient is being discharged due to not returning since the last visit.  ?????    Pt cancelled appt due to weather and being out of town and has not returned in over a month  Voncille Lo, PT Certified Exercise Expert for the Aging Adult  06/05/17 11:59 AM Phone: [phone] Fax: [phone]

## 2017-05-17 ENCOUNTER — Ambulatory Visit: Payer: Medicare HMO | Admitting: Physical Therapy

## 2017-07-26 ENCOUNTER — Other Ambulatory Visit: Payer: Self-pay

## 2017-07-26 ENCOUNTER — Ambulatory Visit (INDEPENDENT_AMBULATORY_CARE_PROVIDER_SITE_OTHER): Payer: Medicare HMO | Admitting: Physician Assistant

## 2017-07-26 ENCOUNTER — Encounter (INDEPENDENT_AMBULATORY_CARE_PROVIDER_SITE_OTHER): Payer: Self-pay | Admitting: Physician Assistant

## 2017-07-26 ENCOUNTER — Other Ambulatory Visit: Payer: Self-pay | Admitting: Otolaryngology

## 2017-07-26 VITALS — BP 164/72 | HR 55 | Temp 97.6°F | Wt 146.4 lb

## 2017-07-26 DIAGNOSIS — H7191 Unspecified cholesteatoma, right ear: Secondary | ICD-10-CM

## 2017-07-26 DIAGNOSIS — Z658 Other specified problems related to psychosocial circumstances: Secondary | ICD-10-CM

## 2017-07-26 DIAGNOSIS — I10 Essential (primary) hypertension: Secondary | ICD-10-CM | POA: Diagnosis not present

## 2017-07-26 NOTE — Patient Instructions (Signed)
Community Resources  Advocacy/Legal Legal Aid [place]:  1-866-219-5262  /  336-272-0148  Family Justice Center:  336-641-7233  [facility] [place] 24-hr Crisis line:  336-273-7273  Women's Resource Center, [place]:  336-275-6090  Court Watch (custody):  336-275-2346  [place] [facility]:   336-279-9299    Baby & Breastfeeding Car Seat Inspection @ Various [place] Fire Depts.- call 336-373-2177  [place] Lactation  336-832-6860  [facility] 336-878-6712  WIC: 336-641-3663 ([place]);  336-641-7571 ([place])  La Leche League:  1-877-452-5321   Childcare [place] [facility]: 336-369-5097 ([place]) / 336-887-8224 ([place])  - Child Care Resources/ Referrals/ Scholarships  - Head Start/ Early Head Start (call or apply online)  [place] DHHS: [place] Pre-K :  1-800-859-0829 / 336-274-5437   Employment / Job Search [facility] [place]: 336-275-6090 / [address]  [employer] [employer] (JobLink): 336-373-5922 ([place]) / 336-882-4141 ([place])  [facility] Community Resource/ Career Center: 336-275-9801 / 336-282-7307  [place] [facility] Job & Career Center: 336-373-3764  DHHS Work First: 336-641-3447 ([place]) / 336-641-3447 ([place])  [facility] [place]:  336-676-5871   Financial Assistance [place] [facility]:  336-553-2657  [employer]: 336-235-0368  [employer] (furniture):  336-370-4002  [place] [facility]: 336-373-4264  Low Income Energy Assistance  336-641-3000   Food Assistance DHHS- SNAP/ Food Stamps: 336-641-4588  WIC: [place]- 336-641-3663 ;  [place] 336-641-7571  Little Green Book- Free Meals  Little Blue Book- Free Food Pantries  During the summer, text "FOOD" to 877877   General Health / Clinics (Adults) Orange Card (for Adults) through [place] [facility]: (336) 895-4900  [facility]:   336-832-8035  [facility]:   336-832-4444  Health Department:  336-641-3245  [facility]:  336-415-3877 / 336-641-2100  Planned [facility] of [place]:   336-373-0678  [facility]:   336-334-4822 x 50251   Housing [place] Housing Coalition:   336-691-9521  [place] [facility] Authority:  336-275-8501  Affordable Housing Managemnt:  336-273-0568   Immigrant/ Refugee Center for [place]ians ([employer]):  336-256-1065  Fa[facility] [employer] [facility]:  336-379-0037  [employer]:  336-937-4701  [employer] Services:  336-617-0381  African Services Coalition:  336-574-2677   LGBTQ YouthSAFE  www.youthsafe[place]so.org  PFLAG  336-541-6754 / info@[place].org  The Trevor Project:  1-866-488-7386   Mental Health/ Substance Use [facility] [place]  336-387-6161  [facility]:  336-832-9700 or 1-800-711-2635  [place] Circle of Care:  336-271-5888  [facility]:  336-294-1349  [facility] Care Services:  336-542-2884  [facility] (walk-ins)  336-676-6840 / [address]  [place]:  800-449-1287  Alcoholics Anonymous:  336-854-4278  Narcotics Anonymous:  800-365-1036  Quit Smoking Hotline:  800-QUIT-NOW (800-784-8669)   Parenting Children's Home Society:  800-632-1400  : Education Center & Support Groups:  336-832-6682  YWCA: 336-273-3461  [employer]: Bringing Out the Best:  336-334-3120               Thriving at Three (Hispanic families): 336-256-1066  Healthy Start ([facility] [place]):  336-387-6161 x2288  Parents as Teachers:  336-691-0024  [place] [facility]- Learning Together (Immigrants): 336-369-5001   Poison Control 800-222-1222  Sports & Recreation [employer] Open Doors Application: ymcanwnc.org/join/open-doors-financial-assistance/  City of [place] Recreation Centers: http://www.[place].gov/index.aspx?page=3615   Special Needs Family Support Network:  336-832-6507  Autism Society of [place]:   336-333-0197 x1402 or x1412 /  800-785-1035  [employer] [place]:  336-334-5773     ARC of [place]:  336-373-1076  Children's Developmental Service Agency (CDSA):  336-334-5601  CC4C (Care Coordination for Children):  336-641-7641   Transportation Medicaid Transportation: 336-641-4848 to apply  [place] Transit Authority: 336-335-6499 (reduced-fare bus ID to Medicaid/ Medicare/ Orange Card)  SCAT Paratransit services: Eligible riders only, call 336-333-6589 for application   Tutoring/Mentoring Black Child Development Institute: 336-230-2138  Big Brothers/ Big Sisters: 336-378-9100 ([place])  336-882-4167 ([place])  ACES through child's school: 336-370-2321  [employer] Achievers: contact your local Y  SHIELD Mentor Program: 336-337-2771   

## 2017-07-26 NOTE — Progress Notes (Signed)
Subjective:  Patient ID: [name], female    DOB: Jan 05, 1930  Age: 83 y.o. MRN: [id number]  CC: HTN  HPI  [name] is a 83 y.o. female with multiple comorbidities presents on f/u on HTN. BP is noted to be elevated today. Patient attributes higher BP to increased levels of stress. Has "family stress" and was in a rush to get here. Taking medications as directed. Does not receive help and manages medications on her own. BP has been 585-277 systolic and 82U diastolic. Has some lower extremity edema which is much better than before. Using compression stockings regularly. Does not endorse CP, palpitations. PND, orthopnea, presyncope, syncope, HA, SOB, abdominal pain, f/c/n/v, rash, or GI/GU sxs.      Outpatient Medications Prior to Visit  Medication Sig Dispense Refill  . ACAI PO Take 1 capsule by mouth 2 (two) times daily.    Marland Kitchen amLODipine (NORVASC) 2.5 MG tablet Take 1 tablet (2.5 mg total) by mouth daily. 90 tablet 3  . aspirin EC 81 MG tablet Take 1 tablet (81 mg total) by mouth daily. 90 tablet 3  . brimonidine (ALPHAGAN) 0.15 % ophthalmic solution Place 1 drop into the left eye 2 (two) times daily.     . Cyanocobalamin (VITAMIN B-12 PO) Take 1 tablet by mouth daily.    . cyclobenzaprine (FLEXERIL) 5 MG tablet Take 0.5 tablets (2.5 mg total) by mouth every 12 (twelve) hours as needed for muscle spasms. 10 tablet 0  . cyclobenzaprine (FLEXERIL) 5 MG tablet Take 0.5 tablets (2.5 mg total) by mouth every 12 (twelve) hours as needed for muscle spasms. 10 tablet 0  . docusate sodium (COLACE) 50 MG capsule Take 1 capsule (50 mg total) by mouth 2 (two) times daily. 10 capsule 0  . GARLIC PO Take 2,353 mg by mouth daily.     Marland Kitchen losartan-hydrochlorothiazide (HYZAAR) 100-25 MG tablet Take 1 tablet by mouth daily. 90 tablet 3  . Multiple Vitamin (MULTIVITAMIN WITH MINERALS) TABS Take 1 tablet by mouth daily.    Vladimir Faster Glycol-Propyl Glycol 0.4-0.3 % SOLN Place 1 drop into both eyes 3  (three) times daily as needed (for lubricating eyes).    . prednisoLONE acetate (PRED FORTE) 1 % ophthalmic suspension Place 1 drop into the left eye daily.     Marland Kitchen spironolactone (ALDACTONE) 25 MG tablet Take 1 tablet (25 mg total) by mouth daily. 90 tablet 3   No facility-administered medications prior to visit.      ROS Review of Systems  Constitutional: Negative for chills, fever and malaise/fatigue.  Eyes: Negative for blurred vision.  Respiratory: Negative for shortness of breath.   Cardiovascular: Negative for chest pain and palpitations.  Gastrointestinal: Negative for abdominal pain and nausea.  Genitourinary: Negative for dysuria and hematuria.  Musculoskeletal: Negative for joint pain and myalgias.  Skin: Negative for rash.  Neurological: Negative for tingling and headaches.  Psychiatric/Behavioral: Negative for depression. The patient is not nervous/anxious.     Objective:   Vitals:   07/26/17 1050 07/26/17 1119  BP: (!) 182/73 (!) 164/72  Pulse: (!) 55   Temp: 97.6 F (36.4 C)   SpO2: 99%      Physical Exam  Constitutional: She is oriented to person, place, and time.  Elderly, frail, NAD, polite  HENT:  Head: Normocephalic and atraumatic.  Eyes: No scleral icterus.  Arcus senilis, left sided cataract.  Cardiovascular: Normal rate, regular rhythm and normal heart sounds.  Pulmonary/Chest: Effort normal and breath sounds  normal.  Musculoskeletal: She exhibits no edema.  Neurological: She is alert and oriented to person, place, and time. No cranial nerve deficit. Coordination normal.  Skin: Skin is warm and dry. No rash noted. No erythema. No pallor.  Psychiatric: Her behavior is normal. Thought content normal.  Tearful at times when recounting lack of support from her family  Vitals reviewed.    Assessment & Plan:    1. Hypertension, unspecified type - Continue Spironolactone, Hyzaar, and Amlodipine  2. Inadequate social support - I have sent  message to Christa See LCSW to reach out to patient.  - I have printed a list of community resources that patient can contact to help with her transportation, housing, and social difficulties.     Follow-up: Return in about 8 weeks (around 09/20/2017).   Clent Demark PA

## 2017-07-30 ENCOUNTER — Ambulatory Visit
Admission: RE | Admit: 2017-07-30 | Discharge: 2017-07-30 | Disposition: A | Discharge status: Home / Self Care | Payer: Medicare HMO | Source: Ambulatory Visit | Attending: Otolaryngology | Admitting: Otolaryngology

## 2017-07-30 DIAGNOSIS — H7191 Unspecified cholesteatoma, right ear: Secondary | ICD-10-CM

## 2017-08-02 ENCOUNTER — Telehealth: Payer: Self-pay | Admitting: Licensed Clinical Social Worker

## 2017-08-02 NOTE — Telephone Encounter (Signed)
Call placed to pt in attempt to follow up on psychosocial needs per PCP.   Pt did not pick up and there was no voicemail set up.

## 2017-08-03 ENCOUNTER — Telehealth: Payer: Self-pay | Admitting: Licensed Clinical Social Worker

## 2017-08-03 NOTE — Telephone Encounter (Signed)
Call placed to pt in attempt to follow up on psychosocial needs per PCP.   Pt did not pick up and there was no voicemail set up.

## 2017-09-20 ENCOUNTER — Encounter (INDEPENDENT_AMBULATORY_CARE_PROVIDER_SITE_OTHER): Payer: Self-pay | Admitting: Physician Assistant

## 2017-09-20 ENCOUNTER — Ambulatory Visit (INDEPENDENT_AMBULATORY_CARE_PROVIDER_SITE_OTHER): Payer: Medicare HMO | Admitting: Physician Assistant

## 2017-09-20 VITALS — BP 138/82 | HR 67 | Temp 97.5°F | Resp 18 | Ht <= 58 in | Wt 152.0 lb

## 2017-09-20 DIAGNOSIS — Z658 Other specified problems related to psychosocial circumstances: Secondary | ICD-10-CM

## 2017-09-20 DIAGNOSIS — R600 Localized edema: Secondary | ICD-10-CM

## 2017-09-20 DIAGNOSIS — G4700 Insomnia, unspecified: Secondary | ICD-10-CM

## 2017-09-20 DIAGNOSIS — I10 Essential (primary) hypertension: Secondary | ICD-10-CM

## 2017-09-20 MED ORDER — MELATONIN 5 MG PO TABS: 1.0000 | ORAL_TABLET | Freq: Every day | ORAL | 3 refills | Status: DC

## 2017-09-20 MED ORDER — FUROSEMIDE 20 MG PO TABS
20.0000 mg | ORAL_TABLET | Freq: Every day | ORAL | 5 refills | Status: DC
Start: 2017-09-20 — End: 2017-11-19

## 2017-09-20 NOTE — Patient Instructions (Signed)

## 2017-09-20 NOTE — Progress Notes (Signed)
Subjective:  Patient ID: [name], female    DOB: 1930-05-23  Age: 84 y.o. MRN: [id number]  CC: HTN f/u  HPI [name] is a 84 y.o. female with a medical history of HTN, CKD3, Insomnia, Glaucoma, OA, and CAP presents with concern for weight gain. Has noted increased fluid retention of the lower extremities. Using compression stockings as directed. She is taking Spironolactone, Hyzaar, and Amlodipine as directed. Does not endorse CP, palpitations. PND, orthopnea, presyncope, syncope, HA, SOB, abdominal pain, f/c/n/v, rash, or GI/GU sxs.     Patient continues with lack of family support and housing issues. She is currently housed but has to move because the apartment belongs to the government and has been leased to her daughter. Patient is not authorized to live in the apartment. Patient was referred to [facility] LCSW but has not received a call from her. Says daughter is trying to work with the [employer] to have pt authorized to live in the apartment.     Patient is having difficulty maintaining sleep. Reports 4-5 hours of sleep. Takes unwanted naps during the day. 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     Outpatient Medications Prior to Visit  Medication Sig Dispense Refill  . amLODipine (NORVASC) 2.5 MG tablet Take 1 tablet (2.5 mg total) by mouth daily. 90 tablet 3  . aspirin EC 81 MG tablet Take 1 tablet (81 mg total) by mouth daily. 90 tablet 3  . brimonidine (ALPHAGAN) 0.15 % ophthalmic solution Place 1 drop into the left eye 2 (two) times daily.     . Cyanocobalamin (VITAMIN B-12 PO) Take 1 tablet by mouth daily.    Marland Kitchen docusate sodium (COLACE) 50 MG capsule Take 1 capsule (50 mg total) by mouth 2 (two) times daily. 10 capsule 0  . GARLIC PO Take 8,242 mg by mouth daily.     Marland Kitchen losartan-hydrochlorothiazide (HYZAAR) 100-25 MG tablet Take 1 tablet by mouth daily. 90 tablet 3  . Multiple Vitamin (MULTIVITAMIN WITH MINERALS) TABS Take 1 tablet  by mouth daily.    Vladimir Faster Glycol-Propyl Glycol 0.4-0.3 % SOLN Place 1 drop into both eyes 3 (three) times daily as needed (for lubricating eyes).    . prednisoLONE acetate (PRED FORTE) 1 % ophthalmic suspension Place 1 drop into the left eye daily.     Marland Kitchen spironolactone (ALDACTONE) 25 MG tablet Take 1 tablet (25 mg total) by mouth daily. 90 tablet 3  . ACAI PO Take 1 capsule by mouth 2 (two) times daily.    . cyclobenzaprine (FLEXERIL) 5 MG tablet Take 0.5 tablets (2.5 mg total) by mouth every 12 (twelve) hours as needed for muscle spasms. (Patient not taking: Reported on 09/20/2017) 10 tablet 0   No facility-administered medications prior to visit.      ROS Review of Systems  Constitutional: Negative for chills, fever and malaise/fatigue.  Eyes: Negative for blurred vision.  Respiratory: Negative for shortness of breath.   Cardiovascular: Positive for leg swelling. Negative for chest pain and palpitations.  Gastrointestinal: Negative for abdominal pain and nausea.  Genitourinary: Negative for dysuria and hematuria.  Musculoskeletal: Negative for joint pain and myalgias.  Skin: Negative for rash.  Neurological: Negative for tingling and headaches.  Psychiatric/Behavioral: Negative for depression. The patient is not nervous/anxious.     Objective:  BP 138/82 (BP Location: Left Arm, Patient Position: Sitting, Cuff Size: Normal)   Pulse 67   Temp (!) 97.5 F (36.4 C) (  Oral)   Resp 18   Ht 4\' 10"  (1.473 m)   Wt 152 lb (68.9 kg)   SpO2 99%   BMI 31.77 kg/m   BP/Weight 09/20/2017 07/26/2017 2/45/8099  Systolic BP 833 825 053  Diastolic BP 82 72 59  Wt. (Lbs) 152 146.4 -  BMI 31.77 30.6 -      Physical Exam  Constitutional: She is oriented to person, place, and time.  Elderly, frail, talkative, NAD, polite  HENT:  Head: Normocephalic and atraumatic.  Eyes: No scleral icterus.  Neck: Normal range of motion. Neck supple. No thyromegaly present.  Cardiovascular: Normal  rate and regular rhythm.  Wearing compression stockings. 2+ pitting edema  Pulmonary/Chest: Effort normal and breath sounds normal. No respiratory distress.  Neurological: She is alert and oriented to person, place, and time.  Skin: Skin is warm and dry. No rash noted. No erythema. No pallor.  Psychiatric: She has a normal mood and affect. Her behavior is normal. Thought content normal.  Vitals reviewed.    Assessment & Plan:    1. Hypertension, unspecified type - Begin furosemide (LASIX) 20 MG tablet; Take 1 tablet (20 mg total) by mouth daily.  Dispense: 30 tablet; Refill: 5 - Basic Metabolic Panel  2. Lower extremity edema - furosemide (LASIX) 20 MG tablet; Take 1 tablet (20 mg total) by mouth daily.  Dispense: 30 tablet; Refill: 5 - Basic Metabolic Panel  3. Insomnia, unspecified type - Melatonin 5 MG TABS; Take 1 tablet (5 mg total) by mouth at bedtime.  Dispense: 30 tablet; Refill: 3  4. Inadequate social support - Contacted Christa See LCSW to reach out to patient for assistance with housing issue.   Meds ordered this encounter  Medications  . furosemide (LASIX) 20 MG tablet    Sig: Take 1 tablet (20 mg total) by mouth daily.    Dispense:  30 tablet    Refill:  5    Order Specific Question:   Supervising Provider    Answer:   Tresa Garter [id number]  . Melatonin 5 MG TABS    Sig: Take 1 tablet (5 mg total) by mouth at bedtime.    Dispense:  30 tablet    Refill:  3    Order Specific Question:   Supervising Provider    Answer:   Tresa Garter [id number]    Follow-up: Return in about 8 weeks (around 11/15/2017) for LE edema.   Clent Demark PA

## 2017-09-21 ENCOUNTER — Telehealth: Payer: Self-pay | Admitting: Licensed Clinical Social Worker

## 2017-09-21 NOTE — Telephone Encounter (Signed)
LCSWA introduced self and explained role at [facility]. LCSWA disclosed that she received a consult from PA-C Altamease Oiler to provide housing resources.   Pt shared that she is currently on the bus and unable to talk privately. She agreed to speak with LCSWA on Monday, February 25, 19. No additional concerns noted.

## 2017-09-24 ENCOUNTER — Telehealth: Payer: Self-pay | Admitting: Licensed Clinical Social Worker

## 2017-09-24 NOTE — Telephone Encounter (Signed)
LCSWA received an incoming call from patient. She agreed to scheduled appointment for March 5, 19 to discuss housing resources.

## 2017-10-02 ENCOUNTER — Ambulatory Visit: Payer: Medicare HMO | Attending: Physician Assistant | Admitting: Licensed Clinical Social Worker

## 2017-10-02 DIAGNOSIS — Z599 Problem related to housing and economic circumstances, unspecified: Secondary | ICD-10-CM

## 2017-10-02 NOTE — BH Specialist Note (Signed)
Integrated Behavioral Health Initial Visit  MRN: [id number] Name: [name]  Number of [place] Clinician visits:: 1/6 Session Start time: 9:50 AM  Session End time: 10:50 AM Total time: 1 hour  Type of Service: [place] Interpretor:No. Interpretor Name and Language: N/A   Warm Hand Off Completed.       SUBJECTIVE: [name] is a 84 y.o. female accompanied by self Patient was referred by Lynnell Grain for housing resources. Patient reports the following symptoms/concerns: Pt shared that she is unable to reside at her residence due to the condition of the home. Currently, she is residing with adult daughter who resides in [place]. Pt is not authorized to reside with daughter long-term resulting in an increase of anxiety Duration of problem: Couple of months; Severity of problem: mild  OBJECTIVE: Mood: Pleasant and Affect: Appropriate Risk of harm to self or others: No plan to harm self or others  LIFE CONTEXT: Family and Social: Pt receives support from adult children, grandchildren, and friends. She is involved in a [employer] and community programs that support those visually impaired School/Work: Pt receives social security [phone]) Self-Care: Pt is active in her community. She enjoys shopping and attending support group for the vision impaired.  Life Changes: Pt is residing with her adult daughter; however, she wishes to return to residence. Pt shared that the home is not in great condition and needs assistance cleaning the home  GOALS ADDRESSED: Patient will: 1. Reduce symptoms of: stress 2. Increase knowledge and/or ability of: coping skills  3. Demonstrate ability to: Increase adequate support systems for patient/family  INTERVENTIONS: Interventions utilized: Solution-Focused Strategies, Supportive Counseling, Psychoeducation and/or Health Education and Link to [employer]  Standardized  Assessments completed: Not Needed  ASSESSMENT: Patient shared that she is unable to reside at her residence due to the condition of the home. Currently, she is residing with adult daughter who resides in [place]. Pt is not authorized to reside with daughter long-term resulting in an increase of anxiety. She receives minimal support from adult children and grandchildren. Pt is active in her local church, support groups, and with life-long friends.    Valinda educated pt on how stress can negatively impact one's mental and physical health. Therapeutic interventions were discussed to assist in stress management. Pt is interested in participating in [place] and a 10 day camp through her church. Pt reports increase in anxiety due to needing to find a sponsor to assist with the financial responsibility of the camps.   Pt has contacted [employer] to assess residence for mold and lead. If found, [facility] will provide assistance cleaning the home. Pt is agreeable to scheduling appointment with agency by next week.   PLAN: 1. Follow up with behavioral health clinician on : Pt was encouraged to contact [facility] if symptoms worsen or fail to improve to schedule behavioral appointments at [facility]. 2. Behavioral recommendations: LCSWA recommends that pt apply healthy coping skills discussed and schedule appointment with [facility]. Pt is encouraged to schedule follow up appointment with LCSWA 3. Referral(s): [employer] Resources:  Housing 4. "From scale of 1-10, how likely are you to follow plan?": 10/10  Rebekah Chesterfield, LCSW 10/02/17 3:48 PM

## 2017-10-03 LAB — BASIC METABOLIC PANEL
BUN/Creatinine Ratio: 26 (ref 12–28)
BUN: 32 mg/dL — ABNORMAL HIGH (ref 8–27)
CALCIUM: 9.9 mg/dL (ref 8.7–10.3)
CO2: 28 mmol/L (ref 20–29)
Chloride: 101 mmol/L (ref 96–106)
Creatinine, Ser: 1.25 mg/dL — ABNORMAL HIGH (ref 0.57–1.00)
GFR calc non Af Amer: 39 mL/min/{1.73_m2} — ABNORMAL LOW (ref >59)
GFR, EST AFRICAN AMERICAN: 45 mL/min/{1.73_m2} — AB (ref >59)
Glucose: 92 mg/dL (ref 65–99)
Potassium: 4.4 mmol/L (ref 3.5–5.2)
Sodium: 143 mmol/L (ref 134–144)

## 2017-10-04 ENCOUNTER — Telehealth (INDEPENDENT_AMBULATORY_CARE_PROVIDER_SITE_OTHER): Payer: Self-pay

## 2017-10-04 NOTE — Telephone Encounter (Signed)
-----   Message from Clent Demark, PA-C sent at 10/03/2017  5:58 PM EST ----- Stable CKD. Keep BP controlled.

## 2017-10-04 NOTE — Telephone Encounter (Signed)
Patient is aware of CKD being stable and to control BP. [name], CMA

## 2017-10-17 ENCOUNTER — Telehealth (INDEPENDENT_AMBULATORY_CARE_PROVIDER_SITE_OTHER): Payer: Self-pay | Admitting: Physician Assistant

## 2017-10-17 NOTE — Telephone Encounter (Signed)
Patient is concern because she is taking furosemide (LASIX) 20 MG  Said that can interfere with vision. Please, call  her  Thank you

## 2017-10-17 NOTE — Telephone Encounter (Signed)
Patient aware. But states she was not aware that she had CHF, will discuss with provider at next office visit. [name], CMA

## 2017-10-17 NOTE — Telephone Encounter (Signed)
FWD to PCP. Tempestt S Roberts, CMA  

## 2017-10-17 NOTE — Telephone Encounter (Signed)
Furosemide can cause visual blurring and yellowing of visual field. However, the benefit to risk has to be considered. It is more beneficial to use furosemide to treat her congestive heart failure than not. Untreated CHF can lead to death.

## 2017-11-19 ENCOUNTER — Other Ambulatory Visit: Payer: Self-pay

## 2017-11-19 ENCOUNTER — Encounter (INDEPENDENT_AMBULATORY_CARE_PROVIDER_SITE_OTHER): Payer: Self-pay | Admitting: Physician Assistant

## 2017-11-19 ENCOUNTER — Ambulatory Visit (INDEPENDENT_AMBULATORY_CARE_PROVIDER_SITE_OTHER): Payer: Medicare HMO | Admitting: Physician Assistant

## 2017-11-19 VITALS — BP 131/71 | HR 59 | Temp 97.3°F | Ht <= 58 in | Wt 146.4 lb

## 2017-11-19 DIAGNOSIS — H6121 Impacted cerumen, right ear: Secondary | ICD-10-CM

## 2017-11-19 DIAGNOSIS — R600 Localized edema: Secondary | ICD-10-CM

## 2017-11-19 DIAGNOSIS — H7291 Unspecified perforation of tympanic membrane, right ear: Secondary | ICD-10-CM | POA: Diagnosis not present

## 2017-11-19 DIAGNOSIS — I10 Essential (primary) hypertension: Secondary | ICD-10-CM

## 2017-11-19 DIAGNOSIS — Z79899 Other long term (current) drug therapy: Secondary | ICD-10-CM | POA: Diagnosis not present

## 2017-11-19 MED ORDER — AMLODIPINE BESYLATE 2.5 MG PO TABS
2.5000 mg | ORAL_TABLET | Freq: Every day | ORAL | 3 refills | Status: DC
Start: 2017-11-19 — End: 2018-10-14

## 2017-11-19 MED ORDER — FUROSEMIDE 20 MG PO TABS: 20.0000 mg | ORAL_TABLET | Freq: Every day | ORAL | 5 refills | Status: DC

## 2017-11-19 MED ORDER — LOSARTAN POTASSIUM-HCTZ 100-25 MG PO TABS: 1.0000 | ORAL_TABLET | Freq: Every day | ORAL | 3 refills | Status: DC

## 2017-11-19 MED ORDER — SPIRONOLACTONE 25 MG PO TABS: 25.0000 mg | ORAL_TABLET | Freq: Every day | ORAL | 3 refills | Status: DC

## 2017-11-19 NOTE — Patient Instructions (Signed)
You may try Cetaphil for skin dryness.  Eardrum Perforation The eardrum is a thin, round tissue inside the ear. It allows you to hear. The eardrum can get torn (perforated). Eardrums often heal on their own. There is often little or no long-term hearing loss. Follow these instructions at home:  Keep your ear dry while it heals. Do not let your head go under water. Do not swim or dive until your doctor says it is okay.  Before you take a bath or shower, do one of these things to keep water out of your ear: ? Put a waterproof earplug in your ear. ? Put petroleum jelly all over a cotton ball. Put the cotton ball in your ear.  Take medicines only as told by your doctor.  Avoid blowing your nose if you can. If you blow your nose, do it gently.  Continue your normal activities after your eardrum heals. Your doctor will tell you when your eardrum has healed.  Talk to your doctor before you fly on an airplane.  Keep all doctor follow-up visits as told by your doctor. This is important. Contact a doctor if:  You have a fever. Get help right away if:  You have blood or yellowish-white fluid (pus) coming from your ear.  You feel dizzy or off balance.  You feel sick to your stomach (nauseous), or you throw up (vomit).  You have more pain. This information is not intended to replace advice given to you by your health care provider. Make sure you discuss any questions you have with your health care provider. Document Released: 01/04/2010 Document Revised: 12/23/2015 Document Reviewed: 02/23/2014 Elsevier Interactive Patient Education  [employer].

## 2017-11-19 NOTE — Progress Notes (Signed)
Subjective:  Patient ID: [name], female    DOB: [birth year]  Age: 84 y.o. MRN: [id number]  CC: LE edema  HPI NAVEEN LORUSSO is a 84 y.o. female with a medical history of HTN, CKD3, Insomnia, Glaucoma, OA, and CAP presents on f/u of bilateral LE edema. She reports LE edema is much better than previous visit. Taking Lasix as directed and wearing compression stockings.  Does not endorse CP, palpitations. PND, orthopnea, presyncope, syncope, HA, SOB, abdominal pain, f/c/n/v, rash, or GI/GU sxs.    Outpatient Medications Prior to Visit  Medication Sig Dispense Refill  . ACAI PO Take 1 capsule by mouth 2 (two) times daily.    Marland Kitchen amLODipine (NORVASC) 2.5 MG tablet Take 1 tablet (2.5 mg total) by mouth daily. 90 tablet 3  . aspirin EC 81 MG tablet Take 1 tablet (81 mg total) by mouth daily. 90 tablet 3  . brimonidine (ALPHAGAN) 0.15 % ophthalmic solution Place 1 drop into the left eye 2 (two) times daily.     . Cyanocobalamin (VITAMIN B-12 PO) Take 1 tablet by mouth daily.    Marland Kitchen docusate sodium (COLACE) 50 MG capsule Take 1 capsule (50 mg total) by mouth 2 (two) times daily. 10 capsule 0  . furosemide (LASIX) 20 MG tablet Take 1 tablet (20 mg total) by mouth daily. 30 tablet 5  . GARLIC PO Take 1,761 mg by mouth daily.     Marland Kitchen losartan-hydrochlorothiazide (HYZAAR) 100-25 MG tablet Take 1 tablet by mouth daily. 90 tablet 3  . Multiple Vitamin (MULTIVITAMIN WITH MINERALS) TABS Take 1 tablet by mouth daily.    Vladimir Faster Glycol-Propyl Glycol 0.4-0.3 % SOLN Place 1 drop into both eyes 3 (three) times daily as needed (for lubricating eyes).    . prednisoLONE acetate (PRED FORTE) 1 % ophthalmic suspension Place 1 drop into the left eye daily.     Marland Kitchen spironolactone (ALDACTONE) 25 MG tablet Take 1 tablet (25 mg total) by mouth daily. 90 tablet 3  . Melatonin 5 MG TABS Take 1 tablet (5 mg total) by mouth at bedtime. (Patient not taking: Reported on 11/19/2017) 30 tablet 3   No facility-administered  medications prior to visit.      ROS Review of Systems  Constitutional: Negative for chills, fever and malaise/fatigue.  HENT: Positive for ear pain.        Impaired hearing on right side.  Eyes: Negative for blurred vision.  Respiratory: Negative for shortness of breath.   Cardiovascular: Negative for chest pain and palpitations.  Gastrointestinal: Negative for abdominal pain and nausea.  Genitourinary: Negative for dysuria and hematuria.  Musculoskeletal: Negative for joint pain and myalgias.  Skin: Negative for rash.  Neurological: Negative for tingling and headaches.  Psychiatric/Behavioral: Negative for depression. The patient is not nervous/anxious.     Objective:  BP 131/71 (BP Location: Left Arm, Patient Position: Sitting, Cuff Size: Normal)   Pulse (!) 59   Temp (!) 97.3 F (36.3 C) (Oral)   Ht 4\' 10"  (1.473 m)   Wt 146 lb 6.4 oz (66.4 kg)   SpO2 99%   BMI 30.60 kg/m   BP/Weight 11/19/2017 09/20/2017 60/73/7106  Systolic BP 269 485 462  Diastolic BP 71 82 72  Wt. (Lbs) 146.4 152 146.4  BMI 30.6 31.77 30.6      Physical Exam  Constitutional: She is oriented to person, place, and time.  Well developed, well nourished, NAD, polite  HENT:  Head: Normocephalic and atraumatic.  Mildly hard  of hearing to conversational tones on right side. Small perforation of the right TM.  Eyes: No scleral icterus.  Neck: Normal range of motion. Neck supple. No thyromegaly present.  Cardiovascular: Normal rate, regular rhythm and normal heart sounds.  Trace pitting edema of lower extremity bilaterally   Pulmonary/Chest: Effort normal and breath sounds normal.  Neurological: She is alert and oriented to person, place, and time.  Skin: Skin is warm and dry. No rash noted. No erythema. No pallor.  Psychiatric: She has a normal mood and affect. Her behavior is normal. Thought content normal.  Vitals reviewed.    Assessment & Plan:     1. Lower extremity edema - Refill  furosemide (LASIX) 20 MG tablet; Take 1 tablet (20 mg total) by mouth daily.  Dispense: 30 tablet; Refill: 5 - Basic Metabolic Panel  2. Impacted cerumen of right ear - Removed cerumen with curette in clinic today.  3. Perforation of right tympanic membrane - I have advised patient that she is healing well and her perforation is smaller than previous visits. Alternatively, she may go back to her ENT for second opinion.  4. Hypertension, unspecified type - losartan-hydrochlorothiazide (HYZAAR) 100-25 MG tablet; Take 1 tablet by mouth daily.  Dispense: 90 tablet; Refill: 3 - spironolactone (ALDACTONE) 25 MG tablet; Take 1 tablet (25 mg total) by mouth daily.  Dispense: 90 tablet; Refill: 3 - furosemide (LASIX) 20 MG tablet; Take 1 tablet (20 mg total) by mouth daily.  Dispense: 30 tablet; Refill: 5 - amLODipine (NORVASC) 2.5 MG tablet; Take 1 tablet (2.5 mg total) by mouth daily.  Dispense: 90 tablet; Refill: 3 - Basic Metabolic Panel  5. High risk medication [place] - Basic Metabolic Panel   Meds ordered this encounter  Medications  . losartan-hydrochlorothiazide (HYZAAR) 100-25 MG tablet    Sig: Take 1 tablet by mouth daily.    Dispense:  90 tablet    Refill:  3    Order Specific Question:   Supervising Provider    Answer:   Tresa Garter [id number]  . spironolactone (ALDACTONE) 25 MG tablet    Sig: Take 1 tablet (25 mg total) by mouth daily.    Dispense:  90 tablet    Refill:  3    Order Specific Question:   Supervising Provider    Answer:   Tresa Garter [id number]  . furosemide (LASIX) 20 MG tablet    Sig: Take 1 tablet (20 mg total) by mouth daily.    Dispense:  30 tablet    Refill:  5    Order Specific Question:   Supervising Provider    Answer:   Tresa Garter [id number]  . amLODipine (NORVASC) 2.5 MG tablet    Sig: Take 1 tablet (2.5 mg total) by mouth daily.    Dispense:  90 tablet    Refill:  3    Order Specific Question:   Supervising Provider     Answer:   Tresa Garter [id number]    Follow-up: Return in about 2 months (around 01/19/2018) for HTN and potassium check.   Clent Demark PA

## 2017-11-22 ENCOUNTER — Other Ambulatory Visit (INDEPENDENT_AMBULATORY_CARE_PROVIDER_SITE_OTHER): Payer: Self-pay | Admitting: Physician Assistant

## 2017-11-23 LAB — BASIC METABOLIC PANEL
BUN / CREAT RATIO: 30 — AB (ref 12–28)
BUN: 45 mg/dL — AB (ref 8–27)
CHLORIDE: 105 mmol/L (ref 96–106)
CO2: 21 mmol/L (ref 20–29)
Calcium: 9.7 mg/dL (ref 8.7–10.3)
Creatinine, Ser: 1.52 mg/dL — ABNORMAL HIGH (ref 0.57–1.00)
GFR calc non Af Amer: 31 mL/min/{1.73_m2} — ABNORMAL LOW (ref >59)
GFR, EST AFRICAN AMERICAN: 36 mL/min/{1.73_m2} — AB (ref >59)
Glucose: 90 mg/dL (ref 65–99)
Potassium: 4.3 mmol/L (ref 3.5–5.2)
SODIUM: 141 mmol/L (ref 134–144)

## 2017-11-27 ENCOUNTER — Telehealth (INDEPENDENT_AMBULATORY_CARE_PROVIDER_SITE_OTHER): Payer: Self-pay

## 2017-11-27 NOTE — Telephone Encounter (Signed)
Patient aware of elevated serum creatinine and has scheduled to come in for evaluation on May 23 at 8:50 am. [name], CMA

## 2017-11-27 NOTE — Telephone Encounter (Signed)
-----   Message from Clent Demark, PA-C sent at 11/26/2017  5:02 PM EDT ----- Pt's serum creatinine is higher than before. Please have her return within 1-2 weeks for evaluation.

## 2017-11-28 ENCOUNTER — Telehealth (INDEPENDENT_AMBULATORY_CARE_PROVIDER_SITE_OTHER): Payer: Self-pay | Admitting: Physician Assistant

## 2017-11-28 NOTE — Telephone Encounter (Signed)
Patient would like a copy of her lab results she will be here @3pm  and also she wants to know how much water she is suppose to drink  Thank you

## 2017-11-28 NOTE — Telephone Encounter (Signed)
Letter printed and placed at front desk. Patient should drink 8 cups of water daily which is about 64 oz. [name], CMA

## 2017-12-20 ENCOUNTER — Ambulatory Visit (INDEPENDENT_AMBULATORY_CARE_PROVIDER_SITE_OTHER): Payer: Medicare HMO | Admitting: Nurse Practitioner

## 2017-12-20 ENCOUNTER — Other Ambulatory Visit: Payer: Self-pay

## 2017-12-20 ENCOUNTER — Encounter (INDEPENDENT_AMBULATORY_CARE_PROVIDER_SITE_OTHER): Payer: Self-pay | Admitting: Nurse Practitioner

## 2017-12-20 VITALS — BP 153/70 | HR 51 | Temp 97.3°F | Ht <= 58 in | Wt 147.2 lb

## 2017-12-20 DIAGNOSIS — R200 Anesthesia of skin: Secondary | ICD-10-CM

## 2017-12-20 DIAGNOSIS — I10 Essential (primary) hypertension: Secondary | ICD-10-CM

## 2017-12-20 DIAGNOSIS — R7989 Other specified abnormal findings of blood chemistry: Secondary | ICD-10-CM | POA: Diagnosis not present

## 2017-12-20 DIAGNOSIS — R202 Paresthesia of skin: Secondary | ICD-10-CM

## 2017-12-20 NOTE — Patient Instructions (Signed)

## 2017-12-20 NOTE — Progress Notes (Signed)
Assessment & Plan:  [name] was seen today for follow-up.  Diagnoses and all orders for this visit:  Elevated serum creatinine -     CMP14+EGFR; Future  Essential hypertension Continue all antihypertensives as prescribed.  Remember to bring in your blood pressure log with you for your follow up appointment.  DASH/Mediterranean Diets are healthier choices for HTN.  NO REFILLS NEEDED TODAY She was instructed to take her Lasix as prescribed.  Numbness and tingling in right hand Unfortunately wean due to not having any wrist splints here in the office today.  I have instructed the patient to obtain a right hand wrist splint to see if this will help reduce the symptoms of tingling in her hand.   Patient has been counseled on age-appropriate routine health concerns for screening and prevention. These are reviewed and up-to-date. Referrals have been placed accordingly. Immunizations are up-to-date or declined.    Subjective:   Chief Complaint  Patient presents with  . Follow-up    elevated serum creatinine    HPI [name] 84 y.o. female presents to office today for follow up.  Essential Hypertension Chronic. She endorses medication compliance. Taking amlodipine 2.5 mg, furosemide 20 mg, Hyzaar 100-25 mg and spironolactone 25 mg Tran.  She has significant bilateral lower extremity edema.  On exam today her compression stockings are noted to be loosely fitted for her legs.  She has been instructed to return to her medical supply store and be fitted for a more custom fit.  She states she goes to a medical supply store in [place] and can get her compression stockings for around $8.Denies chest pain, shortness of breath, palpitations, lightheadedness, dizziness, or headaches.  BP Readings from Last 3 Encounters:  12/20/17 (!) 153/70  11/19/17 131/71  09/20/17 138/82     Elevated Creatinine Chronic history of CKD3.  She has been referred to a nephrologist last year for elevated  creatinine and decreased GFR.  Today she reports she was told by a provider at [facility] that additional follow up at that time was not required. Will repeat CMP with GFR today.  She denies any GU symptoms at this time including flank pain, hematuria or abdominal pain. She reports being a "hard stick" and having to referred to [place] to have her blood drawn. I will send the requisition for labwork with the patient today.  Lab Results  Component Value Date   CREATININE 1.52 (H) 11/22/2017    Carpal Tunnel Syndrome Patient presents for presents evaluation of hand paresthesias and possible carpal tunnel syndrome.  Onset of the symptoms was several months ago. Current symptoms include tingling involving the palmar aspect of the right hand and fingertips, of moderate, tolerable severity. Inciting event/aggravating factors: none known Patient's course of XI:PJAS controlled. Evaluation to date: none.  Treatment to date: none.   Review of Systems  Constitutional: Negative for fever, malaise/fatigue and weight loss.  HENT: Positive for ear pain. Negative for nosebleeds.   Eyes: Positive for blurred vision. Negative for double vision and photophobia.  Respiratory: Negative.  Negative for cough and shortness of breath.   Cardiovascular: Positive for leg swelling. Negative for chest pain and palpitations.  Gastrointestinal: Negative.  Negative for heartburn, nausea and vomiting.  Musculoskeletal: Negative.  Negative for myalgias.  Neurological: Positive for tingling and sensory change. Negative for dizziness, focal weakness, seizures and headaches.  Psychiatric/Behavioral: Negative.  Negative for suicidal ideas.    Past Medical History:  Diagnosis Date  . CAP (community  acquired pneumonia) 09/21/2015  . Chronic kidney disease, stage III (moderate) (HCC)   . Encounter for long-term (current) use of other medications   . Glaucoma (increased eye pressure)   . Head pain   . Hypertension    . Impaired glucose tolerance test   . Insomnia, unspecified   . Morbid obesity ([place])   . Neck pain   . Osteoarthrosis, unspecified whether generalized or localized, other specified sites   . Other lymphedema   . Pain in joint, multiple sites   . Primary open-angle glaucoma(365.11)     Past Surgical History:  Procedure Laterality Date  . ABDOMINAL HYSTERECTOMY    . EYE SURGERY      Family History  Problem Relation Age of Onset  . Cancer Father     Social History Reviewed with no changes to be made today.   Outpatient Medications Prior to Visit  Medication Sig Dispense Refill  . ACAI PO Take 1 capsule by mouth 2 (two) times Tran.    Marland Kitchen amLODipine (NORVASC) 2.5 MG tablet Take 1 tablet (2.5 mg total) by mouth Tran. 90 tablet 3  . aspirin EC 81 MG tablet Take 1 tablet (81 mg total) by mouth Tran. 90 tablet 3  . brimonidine (ALPHAGAN) 0.15 % ophthalmic solution Place 1 drop into the left eye 2 (two) times Tran.     . Cyanocobalamin (VITAMIN B-12 PO) Take 1 tablet by mouth Tran.    Marland Kitchen docusate sodium (COLACE) 50 MG capsule Take 1 capsule (50 mg total) by mouth 2 (two) times Tran. 10 capsule 0  . furosemide (LASIX) 20 MG tablet Take 1 tablet (20 mg total) by mouth Tran. 30 tablet 5  . GARLIC PO Take 8,469 mg by mouth Tran.     Marland Kitchen losartan-hydrochlorothiazide (HYZAAR) 100-25 MG tablet Take 1 tablet by mouth Tran. 90 tablet 3  . Multiple Vitamin (MULTIVITAMIN WITH MINERALS) TABS Take 1 tablet by mouth Tran.    Vladimir Faster Glycol-Propyl Glycol 0.4-0.3 % SOLN Place 1 drop into both eyes 3 (three) times Tran as needed (for lubricating eyes).    . prednisoLONE acetate (PRED FORTE) 1 % ophthalmic suspension Place 1 drop into the left eye Tran.     Marland Kitchen spironolactone (ALDACTONE) 25 MG tablet Take 1 tablet (25 mg total) by mouth Tran. 90 tablet 3  . Melatonin 5 MG TABS Take 1 tablet (5 mg total) by mouth at bedtime. (Patient not taking: Reported on 11/19/2017) 30 tablet 3   No  facility-administered medications prior to visit.     Allergies  Allergen Reactions  . Penicillins Hives    Has patient had a PCN reaction causing immediate rash, facial/tongue/throat swelling, SOB or lightheadedness with hypotension: No Has patient had a PCN reaction causing severe rash involving mucus membranes or skin necrosis: No Has patient had a PCN reaction that required hospitalization No Has patient had a PCN reaction occurring within the last 10 years: No If all of the above answers are "NO", then may proceed with Cephalosporin use.  . Tetanus Toxoid Hives       Objective:    BP (!) 153/70 (BP Location: Left Arm, Patient Position: Sitting, Cuff Size: Normal)   Pulse (!) 51   Temp (!) 97.3 F (36.3 C) (Oral)   Ht _0  (1.473 m)   Wt 147 lb 3.2 oz (66.8 kg)   SpO2 99%   BMI 30.76 kg/m  Wt Readings from Last 3 Encounters:  12/20/17 147 lb 3.2 oz (66.8 kg)  11/19/17 146 lb 6.4 oz (66.4 kg)  09/20/17 152 lb (68.9 kg)    Physical Exam  Constitutional: She is oriented to person, place, and time. She appears well-developed and well-nourished. She is cooperative.  HENT:  Head: Normocephalic and atraumatic.  Eyes: EOM are normal.  Neck: Normal range of motion.  Cardiovascular: Regular rhythm. Bradycardia present. Exam reveals no gallop and no friction rub.  Murmur heard. Pulmonary/Chest: Effort normal and breath sounds normal. No tachypnea. No respiratory distress. She has no decreased breath sounds. She has no wheezes. She has no rhonchi. She has no rales. She exhibits no tenderness.  Abdominal: Bowel sounds are normal.  Musculoskeletal: Normal range of motion. She exhibits edema. She exhibits no deformity.       Right hand: She exhibits normal range of motion, no tenderness, normal capillary refill, no deformity and no swelling. She exhibits no finger abduction, no thumb/finger opposition and no wrist extension trouble.  Significant 1+ pitting edema of the bilateral  lower extremities. Compression stockings are poorly fitted.   Neurological: She is alert and oriented to person, place, and time. Coordination normal.  Skin: Skin is warm and dry.  Psychiatric: She has a normal mood and affect. Her behavior is normal. Judgment and thought content normal.  Nursing note and vitals reviewed.      Patient has been counseled extensively about nutrition and exercise as well as the importance of adherence with medications and regular follow-up. The patient was given clear instructions to go to ER or return to medical center if symptoms don't improve, worsen or new problems develop. The patient verbalized understanding.   Follow-up: Return in about 2 months (around 02/19/2018) for HTN/EDEMA.   Gildardo Pounds, FNP-BC [facility] and [facility], [place]   12/20/2017, 9:43 AM

## 2017-12-22 LAB — CMP14+EGFR
A/G RATIO: 1.6 (ref 1.2–2.2)
ALT: 14 IU/L (ref 0–32)
AST: 22 IU/L (ref 0–40)
Albumin: 4.2 g/dL (ref 3.5–4.7)
Alkaline Phosphatase: 106 IU/L (ref 39–117)
BILIRUBIN TOTAL: 0.6 mg/dL (ref 0.0–1.2)
BUN/Creatinine Ratio: 22 (ref 12–28)
BUN: 32 mg/dL — ABNORMAL HIGH (ref 8–27)
CALCIUM: 9.8 mg/dL (ref 8.7–10.3)
CHLORIDE: 101 mmol/L (ref 96–106)
CO2: 27 mmol/L (ref 20–29)
Creatinine, Ser: 1.46 mg/dL — ABNORMAL HIGH (ref 0.57–1.00)
GFR calc Af Amer: 38 mL/min/{1.73_m2} — ABNORMAL LOW (ref >59)
GFR calc non Af Amer: 33 mL/min/{1.73_m2} — ABNORMAL LOW (ref >59)
GLUCOSE: 93 mg/dL (ref 65–99)
Globulin, Total: 2.7 g/dL (ref 1.5–4.5)
POTASSIUM: 5 mmol/L (ref 3.5–5.2)
Sodium: 141 mmol/L (ref 134–144)
Total Protein: 6.9 g/dL (ref 6.0–8.5)

## 2017-12-25 ENCOUNTER — Telehealth (INDEPENDENT_AMBULATORY_CARE_PROVIDER_SITE_OTHER): Payer: Self-pay

## 2017-12-25 NOTE — Telephone Encounter (Signed)
Patient has been informed that kidney levels have improved and we will continue to follow along. [name], CMA

## 2017-12-25 NOTE — Telephone Encounter (Signed)
-----   Message from Gildardo Pounds, NP sent at 12/24/2017  7:28 PM EDT ----- Kidney levels have improved. Will continue to follow along.

## 2018-02-18 ENCOUNTER — Other Ambulatory Visit: Payer: Self-pay

## 2018-02-18 ENCOUNTER — Ambulatory Visit (INDEPENDENT_AMBULATORY_CARE_PROVIDER_SITE_OTHER): Payer: Medicare HMO | Admitting: Physician Assistant

## 2018-02-18 ENCOUNTER — Encounter (INDEPENDENT_AMBULATORY_CARE_PROVIDER_SITE_OTHER): Payer: Self-pay | Admitting: Physician Assistant

## 2018-02-18 VITALS — BP 132/71 | HR 52 | Temp 97.6°F | Wt 147.8 lb

## 2018-02-18 DIAGNOSIS — R600 Localized edema: Secondary | ICD-10-CM

## 2018-02-18 DIAGNOSIS — R202 Paresthesia of skin: Secondary | ICD-10-CM

## 2018-02-18 DIAGNOSIS — Z029 Encounter for administrative examinations, unspecified: Secondary | ICD-10-CM

## 2018-02-18 DIAGNOSIS — Z23 Encounter for immunization: Secondary | ICD-10-CM | POA: Diagnosis not present

## 2018-02-18 DIAGNOSIS — E2839 Other primary ovarian failure: Secondary | ICD-10-CM

## 2018-02-18 DIAGNOSIS — I10 Essential (primary) hypertension: Secondary | ICD-10-CM

## 2018-02-18 DIAGNOSIS — H7291 Unspecified perforation of tympanic membrane, right ear: Secondary | ICD-10-CM

## 2018-02-18 MED ORDER — GABAPENTIN 100 MG PO CAPS: 100.0000 mg | ORAL_CAPSULE | Freq: Every day | ORAL | 5 refills | Status: DC

## 2018-02-18 MED ORDER — FUROSEMIDE 20 MG PO TABS: 20.0000 mg | ORAL_TABLET | Freq: Every day | ORAL | 2 refills | Status: DC

## 2018-02-18 NOTE — Patient Instructions (Signed)

## 2018-02-18 NOTE — Progress Notes (Signed)
Subjective:  Patient ID: [name], female    DOB: [birth year]  Age: 84 y.o. MRN: [id number]  CC: HTN and LE edema  HPI [name] a 84 y.o.femalewith a medical history of HTN, CKD3, Insomnia, Glaucoma, OA, and CAP presents on f/u of bilateral LE edema and HTN. Says she is feeling well in regards to LE edema and has a new pair of compression stockings. Reports regular visits with her cardiologist Dr. Terrence Dupont but no notes available for review. Says she was seen seven days ago and kept of the same medications. Endorses daily  tingling of the right hand and chronic neck pain. XR of the C spine on 03/14/17 revealed cervical spondylosis without anterolisthesis. Tingling of the right hand awakens her at night. Also continues to have right ear pain. Had a perforation diagnosed many months ago but has been healing on its own. Has already seen ENT on 02/26/17 which advised observation and keeping fluids out of her ear. Does not endorse any other symptoms or complaints.      Outpatient Medications Prior to Visit  Medication Sig Dispense Refill  . ACAI PO Take 1 capsule by mouth 2 (two) times daily.    Marland Kitchen amLODipine (NORVASC) 2.5 MG tablet Take 1 tablet (2.5 mg total) by mouth daily. 90 tablet 3  . aspirin EC 81 MG tablet Take 1 tablet (81 mg total) by mouth daily. 90 tablet 3  . brimonidine (ALPHAGAN) 0.15 % ophthalmic solution Place 1 drop into the left eye 2 (two) times daily.     . Cyanocobalamin (VITAMIN B-12 PO) Take 1 tablet by mouth daily.    Marland Kitchen docusate sodium (COLACE) 50 MG capsule Take 1 capsule (50 mg total) by mouth 2 (two) times daily. 10 capsule 0  . furosemide (LASIX) 20 MG tablet Take 1 tablet (20 mg total) by mouth daily. 30 tablet 5  . GARLIC PO Take 6,553 mg by mouth daily.     Marland Kitchen losartan-hydrochlorothiazide (HYZAAR) 100-25 MG tablet Take 1 tablet by mouth daily. 90 tablet 3  . Melatonin 5 MG TABS Take 1 tablet (5 mg total) by mouth at bedtime. 30 tablet 3  . Multiple  Vitamin (MULTIVITAMIN WITH MINERALS) TABS Take 1 tablet by mouth daily.    Vladimir Faster Glycol-Propyl Glycol 0.4-0.3 % SOLN Place 1 drop into both eyes 3 (three) times daily as needed (for lubricating eyes).    . prednisoLONE acetate (PRED FORTE) 1 % ophthalmic suspension Place 1 drop into the left eye daily.     Marland Kitchen spironolactone (ALDACTONE) 25 MG tablet Take 1 tablet (25 mg total) by mouth daily. 90 tablet 3   No facility-administered medications prior to visit.      ROS Review of Systems  Constitutional: Negative for chills, fever and malaise/fatigue.  Eyes: Negative for blurred vision.  Respiratory: Negative for shortness of breath.   Cardiovascular: Negative for chest pain and palpitations.  Gastrointestinal: Negative for abdominal pain and nausea.  Genitourinary: Negative for dysuria and hematuria.  Musculoskeletal: Positive for neck pain. Negative for joint pain and myalgias.  Skin: Negative for rash.  Neurological: Positive for tingling. Negative for headaches.  Psychiatric/Behavioral: Negative for depression. The patient is not nervous/anxious.     Objective:  BP 132/71 (BP Location: Left Arm, Patient Position: Sitting, Cuff Size: Large)   Pulse (!) 52   Temp 97.6 F (36.4 C) (Oral)   Wt 147 lb 12.8 oz (67 kg)   SpO2 97%   BMI 30.89 kg/m  BP/Weight 02/18/2018 12/20/2017 5/00/3704  Systolic BP 888 916 945  Diastolic BP 71 70 71  Wt. (Lbs) 147.8 147.2 146.4  BMI 30.89 30.76 30.6      Physical Exam  Constitutional: She is oriented to person, place, and time.  Elderly, frail, NAD, polite  HENT:  Head: Normocephalic and atraumatic.  Eyes: No scleral icterus.  Neck: Normal range of motion. Neck supple. No thyromegaly present.  Cardiovascular: Normal rate, regular rhythm and normal heart sounds.  No LE edema bilaterally  Pulmonary/Chest: Effort normal and breath sounds normal.  Musculoskeletal: She exhibits no edema.  Neurological: She is alert and oriented to  person, place, and time.  Skin: Skin is warm and dry. No rash noted. No erythema. No pallor.  Psychiatric: She has a normal mood and affect. Her behavior is normal. Thought content normal.  Vitals reviewed.    Assessment & Plan:   1. Essential hypertension - Basic Metabolic Panel - Refill furosemide (LASIX) 20 MG tablet; Take 1 tablet (20 mg total) by mouth daily.  Dispense: 90 tablet; Refill: 2 - Continue Hyzaar and Amlodipine  2. Lower extremity edema - Resolved  - Basic Metabolic Panel - Refill furosemide (LASIX) 20 MG tablet; Take 1 tablet (20 mg total) by mouth daily.  Dispense: 90 tablet; Refill: 2 - Keep wearing compression stockings.   3. Need for pneumococcal vaccination - Pneumococcal polysaccharide vaccine 23-valent greater than or equal to 2yo subcutaneous/IM  4. Estrogen deficiency - DG Bone Density; Future  5. Perforation of right tympanic membrane - Resolving and nearly healed.  6. Tingling - Etiology likely from cervical spondylosis. - Begin gabapentin (NEURONTIN) 100 MG capsule; Take 1 capsule (100 mg total) by mouth at bedtime.  Dispense: 30 capsule; Refill: 5     Meds ordered this encounter  Medications  . furosemide (LASIX) 20 MG tablet    Sig: Take 1 tablet (20 mg total) by mouth daily.    Dispense:  90 tablet    Refill:  2    Order Specific Question:   Supervising Provider    Answer:   Charlott Rakes [[id number]]  . gabapentin (NEURONTIN) 100 MG capsule    Sig: Take 1 capsule (100 mg total) by mouth at bedtime.    Dispense:  30 capsule    Refill:  5    Order Specific Question:   Supervising Provider    Answer:   Charlott Rakes [[id number]]    Follow-up: 4 months for HTN and K check.  Clent Demark PA

## 2018-02-19 ENCOUNTER — Telehealth (INDEPENDENT_AMBULATORY_CARE_PROVIDER_SITE_OTHER): Payer: Self-pay

## 2018-02-19 LAB — BASIC METABOLIC PANEL
BUN/Creatinine Ratio: 22 (ref 12–28)
BUN: 30 mg/dL — ABNORMAL HIGH (ref 8–27)
CHLORIDE: 104 mmol/L (ref 96–106)
CO2: 27 mmol/L (ref 20–29)
Calcium: 9.5 mg/dL (ref 8.7–10.3)
Creatinine, Ser: 1.37 mg/dL — ABNORMAL HIGH (ref 0.57–1.00)
GFR calc Af Amer: 41 mL/min/{1.73_m2} — ABNORMAL LOW (ref >59)
GFR calc non Af Amer: 35 mL/min/{1.73_m2} — ABNORMAL LOW (ref >59)
GLUCOSE: 90 mg/dL (ref 65–99)
POTASSIUM: 4.7 mmol/L (ref 3.5–5.2)
SODIUM: 144 mmol/L (ref 134–144)

## 2018-02-19 NOTE — Telephone Encounter (Signed)
Patient is aware of stable chronic kidney disease. Patient has read information on gabapentin and does not want to take it. She states that she read people with vision problems and kidney disease should not take. And that it causes dizziness and other symptoms. She does not feel safe to take it. Please advise. [name], CMA

## 2018-02-19 NOTE — Telephone Encounter (Signed)
-----   Message from Clent Demark, PA-C sent at 02/19/2018  8:37 AM EDT ----- Chronic kidney disease stable.

## 2018-02-19 NOTE — Telephone Encounter (Signed)
Pt is safe to take the dose I prescribed her. I intentionally prescribed that dose considering her renal clearance. She is free to take if she wants.

## 2018-02-20 NOTE — Telephone Encounter (Signed)
Patient is aware. But is waiting for her eye doctor at [facility] to call her back and let her know if she feels that patient is ok to take based on her glaucoma. Patient will let PCP office know what is determined by other provider. [name], CMA

## 2018-02-26 ENCOUNTER — Encounter: Payer: Self-pay | Admitting: Physician Assistant

## 2018-03-12 ENCOUNTER — Other Ambulatory Visit (INDEPENDENT_AMBULATORY_CARE_PROVIDER_SITE_OTHER): Payer: Self-pay | Admitting: Physician Assistant

## 2018-03-12 DIAGNOSIS — R202 Paresthesia of skin: Secondary | ICD-10-CM

## 2018-03-12 NOTE — Telephone Encounter (Signed)
FWD to PCP. Rashied Corallo S Jeweliana Dudgeon, CMA  

## 2018-04-16 ENCOUNTER — Telehealth (INDEPENDENT_AMBULATORY_CARE_PROVIDER_SITE_OTHER): Payer: Self-pay | Admitting: Physician Assistant

## 2018-04-16 NOTE — Telephone Encounter (Signed)
Patient is aware to not drink supplement as PCP is not aware of what is and because patient states it is used for dialysis patient and she is not on dialysis. Patient expressed understanding.[name], CMA

## 2018-04-16 NOTE — Telephone Encounter (Signed)
Patient called wanting to know if it was okay for her to take this supplemental drink named " Netro". Patient stated that she was in a food drive and was able to obtain a couple of them. Patient states that the drink is for dialysis patient and she is not one of them. Patient would like a call back from PCP with answer.  Please follow up [phone]  Thank you [name]

## 2018-04-16 NOTE — Telephone Encounter (Signed)
FWD to PCP. Please advise. [name], CMA

## 2018-04-16 NOTE — Telephone Encounter (Signed)
I don't know what [place]izzard is and I can not find information on it. Don't take it if used for dialysis patients as she is not on dialysis.

## 2018-04-23 ENCOUNTER — Ambulatory Visit
Admission: RE | Admit: 2018-04-23 | Discharge: 2018-04-23 | Disposition: A | Discharge status: Home / Self Care | Payer: Medicare HMO | Source: Ambulatory Visit | Attending: Physician Assistant | Admitting: Physician Assistant

## 2018-04-23 DIAGNOSIS — E2839 Other primary ovarian failure: Secondary | ICD-10-CM

## 2018-04-24 ENCOUNTER — Other Ambulatory Visit (INDEPENDENT_AMBULATORY_CARE_PROVIDER_SITE_OTHER): Payer: Self-pay | Admitting: Physician Assistant

## 2018-04-24 ENCOUNTER — Telehealth (INDEPENDENT_AMBULATORY_CARE_PROVIDER_SITE_OTHER): Payer: Self-pay

## 2018-04-24 MED ORDER — ALENDRONATE SODIUM 35 MG PO TABS: 35.0000 mg | ORAL_TABLET | ORAL | 3 refills | Status: DC

## 2018-04-24 NOTE — Telephone Encounter (Signed)
-----   Message from Clent Demark, PA-C sent at 04/24/2018  1:35 PM EDT ----- Bone density shows osteopenia which is weakened bone density. I have sent alendronate to [facility] on [address]. She should take one pill every seven days.

## 2018-04-24 NOTE — Telephone Encounter (Signed)
Patient is aware that bone density revealed osteopenia which is weakened bone density. Alendronate has been sent to [facility] on [address] Take one pill of alendronate every seven days. Patient expressed understanding. [name], CMA

## 2018-04-30 ENCOUNTER — Telehealth (INDEPENDENT_AMBULATORY_CARE_PROVIDER_SITE_OTHER): Payer: Self-pay

## 2018-04-30 NOTE — Telephone Encounter (Signed)
She may schedule office visit or go to urgent care if worrisome and she can not wait.

## 2018-04-30 NOTE — Telephone Encounter (Signed)
Patient called stating that PCP was supposed to order abdominal ultrasound at last OV. She is stating she needs to have this done due to some discomfort. States this was mentioned to PCP at last visit. Explained to patient that last office note does not make any mention of abdominal discomfort. She was informed that PCP may request her to schedule OV to be evaluated for abdominal discomfort. Please advise. [name], CMA

## 2018-05-01 NOTE — Telephone Encounter (Signed)
Patient scheduled for OV on 10/30 at 9:50. [name], CMA

## 2018-05-29 ENCOUNTER — Other Ambulatory Visit: Payer: Self-pay

## 2018-05-29 ENCOUNTER — Ambulatory Visit (INDEPENDENT_AMBULATORY_CARE_PROVIDER_SITE_OTHER): Payer: Medicare HMO | Admitting: Physician Assistant

## 2018-05-29 ENCOUNTER — Encounter (INDEPENDENT_AMBULATORY_CARE_PROVIDER_SITE_OTHER): Payer: Self-pay | Admitting: Physician Assistant

## 2018-05-29 VITALS — BP 164/76 | HR 55 | Temp 97.5°F | Ht <= 58 in | Wt 155.4 lb

## 2018-05-29 DIAGNOSIS — N949 Unspecified condition associated with female genital organs and menstrual cycle: Secondary | ICD-10-CM | POA: Diagnosis not present

## 2018-05-29 DIAGNOSIS — H9201 Otalgia, right ear: Secondary | ICD-10-CM | POA: Diagnosis not present

## 2018-05-29 DIAGNOSIS — Z79899 Other long term (current) drug therapy: Secondary | ICD-10-CM

## 2018-05-29 DIAGNOSIS — H7191 Unspecified cholesteatoma, right ear: Secondary | ICD-10-CM

## 2018-05-29 DIAGNOSIS — G8929 Other chronic pain: Secondary | ICD-10-CM

## 2018-05-29 DIAGNOSIS — M47812 Spondylosis without myelopathy or radiculopathy, cervical region: Secondary | ICD-10-CM

## 2018-05-29 NOTE — Progress Notes (Signed)
Subjective:  Patient ID: [name], female    DOB: 1930/07/26  Age: 84 y.o. MRN: [id number]  CC: abdominal pain  HPI [name] a 84 y.o.femalewith a medical history of HTN, CKD3, Insomnia, Glaucoma, OA, and CAP presentsto f/u on right pelvic pain. States right pelvic pain persists but usually waxes and wanes. She had a pelvic/transvaginal US done nearly one year ago which revealed a benign appearing right adnexal cystic structure that decreased in size over previous imaging. Radiologist recommended yearly imaging.      Main complaint is of right ear pain. Went to otolaryngologist which ordered a CT Temporal bones w/o contrast on 07/30/18. CT revealed "6 mm right middle ear mass suspicious for pars tensa cholesteatoma. No definite ossicular erosion".  Dr. Redmond Baseman at otolaryngology was planning for surgery but pt states she didn't know anything about it. There is baseline loss of audition and does not endorse tinnitus.      Also complains of chronic neck pain. Was diagnosed by Dr. Lorin Mercy at orthopedics with cervical spondylosis. Had a few sessions of PT but did not return. She has not seen Dr. Lorin Mercy on f/u either.       Outpatient Medications Prior to Visit  Medication Sig Dispense Refill  . ACAI PO Take 1 capsule by mouth 2 (two) times daily.    Marland Kitchen alendronate (FOSAMAX) 35 MG tablet Take 1 tablet (35 mg total) by mouth every 7 (seven) days. Take with a full glass of water on an empty stomach. 12 tablet 3  . amLODipine (NORVASC) 2.5 MG tablet Take 1 tablet (2.5 mg total) by mouth daily. 90 tablet 3  . aspirin EC 81 MG tablet Take 1 tablet (81 mg total) by mouth daily. 90 tablet 3  . brimonidine (ALPHAGAN) 0.15 % ophthalmic solution Place 1 drop into the left eye 2 (two) times daily.     . Cyanocobalamin (VITAMIN B-12 PO) Take 1 tablet by mouth daily.    Marland Kitchen docusate sodium (COLACE) 50 MG capsule Take 1 capsule (50 mg total) by mouth 2 (two) times daily. 10 capsule 0  . furosemide  (LASIX) 20 MG tablet Take 1 tablet (20 mg total) by mouth daily. 90 tablet 2  . gabapentin (NEURONTIN) 100 MG capsule TAKE 1 CAPSULE (100 MG TOTAL) BY MOUTH AT BEDTIME. 90 capsule 1  . GARLIC PO Take 3,790 mg by mouth daily.     Marland Kitchen losartan-hydrochlorothiazide (HYZAAR) 100-25 MG tablet Take 1 tablet by mouth daily. 90 tablet 3  . Melatonin 5 MG TABS Take 1 tablet (5 mg total) by mouth at bedtime. 30 tablet 3  . Multiple Vitamin (MULTIVITAMIN WITH MINERALS) TABS Take 1 tablet by mouth daily.    Vladimir Faster Glycol-Propyl Glycol 0.4-0.3 % SOLN Place 1 drop into both eyes 3 (three) times daily as needed (for lubricating eyes).    . prednisoLONE acetate (PRED FORTE) 1 % ophthalmic suspension Place 1 drop into the left eye daily.     Marland Kitchen spironolactone (ALDACTONE) 25 MG tablet Take 1 tablet (25 mg total) by mouth daily. 90 tablet 3   No facility-administered medications prior to visit.      ROS Review of Systems  Constitutional: Negative for chills, fever and malaise/fatigue.  HENT: Positive for ear pain.   Eyes: Negative for blurred vision.  Respiratory: Negative for shortness of breath.   Cardiovascular: Negative for chest pain and palpitations.  Gastrointestinal: Positive for abdominal pain. Negative for nausea.  Genitourinary: Negative for dysuria and hematuria.  Musculoskeletal: Positive for neck pain. Negative for joint pain and myalgias.  Skin: Negative for rash.  Neurological: Negative for tingling and headaches.  Psychiatric/Behavioral: Negative for depression. The patient is not nervous/anxious.     Objective:  BP (!) 164/76 (BP Location: Left Arm, Patient Position: Sitting, Cuff Size: Normal)   Pulse (!) 55   Temp (!) 97.5 F (36.4 C) (Oral)   Ht 4\' 10"  (1.473 m)   Wt 155 lb 6.4 oz (70.5 kg)   SpO2 100%   BMI 32.48 kg/m   BP/Weight 05/29/2018 02/18/2018 8/56/3149  Systolic BP 702 637 858  Diastolic BP 76 71 70  Wt. (Lbs) 155.4 147.8 147.2  BMI 32.48 30.89 30.76       Physical Exam  Constitutional: She is oriented to person, place, and time.  Elderly, frail, NAD, polite, abnormal posture  HENT:  Head: Normocephalic and atraumatic.  Right ear canal with cerumen impaction  Eyes: No scleral icterus.  Neck: Normal range of motion. Neck supple. No thyromegaly present.  Cardiovascular: Normal rate and regular rhythm.  Murmur (2/6 systolic) heard. Pulmonary/Chest: Effort normal and breath sounds normal.  Abdominal: Soft. Bowel sounds are normal. There is no tenderness.  Genitourinary:  Genitourinary Comments: Mild right pelvic TTP  Musculoskeletal: She exhibits no edema.  Loss of lordosis of the c-spine  Neurological: She is alert and oriented to person, place, and time.  Skin: Skin is warm and dry. No rash noted. No erythema. No pallor.  Psychiatric: She has a normal mood and affect. Her behavior is normal. Thought content normal.  Vitals reviewed.    Assessment & Plan:    1. Adnexal cyst - US Pelvic Complete With Transvaginal; Future  2. Cholesteatoma of right ear - Pt advised to return to Dr. Redmond Baseman to discuss removal of cholesteatoma and TM perforation repair.   3. Chronic right ear pain - Pt advised to return to Dr. Redmond Baseman to discuss removal of cholesteatoma and TM perforation repair.   4. Cervical spondylosis without myelopathy - Advised pt to call [place] [facility] to resume PT and to see Dr. Lorin Mercy.   5. High risk medication use - Comprehensive metabolic panel - CBC with Differential    Follow-up: Return in about 2 months (around 07/29/2018) for HTN.   Clent Demark PA

## 2018-05-29 NOTE — Patient Instructions (Addendum)
Please call [facility] ENT at  [phone]. You may need surgery to remove your cholesteatoma and repair your perforation.   You may call [facility] at [phone] to discuss your cervical spondylosis treatment.  Spondylolysis Spondylolysis is a small break or crack (stress fracture) in a bone in the spine (vertebra) in the lower back (lumbar spine). The stress fracture occurs on the bony mass between and behind the vertebra. Spondylolysis may be caused by an injury (trauma) or by overuse. Since the lower back is almost always under pressure from daily living, this stress fracture usually does not heal normally. Spondylolysis may eventually cause one vertebra to slip forward and out of place (spondylolisthesis). What are the causes? This condition may be caused by:  Trauma, such as a fall.  Excessive wear and tear. This is often a result of doing sports or physical activities that involve repetitive overstretching (hyperextension) and rotation of the spine.  What increases the risk? You may have a greater risk for spondylolysis if you participate in:  Gymnastics.  Weight lifting.  Rowing.  Diving.  Wrestling.  Tennis.  Soccer.  What are the signs or symptoms? Symptoms of this condition may include:  Long-lasting (chronic) pain in the lower back.  Stiffness in the back or the legs.  Tightness in the hamstring muscles, which are in the backs of the thighs.  In some cases, there may be no symptoms of this condition. How is this diagnosed?  This condition may be diagnosed based on:  Your symptoms.  Your medical history.  A physical exam. ? Your health care provider may push on certain areas to determine the source of your pain. ? You may be asked to bend forward, backward, and side to side so your health care provider can assess the severity of your pain and your range of motion.  Imaging tests, such as: ? X-rays. ? CT  scan. ? MRI.  How is this treated? Treatment for this condition may include:  Resting. This may involve avoiding or modifying activities that put strain on your back until your symptoms improve.  Medicines to help relieve pain.  NSAIDs to help reduce swelling and discomfort.  Injections of medicine (cortisone) in your back. These injections can to help relieve pain and numbness.  A brace to stabilize and support your back.  Physical therapy. This may include working with an occupational therapist or physical therapist who can teach you how to reduce pressure on your back while you do everyday activities.  Surgery. This may be needed if you have: ? A severe injury. ? Pain that lasts for more than 6 months.  Follow these instructions at home: If you have a brace:  Wear it as told by your health care provider. Remove it only as told by your health care provider.  Do not let your brace get wet if it is not waterproof.  Keep the brace clean. Driving  Do not drive or operate heavy machinery until you know how your pain medicine affects you.  Ask your health care provider when it is safe to drive if you have a back brace. Activity  Rest and return to your normal activities as told by your health care provider. Ask your health care provider what activities are safe for you.  Avoid activities that take a lot of effort (are strenuous) for as long as told by your health care provider.  Do exercises as told by your health care provider. General instructions  Take over-the-counter and prescription medicines only as told by your health care provider.  If you have questions or concerns about safety while taking pain medicine, talk with your health care provider.  Do not use any tobacco products, such as cigarettes, chewing tobacco, and e-cigarettes. Tobacco can delay bone healing. If you need help quitting, ask your health care provider.  Keep all follow-up visits as told by your  health care provider. This is important. How is this prevented?  Warm up and stretch before being active.  Cool down and stretch after being active.  Give your body time to rest between periods of activity.  Make sure to use equipment that fits you.  Be safe and responsible while being active to avoid falls.  Do at least 150 minutes of moderate-intensity exercise each week, such as brisk walking or water aerobics.  Maintain physical fitness, including: ? Strength. ? Flexibility. ? Cardiovascular fitness. ? Endurance. Contact a health care provider if:  You have pain that gets worse or does not get better. Get help right away if:  You have severe back pain.  You develop weakness or numbness in your legs.  You are unable to stand or walk. This information is not intended to replace advice given to you by your health care provider. Make sure you discuss any questions you have with your health care provider. Document Released: 07/17/2005 Document Revised: 03/23/2016 Document Reviewed: 04/27/2015 Elsevier Interactive Patient Education  [employer].

## 2018-05-30 LAB — COMPREHENSIVE METABOLIC PANEL
A/G RATIO: 1.4 (ref 1.2–2.2)
ALBUMIN: 3.8 g/dL (ref 3.5–4.7)
ALT: 14 IU/L (ref 0–32)
AST: 19 IU/L (ref 0–40)
Alkaline Phosphatase: 101 IU/L (ref 39–117)
BUN / CREAT RATIO: 25 (ref 12–28)
BUN: 33 mg/dL — ABNORMAL HIGH (ref 8–27)
Bilirubin Total: 0.6 mg/dL (ref 0.0–1.2)
CALCIUM: 9.4 mg/dL (ref 8.7–10.3)
CO2: 25 mmol/L (ref 20–29)
CREATININE: 1.34 mg/dL — AB (ref 0.57–1.00)
Chloride: 101 mmol/L (ref 96–106)
GFR, EST AFRICAN AMERICAN: 42 mL/min/{1.73_m2} — AB (ref >59)
GFR, EST NON AFRICAN AMERICAN: 36 mL/min/{1.73_m2} — AB (ref >59)
GLOBULIN, TOTAL: 2.8 g/dL (ref 1.5–4.5)
Glucose: 89 mg/dL (ref 65–99)
Potassium: 4.6 mmol/L (ref 3.5–5.2)
SODIUM: 141 mmol/L (ref 134–144)
Total Protein: 6.6 g/dL (ref 6.0–8.5)

## 2018-05-30 LAB — CBC WITH DIFFERENTIAL/PLATELET
Basophils Absolute: 0 10*3/uL (ref 0.0–0.2)
Basos: 1 %
EOS (ABSOLUTE): 0.1 10*3/uL (ref 0.0–0.4)
EOS: 4 %
Hematocrit: 35.3 % (ref 34.0–46.6)
Hemoglobin: 11.3 g/dL (ref 11.1–15.9)
IMMATURE GRANS (ABS): 0 10*3/uL (ref 0.0–0.1)
IMMATURE GRANULOCYTES: 0 %
Lymphocytes Absolute: 1.3 10*3/uL (ref 0.7–3.1)
Lymphs: 42 %
MCH: 26.5 pg — AB (ref 26.6–33.0)
MCHC: 32 g/dL (ref 31.5–35.7)
MCV: 83 fL (ref 79–97)
MONOS ABS: 0.4 10*3/uL (ref 0.1–0.9)
Monocytes: 11 %
NEUTROS ABS: 1.4 10*3/uL (ref 1.4–7.0)
NEUTROS PCT: 42 %
Platelets: 194 10*3/uL (ref 150–450)
RBC: 4.27 x10E6/uL (ref 3.77–5.28)
RDW: 15 % (ref 12.3–15.4)
WBC: 3.2 10*3/uL — ABNORMAL LOW (ref 3.4–10.8)

## 2018-05-31 ENCOUNTER — Telehealth (INDEPENDENT_AMBULATORY_CARE_PROVIDER_SITE_OTHER): Payer: Self-pay

## 2018-05-31 NOTE — Telephone Encounter (Signed)
-----   Message from Clent Demark, PA-C sent at 05/30/2018  1:42 PM EDT ----- CKD stable. Rest of labs normal.

## 2018-05-31 NOTE — Telephone Encounter (Signed)
Patient is aware that CKD is stable and all other labs normal. [name], CMA

## 2018-06-04 ENCOUNTER — Ambulatory Visit (HOSPITAL_COMMUNITY)
Admission: RE | Admit: 2018-06-04 | Discharge: 2018-06-04 | Disposition: A | Discharge status: Home / Self Care | Payer: Medicare HMO | Source: Ambulatory Visit | Attending: Physician Assistant | Admitting: Physician Assistant

## 2018-06-04 DIAGNOSIS — N949 Unspecified condition associated with female genital organs and menstrual cycle: Secondary | ICD-10-CM | POA: Diagnosis present

## 2018-06-05 ENCOUNTER — Telehealth (INDEPENDENT_AMBULATORY_CARE_PROVIDER_SITE_OTHER): Payer: Self-pay

## 2018-06-05 NOTE — Telephone Encounter (Signed)
Patient is aware that adnexal cyst is similar in size to previous exams. Recommended to have yearly follow ups with ultrasound. Patient mentioned while on call that cardiologist instructed her to stop taking amlodipine for the time being since blood pressures have been to low. [name], CMA

## 2018-06-05 NOTE — Telephone Encounter (Signed)
Noted  

## 2018-06-05 NOTE — Telephone Encounter (Signed)
-----   Message from Clent Demark, PA-C sent at 06/04/2018  5:29 PM EST ----- Right adnexal cyst is similar in size to previous exams. Recommend yearly follow ups with ultrasound.

## 2018-06-12 ENCOUNTER — Other Ambulatory Visit (INDEPENDENT_AMBULATORY_CARE_PROVIDER_SITE_OTHER): Payer: Self-pay | Admitting: Physician Assistant

## 2018-06-12 DIAGNOSIS — R202 Paresthesia of skin: Secondary | ICD-10-CM

## 2018-06-12 NOTE — Telephone Encounter (Signed)
FWD to PCP. Tempestt S Roberts, CMA  

## 2018-06-18 ENCOUNTER — Other Ambulatory Visit (INDEPENDENT_AMBULATORY_CARE_PROVIDER_SITE_OTHER): Payer: Self-pay | Admitting: Physician Assistant

## 2018-06-18 DIAGNOSIS — I10 Essential (primary) hypertension: Secondary | ICD-10-CM

## 2018-06-18 MED ORDER — HYDROCHLOROTHIAZIDE 25 MG PO TABS: 25.0000 mg | ORAL_TABLET | Freq: Every day | ORAL | 3 refills | Status: DC

## 2018-06-18 MED ORDER — LOSARTAN POTASSIUM 100 MG PO TABS: 100.0000 mg | ORAL_TABLET | Freq: Every day | ORAL | 3 refills | Status: DC

## 2018-06-21 ENCOUNTER — Encounter (HOSPITAL_COMMUNITY): Payer: Self-pay | Admitting: Family Medicine

## 2018-06-21 ENCOUNTER — Ambulatory Visit (HOSPITAL_COMMUNITY)
Admission: EM | Admit: 2018-06-21 | Discharge: 2018-06-21 | Disposition: A | Discharge status: Home / Self Care | Payer: Medicare HMO | Attending: Family Medicine | Admitting: Family Medicine

## 2018-06-21 DIAGNOSIS — H16001 Unspecified corneal ulcer, right eye: Secondary | ICD-10-CM

## 2018-06-21 NOTE — Discharge Instructions (Addendum)
Dr. Katy Fitch has agreed to see you and will leave his door open if you go right now.  Otherwise, this will need to be treated in the emergency room.

## 2018-06-21 NOTE — ED Triage Notes (Signed)
Pt c/o L eye redness and drainage. Pt is mostly blind in that eye per her baseline.

## 2018-06-21 NOTE — ED Provider Notes (Signed)
[facility]    CSN: [id number] Arrival date & time: 06/21/18  1444     History   Chief Complaint No chief complaint on file.   HPI [name] is a 84 y.o. female.   This is an 84 year old woman who is an established patient here at the urgent care is complaining about an eye problem.  She has had a corneal transplant in the past at [facility] which failed.     Past Medical History:  Diagnosis Date  . CAP (community acquired pneumonia) 09/21/2015  . Chronic kidney disease, stage III (moderate) (HCC)   . Encounter for long-term (current) use of other medications   . Glaucoma (increased eye pressure)   . Head pain   . Hypertension   . Impaired glucose tolerance test   . Insomnia, unspecified   . Morbid obesity ([place])   . Neck pain   . Osteoarthrosis, unspecified whether generalized or localized, other specified sites   . Other lymphedema   . Pain in joint, multiple sites   . Primary open-angle glaucoma(365.11)     Patient Active Problem List   Diagnosis Date Noted  . Mitral valve insufficiency 03/19/2017  . Other spondylosis with radiculopathy, cervical region 03/14/2017  . Cerumen impaction 12/04/2016  . Generalized weakness 09/22/2015  . Acidosis 09/22/2015  . Acute encephalopathy 09/22/2015  . Hyperglycemia 09/22/2015  . Leukopenia 09/22/2015  . CAP (community acquired pneumonia) 09/22/2015  . Bilateral leg edema 09/22/2015  . Constipation 09/22/2015  . Snoring 09/13/2015  . Glaucoma (increased eye pressure)   . Hypertension   . Neck pain   . Head pain   . Pain in joint, multiple sites   . Impaired glucose tolerance test   . Osteoarthrosis, unspecified whether generalized or localized, other specified sites   . Morbid obesity ([place])   . Encounter for long-term (current) use of other medications   . Other lymphedema   . Insomnia, unspecified   . Chronic kidney disease, stage III (moderate) (HCC)   . Primary open-angle glaucoma(365.11)      Past Surgical History:  Procedure Laterality Date  . ABDOMINAL HYSTERECTOMY    . EYE SURGERY      OB History   None      Home Medications    Prior to Admission medications   Medication Sig Start Date End Date Taking? Authorizing Provider  ACAI PO Take 1 capsule by mouth 2 (two) times daily.    [provider]  alendronate (FOSAMAX) 35 MG tablet Take 1 tablet (35 mg total) by mouth every 7 (seven) days. Take with a full glass of water on an empty stomach. 04/24/18   Clent Demark, PA-C  amLODipine (NORVASC) 2.5 MG tablet Take 1 tablet (2.5 mg total) by mouth daily. 11/19/17   Clent Demark, PA-C  aspirin EC 81 MG tablet Take 1 tablet (81 mg total) by mouth daily. 03/26/17   Clent Demark, PA-C  brimonidine (ALPHAGAN) 0.15 % ophthalmic solution Place 1 drop into the left eye 2 (two) times daily.  10/29/12   [provider]  Cyanocobalamin (VITAMIN B-12 PO) Take 1 tablet by mouth daily.    [provider]  docusate sodium (COLACE) 50 MG capsule Take 1 capsule (50 mg total) by mouth 2 (two) times daily. 03/26/17   Clent Demark, PA-C  furosemide (LASIX) 20 MG tablet Take 1 tablet (20 mg total) by mouth daily. 02/18/18   Clent Demark, PA-C  gabapentin (NEURONTIN) 100 MG  capsule TAKE 1 CAPSULE (100 MG TOTAL) BY MOUTH AT BEDTIME. 06/12/18   Clent Demark, PA-C  GARLIC PO Take 4,098 mg by mouth daily.     [provider]  hydrochlorothiazide (HYDRODIURIL) 25 MG tablet Take 1 tablet (25 mg total) by mouth daily. 06/18/18   Clent Demark, PA-C  losartan (COZAAR) 100 MG tablet Take 1 tablet (100 mg total) by mouth daily. 06/18/18   Clent Demark, PA-C  Melatonin 5 MG TABS Take 1 tablet (5 mg total) by mouth at bedtime. 09/20/17   Clent Demark, PA-C  Multiple Vitamin (MULTIVITAMIN WITH MINERALS) TABS Take 1 tablet by mouth daily.    [provider]  Polyethyl Glycol-Propyl Glycol 0.4-0.3 % SOLN Place 1 drop  into both eyes 3 (three) times daily as needed (for lubricating eyes).    [provider]  prednisoLONE acetate (PRED FORTE) 1 % ophthalmic suspension Place 1 drop into the left eye daily.  10/29/12   [provider]  spironolactone (ALDACTONE) 25 MG tablet Take 1 tablet (25 mg total) by mouth daily. 11/19/17   Clent Demark, PA-C    Family History Family History  Problem Relation Age of Onset  . Cancer Father     Social History Social History   Tobacco Use  . Smoking status: Never Smoker  . Smokeless tobacco: Never Used  Substance Use Topics  . Alcohol use: No    Alcohol/week: 0.0 standard drinks  . Drug use: No     Allergies   Penicillins and Tetanus toxoid   Review of Systems Review of Systems   Physical Exam Triage Vital Signs ED Triage Vitals  Enc Vitals Group     BP      Pulse      Resp      Temp      Temp src      SpO2      Weight      Height      Head Circumference      Peak Flow      Pain Score      Pain Loc      Pain Edu?      Excl. in [place]?    No data found.  Updated Vital Signs There were no vitals taken for this visit.   Physical Exam  Constitutional: She appears well-developed and well-nourished.  Nursing note and vitals reviewed.      UC Treatments / Results  Labs (all labs ordered are listed, but only abnormal results are displayed) Labs Reviewed - No data to display  EKG None  Radiology No results found.  Procedures Procedures (including critical care time)  Medications Ordered in UC Medications - No data to display  Initial Impression / Assessment and Plan / UC Course  I have reviewed the triage vital signs and the nursing notes.  Pertinent labs & imaging results that were available during my care of the patient were reviewed by me and considered in my medical decision making (see chart for details).    Final Clinical Impressions(s) / UC Diagnoses   Final diagnoses:  None   Discharge  Instructions   None    ED Prescriptions    None     Controlled Substance Prescriptions [place] Controlled Substance Registry consulted? Not Applicable   Robyn Haber, MD 06/21/18 1600

## 2018-06-24 ENCOUNTER — Ambulatory Visit (INDEPENDENT_AMBULATORY_CARE_PROVIDER_SITE_OTHER): Payer: Medicare HMO | Admitting: Physician Assistant

## 2018-08-05 ENCOUNTER — Other Ambulatory Visit (INDEPENDENT_AMBULATORY_CARE_PROVIDER_SITE_OTHER): Payer: Self-pay | Admitting: Physician Assistant

## 2018-08-05 ENCOUNTER — Telehealth (INDEPENDENT_AMBULATORY_CARE_PROVIDER_SITE_OTHER): Payer: Self-pay | Admitting: Physician Assistant

## 2018-08-05 DIAGNOSIS — R2681 Unsteadiness on feet: Secondary | ICD-10-CM

## 2018-08-05 NOTE — Telephone Encounter (Signed)
Patient daughter called to request a prescription for a walker states that patient is having trouble walking. States that she has to stop and control her balance during short or long walks. Patient uses a cane but states it is not helping.  Please advice [phone] or [phone] [facility] Daughter)  Thank you Emmit Pomfret

## 2018-08-05 NOTE — Telephone Encounter (Signed)
FWD to PCP. Shwanda Soltis S Demarri Elie, CMA  

## 2018-08-05 NOTE — Telephone Encounter (Signed)
Left voicemail advising patient that RX is ready for pick up. I also called patient daughter Solmon Ice and stated they would come in tomorrow to pick up the RX.  Emmit Pomfret

## 2018-08-05 NOTE — Telephone Encounter (Signed)
I have written order for walker. Please have someone pick up signed order and take to medical supply store.

## 2018-08-16 ENCOUNTER — Encounter (HOSPITAL_COMMUNITY): Payer: Self-pay

## 2018-08-16 ENCOUNTER — Emergency Department (HOSPITAL_COMMUNITY)
Admission: EM | Admit: 2018-08-16 | Discharge: 2018-08-16 | Disposition: A | Discharge status: Home / Self Care | Payer: Medicare HMO | Attending: Emergency Medicine | Admitting: Emergency Medicine

## 2018-08-16 ENCOUNTER — Emergency Department (HOSPITAL_COMMUNITY): Payer: Medicare HMO

## 2018-08-16 DIAGNOSIS — S81811A Laceration without foreign body, right lower leg, initial encounter: Secondary | ICD-10-CM | POA: Diagnosis not present

## 2018-08-16 DIAGNOSIS — Z79899 Other long term (current) drug therapy: Secondary | ICD-10-CM | POA: Diagnosis not present

## 2018-08-16 DIAGNOSIS — I129 Hypertensive chronic kidney disease with stage 1 through stage 4 chronic kidney disease, or unspecified chronic kidney disease: Secondary | ICD-10-CM | POA: Diagnosis not present

## 2018-08-16 DIAGNOSIS — N183 Chronic kidney disease, stage 3 (moderate): Secondary | ICD-10-CM | POA: Diagnosis not present

## 2018-08-16 DIAGNOSIS — Y9389 Activity, other specified: Secondary | ICD-10-CM | POA: Insufficient documentation

## 2018-08-16 DIAGNOSIS — Z7982 Long term (current) use of aspirin: Secondary | ICD-10-CM | POA: Diagnosis not present

## 2018-08-16 DIAGNOSIS — Y998 Other external cause status: Secondary | ICD-10-CM | POA: Insufficient documentation

## 2018-08-16 DIAGNOSIS — Y92019 Unspecified place in single-family (private) house as the place of occurrence of the external cause: Secondary | ICD-10-CM | POA: Diagnosis not present

## 2018-08-16 DIAGNOSIS — W228XXA Striking against or struck by other objects, initial encounter: Secondary | ICD-10-CM | POA: Diagnosis not present

## 2018-08-16 MED ORDER — LIDOCAINE HCL (PF) 1 % IJ SOLN
30.0000 mL | Freq: Once | INTRAMUSCULAR | Status: AC
  Administered 2018-08-16: 30 mL
  Filled 2018-08-16: qty 30

## 2018-08-16 NOTE — ED Triage Notes (Signed)
Pt presents with c/o leg laceration. Pt's family reports she hit her leg on a plastic crate. Pt has a large, deep gash to her right lower leg, bleeding controlled.

## 2018-08-16 NOTE — ED Notes (Signed)
WOUND IRRIGATED WITH 1,000cc SALINE

## 2018-08-16 NOTE — Discharge Instructions (Signed)
You have been diagnosed today with Laceration of the right lower extremity.   At this time there does not appear to be the presence of an emergent medical condition, however there is always the potential for conditions to change. Please read and follow the below instructions.  Please return to the Emergency Department immediately for any new or worsening symptoms. Please be sure to follow up with your Primary Care Provider this week regarding your visit today; please call their office to schedule an appointment even if you are feeling better for a follow-up visit. Please follow-up with the wound care center on Monday for follow-up evaluation of your extremity laceration.  Call their office today to schedule this appointment.  Due to your chronic lower extremity swelling and there is an increased chance of delayed healing of your leg which is why wound care specialist is needed to ensure healing. You may continue to use the Ace wrap provided today to help with your lower extremity swelling.  Elevation of the lower extremities will also help as well.  Get help right away if: You develop severe swelling around your wound. You have pus or a bad smell coming from your wound. Your pain suddenly gets worse and is severe. You develop painful lumps near your wound or anywhere on your body. You have a red streak going away from your wound Your wound breaks open. You have redness, swelling, or pain around your wound. You have fluid or blood coming from your wound. Your wound feels warm to the touch. You have a fever. You notice something coming out of your wound, such as wood or glass. You have pain that does not get better with medicine. The skin near your wound changes color. You need to change your dressing very frequently due to a lot of fluid, blood, or pus draining from the wound. You develop a new rash. You develop numbness around the wound. The wound is on your hand or foot and: You cannot  properly move a finger or toe. Your fingers or toes look pale or bluish. You have numbness that is spreading down your hand, foot, fingers, or toes.  Please read the additional information packets attached to your discharge summary.  Do not take your medicine if  develop an itchy rash, swelling in your mouth or lips, or difficulty breathing.

## 2018-08-16 NOTE — ED Notes (Signed)
Bed: WA04 Expected date:  Expected time:  Means of arrival:  Comments: Held for triage

## 2018-08-16 NOTE — ED Provider Notes (Signed)
[place] DEPT Provider Note   CSN: [id number] Arrival date & time: 08/16/18  0931     History   Chief Complaint Chief Complaint  Patient presents with  . Extremity Laceration    HPI [name] is a 85 y.o. female with history of CKD, morbid obesity, hypertension, chronic bilateral lower extremity edema presenting today for laceration of the right lower leg.  Patient reports that 830 this morning she was walking around her house when she struck her leg on a plastic crate.  Patient describes her pain as a mild sharp sensation that was immediate however has improved since time of onset.  There was some bleeding initially however this resolved with direct pressure from family.  Patient does not take anything for pain since injury and is not requesting pain medication.  Patient's granddaughter is at bedside.  They both deny history of fall, head injury or any other concern at this time.  Patient with large gaping laceration to right lower extremity without active bleeding.  Patient states that she is feeling well at this time and is without other complaints.  HPI  Past Medical History:  Diagnosis Date  . CAP (community acquired pneumonia) 09/21/2015  . Chronic kidney disease, stage III (moderate) (HCC)   . Encounter for long-term (current) use of other medications   . Glaucoma (increased eye pressure)   . Head pain   . Hypertension   . Impaired glucose tolerance test   . Insomnia, unspecified   . Morbid obesity ([place])   . Neck pain   . Osteoarthrosis, unspecified whether generalized or localized, other specified sites   . Other lymphedema   . Pain in joint, multiple sites   . Primary open-angle glaucoma(365.11)     Patient Active Problem List   Diagnosis Date Noted  . Mitral valve insufficiency 03/19/2017  . Other spondylosis with radiculopathy, cervical region 03/14/2017  . Cerumen impaction 12/04/2016  . Generalized weakness 09/22/2015  .  Acidosis 09/22/2015  . Acute encephalopathy 09/22/2015  . Hyperglycemia 09/22/2015  . Leukopenia 09/22/2015  . CAP (community acquired pneumonia) 09/22/2015  . Bilateral leg edema 09/22/2015  . Constipation 09/22/2015  . Snoring 09/13/2015  . Glaucoma (increased eye pressure)   . Hypertension   . Neck pain   . Head pain   . Pain in joint, multiple sites   . Impaired glucose tolerance test   . Osteoarthrosis, unspecified whether generalized or localized, other specified sites   . Morbid obesity ([place])   . Encounter for long-term (current) use of other medications   . Other lymphedema   . Insomnia, unspecified   . Chronic kidney disease, stage III (moderate) (HCC)   . Primary open-angle glaucoma(365.11)     Past Surgical History:  Procedure Laterality Date  . ABDOMINAL HYSTERECTOMY    . EYE SURGERY       OB History   No obstetric history on file.      Home Medications    Prior to Admission medications   Medication Sig Start Date End Date Taking? Authorizing Provider  ACAI PO Take 1 capsule by mouth 2 (two) times daily.   Yes [provider]  alendronate (FOSAMAX) 35 MG tablet Take 1 tablet (35 mg total) by mouth every 7 (seven) days. Take with a full glass of water on an empty stomach. Patient taking differently: Take 35 mg by mouth every Sunday. Take with a full glass of water on an empty stomach. 04/24/18  Yes Domenica Fail  Remo Lipps  aspirin EC 81 MG tablet Take 1 tablet (81 mg total) by mouth daily. 03/26/17  Yes Clent Demark, PA-C  brimonidine [facility]) 0.15 % ophthalmic solution Place 1 drop into the left eye 2 (two) times daily.  10/29/12  Yes [provider]  clindamycin (CLEOCIN) 150 MG capsule Take 150 mg by mouth daily.  07/10/18  Yes [provider]  [facility] VITAMIN C 500 MG tablet Take 500 mg by mouth daily. 06/24/18  Yes [provider]  Cyanocobalamin (VITAMIN B-12 PO) Take 1 tablet by mouth daily.   Yes [provider]  doxycycline (MONODOX) 100 MG capsule Take 100 mg by mouth daily.  07/08/18  Yes [provider]  furosemide (LASIX) 20 MG tablet Take 1 tablet (20 mg total) by mouth daily. 02/18/18  Yes Clent Demark, PA-C  gabapentin (NEURONTIN) 100 MG capsule TAKE 1 CAPSULE (100 MG TOTAL) BY MOUTH AT BEDTIME. 06/12/18  Yes Clent Demark, PA-C  GARLIC PO Take 0,932 mg by mouth daily.    Yes [provider]  hydrochlorothiazide (HYDRODIURIL) 25 MG tablet Take 1 tablet (25 mg total) by mouth daily. 06/18/18  Yes Clent Demark, PA-C  losartan (COZAAR) 100 MG tablet Take 1 tablet (100 mg total) by mouth daily. 06/18/18  Yes Clent Demark, PA-C  Multiple Vitamin (MULTIVITAMIN WITH MINERALS) TABS Take 1 tablet by mouth daily.   Yes [provider]  Polyethyl Glycol-Propyl Glycol 0.4-0.3 % SOLN Place 1 drop into both eyes 3 (three) times daily as needed (for lubricating eyes).   Yes [provider]  prednisoLONE acetate (PRED FORTE) 1 % ophthalmic suspension Place 1 drop into the left eye daily.  10/29/12  Yes [provider]  spironolactone (ALDACTONE) 25 MG tablet Take 1 tablet (25 mg total) by mouth daily. 11/19/17  Yes Clent Demark, PA-C  amLODipine (NORVASC) 2.5 MG tablet Take 1 tablet (2.5 mg total) by mouth daily. Patient not taking: Reported on 08/16/2018 11/19/17   Clent Demark, PA-C  docusate sodium (COLACE) 50 MG capsule Take 1 capsule (50 mg total) by mouth 2 (two) times daily. Patient not taking: Reported on 08/16/2018 03/26/17   Clent Demark, PA-C  Melatonin 5 MG TABS Take 1 tablet (5 mg total) by mouth at bedtime. Patient not taking: Reported on 08/16/2018 09/20/17   Clent Demark, PA-C    Family History Family History  Problem Relation Age of Onset  . Cancer Father     Social History Social History   Tobacco Use  . Smoking status: Never Smoker  . Smokeless tobacco: Never Used  Substance Use Topics    . Alcohol use: No    Alcohol/week: 0.0 standard drinks  . Drug use: No     Allergies   Penicillins and Tetanus toxoid   Review of Systems Review of Systems  Constitutional: Negative.  Negative for chills and fever.  Eyes: Negative.  Negative for visual disturbance.  Respiratory: Negative.  Negative for shortness of breath.   Cardiovascular: Positive for leg swelling (Chronic). Negative for chest pain.  Gastrointestinal: Negative.  Negative for abdominal pain, nausea and vomiting.  Musculoskeletal: Negative.  Negative for arthralgias, back pain and neck pain.  Skin: Positive for wound.  Neurological: Negative.  Negative for dizziness, syncope, weakness and headaches.  All other systems reviewed and are negative.  Physical Exam Updated Vital Signs BP (!) 142/59   Pulse 68   Temp 98.5 F (36.9 C) (Oral)  Resp 18   SpO2 100%   Physical Exam Constitutional:      General: She is not in acute distress.    Appearance: Normal appearance. She is well-developed. She is obese. She is not ill-appearing.  HENT:     Head: Normocephalic and atraumatic. No raccoon eyes, Battle's sign, abrasion or contusion.     Right Ear: Tympanic membrane, ear canal and external ear normal. No hemotympanum.     Left Ear: Tympanic membrane, ear canal and external ear normal. No hemotympanum.     Nose: Nose normal.     Mouth/Throat:     Lips: Pink.     Mouth: Mucous membranes are moist.     Pharynx: Oropharynx is clear. Uvula midline.     Comments: Patient is hard of hearing Eyes:     Pupils: Pupils are equal, round, and reactive to light.     Comments: Patient is legally blind per family members at bedside  Neck:     Musculoskeletal: Full passive range of motion without pain, normal range of motion and neck supple. No spinous process tenderness or muscular tenderness.     Trachea: Trachea and phonation normal. No tracheal deviation.  Cardiovascular:     Rate and Rhythm: Normal rate and regular  rhythm.     Pulses:          Dorsalis pedis pulses are 1+ on the right side and 1+ on the left side.       Posterior tibial pulses are 1+ on the right side and 1+ on the left side.     Heart sounds: Normal heart sounds.  Pulmonary:     Effort: Pulmonary effort is normal. No respiratory distress.     Breath sounds: Normal breath sounds and air entry.  Chest:     Chest wall: No tenderness.     Comments: No sign of injury to the chest Abdominal:     Palpations: Abdomen is soft.     Tenderness: There is no abdominal tenderness. There is no guarding or rebound.     Comments: No sign of injury to the abdomen  Musculoskeletal: Normal range of motion.     Comments: No midline C/T/L spinal tenderness to palpation, no paraspinal muscle tenderness, no deformity, crepitus, or step-off noted. No sign of injury to the neck or back.  Patient without tenderness of the hips on palpation.  Patient moving all extremities spontaneously and without distress.  Patient is able to lift each leg off the bed quickly with 5/5 strength in bring knee-to-chest without increased pain.  Feet:     Right foot:     Protective Sensation: 3 sites tested. 3 sites sensed.     Left foot:     Protective Sensation: 3 sites tested. 3 sites sensed.  Skin:    General: Skin is warm and dry.     Capillary Refill: Capillary refill takes less than 2 seconds.          Comments: Proximately 12 cm Z shaped laceration.  Bilateral 2+ pitting lower extremity edema, family states is chronic.  Neurological:     Mental Status: She is alert and oriented to person, place, and time.     GCS: GCS eye subscore is 4. GCS verbal subscore is 5. GCS motor subscore is 6.     Comments: Speech is clear and goal oriented, follows commands Major Cranial nerves without deficit, no facial droop Normal strength in upper and lower extremities bilaterally including dorsiflexion and plantar flexion,  strong and equal grip strength Sensation normal to  light touch Moves extremities without ataxia, coordination intact  Psychiatric:        Mood and Affect: Mood normal.        Behavior: Behavior normal.       ED Treatments / Results  Labs (all labs ordered are listed, but only abnormal results are displayed) Labs Reviewed - No data to display  EKG None  Radiology Dg Tibia/fibula Right  Result Date: 08/16/2018 CLINICAL DATA:  Soft tissue wound of the lateral lower leg. EXAM: RIGHT TIBIA AND FIBULA - 2 VIEW COMPARISON:  None. FINDINGS: No evidence of fracture. Soft tissue injury seen of the anterolateral soft tissues in the midportion of the lower leg. No sign of radiopaque foreign object. Patient does have some ordinary vascular calcifications. IMPRESSION: Soft tissue injury. No bony abnormality. No radiopaque foreign object. Electronically Signed   By: Nelson Chimes M.D.   On: 08/16/2018 11:35    Procedures .Marland KitchenLaceration Repair Date/Time: 08/16/2018 1:11 PM Performed by: Deliah Boston, PA-C Authorized by: Deliah Boston, PA-C   Consent:    Consent obtained:  Verbal   Consent given by:  Patient (and granddaughter)   Risks discussed:  Infection, pain, retained foreign body, tendon damage, vascular damage, poor wound healing, poor cosmetic result, nerve damage and need for additional repair Anesthesia (see MAR for exact dosages):    Anesthesia method:  Local infiltration   Local anesthetic:  Lidocaine 1% w/o epi Laceration details:    Location:  Leg   Leg location:  R lower leg   Length (cm):  12   Depth (mm):  10 Repair type:    Repair type:  Intermediate Pre-procedure details:    Preparation:  Patient was prepped and draped in usual sterile fashion and imaging obtained to evaluate for foreign bodies Exploration:    Wound exploration: wound explored through full range of motion and entire depth of wound probed and visualized     Wound extent: no foreign bodies/material noted, no muscle damage noted, no nerve damage  noted, no tendon damage noted, no underlying fracture noted and no vascular damage noted     Contaminated: no   Treatment:    Area cleansed with:  Betadine and Shur-Clens   Amount of cleaning:  Extensive   Irrigation solution:  Sterile saline   Irrigation volume:  1.5L   Irrigation method:  Pressure wash Skin repair:    Repair method:  Sutures   Suture size:  3-0   Suture material:  Prolene   Suture technique:  Simple interrupted   Number of sutures:  8 Approximation:    Approximation:  Close Post-procedure details:    Dressing:  Non-adherent dressing and antibiotic ointment   Patient tolerance of procedure:  Tolerated well, no immediate complications Comments:     Laceration repair complicated by chronic bilateral lower extremity lymphedema.  Serous drainage/swelling present.  Wound was approximated adequately still thin serous fluid leaking right laceration site.  Will apply sterile dressing and Ace wrap to help with swelling as per Dr. Ralene Bathe.   (including critical care time)  Medications Ordered in ED Medications  lidocaine (PF) (XYLOCAINE) 1 % injection 30 mL (30 mLs Infiltration Given by Other 08/16/18 1232)     Initial Impression / Assessment and Plan / ED Course  I have reviewed the triage vital signs and the nursing notes.  Pertinent labs & imaging results that were available during my care of the patient were reviewed by  me and considered in my medical decision making (see chart for details).    85 year old female presented with her family member for laceration of the right lower extremity while walking around her house.  Patient struck lower leg on a plastic container.  She denies fall, loss consciousness or any other injury.  Bleeding controlled prior to arrival.  Patient without other signs of injury today without other complaints.  Of note patient with chronic bilateral lower extremity edema.  Bilateral extremities neurovascularly intact; no signs of infection, septic  joint, DVT, compartment syndrome. Patient with full range of motion for age/body habitus and 5/5 strength with all movements.  Of note patient is unable to have Tdap updated today due to allergy.  DG tib-fib right obtained:  IMPRESSION:  Soft tissue injury. No bony abnormality. No radiopaque foreign  object.   Patient seen and evaluated by Dr. Ralene Bathe who advises suture repair of wound today, ACE wrap for swelling and referral to Wound Clinic; no antibiotics indicated at this time.  Wound thoroughly cleaned in ED today. Wound explored and bottom of wound seen in a bloodless field. Laceration repaired as dictated above. Patient counseled on home wound care.  Patient given referral to wound clinic, encouraged to call their office today to schedule appointment for next week.  Additionally patient encouraged to follow-up with primary care provider next week.  I discussed the potential for delayed wound healing with patient and daughter/granddaughter at bedside.  They state understanding to have patient follow-up with wound care clinic as well as primary care provider next week as suture removal timeline may be altered due to patient's edema.  Discussed rest, elevation and compression for edema.  Patient and family was urged to return to the Emergency Department for worsening pain, swelling, expanding erythema especially if it streaks away from the affected area, fever, or for any additional concerns.  At this time there does not appear to be any evidence of an acute emergency medical condition and the patient appears stable for discharge with appropriate outpatient follow up. Diagnosis was discussed with patient and family who verbalizes understanding of care plan and is agreeable to discharge. I have discussed return precautions with patient and family who verbalize understanding of return precautions. Patient and family strongly encouraged to follow-up with their PCP and wound clinic next week All  questions answered.  Patient's case rediscussed with Dr. Ralene Bathe who agrees with plan to discharge with follow-up.   Note: Portions of this report may have been transcribed using voice recognition software. Every effort was made to ensure accuracy; however, inadvertent computerized transcription errors may still be present. Final Clinical Impressions(s) / ED Diagnoses   Final diagnoses:  Laceration of right lower extremity, initial encounter    ED Discharge Orders    None       Gari Crown 08/16/18 1522    Quintella Reichert, MD 08/17/18 [phone]

## 2018-08-22 ENCOUNTER — Encounter (HOSPITAL_COMMUNITY): Payer: Self-pay | Admitting: Emergency Medicine

## 2018-08-22 ENCOUNTER — Ambulatory Visit (HOSPITAL_COMMUNITY)
Admission: EM | Admit: 2018-08-22 | Discharge: 2018-08-22 | Disposition: A | Discharge status: Home / Self Care | Payer: Medicare HMO

## 2018-08-22 ENCOUNTER — Other Ambulatory Visit: Payer: Self-pay

## 2018-08-22 DIAGNOSIS — Z5189 Encounter for other specified aftercare: Secondary | ICD-10-CM

## 2018-08-22 DIAGNOSIS — Z4802 Encounter for removal of sutures: Secondary | ICD-10-CM

## 2018-08-22 MED ORDER — BACITRACIN ZINC 500 UNIT/GM EX OINT
TOPICAL_OINTMENT | CUTANEOUS | Status: AC
  Filled 2018-08-22: qty 0.9

## 2018-08-22 MED ORDER — BACITRACIN ZINC 500 UNIT/GM EX OINT
TOPICAL_OINTMENT | CUTANEOUS | Status: AC
  Filled 2018-08-22: qty 4.5

## 2018-08-22 NOTE — ED Notes (Signed)
8 sutures removed from right lower leg, wound dressed with bacitracin, non adherent pad, and wrapped

## 2018-08-22 NOTE — ED Triage Notes (Signed)
PT was seen in [facility] 1/17 and had a laceration to right leg sutured that day. PT was told to follow up with the wound clinic, she spoke to them Tuesday and was told she did not need to follow up there. PT is here for a wound check.

## 2018-08-22 NOTE — Discharge Instructions (Addendum)
The wound on the right lower leg appears to be healing well.  We removed the stitches today and placed some bacitracin ointment on the wound. You can keep it covered if you would like for the next couple days.  I believe it is okay to wear your TED hose. Follow up as needed for continued or worsening symptoms

## 2018-08-23 NOTE — ED Provider Notes (Signed)
[place]    CSN: [id number] Arrival date & time: 08/22/18  1303     History   Chief Complaint Chief Complaint  Patient presents with  . Wound Check    HPI [name] is a 85 y.o. female.   Patient is an 85 year old female that presents for wound check to the right lower extremity.  She was seen in the ER on 08/16/2018 and had sutures placed in the leg from an injury.  She is here to have the wound looked at and possible suture removal.  She denies any pain in the leg, warmth, redness or swelling.  She denies any drainage or foul smell.  Leg is wrapped upon arrival.  She has not changed the dressing since being placed in the ER.  She denies any fevers, chills, myalgias.  ROS per HPI      Past Medical History:  Diagnosis Date  . CAP (community acquired pneumonia) 09/21/2015  . Chronic kidney disease, stage III (moderate) (HCC)   . Encounter for long-term (current) use of other medications   . Glaucoma (increased eye pressure)   . Head pain   . Hypertension   . Impaired glucose tolerance test   . Insomnia, unspecified   . Morbid obesity ([place])   . Neck pain   . Osteoarthrosis, unspecified whether generalized or localized, other specified sites   . Other lymphedema   . Pain in joint, multiple sites   . Primary open-angle glaucoma(365.11)     Patient Active Problem List   Diagnosis Date Noted  . Mitral valve insufficiency 03/19/2017  . Other spondylosis with radiculopathy, cervical region 03/14/2017  . Cerumen impaction 12/04/2016  . Generalized weakness 09/22/2015  . Acidosis 09/22/2015  . Acute encephalopathy 09/22/2015  . Hyperglycemia 09/22/2015  . Leukopenia 09/22/2015  . CAP (community acquired pneumonia) 09/22/2015  . Bilateral leg edema 09/22/2015  . Constipation 09/22/2015  . Snoring 09/13/2015  . Glaucoma (increased eye pressure)   . Hypertension   . Neck pain   . Head pain   . Pain in joint, multiple sites   . Impaired glucose  tolerance test   . Osteoarthrosis, unspecified whether generalized or localized, other specified sites   . Morbid obesity ([place])   . Encounter for long-term (current) use of other medications   . Other lymphedema   . Insomnia, unspecified   . Chronic kidney disease, stage III (moderate) (HCC)   . Primary open-angle glaucoma(365.11)     Past Surgical History:  Procedure Laterality Date  . ABDOMINAL HYSTERECTOMY    . EYE SURGERY      OB History   No obstetric history on file.      Home Medications    Prior to Admission medications   Medication Sig Start Date End Date Taking? Authorizing Provider  ACAI PO Take 1 capsule by mouth 2 (two) times daily.    [provider]  alendronate (FOSAMAX) 35 MG tablet Take 1 tablet (35 mg total) by mouth every 7 (seven) days. Take with a full glass of water on an empty stomach. Patient taking differently: Take 35 mg by mouth every Sunday. Take with a full glass of water on an empty stomach. 04/24/18   Clent Demark, PA-C  amLODipine (NORVASC) 2.5 MG tablet Take 1 tablet (2.5 mg total) by mouth daily. Patient not taking: Reported on 08/16/2018 11/19/17   Clent Demark, PA-C  aspirin EC 81 MG tablet Take 1 tablet (81 mg total) by mouth daily. 03/26/17  Clent Demark, PA-C  brimonidine [facility]) 0.15 % ophthalmic solution Place 1 drop into the left eye 2 (two) times daily.  10/29/12   [provider]  clindamycin (CLEOCIN) 150 MG capsule Take 150 mg by mouth daily.  07/10/18   [provider]  [facility] VITAMIN C 500 MG tablet Take 500 mg by mouth daily. 06/24/18   [provider]  Cyanocobalamin (VITAMIN B-12 PO) Take 1 tablet by mouth daily.    [provider]  docusate sodium (COLACE) 50 MG capsule Take 1 capsule (50 mg total) by mouth 2 (two) times daily. Patient not taking: Reported on 08/16/2018 03/26/17   Clent Demark, PA-C  doxycycline (MONODOX) 100 MG capsule Take 100 mg by mouth daily.   07/08/18   [provider]  furosemide (LASIX) 20 MG tablet Take 1 tablet (20 mg total) by mouth daily. 02/18/18   Clent Demark, PA-C  gabapentin (NEURONTIN) 100 MG capsule TAKE 1 CAPSULE (100 MG TOTAL) BY MOUTH AT BEDTIME. 06/12/18   Clent Demark, PA-C  GARLIC PO Take 7,681 mg by mouth daily.     [provider]  hydrochlorothiazide (HYDRODIURIL) 25 MG tablet Take 1 tablet (25 mg total) by mouth daily. 06/18/18   Clent Demark, PA-C  losartan (COZAAR) 100 MG tablet Take 1 tablet (100 mg total) by mouth daily. 06/18/18   Clent Demark, PA-C  Melatonin 5 MG TABS Take 1 tablet (5 mg total) by mouth at bedtime. Patient not taking: Reported on 08/16/2018 09/20/17   Clent Demark, PA-C  Multiple Vitamin (MULTIVITAMIN WITH MINERALS) TABS Take 1 tablet by mouth daily.    [provider]  Polyethyl Glycol-Propyl Glycol 0.4-0.3 % SOLN Place 1 drop into both eyes 3 (three) times daily as needed (for lubricating eyes).    [provider]  prednisoLONE acetate (PRED FORTE) 1 % ophthalmic suspension Place 1 drop into the left eye daily.  10/29/12   [provider]  spironolactone (ALDACTONE) 25 MG tablet Take 1 tablet (25 mg total) by mouth daily. 11/19/17   Clent Demark, PA-C    Family History Family History  Problem Relation Age of Onset  . Cancer Father     Social History Social History   Tobacco Use  . Smoking status: Never Smoker  . Smokeless tobacco: Never Used  Substance Use Topics  . Alcohol use: No    Alcohol/week: 0.0 standard drinks  . Drug use: No     Allergies   Penicillins and Tetanus toxoid   Review of Systems Review of Systems   Physical Exam Triage Vital Signs ED Triage Vitals  Enc Vitals Group     BP 08/22/18 1530 (!) 140/53     Pulse Rate 08/22/18 1530 (!) 54     Resp 08/22/18 1530 16     Temp 08/22/18 1530 98.1 F (36.7 C)     Temp Source 08/22/18 1530 Oral     SpO2 08/22/18 1530 99 %      Weight --      Height --      Head Circumference --      Peak Flow --      Pain Score 08/22/18 1532 0     Pain Loc --      Pain Edu? --      Excl. in [place]? --    No data found.  Updated Vital Signs BP (!) 140/53   Pulse (!) 54   Temp 98.1 F (  36.7 C) (Oral)   Resp 16   SpO2 99%   Visual Acuity Right Eye Distance:   Left Eye Distance:   Bilateral Distance:    Right Eye Near:   Left Eye Near:    Bilateral Near:     Physical Exam Vitals signs and nursing note reviewed.  Constitutional:      General: She is not in acute distress.    Appearance: She is well-developed.  HENT:     Head: Normocephalic and atraumatic.  Eyes:     Conjunctiva/sclera: Conjunctivae normal.  Neck:     Musculoskeletal: Neck supple.  Cardiovascular:     Rate and Rhythm: Normal rate and regular rhythm.     Heart sounds: No murmur.  Pulmonary:     Effort: Pulmonary effort is normal. No respiratory distress.     Breath sounds: Normal breath sounds.  Abdominal:     Palpations: Abdomen is soft.     Tenderness: There is no abdominal tenderness.  Musculoskeletal: Normal range of motion.  Skin:    General: Skin is warm and dry.     Comments: Wound to the right lower extremity appears well-healed.  Sutures intact.  There is no surrounding erythema, swelling or drainage. Nontender to touch.  Neurological:     Mental Status: She is alert.      UC Treatments / Results  Labs (all labs ordered are listed, but only abnormal results are displayed) Labs Reviewed - No data to display  EKG None  Radiology No results found.  Procedures Procedures (including critical care time)  Medications Ordered in UC Medications - No data to display  Initial Impression / Assessment and Plan / UC Course  I have reviewed the triage vital signs and the nursing notes.  Pertinent labs & imaging results that were available during my care of the patient were reviewed by me and considered in my medical decision  making (see chart for details).     Wound is well-healed.  We will go ahead and remove the sutures and place a small dressing. Instructed that it is safe to wear her TED hose at this time.  For the swelling in her lower extremities. There is no need for further follow-up Patient understanding and agreed to plan Final Clinical Impressions(s) / UC Diagnoses   Final diagnoses:  Visit for wound check  Visit for suture removal     Discharge Instructions     The wound on the right lower leg appears to be healing well.  We removed the stitches today and placed some bacitracin ointment on the wound. You can keep it covered if you would like for the next couple days.  I believe it is okay to wear your TED hose. Follow up as needed for continued or worsening symptoms     ED Prescriptions    None     Controlled Substance Prescriptions [place] Controlled Substance Registry consulted? no   Orvan July, NP 08/23/18 [phone]

## 2018-08-27 ENCOUNTER — Telehealth (INDEPENDENT_AMBULATORY_CARE_PROVIDER_SITE_OTHER): Payer: Self-pay | Admitting: Physician Assistant

## 2018-08-27 NOTE — Telephone Encounter (Signed)
Patient called to request a handicap placard. Patient states she is having difficulty walking. Patient has an appoint on 08/29/18 @2 :50pm  Please advice [phone]  Thank you [name]

## 2018-08-28 ENCOUNTER — Ambulatory Visit: Payer: Medicare HMO | Admitting: Family Medicine

## 2018-08-29 ENCOUNTER — Inpatient Hospital Stay (INDEPENDENT_AMBULATORY_CARE_PROVIDER_SITE_OTHER): Payer: Medicare HMO | Admitting: Family Medicine

## 2018-08-29 ENCOUNTER — Encounter (INDEPENDENT_AMBULATORY_CARE_PROVIDER_SITE_OTHER): Payer: Self-pay | Admitting: Family Medicine

## 2018-08-29 ENCOUNTER — Other Ambulatory Visit: Payer: Self-pay

## 2018-08-29 ENCOUNTER — Ambulatory Visit (INDEPENDENT_AMBULATORY_CARE_PROVIDER_SITE_OTHER): Payer: Medicare HMO | Admitting: Family Medicine

## 2018-08-29 VITALS — BP 122/69 | HR 80 | Temp 97.8°F | Ht <= 58 in | Wt 149.4 lb

## 2018-08-29 DIAGNOSIS — R202 Paresthesia of skin: Secondary | ICD-10-CM

## 2018-08-29 DIAGNOSIS — I10 Essential (primary) hypertension: Secondary | ICD-10-CM

## 2018-08-29 DIAGNOSIS — S81811D Laceration without foreign body, right lower leg, subsequent encounter: Secondary | ICD-10-CM | POA: Diagnosis not present

## 2018-08-29 DIAGNOSIS — M25552 Pain in left hip: Secondary | ICD-10-CM

## 2018-08-29 DIAGNOSIS — H16002 Unspecified corneal ulcer, left eye: Secondary | ICD-10-CM

## 2018-08-29 DIAGNOSIS — R7303 Prediabetes: Secondary | ICD-10-CM

## 2018-08-29 LAB — POCT GLYCOSYLATED HEMOGLOBIN (HGB A1C): Hemoglobin A1C: 6 % — AB (ref 4.0–5.6)

## 2018-08-29 MED ORDER — GABAPENTIN 100 MG PO CAPS: 100.0000 mg | ORAL_CAPSULE | Freq: Every day | ORAL | 0 refills | Status: DC

## 2018-08-29 NOTE — Progress Notes (Signed)
Subjective:  Patient ID: [name], female    DOB: 1930-06-29  Age: 85 y.o. MRN: [id number]  CC: Hospitalization Follow-up (laceration of right lower extremity )   HPI [name] is an 85 year old female with a history of hypertension who presents today for follow-up visit after sustaining laceration to her right leg for which sutures were placed at the ED and later removed 1 week ago. She does have chronic lymphedema and wears compression stockings. Seen by [facility] ophthalmology for the left eye corneal ulcer which has healed; she has completed her course of oral antibiotics and last visit was 3 weeks ago when she was advised to follow-up as needed.  She complains of left hip pain which she rates as a 6/10 and improves with Tylenol.  Also has intermittent numbness in her right hand to the point where she sometimes drops things.  She denies neck pain but C-spine x-ray from 02/2017 revealed cervical kyphosis, disc space narrowing from C3-C7 with mild endplate spurring. A1c today 6.0. She would like handicap form completed.  Past Medical History:  Diagnosis Date  . CAP (community acquired pneumonia) 09/21/2015  . Chronic kidney disease, stage III (moderate) (HCC)   . Encounter for long-term (current) use of other medications   . Glaucoma (increased eye pressure)   . Head pain   . Hypertension   . Impaired glucose tolerance test   . Insomnia, unspecified   . Morbid obesity ([place])   . Neck pain   . Osteoarthrosis, unspecified whether generalized or localized, other specified sites   . Other lymphedema   . Pain in joint, multiple sites   . Primary open-angle glaucoma(365.11)     Past Surgical History:  Procedure Laterality Date  . ABDOMINAL HYSTERECTOMY    . EYE SURGERY      Allergies  Allergen Reactions  . Penicillins Hives    Has patient had a PCN reaction causing immediate rash, facial/tongue/throat swelling, SOB or lightheadedness with  hypotension: No Has patient had a PCN reaction causing severe rash involving mucus membranes or skin necrosis: No Has patient had a PCN reaction that required hospitalization No Has patient had a PCN reaction occurring within the last 10 years: No If all of the above answers are "NO", then may proceed with Cephalosporin use.  . Tetanus Toxoid Hives     Outpatient Medications Prior to Visit  Medication Sig Dispense Refill  . ACAI PO Take 1 capsule by mouth 2 (two) times daily.    Marland Kitchen alendronate (FOSAMAX) 35 MG tablet Take 1 tablet (35 mg total) by mouth every 7 (seven) days. Take with a full glass of water on an empty stomach. (Patient taking differently: Take 35 mg by mouth every Sunday. Take with a full glass of water on an empty stomach.) 12 tablet 3  . amLODipine (NORVASC) 2.5 MG tablet Take 1 tablet (2.5 mg total) by mouth daily. 90 tablet 3  . aspirin EC 81 MG tablet Take 1 tablet (81 mg total) by mouth daily. 90 tablet 3  . brimonidine (ALPHAGAN) 0.15 % ophthalmic solution Place 1 drop into the left eye 2 (two) times daily.     . clindamycin (CLEOCIN) 150 MG capsule Take 150 mg by mouth daily.     . [facility]S VITAMIN C 500 MG tablet Take 500 mg by mouth daily.    . Cyanocobalamin (VITAMIN B-12 PO) Take 1 tablet by mouth daily.    Marland Kitchen docusate sodium (COLACE) 50 MG capsule Take  1 capsule (50 mg total) by mouth 2 (two) times daily. 10 capsule 0  . doxycycline (MONODOX) 100 MG capsule Take 100 mg by mouth daily.     . furosemide (LASIX) 20 MG tablet Take 1 tablet (20 mg total) by mouth daily. 90 tablet 2  . GARLIC PO Take 2,620 mg by mouth daily.     . hydrochlorothiazide (HYDRODIURIL) 25 MG tablet Take 1 tablet (25 mg total) by mouth daily. 90 tablet 3  . losartan (COZAAR) 100 MG tablet Take 1 tablet (100 mg total) by mouth daily. 90 tablet 3  . Melatonin 5 MG TABS Take 1 tablet (5 mg total) by mouth at bedtime. 30 tablet 3  . Multiple Vitamin (MULTIVITAMIN WITH MINERALS) TABS Take 1 tablet by  mouth daily.    Vladimir Faster Glycol-Propyl Glycol 0.4-0.3 % SOLN Place 1 drop into both eyes 3 (three) times daily as needed (for lubricating eyes).    . prednisoLONE acetate (PRED FORTE) 1 % ophthalmic suspension Place 1 drop into the left eye daily.     Marland Kitchen spironolactone (ALDACTONE) 25 MG tablet Take 1 tablet (25 mg total) by mouth daily. 90 tablet 3  . gabapentin (NEURONTIN) 100 MG capsule TAKE 1 CAPSULE (100 MG TOTAL) BY MOUTH AT BEDTIME. 90 capsule 0   No facility-administered medications prior to visit.     ROS Review of Systems  Constitutional: Negative for activity change, appetite change and fatigue.  HENT: Negative for congestion, sinus pressure and sore throat.   Eyes: Negative for visual disturbance.  Respiratory: Negative for cough, chest tightness, shortness of breath and wheezing.   Cardiovascular: Negative for chest pain and palpitations.  Gastrointestinal: Negative for abdominal distention, abdominal pain and constipation.  Endocrine: Negative for polydipsia.  Genitourinary: Negative for dysuria and frequency.  Musculoskeletal: Negative for arthralgias and back pain.  Skin: Negative for rash.  Neurological: Positive for numbness. Negative for tremors and light-headedness.  Hematological: Does not bruise/bleed easily.  Psychiatric/Behavioral: Negative for agitation and behavioral problems.    Objective:  BP 122/69 (BP Location: Left Arm, Patient Position: Sitting, Cuff Size: Normal)   Pulse 80   Temp 97.8 F (36.6 C) (Oral)   Ht 4\' 10"  (1.473 m)   Wt 149 lb 6.4 oz (67.8 kg)   SpO2 99%   BMI 31.22 kg/m   BP/Weight 08/29/2018 08/22/2018 3/55/9741  Systolic BP 638 453 646  Diastolic BP 69 53 59  Wt. (Lbs) 149.4 - -  BMI 31.22 - -      Physical Exam Constitutional:      Appearance: She is well-developed.  Cardiovascular:     Rate and Rhythm: Normal rate.     Heart sounds: Normal heart sounds. No murmur.  Pulmonary:     Effort: Pulmonary effort is normal.      Breath sounds: Normal breath sounds. No wheezing or rales.  Chest:     Chest wall: No tenderness.  Abdominal:     General: Bowel sounds are normal. There is no distension.     Palpations: Abdomen is soft. There is no mass.     Tenderness: There is no abdominal tenderness.  Musculoskeletal: Normal range of motion.     Right lower leg: Edema present.     Left lower leg: Edema present.     Comments: Slight tenderness in left hip on range of motion   Skin:    Comments: Laceration of right leg with no discharge, not tender, no evidence of infection  Neurological:  Mental Status: She is alert and oriented to person, place, and time.  Psychiatric:        Mood and Affect: Mood normal.      Lab Results  Component Value Date   HGBA1C 6.0 (A) 08/29/2018     Assessment & Plan:   1. Prediabetes Controlled with A1c of 6.0 Continue current management, diabetic diet, lifestyle modifications to prevent development of diabetes mellitus  2. Ulcer of left cornea Released by [facility] ophthalmology Patient to follow-up as needed  3. Tingling We will need to rule out cervical radiculopathy if persisting - gabapentin (NEURONTIN) 100 MG capsule; Take 1 capsule (100 mg total) by mouth at bedtime.  Dispense: 90 capsule; Refill: 0  4. Laceration of right lower leg, subsequent encounter Dressing change applied in the clinic with bacitracin ointment.  5. Pain of left hip joint Likely underlying osteoarthritis Symptoms are controlled on Tylenol and I have advised her to continue with this We will hold off on muscle relaxant as she is a senior and at high risk for falls and she understands this decision  6. Essential hypertension Controlled Continue current regimen   Meds ordered this encounter  Medications  . gabapentin (NEURONTIN) 100 MG capsule    Sig: Take 1 capsule (100 mg total) by mouth at bedtime.    Dispense:  90 capsule    Refill:  0    Follow-up: Return in about 3 months  (around 11/28/2018) for follow up of chronic medical conditions.   Charlott Rakes MD

## 2018-08-29 NOTE — Progress Notes (Signed)
Pt complains of pain in left hip, numbness in her neck and fingers on right hand Pt complains of muscle spasms  Pt wants her left eye checked

## 2018-09-02 ENCOUNTER — Telehealth (INDEPENDENT_AMBULATORY_CARE_PROVIDER_SITE_OTHER): Payer: Self-pay | Admitting: Physician Assistant

## 2018-09-02 NOTE — Telephone Encounter (Signed)
Patient called wanting PCP to give her a call in regard to information on how to treat her wound on her leg.  Please advice [phone]  Thank you Emmit Pomfret

## 2018-09-03 MED ORDER — BACITRACIN 500 UNIT/GM EX OINT: 1.0000 "application " | TOPICAL_OINTMENT | Freq: Two times a day (BID) | CUTANEOUS | 0 refills | Status: DC

## 2018-09-03 NOTE — Telephone Encounter (Signed)
Please advise. Missy Baksh S Veeda Virgo, CMA  

## 2018-09-03 NOTE — Telephone Encounter (Signed)
I have sent a prescription for bacitracin to her pharmacy which she can use on the wound with gauze dressing over it.  Please schedule her an upcoming appointment with Sharyn Lull in 2 weeks for follow-up.  Thank you

## 2018-09-05 NOTE — Telephone Encounter (Signed)
Patient is aware that bacitracin was sent to her pharmacy to use on her wound with gauze dressing over it. She will call to schedule appointment with new provider. [name], CMA

## 2018-10-02 ENCOUNTER — Ambulatory Visit (INDEPENDENT_AMBULATORY_CARE_PROVIDER_SITE_OTHER): Payer: Medicare HMO | Admitting: Primary Care

## 2018-10-03 ENCOUNTER — Ambulatory Visit (INDEPENDENT_AMBULATORY_CARE_PROVIDER_SITE_OTHER): Payer: Medicare HMO | Admitting: Primary Care

## 2018-10-14 ENCOUNTER — Encounter (INDEPENDENT_AMBULATORY_CARE_PROVIDER_SITE_OTHER): Payer: Self-pay | Admitting: Primary Care

## 2018-10-14 ENCOUNTER — Other Ambulatory Visit: Payer: Self-pay

## 2018-10-14 ENCOUNTER — Ambulatory Visit (INDEPENDENT_AMBULATORY_CARE_PROVIDER_SITE_OTHER): Payer: Medicare HMO | Admitting: Primary Care

## 2018-10-14 VITALS — BP 128/69 | HR 65 | Temp 97.4°F | Ht <= 58 in | Wt 144.4 lb

## 2018-10-14 DIAGNOSIS — E782 Mixed hyperlipidemia: Secondary | ICD-10-CM

## 2018-10-14 DIAGNOSIS — N183 Chronic kidney disease, stage 3 unspecified: Secondary | ICD-10-CM

## 2018-10-14 DIAGNOSIS — I129 Hypertensive chronic kidney disease with stage 1 through stage 4 chronic kidney disease, or unspecified chronic kidney disease: Secondary | ICD-10-CM | POA: Diagnosis not present

## 2018-10-14 DIAGNOSIS — R531 Weakness: Secondary | ICD-10-CM

## 2018-10-14 DIAGNOSIS — R600 Localized edema: Secondary | ICD-10-CM | POA: Diagnosis not present

## 2018-10-14 DIAGNOSIS — S81801A Unspecified open wound, right lower leg, initial encounter: Secondary | ICD-10-CM

## 2018-10-14 DIAGNOSIS — G4700 Insomnia, unspecified: Secondary | ICD-10-CM

## 2018-10-14 DIAGNOSIS — I10 Essential (primary) hypertension: Secondary | ICD-10-CM

## 2018-10-14 MED ORDER — AMLODIPINE BESYLATE 2.5 MG PO TABS: 2.5000 mg | ORAL_TABLET | Freq: Every day | ORAL | 3 refills | Status: DC

## 2018-10-14 MED ORDER — MELATONIN 5 MG PO TABS: 1.0000 | ORAL_TABLET | Freq: Every day | ORAL | 3 refills | Status: DC

## 2018-10-14 MED ORDER — LOSARTAN POTASSIUM 100 MG PO TABS: 100.0000 mg | ORAL_TABLET | Freq: Every day | ORAL | 3 refills | Status: DC

## 2018-10-14 MED ORDER — FUROSEMIDE 20 MG PO TABS: 20.0000 mg | ORAL_TABLET | Freq: Every day | ORAL | 2 refills | Status: DC

## 2018-10-14 MED ORDER — BACITRACIN 500 UNIT/GM EX OINT: 1.0000 "application " | TOPICAL_OINTMENT | Freq: Two times a day (BID) | CUTANEOUS | 0 refills | Status: DC

## 2018-10-14 MED ORDER — HYDROCHLOROTHIAZIDE 25 MG PO TABS: 25.0000 mg | ORAL_TABLET | Freq: Every day | ORAL | 3 refills | Status: DC

## 2018-10-14 NOTE — Progress Notes (Addendum)
Established Patient Office Visit  Subjective:  Patient ID: [name], female    DOB: 1930-06-07  Age: 85 y.o. MRN: [id number]  CC: No chief complaint on file.   HPI [name] presents for wound check . She has a lot of psychosocial issues involving living environment. Fears that if social services comes in to help they may not allowed her to stay in her home. Patient desperately need help with care, ADL's and IADL's she is blind. Wound has not healed since January. PMH chronic lymphedema and wears compression hoses , HTN , venous ulcer nonhealing and CKD.  Past Medical History:  Diagnosis Date  . CAP (community acquired pneumonia) 09/21/2015  . Chronic kidney disease, stage III (moderate) (HCC)   . Encounter for long-term (current) use of other medications   . Glaucoma (increased eye pressure)   . Head pain   . Hypertension   . Impaired glucose tolerance test   . Insomnia, unspecified   . Morbid obesity ([place])   . Neck pain   . Osteoarthrosis, unspecified whether generalized or localized, other specified sites   . Other lymphedema   . Pain in joint, multiple sites   . Primary open-angle glaucoma(365.11)     Past Surgical History:  Procedure Laterality Date  . ABDOMINAL HYSTERECTOMY    . EYE SURGERY      Family History  Problem Relation Age of Onset  . Cancer Father     Social History   Socioeconomic History  . Marital status: Widowed    Spouse name: Not on file  . Number of children: 1  . Years of education: Not on file  . Highest education level: Not on file  Occupational History  . Occupation: retired  Scientific laboratory technician  . Financial resource strain: Not on file  . Food insecurity:    Worry: Not on file    Inability: Not on file  . Transportation needs:    Medical: Not on file    Non-medical: Not on file  Tobacco Use  . Smoking status: Never Smoker  . Smokeless tobacco: Never Used  Substance and Sexual Activity  . Alcohol use: No   Alcohol/week: 0.0 standard drinks  . Drug use: No  . Sexual activity: Not on file  Lifestyle  . Physical activity:    Days per week: Not on file    Minutes per session: Not on file  . Stress: Not on file  Relationships  . Social connections:    Talks on phone: Not on file    Gets together: Not on file    Attends religious service: Not on file    Active member of club or organization: Not on file    Attends meetings of clubs or organizations: Not on file    Relationship status: Not on file  . Intimate partner violence:    Fear of current or ex partner: Not on file    Emotionally abused: Not on file    Physically abused: Not on file    Forced sexual activity: Not on file  Other Topics Concern  . Not on file  Social History Narrative   Patient lives at home alone , widow. Patient is retired and one child. 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    Outpatient Medications Prior to Visit  Medication Sig Dispense Refill  . ACAI PO Take 1 capsule by mouth 2 (two) times daily.    Marland Kitchen alendronate (FOSAMAX) 35 MG  tablet Take 1 tablet (35 mg total) by mouth every 7 (seven) days. Take with a full glass of water on an empty stomach. (Patient taking differently: Take 35 mg by mouth every Sunday. Take with a full glass of water on an empty stomach.) 12 tablet 3  . aspirin EC 81 MG tablet Take 1 tablet (81 mg total) by mouth daily. 90 tablet 3  . brimonidine (ALPHAGAN) 0.15 % ophthalmic solution Place 1 drop into the left eye 2 (two) times daily.     . clindamycin (CLEOCIN) 150 MG capsule Take 150 mg by mouth daily.     . CVS VITAMIN C 500 MG tablet Take 500 mg by mouth daily.    . Cyanocobalamin (VITAMIN B-12 PO) Take 1 tablet by mouth daily.    Marland Kitchen docusate sodium (COLACE) 50 MG capsule Take 1 capsule (50 mg total) by mouth 2 (two) times daily. 10 capsule 0  . doxycycline (MONODOX) 100 MG capsule Take 100 mg by mouth daily.     Marland Kitchen gabapentin (NEURONTIN) 100 MG capsule Take 1  capsule (100 mg total) by mouth at bedtime. 90 capsule 0  . GARLIC PO Take 8,841 mg by mouth daily.     . Multiple Vitamin (MULTIVITAMIN WITH MINERALS) TABS Take 1 tablet by mouth daily.    Vladimir Faster Glycol-Propyl Glycol 0.4-0.3 % SOLN Place 1 drop into both eyes 3 (three) times daily as needed (for lubricating eyes).    . prednisoLONE acetate (PRED FORTE) 1 % ophthalmic suspension Place 1 drop into the left eye daily.     Marland Kitchen amLODipine (NORVASC) 2.5 MG tablet Take 1 tablet (2.5 mg total) by mouth daily. 90 tablet 3  . bacitracin 500 UNIT/GM ointment Apply 1 application topically 2 (two) times daily. 15 g 0  . furosemide (LASIX) 20 MG tablet Take 1 tablet (20 mg total) by mouth daily. 90 tablet 2  . hydrochlorothiazide (HYDRODIURIL) 25 MG tablet Take 1 tablet (25 mg total) by mouth daily. 90 tablet 3  . losartan (COZAAR) 100 MG tablet Take 1 tablet (100 mg total) by mouth daily. 90 tablet 3  . Melatonin 5 MG TABS Take 1 tablet (5 mg total) by mouth at bedtime. 30 tablet 3  . spironolactone (ALDACTONE) 25 MG tablet Take 1 tablet (25 mg total) by mouth daily. 90 tablet 3   No facility-administered medications prior to visit.     Allergies  Allergen Reactions  . Penicillins Hives    Has patient had a PCN reaction causing immediate rash, facial/tongue/throat swelling, SOB or lightheadedness with hypotension: No Has patient had a PCN reaction causing severe rash involving mucus membranes or skin necrosis: No Has patient had a PCN reaction that required hospitalization No Has patient had a PCN reaction occurring within the last 10 years: No If all of the above answers are "NO", then may proceed with Cephalosporin use.  . Tetanus Toxoid Hives    ROS Review of Systems  Constitutional: Negative.   HENT: Negative.   Eyes: Negative.   Respiratory: Negative.   Cardiovascular: Negative.   Gastrointestinal: Negative.   Endocrine: Negative.   Genitourinary: Negative.   Musculoskeletal:  Positive for arthralgias and back pain.  Allergic/Immunologic: Negative.   Neurological: Negative.   Hematological: Negative.   Psychiatric/Behavioral: Negative.       Objective:    Physical Exam  Constitutional: She is oriented to person, place, and time. She appears well-developed and well-nourished.  HENT:  Head: Normocephalic.  Eyes: Pupils are equal, round,  and reactive to light. EOM are normal.  Neck: Normal range of motion. Neck supple.  Cardiovascular: Normal rate and regular rhythm.  Pulmonary/Chest: Effort normal and breath sounds normal.  Abdominal: Soft. Bowel sounds are normal.  Musculoskeletal:        General: Edema present.  Neurological: She is alert and oriented to person, place, and time.  Skin: Skin is warm and dry.  Right lower extremity ? Venous ulcer    Psychiatric: She has a normal mood and affect.    Temp (!) 97.4 F (36.3 C)   Ht 4\' 10"  (1.473 m)   Wt 144 lb 6.4 oz (65.5 kg)   SpO2 100%   BMI 30.18 kg/m  Wt Readings from Last 3 Encounters:  10/14/18 144 lb 6.4 oz (65.5 kg)  08/29/18 149 lb 6.4 oz (67.8 kg)  05/29/18 155 lb 6.4 oz (70.5 kg)    Lab Results  Component Value Date   TSH 1.440 01/02/2017   Lab Results  Component Value Date   WBC 3.1 (L) 10/14/2018   HGB 11.1 10/14/2018   HCT 35.8 10/14/2018   MCV 83 10/14/2018   PLT 225 10/14/2018   Lab Results  Component Value Date   NA 140 10/14/2018   K 4.7 10/14/2018   CO2 22 10/14/2018   GLUCOSE 92 10/14/2018   BUN 43 (H) 10/14/2018   CREATININE 1.81 (H) 10/14/2018   BILITOT 0.5 10/14/2018   ALKPHOS 82 10/14/2018   AST 18 10/14/2018   ALT 11 10/14/2018   PROT 7.0 10/14/2018   ALBUMIN 4.1 10/14/2018   CALCIUM 9.9 10/14/2018   ANIONGAP 4 (L) 11/06/2016   Lab Results  Component Value Date   CHOL 150 10/14/2018   Lab Results  Component Value Date   HDL 84 10/14/2018   Lab Results  Component Value Date   LDLCALC 57 10/14/2018   Lab Results  Component Value Date    TRIG 43 10/14/2018   Lab Results  Component Value Date   CHOLHDL 1.8 10/14/2018   Lab Results  Component Value Date   HGBA1C 6.0 (A) 08/29/2018      Assessment & Plan:  Diagnoses and all orders for this visit:  Wound of right lower extremity, initial encounter -     AMB referral to wound care center  Generalized weakness arthralgia   Chronic kidney disease, stage III (moderate) (HCC) Creatinine and GFR increased stop HCTZ   Essential hypertension -     furosemide (LASIX) 20 MG tablet; Take 1 tablet (20 mg total) by mouth daily. -     CBC with Differential  Hypertension, unspecified type -     amLODipine (NORVASC) 2.5 MG tablet; Take 1 tablet (2.5 mg total) by mouth daily. -     Discontinue: hydrochlorothiazide (HYDRODIURIL) 25 MG tablet; Take 1 tablet (25 mg total) by mouth daily. -     Discontinue: losartan (COZAAR) 100 MG tablet; Take 1 tablet (100 mg total) by mouth daily. -     Comprehensive metabolic panel -     spironolactone (ALDACTONE) 25 MG tablet; Take 1 tablet (25 mg total) by mouth daily.  Lower extremity edema -     furosemide (LASIX) 20 MG tablet; Take 1 tablet (20 mg total) by mouth daily.  Insomnia, unspecified type -     Melatonin 5 MG TABS; Take 1 tablet (5 mg total) by mouth at bedtime.  Mixed hyperlipidemia -     Lipid Panel  Other orders -     bacitracin  500 UNIT/GM ointment; Apply 1 application topically 2 (two) times daily.   Problem List Items Addressed This Visit    Hypertension   Relevant Medications   amLODipine (NORVASC) 2.5 MG tablet   furosemide (LASIX) 20 MG tablet   spironolactone (ALDACTONE) 25 MG tablet   Other Relevant Orders   CBC with Differential (Completed)   Comprehensive metabolic panel (Completed)   Insomnia, unspecified   Relevant Medications   Melatonin 5 MG TABS   Chronic kidney disease, stage III (moderate) (HCC)   Generalized weakness    Other Visit Diagnoses    Wound of right lower extremity, initial  encounter    -  Primary   Venous ulcer    AMB referral to wound care center   Lower extremity edema       spirolactone    furosemide (LASIX) 20 MG tablet   Mixed hyperlipidemia       Other Relevant Orders   Lipid Panel (Completed)      Meds ordered this encounter  Medications  . amLODipine (NORVASC) 2.5 MG tablet    Sig: Take 1 tablet (2.5 mg total) by mouth daily.    Dispense:  90 tablet    Refill:  3  . bacitracin 500 UNIT/GM ointment    Sig: Apply 1 application topically 2 (two) times daily.    Dispense:  15 g    Refill:  0  . furosemide (LASIX) 20 MG tablet    Sig: Take 1 tablet (20 mg total) by mouth daily.    Dispense:  90 tablet    Refill:  2  . hydrochlorothiazide (HYDRODIURIL) 25 MG tablet    Sig: Take 1 tablet (25 mg total) by mouth daily.    Dispense:  90 tablet    Refill:  3  . losartan (COZAAR) 100 MG tablet    Sig: Take 1 tablet (100 mg total) by mouth daily.    Dispense:  90 tablet    Refill:  3  . Melatonin 5 MG TABS    Sig: Take 1 tablet (5 mg total) by mouth at bedtime.    Dispense:  30 tablet    Refill:  3    Follow-up: Return in about 6 months (around 04/16/2019) for HTN.    Kerin Perna, NP

## 2018-10-15 ENCOUNTER — Other Ambulatory Visit (INDEPENDENT_AMBULATORY_CARE_PROVIDER_SITE_OTHER): Payer: Self-pay | Admitting: Primary Care

## 2018-10-15 LAB — CBC WITH DIFFERENTIAL/PLATELET
BASOS: 0 %
Basophils Absolute: 0 10*3/uL (ref 0.0–0.2)
EOS (ABSOLUTE): 0.1 10*3/uL (ref 0.0–0.4)
EOS: 4 %
HEMATOCRIT: 35.8 % (ref 34.0–46.6)
HEMOGLOBIN: 11.1 g/dL (ref 11.1–15.9)
Immature Grans (Abs): 0 10*3/uL (ref 0.0–0.1)
Immature Granulocytes: 0 %
LYMPHS ABS: 1.2 10*3/uL (ref 0.7–3.1)
Lymphs: 40 %
MCH: 25.8 pg — AB (ref 26.6–33.0)
MCHC: 31 g/dL — AB (ref 31.5–35.7)
MCV: 83 fL (ref 79–97)
MONOCYTES: 7 %
Monocytes Absolute: 0.2 10*3/uL (ref 0.1–0.9)
NEUTROS ABS: 1.5 10*3/uL (ref 1.4–7.0)
Neutrophils: 49 %
Platelets: 225 10*3/uL (ref 150–450)
RBC: 4.3 x10E6/uL (ref 3.77–5.28)
RDW: 14.3 % (ref 11.7–15.4)
WBC: 3.1 10*3/uL — ABNORMAL LOW (ref 3.4–10.8)

## 2018-10-15 LAB — COMPREHENSIVE METABOLIC PANEL
ALT: 11 IU/L (ref 0–32)
AST: 18 IU/L (ref 0–40)
Albumin/Globulin Ratio: 1.4 (ref 1.2–2.2)
Albumin: 4.1 g/dL (ref 3.6–4.6)
Alkaline Phosphatase: 82 IU/L (ref 39–117)
BUN/Creatinine Ratio: 24 (ref 12–28)
BUN: 43 mg/dL — AB (ref 8–27)
Bilirubin Total: 0.5 mg/dL (ref 0.0–1.2)
CO2: 22 mmol/L (ref 20–29)
CREATININE: 1.81 mg/dL — AB (ref 0.57–1.00)
Calcium: 9.9 mg/dL (ref 8.7–10.3)
Chloride: 100 mmol/L (ref 96–106)
GFR calc Af Amer: 29 mL/min/{1.73_m2} — ABNORMAL LOW (ref >59)
GFR, EST NON AFRICAN AMERICAN: 25 mL/min/{1.73_m2} — AB (ref >59)
GLOBULIN, TOTAL: 2.9 g/dL (ref 1.5–4.5)
Glucose: 92 mg/dL (ref 65–99)
Potassium: 4.7 mmol/L (ref 3.5–5.2)
Sodium: 140 mmol/L (ref 134–144)
Total Protein: 7 g/dL (ref 6.0–8.5)

## 2018-10-15 LAB — LIPID PANEL
CHOL/HDL RATIO: 1.8 ratio (ref 0.0–4.4)
Cholesterol, Total: 150 mg/dL (ref 100–199)
HDL: 84 mg/dL (ref >39)
LDL CALC: 57 mg/dL (ref 0–99)
Triglycerides: 43 mg/dL (ref 0–149)
VLDL CHOLESTEROL CAL: 9 mg/dL (ref 5–40)

## 2018-10-15 MED ORDER — AMLODIPINE BESYLATE 10 MG PO TABS: 10.0000 mg | ORAL_TABLET | Freq: Every day | ORAL | 3 refills | Status: DC

## 2018-10-15 MED ORDER — LOSARTAN POTASSIUM 50 MG PO TABS: 50.0000 mg | ORAL_TABLET | Freq: Every day | ORAL | 3 refills | Status: DC

## 2018-10-15 MED ORDER — SPIRONOLACTONE 25 MG PO TABS: 25.0000 mg | ORAL_TABLET | Freq: Every day | ORAL | 3 refills | Status: DC

## 2018-10-15 NOTE — Progress Notes (Signed)
Consulted with nephrology regarding CKD advised to stop HCTZ increase amlodipine cont , losartan, lasix and spirolactone re evaluate labs in 2 weeks

## 2018-10-22 ENCOUNTER — Telehealth (INDEPENDENT_AMBULATORY_CARE_PROVIDER_SITE_OTHER): Payer: Self-pay | Admitting: Primary Care

## 2018-10-22 NOTE — Telephone Encounter (Signed)
Gabapentin Patient states she wants to know if she should be taking more of her gabapentin or if the dosage should be changed. Patient says she has 2 pills left and wanted to make sure she should continue taking it until she is able to see someone for her referrals  Patient states she is not sure what referrals were sent out but she has not heard from anyone regarding referrals. Please follow up.

## 2018-10-23 ENCOUNTER — Telehealth: Payer: Self-pay | Admitting: Primary Care

## 2018-10-23 NOTE — Telephone Encounter (Signed)
Due to patient age I am hesitant with increasing. However , when is her neuropathy the worst ie morning, afternoon or bedtime ? I will increase that particular dose time to better manage.

## 2018-10-23 NOTE — Telephone Encounter (Signed)
Patient states she is still having tingling. She is wondering if her gabapentin can be increased or if it even needs to be increased. Please advise. [name], CMA

## 2018-10-25 ENCOUNTER — Other Ambulatory Visit: Payer: Self-pay | Admitting: Primary Care

## 2018-10-25 DIAGNOSIS — R202 Paresthesia of skin: Secondary | ICD-10-CM

## 2018-10-25 MED ORDER — GABAPENTIN 100 MG PO CAPS: ORAL_CAPSULE | ORAL | 1 refills | Status: DC

## 2018-10-25 NOTE — Telephone Encounter (Signed)
Spoke with patient. Her neuropathy is more intense in the morning and at night. [name], CMA

## 2018-10-25 NOTE — Telephone Encounter (Signed)
Patient is aware that she can now take 2 Gabapentin at bedtime. She would like her lab results. They are in the chart but have not been addressed. Please address labs. [name], CMA e

## 2018-10-25 NOTE — Telephone Encounter (Signed)
Labs wnl except kidney functions are indicating needing to be monitor closely

## 2018-10-25 NOTE — Telephone Encounter (Signed)
Increased medication to take 2 at bedtime . Meds sent in

## 2018-11-01 ENCOUNTER — Encounter (INDEPENDENT_AMBULATORY_CARE_PROVIDER_SITE_OTHER): Payer: Self-pay | Admitting: *Deleted

## 2018-11-01 ENCOUNTER — Telehealth: Payer: Self-pay | Admitting: Primary Care

## 2018-11-01 NOTE — Telephone Encounter (Addendum)
Unable to leave message on voicemail. Will mail letter to patient address listed.

## 2018-11-01 NOTE — Telephone Encounter (Signed)
patient wants labs

## 2018-11-01 NOTE — Telephone Encounter (Signed)
Patient contacted via phone to be given results of labs.  Patient identified by name and date of birth.  Patient given results of labs.  Patient educated on lab results. Questions answered. Patient acknowledged understanding of labs results. 

## 2018-12-11 ENCOUNTER — Telehealth: Payer: Self-pay | Admitting: Primary Care

## 2018-12-11 NOTE — Telephone Encounter (Signed)
New Message  Pt is requesting to speak with a nurse. Would not disclose reason

## 2018-12-12 NOTE — Telephone Encounter (Signed)
Patient called stating that she would like to speak with someone in regards to exams she was set to get done. Please follow up.

## 2018-12-13 NOTE — Telephone Encounter (Signed)
Nurse called the patient's home phone number but received no answer   No voicemail present.  Call # [phone]

## 2018-12-13 NOTE — Telephone Encounter (Signed)
Call place to patient phone number. NO voicemail available to leave a message. [employer] customer is not available per recording. Placed two calls to phone  Number listed.

## 2018-12-16 NOTE — Telephone Encounter (Addendum)
Nurse called the patient's home phone number but received no answer and message was not able to be left.  Letter sent to patient to call clinic

## 2018-12-17 ENCOUNTER — Telehealth: Payer: Self-pay | Admitting: *Deleted

## 2018-12-17 NOTE — Telephone Encounter (Signed)
MA attempted to reach the patient twice at the only number listed. Patient needs to follow up with wound center has she was referred and decided to not attend the appointment.

## 2018-12-17 NOTE — Telephone Encounter (Signed)
Spoke to patient- she states she received a call from the [facility] regarding wound. She state she did not feel that she the consult was still needed from 10/14/2018 because the wound appears to be healing. She report a black scab on it. Pt advised that the black scab could be an unstagable wound and may still need evaluation by a specialist. Wound has been on leg since January and now into May. Encouraged patient to call the office back, given phone number to facility. Patient has an appointment on Thursday with Provider Edwards.

## 2018-12-18 ENCOUNTER — Telehealth: Payer: Self-pay | Admitting: *Deleted

## 2018-12-18 NOTE — Telephone Encounter (Signed)
Patient verified DOB Patient states her right ear is impacted again. Patient complains of right shoulder pain beginning a month ago. Patient is aware of needing to schedule the appointment with wound care per PCP. Patient stated she didn't think she needed the appointment with the specialist instead she wanted the PCP to take a look at the site again. Patient aware of 12/19/2018 visit being in person

## 2018-12-19 ENCOUNTER — Encounter (HOSPITAL_BASED_OUTPATIENT_CLINIC_OR_DEPARTMENT_OTHER): Payer: Medicare HMO | Attending: Internal Medicine

## 2018-12-19 ENCOUNTER — Other Ambulatory Visit: Payer: Self-pay

## 2018-12-19 ENCOUNTER — Encounter: Payer: Self-pay | Admitting: Primary Care

## 2018-12-19 ENCOUNTER — Ambulatory Visit: Payer: Medicare HMO | Attending: Primary Care | Admitting: Primary Care

## 2018-12-19 VITALS — BP 130/75 | HR 71 | Temp 97.9°F | Ht <= 58 in | Wt 148.2 lb

## 2018-12-19 DIAGNOSIS — H6120 Impacted cerumen, unspecified ear: Secondary | ICD-10-CM

## 2018-12-19 DIAGNOSIS — I890 Lymphedema, not elsewhere classified: Secondary | ICD-10-CM | POA: Diagnosis not present

## 2018-12-19 DIAGNOSIS — I129 Hypertensive chronic kidney disease with stage 1 through stage 4 chronic kidney disease, or unspecified chronic kidney disease: Secondary | ICD-10-CM | POA: Diagnosis not present

## 2018-12-19 DIAGNOSIS — N184 Chronic kidney disease, stage 4 (severe): Secondary | ICD-10-CM | POA: Insufficient documentation

## 2018-12-19 DIAGNOSIS — I10 Essential (primary) hypertension: Secondary | ICD-10-CM

## 2018-12-19 DIAGNOSIS — L97919 Non-pressure chronic ulcer of unspecified part of right lower leg with unspecified severity: Secondary | ICD-10-CM

## 2018-12-19 DIAGNOSIS — I739 Peripheral vascular disease, unspecified: Secondary | ICD-10-CM | POA: Diagnosis not present

## 2018-12-19 DIAGNOSIS — R600 Localized edema: Secondary | ICD-10-CM | POA: Diagnosis not present

## 2018-12-19 DIAGNOSIS — L97812 Non-pressure chronic ulcer of other part of right lower leg with fat layer exposed: Secondary | ICD-10-CM | POA: Insufficient documentation

## 2018-12-19 DIAGNOSIS — I87311 Chronic venous hypertension (idiopathic) with ulcer of right lower extremity: Secondary | ICD-10-CM | POA: Insufficient documentation

## 2018-12-19 DIAGNOSIS — G4730 Sleep apnea, unspecified: Secondary | ICD-10-CM | POA: Diagnosis not present

## 2018-12-19 DIAGNOSIS — I872 Venous insufficiency (chronic) (peripheral): Secondary | ICD-10-CM | POA: Diagnosis not present

## 2018-12-19 NOTE — Progress Notes (Signed)
Acute Office Visit  Subjective:    Patient ID: [name], female    DOB: 11-Mar-1930, 85 y.o.   MRN: [id number]  No chief complaint on file.   HPI Patient is in today for the same venous ulcer that she was referred to the [facility] and canceled her appt in March. She has an appt today at 2:00 pm at the wound center for tx. She is HOH and legally blind difficult to manage ADL's and IADL's but refuses assistance.PMH: chronic lymphedema and wears compression hoses at times  , HTN , venous ulcer nonhealing and CKD. Patient is fasting per cardiology Dr. Terrence Dupont requesting fasting lipids , hepatic function and BMP.    Past Medical History:  Diagnosis Date  . CAP (community acquired pneumonia) 09/21/2015  . Chronic kidney disease, stage III (moderate) (HCC)   . Encounter for long-term (current) use of other medications   . Glaucoma (increased eye pressure)   . Head pain   . Hypertension   . Impaired glucose tolerance test   . Insomnia, unspecified   . Morbid obesity ([place])   . Neck pain   . Osteoarthrosis, unspecified whether generalized or localized, other specified sites   . Other lymphedema   . Pain in joint, multiple sites   . Primary open-angle glaucoma(365.11)     Past Surgical History:  Procedure Laterality Date  . ABDOMINAL HYSTERECTOMY    . EYE SURGERY      Family History  Problem Relation Age of Onset  . Cancer Father     Social History   Socioeconomic History  . Marital status: Widowed    Spouse name: Not on file  . Number of children: 1  . Years of education: Not on file  . Highest education level: Not on file  Occupational History  . Occupation: retired  Scientific laboratory technician  . Financial resource strain: Not on file  . Food insecurity:    Worry: Not on file    Inability: Not on file  . Transportation needs:    Medical: Not on file    Non-medical: Not on file  Tobacco Use  . Smoking status: Never Smoker  . Smokeless tobacco: Never Used  Substance  and Sexual Activity  . Alcohol use: No    Alcohol/week: 0.0 standard drinks  . Drug use: No  . Sexual activity: Not on file  Lifestyle  . Physical activity:    Days per week: Not on file    Minutes per session: Not on file  . Stress: Not on file  Relationships  . Social connections:    Talks on phone: Not on file    Gets together: Not on file    Attends religious service: Not on file    Active member of club or organization: Not on file    Attends meetings of clubs or organizations: Not on file    Relationship status: Not on file  . Intimate partner violence:    Fear of current or ex partner: Not on file    Emotionally abused: Not on file    Physically abused: Not on file    Forced sexual activity: Not on file  Other Topics Concern  . Not on file  Social History Narrative   Patient lives at home alone , widow. Patient is retired and one child. 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    Outpatient Medications Prior to Visit  Medication Sig Dispense Refill  . alendronate (FOSAMAX) 35  MG tablet Take 1 tablet (35 mg total) by mouth every 7 (seven) days. Take with a full glass of water on an empty stomach. (Patient taking differently: Take 35 mg by mouth every Sunday. Take with a full glass of water on an empty stomach.) 12 tablet 3  . amLODipine (NORVASC) 10 MG tablet Take 1 tablet (10 mg total) by mouth daily. 90 tablet 3  . aspirin EC 81 MG tablet Take 1 tablet (81 mg total) by mouth daily. 90 tablet 3  . bacitracin 500 UNIT/GM ointment Apply 1 application topically 2 (two) times daily. 15 g 0  . brimonidine (ALPHAGAN) 0.15 % ophthalmic solution Place 1 drop into the left eye 2 (two) times daily.     . clindamycin (CLEOCIN) 150 MG capsule Take 150 mg by mouth daily.     . CVS VITAMIN C 500 MG tablet Take 500 mg by mouth daily.    . Cyanocobalamin (VITAMIN B-12 PO) Take 1 tablet by mouth daily.    Marland Kitchen docusate sodium (COLACE) 50 MG capsule Take 1 capsule (50 mg  total) by mouth 2 (two) times daily. 10 capsule 0  . doxycycline (MONODOX) 100 MG capsule Take 100 mg by mouth daily.     . furosemide (LASIX) 20 MG tablet Take 1 tablet (20 mg total) by mouth daily. 90 tablet 2  . gabapentin (NEURONTIN) 100 MG capsule Take 2 tablets at bed time 60 capsule 1  . GARLIC PO Take 3,244 mg by mouth daily.     . Melatonin 5 MG TABS Take 1 tablet (5 mg total) by mouth at bedtime. 30 tablet 3  . Multiple Vitamin (MULTIVITAMIN WITH MINERALS) TABS Take 1 tablet by mouth daily.    Vladimir Faster Glycol-Propyl Glycol 0.4-0.3 % SOLN Place 1 drop into both eyes 3 (three) times daily as needed (for lubricating eyes).    . prednisoLONE acetate (PRED FORTE) 1 % ophthalmic suspension Place 1 drop into the left eye daily.     Marland Kitchen spironolactone (ALDACTONE) 25 MG tablet Take 1 tablet (25 mg total) by mouth daily. 90 tablet 3  . losartan (COZAAR) 50 MG tablet Take 1 tablet (50 mg total) by mouth daily for 30 days. 90 tablet 3   No facility-administered medications prior to visit.     Allergies  Allergen Reactions  . Penicillins Hives    Has patient had a PCN reaction causing immediate rash, facial/tongue/throat swelling, SOB or lightheadedness with hypotension: No Has patient had a PCN reaction causing severe rash involving mucus membranes or skin necrosis: No Has patient had a PCN reaction that required hospitalization No Has patient had a PCN reaction occurring within the last 10 years: No If all of the above answers are "NO", then may proceed with Cephalosporin use.  . Tetanus Toxoid Hives    Review of Systems  Constitutional: Negative.   HENT: Positive for hearing loss.   Eyes:       Left eye   Respiratory: Negative.   Cardiovascular: Positive for leg swelling.  Gastrointestinal: Negative.   Genitourinary: Negative.   Musculoskeletal: Negative.   Skin: Negative.        Objective:    Physical Exam  Constitutional: She is oriented to person, place, and time. She  appears well-developed and well-nourished.  HENT:  Head: Normocephalic and atraumatic.  Eyes:  No vision in right eye  Neck: Normal range of motion. Neck supple.  Cardiovascular: Normal rate and regular rhythm.  Pulmonary/Chest: Effort normal and breath sounds normal.  Abdominal: Soft. Bowel sounds are normal.  Musculoskeletal:        General: Tenderness and edema present.  Neurological: She is oriented to person, place, and time.  Skin: Skin is warm and dry.  Psychiatric: She has a normal mood and affect.    BP 130/75   Pulse 71   Temp 97.9 F (36.6 C) (Oral)   Ht _0  (1.473 m)   Wt 148 lb 3.2 oz (67.2 kg)   SpO2 98%   BMI 30.97 kg/m  Wt Readings from Last 3 Encounters:  12/19/18 148 lb 3.2 oz (67.2 kg)  10/14/18 144 lb 6.4 oz (65.5 kg)  08/29/18 149 lb 6.4 oz (67.8 kg)    There are no preventive care reminders to display for this patient.  There are no preventive care reminders to display for this patient.   Lab Results  Component Value Date   TSH 1.440 01/02/2017   Lab Results  Component Value Date   WBC 3.1 (L) 10/14/2018   HGB 11.1 10/14/2018   HCT 35.8 10/14/2018   MCV 83 10/14/2018   PLT 225 10/14/2018   Lab Results  Component Value Date   NA 140 10/14/2018   K 4.7 10/14/2018   CO2 22 10/14/2018   GLUCOSE 92 10/14/2018   BUN 43 (H) 10/14/2018   CREATININE 1.81 (H) 10/14/2018   BILITOT 0.5 10/14/2018   ALKPHOS 82 10/14/2018   AST 18 10/14/2018   ALT 11 10/14/2018   PROT 7.0 10/14/2018   ALBUMIN 4.1 10/14/2018   CALCIUM 9.9 10/14/2018   ANIONGAP 4 (L) 11/06/2016   Lab Results  Component Value Date   CHOL 150 10/14/2018   Lab Results  Component Value Date   HDL 84 10/14/2018   Lab Results  Component Value Date   LDLCALC 57 10/14/2018   Lab Results  Component Value Date   TRIG 43 10/14/2018   Lab Results  Component Value Date   CHOLHDL 1.8 10/14/2018   Lab Results  Component Value Date   HGBA1C 6.0 (A) 08/29/2018        Assessment & Plan:  Diagnoses and all orders for this visit:  Impacted cerumen, unspecified laterality HOH -     Ambulatory referral to ENT  Bilateral leg edema Secondary to PVD and noncompliance with wearing compression hose. In patient defense she lives alone with no help provided and putting hose on may be challenging. Note patient does not want any help with her care.  Venous ulcer of right lower extremity without varicose veins (HCC) appt at wound center today at 2 pm. Picture inserted   PVD (peripheral vascular disease) ([place]) -     Lipid panel  Essential hypertension Followed by cardiology requesting blood work -     CMP14+EGFR -     CBC with Differential   Problem List Items Addressed This Visit    Hypertension   Relevant Orders   CMP14+EGFR   CBC with Differential   Bilateral leg edema   Cerumen impaction - Primary   Relevant Orders   Ambulatory referral to ENT   PVD (peripheral vascular disease) ([place])   Relevant Orders   Lipid panel    Other Visit Diagnoses    Venous ulcer of right lower extremity without varicose veins ([place])             No orders of the defined types were placed in this encounter.    Kerin Perna, NP

## 2018-12-20 LAB — CMP14+EGFR
ALT: 11 IU/L (ref 0–32)
AST: 21 IU/L (ref 0–40)
Albumin/Globulin Ratio: 1.4 (ref 1.2–2.2)
Albumin: 4.1 g/dL (ref 3.6–4.6)
Alkaline Phosphatase: 91 IU/L (ref 39–117)
BUN/Creatinine Ratio: 18 (ref 12–28)
BUN: 26 mg/dL (ref 8–27)
Bilirubin Total: 0.4 mg/dL (ref 0.0–1.2)
CO2: 24 mmol/L (ref 20–29)
Calcium: 10 mg/dL (ref 8.7–10.3)
Chloride: 104 mmol/L (ref 96–106)
Creatinine, Ser: 1.41 mg/dL — ABNORMAL HIGH (ref 0.57–1.00)
GFR calc Af Amer: 39 mL/min/{1.73_m2} — ABNORMAL LOW (ref >59)
GFR calc non Af Amer: 34 mL/min/{1.73_m2} — ABNORMAL LOW (ref >59)
Globulin, Total: 2.9 g/dL (ref 1.5–4.5)
Glucose: 97 mg/dL (ref 65–99)
Potassium: 4.4 mmol/L (ref 3.5–5.2)
Sodium: 143 mmol/L (ref 134–144)
Total Protein: 7 g/dL (ref 6.0–8.5)

## 2018-12-20 LAB — CBC WITH DIFFERENTIAL/PLATELET
Basophils Absolute: 0 10*3/uL (ref 0.0–0.2)
Basos: 1 %
EOS (ABSOLUTE): 0.1 10*3/uL (ref 0.0–0.4)
Eos: 4 %
Hematocrit: 34.8 % (ref 34.0–46.6)
Hemoglobin: 11.4 g/dL (ref 11.1–15.9)
Immature Grans (Abs): 0 10*3/uL (ref 0.0–0.1)
Immature Granulocytes: 0 %
Lymphocytes Absolute: 1.4 10*3/uL (ref 0.7–3.1)
Lymphs: 42 %
MCH: 26.5 pg — ABNORMAL LOW (ref 26.6–33.0)
MCHC: 32.8 g/dL (ref 31.5–35.7)
MCV: 81 fL (ref 79–97)
Monocytes Absolute: 0.3 10*3/uL (ref 0.1–0.9)
Monocytes: 10 %
Neutrophils Absolute: 1.5 10*3/uL (ref 1.4–7.0)
Neutrophils: 43 %
Platelets: 210 10*3/uL (ref 150–450)
RBC: 4.3 x10E6/uL (ref 3.77–5.28)
RDW: 14.8 % (ref 11.7–15.4)
WBC: 3.4 10*3/uL (ref 3.4–10.8)

## 2018-12-20 LAB — LIPID PANEL
Chol/HDL Ratio: 1.6 ratio (ref 0.0–4.4)
Cholesterol, Total: 147 mg/dL (ref 100–199)
HDL: 92 mg/dL (ref >39)
LDL Calculated: 45 mg/dL (ref 0–99)
Triglycerides: 50 mg/dL (ref 0–149)
VLDL Cholesterol Cal: 10 mg/dL (ref 5–40)

## 2018-12-25 ENCOUNTER — Telehealth: Payer: Self-pay | Admitting: Primary Care

## 2018-12-25 NOTE — Telephone Encounter (Signed)
New Message   Pt calling to get lab results

## 2018-12-25 NOTE — Telephone Encounter (Signed)
Please release lab results so they maybe relayed to patient.

## 2018-12-26 DIAGNOSIS — I890 Lymphedema, not elsewhere classified: Secondary | ICD-10-CM | POA: Diagnosis not present

## 2018-12-26 NOTE — Telephone Encounter (Signed)
Anemia of chronic disease, chronic kidney disease, chol  wnl

## 2018-12-27 NOTE — Telephone Encounter (Signed)
Pt name and DOB verified. Patient aware of results and result note per Juluis Mire, NP. Pt verbalized understanding.

## 2019-01-02 ENCOUNTER — Encounter (HOSPITAL_BASED_OUTPATIENT_CLINIC_OR_DEPARTMENT_OTHER): Payer: Medicare HMO | Attending: Internal Medicine

## 2019-01-02 DIAGNOSIS — L97812 Non-pressure chronic ulcer of other part of right lower leg with fat layer exposed: Secondary | ICD-10-CM | POA: Insufficient documentation

## 2019-01-02 DIAGNOSIS — N184 Chronic kidney disease, stage 4 (severe): Secondary | ICD-10-CM | POA: Insufficient documentation

## 2019-01-02 DIAGNOSIS — I890 Lymphedema, not elsewhere classified: Secondary | ICD-10-CM | POA: Diagnosis not present

## 2019-01-02 DIAGNOSIS — I87311 Chronic venous hypertension (idiopathic) with ulcer of right lower extremity: Secondary | ICD-10-CM | POA: Insufficient documentation

## 2019-01-02 DIAGNOSIS — G4730 Sleep apnea, unspecified: Secondary | ICD-10-CM | POA: Insufficient documentation

## 2019-01-02 DIAGNOSIS — I129 Hypertensive chronic kidney disease with stage 1 through stage 4 chronic kidney disease, or unspecified chronic kidney disease: Secondary | ICD-10-CM | POA: Insufficient documentation

## 2019-01-09 DIAGNOSIS — I87311 Chronic venous hypertension (idiopathic) with ulcer of right lower extremity: Secondary | ICD-10-CM | POA: Diagnosis not present

## 2019-02-24 ENCOUNTER — Encounter (HOSPITAL_COMMUNITY): Payer: Self-pay

## 2019-02-24 ENCOUNTER — Other Ambulatory Visit: Payer: Self-pay

## 2019-02-24 ENCOUNTER — Ambulatory Visit (HOSPITAL_COMMUNITY)
Admission: EM | Admit: 2019-02-24 | Discharge: 2019-02-24 | Disposition: A | Discharge status: Home / Self Care | Payer: Medicare HMO | Attending: Emergency Medicine | Admitting: Emergency Medicine

## 2019-02-24 DIAGNOSIS — H5789 Other specified disorders of eye and adnexa: Secondary | ICD-10-CM

## 2019-02-24 MED ORDER — POLYMYXIN B-TRIMETHOPRIM 10000-0.1 UNIT/ML-% OP SOLN: 1.0000 [drp] | Freq: Four times a day (QID) | OPHTHALMIC | 0 refills | Status: AC

## 2019-02-24 NOTE — Discharge Instructions (Signed)
Use the eyedrops as prescribed.    Follow-up with your ophthalmologist at [facility] or with the ophthalmologist listed if your symptoms persist.    Go to the emergency apartment if you develop eye pain.

## 2019-02-24 NOTE — ED Triage Notes (Signed)
Pt present left eye swelling, symptoms started 2 days ago. Upon waking up her left eye was swollen with some itching.

## 2019-02-24 NOTE — ED Provider Notes (Signed)
[place]    CSN: [id number] Arrival date & time: 02/24/19  1901     History   Chief Complaint Chief Complaint  Patient presents with  . Eye Problem    Left eye swelling     HPI [name] is a 85 y.o. female.   Patient presents with left eye swelling, itching, drainage x2 days.  She is blind in this eye and is followed by [facility].  She denies acute eye pain; she denies injury.  She denies fever, chills, ear pain, sore throat, cough, congestion, or other symptoms.  The history is provided by the patient and a relative.    Past Medical History:  Diagnosis Date  . CAP (community acquired pneumonia) 09/21/2015  . Chronic kidney disease, stage III (moderate) (HCC)   . Encounter for long-term (current) use of other medications   . Glaucoma (increased eye pressure)   . Head pain   . Hypertension   . Impaired glucose tolerance test   . Insomnia, unspecified   . Morbid obesity ([place])   . Neck pain   . Osteoarthrosis, unspecified whether generalized or localized, other specified sites   . Other lymphedema   . Pain in joint, multiple sites   . Primary open-angle glaucoma(365.11)     Patient Active Problem List   Diagnosis Date Noted  . PVD (peripheral vascular disease) ([place]) 12/19/2018  . Mitral valve insufficiency 03/19/2017  . Other spondylosis with radiculopathy, cervical region 03/14/2017  . Cerumen impaction 12/04/2016  . Generalized weakness 09/22/2015  . Acidosis 09/22/2015  . Acute encephalopathy 09/22/2015  . Hyperglycemia 09/22/2015  . Leukopenia 09/22/2015  . CAP (community acquired pneumonia) 09/22/2015  . Bilateral leg edema 09/22/2015  . Constipation 09/22/2015  . Snoring 09/13/2015  . Glaucoma (increased eye pressure)   . Hypertension   . Neck pain   . Head pain   . Pain in joint, multiple sites   . Impaired glucose tolerance test   . Osteoarthrosis, unspecified whether generalized or localized, other specified sites   . Morbid  obesity ([place])   . Encounter for long-term (current) use of other medications   . Other lymphedema   . Insomnia, unspecified   . Chronic kidney disease, stage III (moderate) (HCC)   . Primary open-angle glaucoma(365.11)     Past Surgical History:  Procedure Laterality Date  . ABDOMINAL HYSTERECTOMY    . EYE SURGERY      OB History   No obstetric history on file.      Home Medications    Prior to Admission medications   Medication Sig Start Date End Date Taking? Authorizing Provider  alendronate (FOSAMAX) 35 MG tablet Take 1 tablet (35 mg total) by mouth every 7 (seven) days. Take with a full glass of water on an empty stomach. Patient taking differently: Take 35 mg by mouth every Sunday. Take with a full glass of water on an empty stomach. 04/24/18   Clent Demark, PA-C  amLODipine (NORVASC) 10 MG tablet Take 1 tablet (10 mg total) by mouth daily. 10/15/18   Kerin Perna, NP  aspirin EC 81 MG tablet Take 1 tablet (81 mg total) by mouth daily. 03/26/17   Clent Demark, PA-C  bacitracin 500 UNIT/GM ointment Apply 1 application topically 2 (two) times daily. 10/14/18   Kerin Perna, NP  brimonidine (ALPHAGAN) 0.15 % ophthalmic solution Place 1 drop into the left eye 2 (two) times daily.  10/29/12   [provider]  clindamycin (CLEOCIN) 150 MG capsule Take 150 mg by mouth daily.  07/10/18   [provider]  [facility] VITAMIN C 500 MG tablet Take 500 mg by mouth daily. 06/24/18   [provider]  Cyanocobalamin (VITAMIN B-12 PO) Take 1 tablet by mouth daily.    [provider]  docusate sodium (COLACE) 50 MG capsule Take 1 capsule (50 mg total) by mouth 2 (two) times daily. 03/26/17   Clent Demark, PA-C  doxycycline (MONODOX) 100 MG capsule Take 100 mg by mouth daily.  07/08/18   [provider]  furosemide (LASIX) 20 MG tablet Take 1 tablet (20 mg total) by mouth daily. 10/14/18   Kerin Perna, NP  gabapentin  (NEURONTIN) 100 MG capsule Take 2 tablets at bed time 10/25/18   Kerin Perna, NP  GARLIC PO Take 2,595 mg by mouth daily.     [provider]  losartan (COZAAR) 50 MG tablet Take 1 tablet (50 mg total) by mouth daily for 30 days. 10/15/18 11/14/18  Kerin Perna, NP  Melatonin 5 MG TABS Take 1 tablet (5 mg total) by mouth at bedtime. 10/14/18   Kerin Perna, NP  Multiple Vitamin (MULTIVITAMIN WITH MINERALS) TABS Take 1 tablet by mouth daily.    [provider]  Polyethyl Glycol-Propyl Glycol 0.4-0.3 % SOLN Place 1 drop into both eyes 3 (three) times daily as needed (for lubricating eyes).    [provider]  prednisoLONE acetate (PRED FORTE) 1 % ophthalmic suspension Place 1 drop into the left eye daily.  10/29/12   [provider]  spironolactone (ALDACTONE) 25 MG tablet Take 1 tablet (25 mg total) by mouth daily. 10/15/18   Kerin Perna, NP  trimethoprim-polymyxin b (POLYTRIM) ophthalmic solution Place 1 drop into the left eye 4 (four) times daily for 7 days. 02/24/19 03/03/19  Sharion Balloon, NP    Family History Family History  Problem Relation Age of Onset  . Cancer Father     Social History Social History   Tobacco Use  . Smoking status: Never Smoker  . Smokeless tobacco: Never Used  Substance Use Topics  . Alcohol use: No    Alcohol/week: 0.0 standard drinks  . Drug use: No     Allergies   Penicillins and Tetanus toxoid   Review of Systems Review of Systems  Constitutional: Negative for chills and fever.  HENT: Negative for congestion, ear pain, rhinorrhea and sore throat.   Eyes: Positive for discharge and itching. Negative for pain and visual disturbance.  Respiratory: Negative for cough and shortness of breath.   Cardiovascular: Negative for chest pain and palpitations.  Gastrointestinal: Negative for abdominal pain and vomiting.  Genitourinary: Negative for dysuria and hematuria.  Musculoskeletal: Negative for  arthralgias and back pain.  Skin: Negative for color change and rash.  Neurological: Negative for seizures and syncope.  All other systems reviewed and are negative.    Physical Exam Triage Vital Signs ED Triage Vitals  Enc Vitals Group     BP      Pulse      Resp      Temp      Temp src      SpO2      Weight      Height      Head Circumference      Peak Flow      Pain Score      Pain Loc      Pain Edu?  Excl. in [place]?    No data found.  Updated Vital Signs BP 129/60 (BP Location: Left Arm)   Pulse (!) 57   Temp 98.3 F (36.8 C) (Oral)   Resp 16   SpO2 97%   Visual Acuity Right Eye Distance:   Left Eye Distance:   Bilateral Distance:    Right Eye Near:   Left Eye Near:    Bilateral Near:     Physical Exam Vitals signs and nursing note reviewed.  Constitutional:      General: She is not in acute distress.    Appearance: She is well-developed.  HENT:     Head: Normocephalic and atraumatic.     Right Ear: Tympanic membrane normal.     Left Ear: Tympanic membrane normal.     Nose: Nose normal.     Mouth/Throat:     Mouth: Mucous membranes are moist.     Pharynx: Oropharynx is clear.  Eyes:     General:        Right eye: No foreign body or discharge.        Left eye: Discharge present.No foreign body.     Extraocular Movements: Extraocular movements intact.     Conjunctiva/sclera: Conjunctivae normal.     Right eye: Right conjunctiva is not injected.     Left eye: Left conjunctiva is not injected.     Comments: Left upper and lower eyelids edematous.  Small amount of crusted drainage on inner canthus.  Neck:     Musculoskeletal: Neck supple.  Cardiovascular:     Rate and Rhythm: Normal rate and regular rhythm.  Pulmonary:     Effort: Pulmonary effort is normal. No respiratory distress.     Breath sounds: Normal breath sounds.  Abdominal:     Palpations: Abdomen is soft.     Tenderness: There is no abdominal tenderness. There is no guarding or  rebound.  Skin:    General: Skin is warm and dry.  Neurological:     Mental Status: She is alert.      UC Treatments / Results  Labs (all labs ordered are listed, but only abnormal results are displayed) Labs Reviewed - No data to display  EKG   Radiology No results found.  Procedures Procedures (including critical care time)  Medications Ordered in UC Medications - No data to display  Initial Impression / Assessment and Plan / UC Course  I have reviewed the triage vital signs and the nursing notes.  Pertinent labs & imaging results that were available during my care of the patient were reviewed by me and considered in my medical decision making (see chart for details).   Left eye swelling.  No eye pain; patient has glaucoma and is followed by [facility] ophthalmology.  Treating today with Polytrim eyedrops.  Patient instructed to follow-up with her ophthalmologist at [facility] if her symptoms persist.  Direct instructions given to patient to go to the emergency department if she develops eye pain.     Final Clinical Impressions(s) / UC Diagnoses   Final diagnoses:  Eye swelling, left     Discharge Instructions     Use the eyedrops as prescribed.    Follow-up with your ophthalmologist at [facility] or with the ophthalmologist listed if your symptoms persist.    Go to the emergency apartment if you develop eye pain.        ED Prescriptions    Medication Sig Dispense Auth. Provider   trimethoprim-polymyxin b (POLYTRIM) ophthalmic solution Place  1 drop into the left eye 4 (four) times daily for 7 days. 10 mL Sharion Balloon, NP     Controlled Substance Prescriptions [place] Controlled Substance Registry consulted? Not Applicable   Sharion Balloon, NP 02/24/19 2046

## 2019-02-26 ENCOUNTER — Telehealth (INDEPENDENT_AMBULATORY_CARE_PROVIDER_SITE_OTHER): Payer: Self-pay

## 2019-02-26 NOTE — Telephone Encounter (Signed)
Patient called to request refill of Gabapentin sent to [facility] [address] [name], CMA

## 2019-02-28 ENCOUNTER — Other Ambulatory Visit (INDEPENDENT_AMBULATORY_CARE_PROVIDER_SITE_OTHER): Payer: Self-pay | Admitting: Primary Care

## 2019-02-28 DIAGNOSIS — R202 Paresthesia of skin: Secondary | ICD-10-CM

## 2019-02-28 MED ORDER — GABAPENTIN 100 MG PO CAPS: ORAL_CAPSULE | ORAL | 3 refills | Status: DC

## 2019-03-05 ENCOUNTER — Encounter (HOSPITAL_COMMUNITY): Payer: Self-pay | Admitting: Emergency Medicine

## 2019-03-05 ENCOUNTER — Ambulatory Visit (HOSPITAL_COMMUNITY)
Admission: EM | Admit: 2019-03-05 | Discharge: 2019-03-05 | Disposition: A | Discharge status: Home / Self Care | Payer: Medicare HMO | Attending: Emergency Medicine | Admitting: Emergency Medicine

## 2019-03-05 ENCOUNTER — Other Ambulatory Visit: Payer: Self-pay

## 2019-03-05 DIAGNOSIS — S61211A Laceration without foreign body of left index finger without damage to nail, initial encounter: Secondary | ICD-10-CM

## 2019-03-05 DIAGNOSIS — W268XXA Contact with other sharp object(s), not elsewhere classified, initial encounter: Secondary | ICD-10-CM | POA: Diagnosis not present

## 2019-03-05 NOTE — ED Provider Notes (Signed)
[place]    CSN: [id number] Arrival date & time: 03/05/19  1328      History   Chief Complaint Chief Complaint  Patient presents with  . Finger Injury    HPI [name] is a 85 y.o. female.   [name] presents with complaints of laceration to pad of distal left hand index finger. This morning while opening a package she accidentally cut her finger. She did cleanse it and tape it with gauze to hold pressure to help with bleeding. Bleeding well controlled. No numbness or tingling. Minimal pain at this time. Hx of CAP, KCD, glaucoma, htn, OA. States she is allergic to tetanus vacc. She is not on a blood thinner.     ROS per HPI, negative if not otherwise mentioned.      Past Medical History:  Diagnosis Date  . CAP (community acquired pneumonia) 09/21/2015  . Chronic kidney disease, stage III (moderate) (HCC)   . Encounter for long-term (current) use of other medications   . Glaucoma (increased eye pressure)   . Head pain   . Hypertension   . Impaired glucose tolerance test   . Insomnia, unspecified   . Morbid obesity ([place])   . Neck pain   . Osteoarthrosis, unspecified whether generalized or localized, other specified sites   . Other lymphedema   . Pain in joint, multiple sites   . Primary open-angle glaucoma(365.11)     Patient Active Problem List   Diagnosis Date Noted  . PVD (peripheral vascular disease) ([place]) 12/19/2018  . Mitral valve insufficiency 03/19/2017  . Other spondylosis with radiculopathy, cervical region 03/14/2017  . Cerumen impaction 12/04/2016  . Generalized weakness 09/22/2015  . Acidosis 09/22/2015  . Acute encephalopathy 09/22/2015  . Hyperglycemia 09/22/2015  . Leukopenia 09/22/2015  . CAP (community acquired pneumonia) 09/22/2015  . Bilateral leg edema 09/22/2015  . Constipation 09/22/2015  . Snoring 09/13/2015  . Glaucoma (increased eye pressure)   . Hypertension   . Neck pain   . Head pain   . Pain in joint,  multiple sites   . Impaired glucose tolerance test   . Osteoarthrosis, unspecified whether generalized or localized, other specified sites   . Morbid obesity ([place])   . Encounter for long-term (current) use of other medications   . Other lymphedema   . Insomnia, unspecified   . Chronic kidney disease, stage III (moderate) (HCC)   . Primary open-angle glaucoma(365.11)     Past Surgical History:  Procedure Laterality Date  . ABDOMINAL HYSTERECTOMY    . EYE SURGERY      OB History   No obstetric history on file.      Home Medications    Prior to Admission medications   Medication Sig Start Date End Date Taking? Authorizing Provider  alendronate (FOSAMAX) 35 MG tablet Take 1 tablet (35 mg total) by mouth every 7 (seven) days. Take with a full glass of water on an empty stomach. Patient taking differently: Take 35 mg by mouth every Sunday. Take with a full glass of water on an empty stomach. 04/24/18   Clent Demark, PA-C  amLODipine (NORVASC) 10 MG tablet Take 1 tablet (10 mg total) by mouth daily. 10/15/18   Kerin Perna, NP  aspirin EC 81 MG tablet Take 1 tablet (81 mg total) by mouth daily. 03/26/17   Clent Demark, PA-C  bacitracin 500 UNIT/GM ointment Apply 1 application topically 2 (two) times daily. 10/14/18   Kerin Perna,  NP  brimonidine (ALPHAGAN) 0.15 % ophthalmic solution Place 1 drop into the left eye 2 (two) times daily.  10/29/12   [provider]  clindamycin (CLEOCIN) 150 MG capsule Take 150 mg by mouth daily.  07/10/18   [provider]  [facility] VITAMIN C 500 MG tablet Take 500 mg by mouth daily. 06/24/18   [provider]  Cyanocobalamin (VITAMIN B-12 PO) Take 1 tablet by mouth daily.    [provider]  docusate sodium (COLACE) 50 MG capsule Take 1 capsule (50 mg total) by mouth 2 (two) times daily. 03/26/17   Clent Demark, PA-C  doxycycline (MONODOX) 100 MG capsule Take 100 mg by mouth daily.  07/08/18    [provider]  furosemide (LASIX) 20 MG tablet Take 1 tablet (20 mg total) by mouth daily. 10/14/18   Kerin Perna, NP  gabapentin (NEURONTIN) 100 MG capsule Take 2 tablets at bed time 02/28/19   Kerin Perna, NP  GARLIC PO Take 9,381 mg by mouth daily.     [provider]  losartan (COZAAR) 50 MG tablet Take 1 tablet (50 mg total) by mouth daily for 30 days. 10/15/18 11/14/18  Kerin Perna, NP  Melatonin 5 MG TABS Take 1 tablet (5 mg total) by mouth at bedtime. 10/14/18   Kerin Perna, NP  Multiple Vitamin (MULTIVITAMIN WITH MINERALS) TABS Take 1 tablet by mouth daily.    [provider]  Polyethyl Glycol-Propyl Glycol 0.4-0.3 % SOLN Place 1 drop into both eyes 3 (three) times daily as needed (for lubricating eyes).    [provider]  prednisoLONE acetate (PRED FORTE) 1 % ophthalmic suspension Place 1 drop into the left eye daily.  10/29/12   [provider]  spironolactone (ALDACTONE) 25 MG tablet Take 1 tablet (25 mg total) by mouth daily. 10/15/18   Kerin Perna, NP    Family History Family History  Problem Relation Age of Onset  . Cancer Father     Social History Social History   Tobacco Use  . Smoking status: Never Smoker  . Smokeless tobacco: Never Used  Substance Use Topics  . Alcohol use: No    Alcohol/week: 0.0 standard drinks  . Drug use: No     Allergies   Penicillins and Tetanus toxoid   Review of Systems Review of Systems   Physical Exam Triage Vital Signs ED Triage Vitals  Enc Vitals Group     BP 03/05/19 1410 136/66     Pulse Rate 03/05/19 1410 (!) 58     Resp 03/05/19 1410 18     Temp 03/05/19 1410 (!) 97.2 F (36.2 C)     Temp Source 03/05/19 1410 Oral     SpO2 03/05/19 1410 100 %     Weight --      Height --      Head Circumference --      Peak Flow --      Pain Score 03/05/19 1526 7     Pain Loc --      Pain Edu? --      Excl. in [place]? --    No data found.  Updated  Vital Signs BP 136/66 (BP Location: Left Arm)   Pulse (!) 58   Temp (!) 97.2 F (36.2 C) (Oral)   Resp 18   SpO2 100%    Physical Exam Constitutional:      General: She is not in acute distress.    Appearance: She  is well-developed.  Cardiovascular:     Rate and Rhythm: Normal rate.  Pulmonary:     Effort: Pulmonary effort is normal.  Musculoskeletal:     Left hand: She exhibits tenderness and laceration. She exhibits normal range of motion, no bony tenderness, normal two-point discrimination and normal capillary refill.       Hands:     Comments: Laceration to pad of left hand index finger, oozing blood. Laceration is v shaped, well approximated. Approximately 83mm in depth, approximately .75cm in total length; cap refill < 2 seconds; no bony involvement, no joint involvement.   Neurological:     Mental Status: She is alert and oriented to person, place, and time.      UC Treatments / Results  Labs (all labs ordered are listed, but only abnormal results are displayed) Labs Reviewed - No data to display  EKG   Radiology No results found.  Procedures Laceration Repair  Date/Time: 03/05/2019 3:51 PM Performed by: Zigmund Gottron, NP Authorized by: Zigmund Gottron, NP   Consent:    Consent obtained:  Verbal   Consent given by:  Patient   Risks discussed:  Pain, infection and poor cosmetic result   Alternatives discussed:  No treatment and observation Laceration details:    Location:  Finger   Finger location:  L index finger   Length (cm):  0.8   Depth (mm):  3 Repair type:    Repair type:  Simple Exploration:    Hemostasis achieved with:  Direct pressure   Wound extent: no muscle damage noted, no tendon damage noted, no underlying fracture noted and no vascular damage noted   Treatment:    Area cleansed with:  Shur-Clens   Amount of cleaning:  Standard Skin repair:    Repair method:  Tissue adhesive Approximation:    Approximation:  Close  Post-procedure details:    Dressing:  Bulky dressing   Patient tolerance of procedure:  Tolerated well, no immediate complications Comments:     No further bleeding and well approximated with glue application    (including critical care time)  Medications Ordered in UC Medications - No data to display  Initial Impression / Assessment and Plan / UC Course  I have reviewed the triage vital signs and the nursing notes.  Pertinent labs & imaging results that were available during my care of the patient were reviewed by me and considered in my medical decision making (see chart for details).     Wound care discussed. Return precautions provided. Patient verbalized understanding and agreeable to plan.   Final Clinical Impressions(s) / UC Diagnoses   Final diagnoses:  Laceration of left index finger without foreign body without damage to nail, initial encounter     Discharge Instructions     Keep wound glue in place as long as possible, ideally at least 5 days, until it peals off.  Once off wash daily with soap and water.  May use a bulky dressing to provide protection to keep the glue in place.  Please return to be seen if develop increased pain, redness, swelling, or pus drainage.  Ice, elevation, tylenol as needed for pain.    ED Prescriptions    None     Controlled Substance Prescriptions [place] Controlled Substance Registry consulted? Not Applicable   Zigmund Gottron, NP 03/05/19 [phone]

## 2019-03-05 NOTE — Discharge Instructions (Signed)
Keep wound glue in place as long as possible, ideally at least 5 days, until it peals off.  Once off wash daily with soap and water.  May use a bulky dressing to provide protection to keep the glue in place.  Please return to be seen if develop increased pain, redness, swelling, or pus drainage.  Ice, elevation, tylenol as needed for pain.

## 2019-03-05 NOTE — ED Triage Notes (Signed)
Pt here with laceration to left index finger from knife this am per pt

## 2019-03-24 ENCOUNTER — Other Ambulatory Visit: Payer: Self-pay

## 2019-03-24 ENCOUNTER — Ambulatory Visit (INDEPENDENT_AMBULATORY_CARE_PROVIDER_SITE_OTHER): Payer: Medicare HMO | Admitting: Primary Care

## 2019-03-24 ENCOUNTER — Encounter (INDEPENDENT_AMBULATORY_CARE_PROVIDER_SITE_OTHER): Payer: Self-pay | Admitting: Primary Care

## 2019-03-24 VITALS — BP 115/62 | HR 57 | Temp 97.5°F | Ht <= 58 in | Wt 149.0 lb

## 2019-03-24 DIAGNOSIS — S61211D Laceration without foreign body of left index finger without damage to nail, subsequent encounter: Secondary | ICD-10-CM | POA: Diagnosis not present

## 2019-03-24 DIAGNOSIS — S91001S Unspecified open wound, right ankle, sequela: Secondary | ICD-10-CM | POA: Diagnosis not present

## 2019-03-24 DIAGNOSIS — S81801S Unspecified open wound, right lower leg, sequela: Secondary | ICD-10-CM

## 2019-03-24 DIAGNOSIS — R600 Localized edema: Secondary | ICD-10-CM

## 2019-03-24 DIAGNOSIS — G4700 Insomnia, unspecified: Secondary | ICD-10-CM

## 2019-03-24 DIAGNOSIS — S81001S Unspecified open wound, right knee, sequela: Secondary | ICD-10-CM

## 2019-03-24 DIAGNOSIS — I10 Essential (primary) hypertension: Secondary | ICD-10-CM

## 2019-03-24 DIAGNOSIS — R202 Paresthesia of skin: Secondary | ICD-10-CM

## 2019-03-24 DIAGNOSIS — R531 Weakness: Secondary | ICD-10-CM

## 2019-03-24 DIAGNOSIS — R7303 Prediabetes: Secondary | ICD-10-CM

## 2019-03-24 MED ORDER — AMLODIPINE BESYLATE 10 MG PO TABS: 10.0000 mg | ORAL_TABLET | Freq: Every day | ORAL | 0 refills | Status: DC

## 2019-03-24 MED ORDER — LORATADINE 10 MG PO TABS: 10.0000 mg | ORAL_TABLET | Freq: Every day | ORAL | 11 refills | Status: DC

## 2019-03-24 MED ORDER — MELATONIN 5 MG PO TABS: 1.0000 | ORAL_TABLET | Freq: Every day | ORAL | 3 refills | Status: DC

## 2019-03-24 MED ORDER — ALENDRONATE SODIUM 35 MG PO TABS: 35.0000 mg | ORAL_TABLET | ORAL | 3 refills | Status: DC

## 2019-03-24 MED ORDER — FUROSEMIDE 20 MG PO TABS: 20.0000 mg | ORAL_TABLET | Freq: Every day | ORAL | 0 refills | Status: DC

## 2019-03-24 NOTE — Patient Instructions (Signed)
Neuropathic Pain Neuropathic pain is pain caused by damage to the nerves that are responsible for certain sensations in your body (sensory nerves). The pain can be caused by:  Damage to the sensory nerves that send signals to your spinal cord and brain (peripheral nervous system).  Damage to the sensory nerves in your brain or spinal cord (central nervous system). Neuropathic pain can make you more sensitive to pain. Even a minor sensation can feel very painful. This is usually a long-term condition that can be difficult to treat. The type of pain differs from person to person. It may:  Start suddenly (acute), or it may develop slowly and last for a long time (chronic).  Come and go as damaged nerves heal, or it may stay at the same level for years.  Cause emotional distress, loss of sleep, and a lower quality of life. What are the causes? The most common cause of this condition is diabetes. Many other diseases and conditions can also cause neuropathic pain. Causes of neuropathic pain can be classified as:  Toxic. This is caused by medicines and chemicals. The most common cause of toxic neuropathic pain is damage from cancer treatments (chemotherapy).  Metabolic. This can be caused by: ? Diabetes. This is the most common disease that damages the nerves. ? Lack of vitamin B from long-term alcohol abuse.  Traumatic. Any injury that cuts, crushes, or stretches a nerve can cause damage and pain. A common example is feeling pain after losing an arm or leg (phantom limb pain).  Compression-related. If a sensory nerve gets trapped or compressed for a long period of time, the blood supply to the nerve can be cut off.  Vascular. Many blood vessel diseases can cause neuropathic pain by decreasing blood supply and oxygen to nerves.  Autoimmune. This type of pain results from diseases in which the body's defense system (immune system) mistakenly attacks sensory nerves. Examples of autoimmune diseases  that can cause neuropathic pain include lupus and multiple sclerosis.  Infectious. Many types of viral infections can damage sensory nerves and cause pain. Shingles infection is a common cause of this type of pain.  Inherited. Neuropathic pain can be a symptom of many diseases that are passed down through families (genetic). What increases the risk? You are more likely to develop this condition if:  You have diabetes.  You smoke.  You drink too much alcohol.  You are taking certain medicines, including medicines that kill cancer cells (chemotherapy) or that treat immune system disorders. What are the signs or symptoms? The main symptom is pain. Neuropathic pain is often described as:  Burning.  Shock-like.  Stinging.  Hot or cold.  Itching. How is this diagnosed? No single test can diagnose neuropathic pain. It is diagnosed based on:  Physical exam and your symptoms. Your health care provider will ask you about your pain. You may be asked to use a pain scale to describe how bad your pain is.  Tests. These may be done to see if you have a high sensitivity to pain and to help find the cause and location of any sensory nerve damage. They include: ? Nerve conduction studies to test how well nerve signals travel through your sensory nerves (electrodiagnostic testing). ? Stimulating your sensory nerves through electrodes on your skin and measuring the response in your spinal cord and brain (somatosensory evoked potential).  Imaging studies, such as: ? X-rays. ? CT scan. ? MRI. How is this treated? Treatment for neuropathic pain may change   over time. You may need to try different treatment options or a combination of treatments. Some options include:  Treating the underlying cause of the neuropathy, such as diabetes, kidney disease, or vitamin deficiencies.  Stopping medicines that can cause neuropathy, such as chemotherapy.  Medicine to relieve pain. Medicines may  include: ? Prescription or over-the-counter pain medicine. ? Anti-seizure medicine. ? Antidepressant medicines. ? Pain-relieving patches that are applied to painful areas of skin. ? A medicine to numb the area (local anesthetic), which can be injected as a nerve block.  Transcutaneous nerve stimulation. This uses electrical currents to block painful nerve signals. The treatment is painless.  Alternative treatments, such as: ? Acupuncture. ? Meditation. ? Massage. ? Physical therapy. ? Pain management programs. ? Counseling. Follow these instructions at home: Medicines   Take over-the-counter and prescription medicines only as told by your health care provider.  Do not drive or use heavy machinery while taking prescription pain medicine.  If you are taking prescription pain medicine, take actions to prevent or treat constipation. Your health care provider may recommend that you: ? Drink enough fluid to keep your urine pale yellow. ? Eat foods that are high in fiber, such as fresh fruits and vegetables, whole grains, and beans. ? Limit foods that are high in fat and processed sugars, such as fried or sweet foods. ? Take an over-the-counter or prescription medicine for constipation. Lifestyle   Have a good support system at home.  Consider joining a chronic pain support group.  Do not use any products that contain nicotine or tobacco, such as cigarettes and e-cigarettes. If you need help quitting, ask your health care provider.  Do not drink alcohol. General instructions  Learn as much as you can about your condition.  Work closely with all your health care providers to find the treatment plan that works best for you.  Ask your health care provider what activities are safe for you.  Keep all follow-up visits as told by your health care provider. This is important. Contact a health care provider if:  Your pain treatments are not working.  You are having side effects  from your medicines.  You are struggling with tiredness (fatigue), mood changes, depression, or anxiety. Summary  Neuropathic pain is pain caused by damage to the nerves that are responsible for certain sensations in your body (sensory nerves).  Neuropathic pain may come and go as damaged nerves heal, or it may stay at the same level for years.  Neuropathic pain is usually a long-term condition that can be difficult to treat. Consider joining a chronic pain support group. This information is not intended to replace advice given to you by your health care provider. Make sure you discuss any questions you have with your health care provider. Document Released: 04/13/2004 Document Revised: 11/07/2018 Document Reviewed: 08/03/2017 Elsevier Patient Education  2020 [employer]  

## 2019-03-24 NOTE — Progress Notes (Signed)
Established Patient Office Visit  Subjective:  Patient ID: [name], female    DOB: Aug 26, 1929  Age: 85 y.o. MRN: [id number]  CC:  Chief Complaint  Patient presents with  . Follow-up    leg wound  . cut finger  . Hoarse    HPI [name] presents for acute visit concern about the wound on her leg , she cut her finger and she is hoarse. She denies shortness of breath, headaches, chest pain or lower extremity edema.  Past Medical History:  Diagnosis Date  . CAP (community acquired pneumonia) 09/21/2015  . Chronic kidney disease, stage III (moderate) (HCC)   . Encounter for long-term (current) use of other medications   . Glaucoma (increased eye pressure)   . Head pain   . Hypertension   . Impaired glucose tolerance test   . Insomnia, unspecified   . Morbid obesity ([place])   . Neck pain   . Osteoarthrosis, unspecified whether generalized or localized, other specified sites   . Other lymphedema   . Pain in joint, multiple sites   . Primary open-angle glaucoma(365.11)     Past Surgical History:  Procedure Laterality Date  . ABDOMINAL HYSTERECTOMY    . EYE SURGERY      Family History  Problem Relation Age of Onset  . Cancer Father     Social History   Socioeconomic History  . Marital status: Widowed    Spouse name: Not on file  . Number of children: 1  . Years of education: Not on file  . Highest education level: Not on file  Occupational History  . Occupation: retired  Scientific laboratory technician  . Financial resource strain: Not on file  . Food insecurity    Worry: Not on file    Inability: Not on file  . Transportation needs    Medical: Not on file    Non-medical: Not on file  Tobacco Use  . Smoking status: Never Smoker  . Smokeless tobacco: Never Used  Substance and Sexual Activity  . Alcohol use: No    Alcohol/week: 0.0 standard drinks  . Drug use: No  . Sexual activity: Not on file  Lifestyle  . Physical activity    Days per week: Not on file     Minutes per session: Not on file  . Stress: Not on file  Relationships  . Social Herbalist on phone: Not on file    Gets together: Not on file    Attends religious service: Not on file    Active member of club or organization: Not on file    Attends meetings of clubs or organizations: Not on file    Relationship status: Not on file  . Intimate partner violence    Fear of current or ex partner: Not on file    Emotionally abused: Not on file    Physically abused: Not on file    Forced sexual activity: Not on file  Other Topics Concern  . Not on file  Social History Narrative   Patient lives at home alone , widow. Patient is retired and one child. 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    Outpatient Medications Prior to Visit  Medication Sig Dispense Refill  . aspirin EC 81 MG tablet Take 1 tablet (81 mg total) by mouth daily. 90 tablet 3  . bacitracin 500 UNIT/GM ointment Apply 1 application topically 2 (two) times daily. 15 g 0  .  brimonidine (ALPHAGAN) 0.15 % ophthalmic solution Place 1 drop into the left eye 2 (two) times daily.     . [employer] VITAMIN C 500 MG tablet Take 500 mg by mouth daily.    . Cyanocobalamin (VITAMIN B-12 PO) Take 1 tablet by mouth daily.    Marland Kitchen docusate sodium (COLACE) 50 MG capsule Take 1 capsule (50 mg total) by mouth 2 (two) times daily. 10 capsule 0  . gabapentin (NEURONTIN) 100 MG capsule Take 2 tablets at bed time 60 capsule 3  . GARLIC PO Take 1,610 mg by mouth daily.     . Multiple Vitamin (MULTIVITAMIN WITH MINERALS) TABS Take 1 tablet by mouth daily.    Vladimir Faster Glycol-Propyl Glycol 0.4-0.3 % SOLN Place 1 drop into both eyes 3 (three) times daily as needed (for lubricating eyes).    . prednisoLONE acetate (PRED FORTE) 1 % ophthalmic suspension Place 1 drop into the left eye daily.     Marland Kitchen alendronate (FOSAMAX) 35 MG tablet Take 1 tablet (35 mg total) by mouth every 7 (seven) days. Take with a full glass of water on an  empty stomach. (Patient taking differently: Take 35 mg by mouth every Sunday. Take with a full glass of water on an empty stomach.) 12 tablet 3  . amLODipine (NORVASC) 10 MG tablet Take 1 tablet (10 mg total) by mouth daily. 90 tablet 3  . furosemide (LASIX) 20 MG tablet Take 1 tablet (20 mg total) by mouth daily. 90 tablet 2  . Melatonin 5 MG TABS Take 1 tablet (5 mg total) by mouth at bedtime. 30 tablet 3  . spironolactone (ALDACTONE) 25 MG tablet Take 1 tablet (25 mg total) by mouth daily. 90 tablet 3  . losartan (COZAAR) 50 MG tablet Take 1 tablet (50 mg total) by mouth daily for 30 days. 90 tablet 3  . clindamycin (CLEOCIN) 150 MG capsule Take 150 mg by mouth daily.     Marland Kitchen doxycycline (MONODOX) 100 MG capsule Take 100 mg by mouth daily.      No facility-administered medications prior to visit.     Allergies  Allergen Reactions  . Penicillins Hives    Has patient had a PCN reaction causing immediate rash, facial/tongue/throat swelling, SOB or lightheadedness with hypotension: No Has patient had a PCN reaction causing severe rash involving mucus membranes or skin necrosis: No Has patient had a PCN reaction that required hospitalization No Has patient had a PCN reaction occurring within the last 10 years: No If all of the above answers are "NO", then may proceed with Cephalosporin use.  . Tetanus Toxoid Hives    ROS Review of Systems  Constitutional: Positive for activity change.  HENT: Positive for hearing loss.   Eyes: Positive for visual disturbance.  Musculoskeletal: Positive for gait problem.       Cut on left index finger tx by ED  Skin: Positive for wound.       Healing well  Neurological: Positive for weakness.  All other systems reviewed and are negative.     Objective:    Physical Exam  Constitutional: She is oriented to person, place, and time. She appears well-developed and well-nourished.  HENT:  Head: Normocephalic.  Neck: Neck supple.  Cardiovascular:  Normal rate and regular rhythm.  Pulmonary/Chest: Effort normal and breath sounds normal.  Abdominal: Soft. Bowel sounds are normal. She exhibits distension.  Musculoskeletal:        General: Edema present.  Neurological: She is oriented to person, place, and  time.  Skin:  Healing ulcer on right lower leg  Psychiatric: She has a normal mood and affect.    BP 115/62 (BP Location: Left Arm, Patient Position: Sitting, Cuff Size: Normal)   Pulse (!) 57   Temp (!) 97.5 F (36.4 C) (Tympanic)   Ht '4\' 10"'$  (1.473 m)   Wt 149 lb (67.6 kg)   SpO2 95%   BMI 31.14 kg/m  Wt Readings from Last 3 Encounters:  03/24/19 149 lb (67.6 kg)  12/19/18 148 lb 3.2 oz (67.2 kg)  10/14/18 144 lb 6.4 oz (65.5 kg)     Health Maintenance Due  Topic Date Due  . INFLUENZA VACCINE  03/01/2019    There are no preventive care reminders to display for this patient.  Lab Results  Component Value Date   TSH 1.440 01/02/2017   Lab Results  Component Value Date   WBC 3.4 12/19/2018   HGB 11.4 12/19/2018   HCT 34.8 12/19/2018   MCV 81 12/19/2018   PLT 210 12/19/2018   Lab Results  Component Value Date   NA 143 12/19/2018   K 4.4 12/19/2018   CO2 24 12/19/2018   GLUCOSE 97 12/19/2018   BUN 26 12/19/2018   CREATININE 1.41 (H) 12/19/2018   BILITOT 0.4 12/19/2018   ALKPHOS 91 12/19/2018   AST 21 12/19/2018   ALT 11 12/19/2018   PROT 7.0 12/19/2018   ALBUMIN 4.1 12/19/2018   CALCIUM 10.0 12/19/2018   ANIONGAP 4 (L) 11/06/2016   Lab Results  Component Value Date   CHOL 147 12/19/2018   Lab Results  Component Value Date   HDL 92 12/19/2018   Lab Results  Component Value Date   LDLCALC 45 12/19/2018   Lab Results  Component Value Date   TRIG 50 12/19/2018   Lab Results  Component Value Date   CHOLHDL 1.6 12/19/2018   Lab Results  Component Value Date   HGBA1C 6.0 (A) 08/29/2018      Assessment & Plan:   Vieva was seen today for follow-up, cut finger and hoarse.  Diagnoses  and all orders for this visit:  Essential hypertension Well controlled also followed  cardiology . Dr.Hawannii  Counseled on DASH diet, medication compliance, 150 minutes of moderate intensity exercise per week. Discussed medication compliance, adverse effects. -     CBC with Differential -     CMP14+EGFR -     furosemide (LASIX) 20 MG tablet; Take 1 tablet (20 mg total) by mouth daily.  Bilateral leg edema On diuretics monitor -     CMP14+EGFR  Generalized weakness Age and deconditioning  Open wound of right knee, leg, and ankle with complication, sequela Healed  Laceration of left index finger without foreign body without damage to nail, subsequent encounter Treated on 03/05/2019 in emergency room approximately 73m in depth, approximately .75cm in total length Repair with Tissue adhesive. Healed  Lower extremity edema Chronic with lymphedema  -     CMP14+EGFR -     furosemide (LASIX) 20 MG tablet; Take 1 tablet (20 mg total) by mouth daily.  Tingling A1C 6.0 last year denies numbness not affecting ADL's continues to be able to care for self best she can refuses home health.   Insomnia, unspecified type Can try melatonin '5mg'$ -15 mg at night for sleep, can also do benadryl 25-'50mg'$  at night for sleep.  If this does not help we can try prescription medication.  Also here is some information about good sleep hygiene.   Insomnia  Insomnia is frequent trouble falling and/or staying asleep. Insomnia can be a long term problem or a short term problem. Both are common. Insomnia can be a short term problem when the wakefulness is related to a certain stress or worry. Long term insomnia is often related to ongoing stress during waking hours and/or poor sleeping habits. Overtime, sleep deprivation itself can make the problem worse. Every little thing feels more severe because you are overtired and your ability to cope is decreased. CAUSES   Stress, anxiety, and depression.  Poor sleeping  habits.  Distractions such as TV in the bedroom.  Naps close to bedtime.  Engaging in emotionally charged conversations before bed.  Technical reading before sleep.  Alcohol and other sedatives. They may make the problem worse. They can hurt normal sleep patterns and normal dream activity.  Stimulants such as caffeine for several hours prior to bedtime.  Pain syndromes and shortness of breath can cause insomnia.  Exercise late at night.  Changing time zones may cause sleeping problems (jet lag). It is sometimes helpful to have someone observe your sleeping patterns. They should look for periods of not breathing during the night (sleep apnea). They should also look to see how long those periods last. If you live alone or observers are uncertain, you can also be observed at a sleep clinic where your sleep patterns will be professionally monitored. Sleep apnea requires a checkup and treatment. Give your caregivers your medical history. Give your caregivers observations your family has made about your sleep.  SYMPTOMS   Not feeling rested in the morning.  Anxiety and restlessness at bedtime.  Difficulty falling and staying asleep. TREATMENT   Your caregiver may prescribe treatment for an underlying medical disorders. Your caregiver can give advice or help if you are using alcohol or other drugs for self-medication. Treatment of underlying problems will usually eliminate insomnia problems.  Medications can be prescribed for short time use. They are generally not recommended for lengthy use.  Over-the-counter sleep medicines are not recommended for lengthy use. They can be habit forming.  You can promote easier sleeping by making lifestyle changes such as:  Using relaxation techniques that help with breathing and reduce muscle tension.  Exercising earlier in the day.  Changing your diet and the time of your last meal. No night time snacks.  Establish a regular time to go to  bed.  Counseling can help with stressful problems and worry.  Soothing music and white noise may be helpful if there are background noises you cannot remove.  Stop tedious detailed work at least one hour before bedtime. HOME CARE INSTRUCTIONS   Keep a diary. Inform your caregiver about your progress. This includes any medication side effects. See your caregiver regularly. Take note of:  Times when you are asleep.  Times when you are awake during the night.  The quality of your sleep.  How you feel the next day. This information will help your caregiver care for you.  Get out of bed if you are still awake after 15 minutes. Read or do some quiet activity. Keep the lights down. Wait until you feel sleepy and go back to bed.  Keep regular sleeping and waking hours. Avoid naps.  Exercise regularly.  Avoid distractions at bedtime. Distractions include watching television or engaging in any intense or detailed activity like attempting to balance the household checkbook.  Develop a bedtime ritual. Keep a familiar routine of bathing, brushing your teeth, climbing into bed at the same  time each night, listening to soothing music. Routines increase the success of falling to sleep faster.  Use relaxation techniques. This can be using breathing and muscle tension release routines. It can also include visualizing peaceful scenes. You can also help control troubling or intruding thoughts by keeping your mind occupied with boring or repetitive thoughts like the old concept of counting sheep. You can make it more creative like imagining planting one beautiful flower after another in your backyard garden.  During your day, work to eliminate stress. When this is not possible use some of the previous suggestions to help reduce the anxiety that accompanies stressful situations. MAKE SURE YOU:   Understand these instructions.  Will watch your condition.  Will get help right away if you are not doing  well or get worse. Document Released: 07/14/2000 Document Revised: 10/09/2011 Document Reviewed: 08/14/2007 [facility] Patient Information 2015 [place], [place]. This information is not intended to replace advice given to you by your health care provider. Make sure you discuss any questions you have with your health care provider. -     Melatonin 5 MG TABS; Take 1 tablet (5 mg total) by mouth at bedtime.  Prediabetes Recommendations For Diabetic/Prediabetic Patients:  Monitor carbohydrates  - AVOID Animal products, ie. Meat - red/white, Poultry and Dairy/especially cheese - Exercise at least 5 times a week for 30 minutes or preferably daily.  - No Smoking - Drink less than 2 drinks a day.  - Monitor your feet for sores - Have yearly Eye Exams - Recommend annual Flu vaccine  - Recommend Pneumovax and Prevnar vaccines - Shingles Vaccine (Zostavax) if over 48 y.o. Goals:  - BMI less than 24 - Fasting sugar less than 130 or less than 150 if tapering medicines to lose weight  - Systolic BP less than 672  - Diastolic BP less than 80 - Bad LDL Cholesterol less than 70 - Triglycerides less than 150 -     Hemoglobin A1c  Other orders -     alendronate (FOSAMAX) 35 MG tablet; Take 1 tablet (35 mg total) by mouth every 7 (seven) days. Take with a full glass of water on an empty stomach. -     amLODipine (NORVASC) 10 MG tablet; Take 1 tablet (10 mg total) by mouth daily.   Meds ordered this encounter  Medications  . alendronate (FOSAMAX) 35 MG tablet    Sig: Take 1 tablet (35 mg total) by mouth every 7 (seven) days. Take with a full glass of water on an empty stomach.    Dispense:  12 tablet    Refill:  3  . amLODipine (NORVASC) 10 MG tablet    Sig: Take 1 tablet (10 mg total) by mouth daily.    Dispense:  90 tablet    Refill:  0  . Melatonin 5 MG TABS    Sig: Take 1 tablet (5 mg total) by mouth at bedtime.    Dispense:  30 tablet    Refill:  3  . furosemide (LASIX) 20 MG tablet     Sig: Take 1 tablet (20 mg total) by mouth daily.    Dispense:  90 tablet    Refill:  0  . loratadine (CLARITIN) 10 MG tablet    Sig: Take 1 tablet (10 mg total) by mouth daily.    Dispense:  30 tablet    Refill:  11    Follow-up: Return in about 3 months (around 06/24/2019), or if symptoms worsen or fail to improve, for  Followed by cardiology .    Kerin Perna, NP

## 2019-03-25 LAB — CBC WITH DIFFERENTIAL/PLATELET
Basophils Absolute: 0 10*3/uL (ref 0.0–0.2)
Basos: 0 %
EOS (ABSOLUTE): 0.1 10*3/uL (ref 0.0–0.4)
Eos: 4 %
Hematocrit: 30.7 % — ABNORMAL LOW (ref 34.0–46.6)
Hemoglobin: 9.8 g/dL — ABNORMAL LOW (ref 11.1–15.9)
Immature Grans (Abs): 0 10*3/uL (ref 0.0–0.1)
Immature Granulocytes: 0 %
Lymphocytes Absolute: 1.5 10*3/uL (ref 0.7–3.1)
Lymphs: 52 %
MCH: 25.9 pg — ABNORMAL LOW (ref 26.6–33.0)
MCHC: 31.9 g/dL (ref 31.5–35.7)
MCV: 81 fL (ref 79–97)
Monocytes Absolute: 0.3 10*3/uL (ref 0.1–0.9)
Monocytes: 10 %
Neutrophils Absolute: 1 10*3/uL — ABNORMAL LOW (ref 1.4–7.0)
Neutrophils: 34 %
Platelets: 216 10*3/uL (ref 150–450)
RBC: 3.78 x10E6/uL (ref 3.77–5.28)
RDW: 13.7 % (ref 11.7–15.4)
WBC: 3 10*3/uL — ABNORMAL LOW (ref 3.4–10.8)

## 2019-03-25 LAB — CMP14+EGFR
ALT: 11 IU/L (ref 0–32)
AST: 17 IU/L (ref 0–40)
Albumin/Globulin Ratio: 1.3 (ref 1.2–2.2)
Albumin: 3.6 g/dL (ref 3.6–4.6)
Alkaline Phosphatase: 75 IU/L (ref 39–117)
BUN/Creatinine Ratio: 18 (ref 12–28)
BUN: 27 mg/dL (ref 8–27)
Bilirubin Total: 0.5 mg/dL (ref 0.0–1.2)
CO2: 23 mmol/L (ref 20–29)
Calcium: 9.2 mg/dL (ref 8.7–10.3)
Chloride: 95 mmol/L — ABNORMAL LOW (ref 96–106)
Creatinine, Ser: 1.53 mg/dL — ABNORMAL HIGH (ref 0.57–1.00)
GFR calc Af Amer: 35 mL/min/{1.73_m2} — ABNORMAL LOW (ref >59)
GFR calc non Af Amer: 31 mL/min/{1.73_m2} — ABNORMAL LOW (ref >59)
Globulin, Total: 2.8 g/dL (ref 1.5–4.5)
Glucose: 101 mg/dL — ABNORMAL HIGH (ref 65–99)
Potassium: 4.2 mmol/L (ref 3.5–5.2)
Sodium: 132 mmol/L — ABNORMAL LOW (ref 134–144)
Total Protein: 6.4 g/dL (ref 6.0–8.5)

## 2019-03-25 LAB — HEMOGLOBIN A1C
Est. average glucose Bld gHb Est-mCnc: 120 mg/dL
Hgb A1c MFr Bld: 5.8 % — ABNORMAL HIGH (ref 4.8–5.6)

## 2019-03-27 ENCOUNTER — Ambulatory Visit (HOSPITAL_COMMUNITY)
Admission: EM | Admit: 2019-03-27 | Discharge: 2019-03-27 | Disposition: A | Discharge status: Home / Self Care | Payer: Medicare HMO | Attending: Family Medicine | Admitting: Family Medicine

## 2019-03-27 ENCOUNTER — Encounter (HOSPITAL_COMMUNITY): Payer: Self-pay

## 2019-03-27 ENCOUNTER — Other Ambulatory Visit: Payer: Self-pay

## 2019-03-27 DIAGNOSIS — R238 Other skin changes: Secondary | ICD-10-CM

## 2019-03-27 NOTE — ED Triage Notes (Signed)
Patient presents to Urgent Care with complaints of 3-5 blisters on her right posterior forearm since yesterday after sitting in the sun on her porch for an extended time. Patient reports she has been putting alcohol on the blisters.

## 2019-03-27 NOTE — Discharge Instructions (Signed)
These blisters should heal well with no treatment

## 2019-03-27 NOTE — ED Provider Notes (Signed)
[facility] [place]    CSN: [id number] Arrival date & time: 03/27/19  1310      History   Chief Complaint Chief Complaint  Patient presents with  . Recurrent Skin Infections    HPI [name] is a 85 y.o. female.   HPI  Patient is here for multiple blisters on her right arm.  She states she is here for several hours 2 days ago.  She states that she noticed some redness of the skin and blisters.  Family thinks these need to be looked at.  They do not hurt.  They do not itch.  She does not have any other complaints.  Past Medical History:  Diagnosis Date  . CAP (community acquired pneumonia) 09/21/2015  . Chronic kidney disease, stage III (moderate) (HCC)   . Encounter for long-term (current) use of other medications   . Glaucoma (increased eye pressure)   . Head pain   . Hypertension   . Impaired glucose tolerance test   . Insomnia, unspecified   . Morbid obesity ([place])   . Neck pain   . Osteoarthrosis, unspecified whether generalized or localized, other specified sites   . Other lymphedema   . Pain in joint, multiple sites   . Primary open-angle glaucoma(365.11)     Patient Active Problem List   Diagnosis Date Noted  . PVD (peripheral vascular disease) ([place]) 12/19/2018  . Mitral valve insufficiency 03/19/2017  . Other spondylosis with radiculopathy, cervical region 03/14/2017  . Cerumen impaction 12/04/2016  . Generalized weakness 09/22/2015  . Acidosis 09/22/2015  . Acute encephalopathy 09/22/2015  . Hyperglycemia 09/22/2015  . Leukopenia 09/22/2015  . CAP (community acquired pneumonia) 09/22/2015  . Bilateral leg edema 09/22/2015  . Constipation 09/22/2015  . Snoring 09/13/2015  . Glaucoma (increased eye pressure)   . Hypertension   . Neck pain   . Head pain   . Pain in joint, multiple sites   . Impaired glucose tolerance test   . Osteoarthrosis, unspecified whether generalized or localized, other specified sites   . Morbid obesity ([place])   .  Encounter for long-term (current) use of other medications   . Other lymphedema   . Insomnia, unspecified   . Chronic kidney disease, stage III (moderate) (HCC)   . Primary open-angle glaucoma(365.11)     Past Surgical History:  Procedure Laterality Date  . ABDOMINAL HYSTERECTOMY    . EYE SURGERY      OB History   No obstetric history on file.      Home Medications    Prior to Admission medications   Medication Sig Start Date End Date Taking? Authorizing Provider  alendronate (FOSAMAX) 35 MG tablet Take 1 tablet (35 mg total) by mouth every 7 (seven) days. Take with a full glass of water on an empty stomach. 03/24/19   Kerin Perna, NP  amLODipine (NORVASC) 10 MG tablet Take 1 tablet (10 mg total) by mouth daily. 03/24/19   Kerin Perna, NP  aspirin EC 81 MG tablet Take 1 tablet (81 mg total) by mouth daily. 03/26/17   Clent Demark, PA-C  brimonidine (ALPHAGAN) 0.15 % ophthalmic solution Place 1 drop into the left eye 2 (two) times daily.  10/29/12   [provider]  [facility] VITAMIN C 500 MG tablet Take 500 mg by mouth daily. 06/24/18   [provider]  Cyanocobalamin (VITAMIN B-12 PO) Take 1 tablet by mouth daily.    [provider]  docusate sodium (COLACE) 50 MG  capsule Take 1 capsule (50 mg total) by mouth 2 (two) times daily. 03/26/17   Clent Demark, PA-C  furosemide (LASIX) 20 MG tablet Take 1 tablet (20 mg total) by mouth daily. 03/24/19   Kerin Perna, NP  gabapentin (NEURONTIN) 100 MG capsule Take 2 tablets at bed time 02/28/19   Kerin Perna, NP  GARLIC PO Take 123XX123 mg by mouth daily.     [provider]  loratadine (CLARITIN) 10 MG tablet Take 1 tablet (10 mg total) by mouth daily. 03/24/19   Kerin Perna, NP  losartan (COZAAR) 50 MG tablet Take 1 tablet (50 mg total) by mouth daily for 30 days. 10/15/18 11/14/18  Kerin Perna, NP  Melatonin 5 MG TABS Take 1 tablet (5 mg total) by mouth at  bedtime. 03/24/19   Kerin Perna, NP  Multiple Vitamin (MULTIVITAMIN WITH MINERALS) TABS Take 1 tablet by mouth daily.    [provider]  Polyethyl Glycol-Propyl Glycol 0.4-0.3 % SOLN Place 1 drop into both eyes 3 (three) times daily as needed (for lubricating eyes).    [provider]    Family History Family History  Problem Relation Age of Onset  . Cancer Father     Social History Social History   Tobacco Use  . Smoking status: Never Smoker  . Smokeless tobacco: Never Used  Substance Use Topics  . Alcohol use: No    Alcohol/week: 0.0 standard drinks  . Drug use: No     Allergies   Penicillins and Tetanus toxoid   Review of Systems Review of Systems  Constitutional: Negative for chills and fever.  HENT: Negative for ear pain and sore throat.   Eyes: Negative for pain and visual disturbance.  Respiratory: Negative for cough and shortness of breath.   Cardiovascular: Negative for chest pain and palpitations.  Gastrointestinal: Negative for abdominal pain and vomiting.  Genitourinary: Negative for dysuria and hematuria.  Musculoskeletal: Negative for arthralgias and back pain.  Skin: Positive for color change and wound. Negative for rash.  Neurological: Negative for seizures and syncope.  All other systems reviewed and are negative.    Physical Exam Triage Vital Signs ED Triage Vitals  Enc Vitals Group     BP 03/27/19 1328 (!) 114/57     Pulse Rate 03/27/19 1328 66     Resp 03/27/19 1328 17     Temp 03/27/19 1328 97.8 F (36.6 C)     Temp Source 03/27/19 1328 Oral     SpO2 03/27/19 1328 97 %     Weight --      Height --      Head Circumference --      Peak Flow --      Pain Score 03/27/19 1327 0     Pain Loc --      Pain Edu? --      Excl. in [place]? --    No data found.  Updated Vital Signs BP (!) 114/57 (BP Location: Left Arm)   Pulse 66   Temp 97.8 F (36.6 C) (Oral)   Resp 17   SpO2 97%   Visual Acuity Right Eye  Distance:   Left Eye Distance:   Bilateral Distance:    Right Eye Near:   Left Eye Near:    Bilateral Near:     Physical Exam Constitutional:      General: She is not in acute distress.    Appearance: She is well-developed.  HENT:  Head: Normocephalic and atraumatic.  Eyes:     Conjunctiva/sclera: Conjunctivae normal.     Pupils: Pupils are equal, round, and reactive to light.  Neck:     Musculoskeletal: Normal range of motion.  Cardiovascular:     Rate and Rhythm: Normal rate.  Pulmonary:     Effort: Pulmonary effort is normal. No respiratory distress.  Abdominal:     General: There is no distension.     Palpations: Abdomen is soft.  Musculoskeletal: Normal range of motion.  Skin:    General: Skin is warm and dry.     Comments: The right forearm from the elbow to the wrist is faintly erythematous.  Multiple superficial blisters are preserved.  Neurological:     Mental Status: She is alert.  Psychiatric:        Mood and Affect: Mood normal.        Behavior: Behavior normal.      UC Treatments / Results  Labs (all labs ordered are listed, but only abnormal results are displayed) Labs Reviewed - No data to display  EKG   Radiology No results found.  Procedures Procedures (including critical care time)  Medications Ordered in UC Medications - No data to display  Initial Impression / Assessment and Plan / UC Course  I have reviewed the triage vital signs and the nursing notes.  Pertinent labs & imaging results that were available during my care of the patient were reviewed by me and considered in my medical decision making (see chart for details).     My impression is that this is sunburn.  No evidence of bites.  No discomfort.  No evidence of infection. Final Clinical Impressions(s) / UC Diagnoses   Final diagnoses:  Blisters of multiple sites     Discharge Instructions     These blisters should heal well with no treatment   ED Prescriptions     None     Controlled Substance Prescriptions [place] Controlled Substance Registry consulted? Not Applicable   Raylene Everts, MD 03/27/19 1436

## 2019-04-10 ENCOUNTER — Telehealth (INDEPENDENT_AMBULATORY_CARE_PROVIDER_SITE_OTHER): Payer: Self-pay

## 2019-04-10 NOTE — Telephone Encounter (Signed)
Patient called to request most recent lab results to be mailed to her home address.   Please advice [phone]  Thank you [name]

## 2019-04-10 NOTE — Telephone Encounter (Signed)
Results printed and mailed to patient. [name], CMA

## 2019-06-23 ENCOUNTER — Ambulatory Visit (INDEPENDENT_AMBULATORY_CARE_PROVIDER_SITE_OTHER): Payer: Medicare HMO | Admitting: Primary Care

## 2019-06-30 ENCOUNTER — Ambulatory Visit (INDEPENDENT_AMBULATORY_CARE_PROVIDER_SITE_OTHER): Payer: Medicare HMO | Admitting: Otolaryngology

## 2019-06-30 ENCOUNTER — Other Ambulatory Visit: Payer: Self-pay

## 2019-06-30 DIAGNOSIS — H6123 Impacted cerumen, bilateral: Secondary | ICD-10-CM | POA: Diagnosis not present

## 2019-07-29 ENCOUNTER — Telehealth (INDEPENDENT_AMBULATORY_CARE_PROVIDER_SITE_OTHER): Payer: Self-pay | Admitting: Primary Care

## 2019-07-29 NOTE — Telephone Encounter (Signed)
1) Medication(s) Requested (by name): -gabapentin (NEURONTIN) 100 MG capsule   2) [facility]: [id number] [place] [place]   3) Special Requests: -Would like this medications be increased if possible bc she continues to have tingling

## 2019-07-30 ENCOUNTER — Other Ambulatory Visit (INDEPENDENT_AMBULATORY_CARE_PROVIDER_SITE_OTHER): Payer: Self-pay | Admitting: Primary Care

## 2019-07-30 DIAGNOSIS — R202 Paresthesia of skin: Secondary | ICD-10-CM

## 2019-07-30 MED ORDER — GABAPENTIN 100 MG PO CAPS: ORAL_CAPSULE | ORAL | 3 refills | Status: DC

## 2019-08-11 ENCOUNTER — Ambulatory Visit (INDEPENDENT_AMBULATORY_CARE_PROVIDER_SITE_OTHER): Payer: Medicare Other | Admitting: Primary Care

## 2019-08-11 ENCOUNTER — Other Ambulatory Visit: Payer: Self-pay

## 2019-08-11 ENCOUNTER — Encounter (INDEPENDENT_AMBULATORY_CARE_PROVIDER_SITE_OTHER): Payer: Self-pay | Admitting: Primary Care

## 2019-08-11 DIAGNOSIS — I10 Essential (primary) hypertension: Secondary | ICD-10-CM

## 2019-08-11 DIAGNOSIS — R202 Paresthesia of skin: Secondary | ICD-10-CM | POA: Diagnosis not present

## 2019-08-11 DIAGNOSIS — G629 Polyneuropathy, unspecified: Secondary | ICD-10-CM | POA: Diagnosis not present

## 2019-08-11 DIAGNOSIS — Z79899 Other long term (current) drug therapy: Secondary | ICD-10-CM | POA: Diagnosis not present

## 2019-08-11 MED ORDER — GABAPENTIN 100 MG PO CAPS: ORAL_CAPSULE | ORAL | 1 refills | Status: DC

## 2019-08-11 NOTE — Progress Notes (Signed)
Pt complains of lower back pain up to mid spine at times.  Pt BP this morning at 9:10 106/47 HR 62 on left arm  Pt weight is 151.5 lbs

## 2019-08-11 NOTE — Progress Notes (Signed)
Virtual Visit via Telephone Note  I connected with [name] on 08/11/19 at  9:10 AM EST by telephone and verified that I am speaking with the correct person using two identifiers.   I discussed the limitations, risks, security and privacy concerns of performing an evaluation and management service by telephone and the availability of in person appointments. I also discussed with the patient that there may be a patient responsible charge related to this service. The patient expressed understanding and agreed to proceed.   History of Present Illness: [name] is having having a tel visit for blood pressure follow up systolic range from 123XX123 and diastolic ranged from 0000000. Patient states has been following by Dr. Lyla Son for cardiology for over 20 years. Explained this day going forthward he will manage her hypertension.   She complained that her gabapentin 200mg  was not working . Past Medical History:  Diagnosis Date  . CAP (community acquired pneumonia) 09/21/2015  . Chronic kidney disease, stage III (moderate)   . Encounter for long-term (current) use of other medications   . Glaucoma (increased eye pressure)   . Head pain   . Hypertension   . Impaired glucose tolerance test   . Insomnia, unspecified   . Morbid obesity ([place])   . Neck pain   . Osteoarthrosis, unspecified whether generalized or localized, other specified sites   . Other lymphedema   . Pain in joint, multiple sites   . Primary open-angle glaucoma(365.11)    Current Outpatient Medications on File Prior to Visit  Medication Sig Dispense Refill  . alendronate (FOSAMAX) 35 MG tablet Take 1 tablet (35 mg total) by mouth every 7 (seven) days. Take with a full glass of water on an empty stomach. 12 tablet 3  . amLODipine (NORVASC) 10 MG tablet Take 1 tablet (10 mg total) by mouth daily. 90 tablet 0  . aspirin EC 81 MG tablet Take 1 tablet (81 mg total) by mouth daily. 90 tablet 3  . brimonidine (ALPHAGAN) 0.15 %  ophthalmic solution Place 1 drop into the left eye 2 (two) times daily.     . [facility] VITAMIN C 500 MG tablet Take 500 mg by mouth daily.    . Cyanocobalamin (VITAMIN B-12 PO) Take 1 tablet by mouth daily.    Marland Kitchen docusate sodium (COLACE) 50 MG capsule Take 1 capsule (50 mg total) by mouth 2 (two) times daily. 10 capsule 0  . furosemide (LASIX) 20 MG tablet Take 1 tablet (20 mg total) by mouth daily. 90 tablet 0  . gabapentin (NEURONTIN) 100 MG capsule Take 2 tablets at bed time 60 capsule 3  . GARLIC PO Take 123XX123 mg by mouth daily.     Marland Kitchen loratadine (CLARITIN) 10 MG tablet Take 1 tablet (10 mg total) by mouth daily. 30 tablet 11  . Multiple Vitamin (MULTIVITAMIN WITH MINERALS) TABS Take 1 tablet by mouth daily.    Vladimir Faster Glycol-Propyl Glycol 0.4-0.3 % SOLN Place 1 drop into both eyes 3 (three) times daily as needed (for lubricating eyes).    Marland Kitchen losartan (COZAAR) 50 MG tablet Take 1 tablet (50 mg total) by mouth daily for 30 days. 90 tablet 3   No current facility-administered medications on file prior to visit.   Observations/Objective: Review of Systems  Neurological: Positive for tingling.       Numbness feet  Psychiatric/Behavioral: The patient is nervous/anxious.   All other systems reviewed and are negative.  Assessment and Plan: [name] was seen  today for follow-up.  Diagnoses and all orders for this visit:  Essential hypertension Blood pressure systolic range from  systolic range from 123XX123 and diastolic ranged from 0000000. Explained to patient to continue to follow up with cardiology and Dr. Terrence Dupont will manage he Bp and medication.  Neuropathy Patient complained about medication not effective offered to increase to 100mg  in AM and 100  mg at lunch time and continue with 200 mg of gabapentin at bedtime. Than she complains that is too much asked [name] what would she like me to do??? She than said I will see and will try the 2 additional doses.  Medication management  Unable  good do medication reconciliation medications she has been prescribed for Bp we do not have records of them.      Follow Up Instructions:    I discussed the assessment and treatment plan with the patient. The patient was provided an opportunity to ask questions and all were answered. The patient agreed with the plan and demonstrated an understanding of the instructions.   The patient was advised to call back or seek an in-person evaluation if the symptoms worsen or if the condition fails to improve as anticipated.  I provided 15 minutes of non-face-to-face time during this encounter.   Kerin Perna, NP

## 2019-09-09 ENCOUNTER — Encounter (INDEPENDENT_AMBULATORY_CARE_PROVIDER_SITE_OTHER): Payer: Self-pay | Admitting: Primary Care

## 2019-09-09 ENCOUNTER — Other Ambulatory Visit: Payer: Self-pay

## 2019-09-09 ENCOUNTER — Telehealth (INDEPENDENT_AMBULATORY_CARE_PROVIDER_SITE_OTHER): Payer: Self-pay | Admitting: Licensed Clinical Social Worker

## 2019-09-09 ENCOUNTER — Ambulatory Visit (INDEPENDENT_AMBULATORY_CARE_PROVIDER_SITE_OTHER): Payer: Medicare Other | Admitting: Primary Care

## 2019-09-09 DIAGNOSIS — I10 Essential (primary) hypertension: Secondary | ICD-10-CM | POA: Diagnosis not present

## 2019-09-09 DIAGNOSIS — G629 Polyneuropathy, unspecified: Secondary | ICD-10-CM | POA: Diagnosis not present

## 2019-09-09 DIAGNOSIS — F3289 Other specified depressive episodes: Secondary | ICD-10-CM

## 2019-09-09 NOTE — Telephone Encounter (Signed)
Call placed to patient regarding [facility] referral. LCSW left message requesting a return call.  

## 2019-09-09 NOTE — Progress Notes (Signed)
Virtual Visit via Telephone Note  I connected with [name] on 09/09/19 at  8:30 AM EST by telephone and verified that I am speaking with the correct person using two identifiers.   I discussed the limitations, risks, security and privacy concerns of performing an evaluation and management service by telephone and the availability of in person appointments. I also discussed with the patient that there may be a patient responsible charge related to this service. The patient expressed understanding and agreed to proceed.   History of Present Illness: [name] is having a tele visit for the effectiveness of the of gabapentin first complained it was $300. She admits the pain is gone and the numbness is better. Patient is requesting a release form be to be mailed to her to send to Dr. Terrence Dupont to have her informations sent to [facility].  Systolic ranges 123456 and diastolic 123XX123 Past Medical History:  Diagnosis Date  . CAP (community acquired pneumonia) 09/21/2015  . Chronic kidney disease, stage III (moderate)   . Encounter for long-term (current) use of other medications   . Glaucoma (increased eye pressure)   . Head pain   . Hypertension   . Impaired glucose tolerance test   . Insomnia, unspecified   . Morbid obesity ([place])   . Neck pain   . Osteoarthrosis, unspecified whether generalized or localized, other specified sites   . Other lymphedema   . Pain in joint, multiple sites   . Primary open-angle glaucoma(365.11)    Current Outpatient Medications on File Prior to Visit  Medication Sig Dispense Refill  . alendronate (FOSAMAX) 35 MG tablet Take 1 tablet (35 mg total) by mouth every 7 (seven) days. Take with a full glass of water on an empty stomach. 12 tablet 3  . amLODipine (NORVASC) 10 MG tablet Take 1 tablet (10 mg total) by mouth daily. 90 tablet 0  . aspirin EC 81 MG tablet Take 1 tablet (81 mg total) by mouth daily. 90 tablet 3  . brimonidine (ALPHAGAN) 0.15 % ophthalmic  solution Place 1 drop into the left eye 2 (two) times daily.     . [facility] VITAMIN C 500 MG tablet Take 500 mg by mouth daily.    . Cyanocobalamin (VITAMIN B-12 PO) Take 1 tablet by mouth daily.    Marland Kitchen docusate sodium (COLACE) 50 MG capsule Take 1 capsule (50 mg total) by mouth 2 (two) times daily. 10 capsule 0  . furosemide (LASIX) 20 MG tablet Take 1 tablet (20 mg total) by mouth daily. 90 tablet 0  . gabapentin (NEURONTIN) 100 MG capsule Take 1 tablets in AM and 1 tablet at lunch and continue 2 tablets at bedtime 360 capsule 1  . GARLIC PO Take 123XX123 mg by mouth daily.     Marland Kitchen loratadine (CLARITIN) 10 MG tablet Take 1 tablet (10 mg total) by mouth daily. 30 tablet 11  . Multiple Vitamin (MULTIVITAMIN WITH MINERALS) TABS Take 1 tablet by mouth daily.    Vladimir Faster Glycol-Propyl Glycol 0.4-0.3 % SOLN Place 1 drop into both eyes 3 (three) times daily as needed (for lubricating eyes).    Marland Kitchen losartan (COZAAR) 50 MG tablet Take 1 tablet (50 mg total) by mouth daily for 30 days. 90 tablet 3   No current facility-administered medications on file prior to visit.     Observations/Objective: Review of Systems  Musculoskeletal:       Previous fracture and buttocks hurts more but feels better with heating pad  Psychiatric/Behavioral:       Sleep all the time eat and sleep only goes out Saturday for [place]   All other systems reviewed and are negative.   Assessment and Plan: [name] was seen today for medication effectiveness.  Diagnoses and all orders for this visit:  Essential hypertension Well controlled and followed by Dr. Criss Rosales  continue taking  blood pressure please write/log  Readings and follow  low-sodium, DASH diet, medication compliance, 150 minutes of moderate intensity exercise per week. Discussed medication compliance, adverse effects.  Neuropathy Peripheral neuropathy develops slowly over time leading to a loss of sensation. Burning, stabbing, or aching pain in the legs or feet are  common symptoms.Despite the cost of the gabapentin she admits to feeling better and able to hold a cup without dropping it.   Other depression Concerned states stays in the house all the time only eats and sleeps. Doesn't go out but on Saturday for church. Doesn't enjoy watching tv. Advised I would have the CSW to reach out to her sounds like a form of depression.   Follow Up Instructions:    I discussed the assessment and treatment plan with the patient. The patient was provided an opportunity to ask questions and all were answered. The patient agreed with the plan and demonstrated an understanding of the instructions.   The patient was advised to call back or seek an in-person evaluation if the symptoms worsen or if the condition fails to improve as anticipated.  I provided 15 minutes of non-face-to-face time during this encounter.   Kerin Perna, NP

## 2019-09-09 NOTE — Progress Notes (Signed)
Gabapentin is helping Neuropathy is better Still has numbness in hands and fingers Pain in buttock area- previously had a fracture. Pain has increased lately. Sitting more increases pain- heating pad helps some.  Some pain in knees

## 2019-09-19 DIAGNOSIS — I38 Endocarditis, valve unspecified: Secondary | ICD-10-CM | POA: Diagnosis not present

## 2019-09-19 DIAGNOSIS — I361 Nonrheumatic tricuspid (valve) insufficiency: Secondary | ICD-10-CM | POA: Diagnosis not present

## 2019-09-19 DIAGNOSIS — E785 Hyperlipidemia, unspecified: Secondary | ICD-10-CM | POA: Diagnosis not present

## 2019-09-19 DIAGNOSIS — I10 Essential (primary) hypertension: Secondary | ICD-10-CM | POA: Diagnosis not present

## 2019-09-19 DIAGNOSIS — R7303 Prediabetes: Secondary | ICD-10-CM | POA: Diagnosis not present

## 2019-09-26 DIAGNOSIS — I10 Essential (primary) hypertension: Secondary | ICD-10-CM | POA: Diagnosis not present

## 2019-09-26 DIAGNOSIS — R7303 Prediabetes: Secondary | ICD-10-CM | POA: Diagnosis not present

## 2019-09-26 DIAGNOSIS — E785 Hyperlipidemia, unspecified: Secondary | ICD-10-CM | POA: Diagnosis not present

## 2019-09-28 ENCOUNTER — Other Ambulatory Visit (INDEPENDENT_AMBULATORY_CARE_PROVIDER_SITE_OTHER): Payer: Self-pay | Admitting: Primary Care

## 2019-09-29 NOTE — Telephone Encounter (Signed)
Sent to PCP ?

## 2019-10-16 ENCOUNTER — Other Ambulatory Visit (INDEPENDENT_AMBULATORY_CARE_PROVIDER_SITE_OTHER): Payer: Self-pay | Admitting: Primary Care

## 2019-10-16 DIAGNOSIS — I10 Essential (primary) hypertension: Secondary | ICD-10-CM

## 2019-10-17 NOTE — Telephone Encounter (Signed)
Sent to covering provider.

## 2019-10-20 ENCOUNTER — Telehealth (INDEPENDENT_AMBULATORY_CARE_PROVIDER_SITE_OTHER): Payer: Self-pay

## 2019-10-20 NOTE — Telephone Encounter (Signed)
Patient called wanting to speak with PCP states she has concerns about her medication. She would like to have a call back from PCP.  Please advice [phone]

## 2019-10-21 NOTE — Telephone Encounter (Signed)
Patient wants Rx for hCTZ.

## 2019-10-21 NOTE — Telephone Encounter (Signed)
Spoke with patient states she is following by Dr. Terrence Dupont states he has changed her medication asked patient to call his office to determine what she is on and what diuretic she is supposed to be taking

## 2019-10-24 ENCOUNTER — Other Ambulatory Visit (INDEPENDENT_AMBULATORY_CARE_PROVIDER_SITE_OTHER): Payer: Self-pay | Admitting: Primary Care

## 2019-10-24 ENCOUNTER — Telehealth (INDEPENDENT_AMBULATORY_CARE_PROVIDER_SITE_OTHER): Payer: Self-pay

## 2019-10-24 DIAGNOSIS — I739 Peripheral vascular disease, unspecified: Secondary | ICD-10-CM

## 2019-10-24 DIAGNOSIS — I10 Essential (primary) hypertension: Secondary | ICD-10-CM

## 2019-10-24 DIAGNOSIS — N183 Chronic kidney disease, stage 3 unspecified: Secondary | ICD-10-CM

## 2019-10-24 NOTE — Telephone Encounter (Signed)
Place call to [name] no answer left a message would order labs call the office to schedule fasting appointment

## 2019-10-24 NOTE — Telephone Encounter (Signed)
Patient would like to speak with PCP. Patient will also like to know if she can schedule a lab appointment.  Please advice [phone]

## 2019-10-24 NOTE — Telephone Encounter (Signed)
Sent to PCP ?

## 2019-11-07 DIAGNOSIS — R600 Localized edema: Secondary | ICD-10-CM | POA: Diagnosis not present

## 2019-11-07 DIAGNOSIS — I87399 Chronic venous hypertension (idiopathic) with other complications of unspecified lower extremity: Secondary | ICD-10-CM | POA: Diagnosis not present

## 2019-11-07 DIAGNOSIS — I10 Essential (primary) hypertension: Secondary | ICD-10-CM | POA: Diagnosis not present

## 2019-11-07 DIAGNOSIS — H6123 Impacted cerumen, bilateral: Secondary | ICD-10-CM | POA: Diagnosis not present

## 2019-11-07 DIAGNOSIS — R8290 Unspecified abnormal findings in urine: Secondary | ICD-10-CM | POA: Diagnosis not present

## 2019-11-10 ENCOUNTER — Other Ambulatory Visit: Payer: Self-pay | Admitting: Internal Medicine

## 2019-11-10 DIAGNOSIS — R600 Localized edema: Secondary | ICD-10-CM

## 2019-11-11 DIAGNOSIS — R7303 Prediabetes: Secondary | ICD-10-CM | POA: Diagnosis not present

## 2019-11-11 DIAGNOSIS — I10 Essential (primary) hypertension: Secondary | ICD-10-CM | POA: Diagnosis not present

## 2019-11-11 DIAGNOSIS — I38 Endocarditis, valve unspecified: Secondary | ICD-10-CM | POA: Diagnosis not present

## 2019-11-11 DIAGNOSIS — E785 Hyperlipidemia, unspecified: Secondary | ICD-10-CM | POA: Diagnosis not present

## 2019-11-11 DIAGNOSIS — R0609 Other forms of dyspnea: Secondary | ICD-10-CM | POA: Diagnosis not present

## 2019-11-13 ENCOUNTER — Other Ambulatory Visit: Payer: Medicare Other

## 2019-11-14 ENCOUNTER — Ambulatory Visit
Admission: RE | Admit: 2019-11-14 | Discharge: 2019-11-14 | Disposition: A | Discharge status: Home / Self Care | Payer: Medicare Other | Source: Ambulatory Visit | Attending: Internal Medicine | Admitting: Internal Medicine

## 2019-11-14 DIAGNOSIS — R600 Localized edema: Secondary | ICD-10-CM | POA: Diagnosis not present

## 2019-11-18 ENCOUNTER — Emergency Department (HOSPITAL_COMMUNITY): Payer: Medicare Other

## 2019-11-18 ENCOUNTER — Other Ambulatory Visit: Payer: Self-pay

## 2019-11-18 ENCOUNTER — Encounter (HOSPITAL_COMMUNITY): Payer: Self-pay

## 2019-11-18 ENCOUNTER — Inpatient Hospital Stay (HOSPITAL_COMMUNITY)
Admission: EM | Admit: 2019-11-18 | Discharge: 2019-11-29 | DRG: 545 | Disposition: A | Discharge status: Skilled Nursing Facility | Payer: Medicare Other | Attending: Family Medicine | Admitting: Family Medicine

## 2019-11-18 DIAGNOSIS — G9341 Metabolic encephalopathy: Secondary | ICD-10-CM | POA: Diagnosis present

## 2019-11-18 DIAGNOSIS — D649 Anemia, unspecified: Secondary | ICD-10-CM | POA: Diagnosis present

## 2019-11-18 DIAGNOSIS — R509 Fever, unspecified: Secondary | ICD-10-CM | POA: Diagnosis not present

## 2019-11-18 DIAGNOSIS — K5900 Constipation, unspecified: Secondary | ICD-10-CM | POA: Diagnosis not present

## 2019-11-18 DIAGNOSIS — I129 Hypertensive chronic kidney disease with stage 1 through stage 4 chronic kidney disease, or unspecified chronic kidney disease: Secondary | ICD-10-CM | POA: Diagnosis present

## 2019-11-18 DIAGNOSIS — Z90710 Acquired absence of both cervix and uterus: Secondary | ICD-10-CM

## 2019-11-18 DIAGNOSIS — E854 Organ-limited amyloidosis: Secondary | ICD-10-CM | POA: Diagnosis not present

## 2019-11-18 DIAGNOSIS — E875 Hyperkalemia: Secondary | ICD-10-CM | POA: Diagnosis not present

## 2019-11-18 DIAGNOSIS — R4182 Altered mental status, unspecified: Principal | ICD-10-CM

## 2019-11-18 DIAGNOSIS — N183 Chronic kidney disease, stage 3 unspecified: Secondary | ICD-10-CM | POA: Diagnosis not present

## 2019-11-18 DIAGNOSIS — R601 Generalized edema: Secondary | ICD-10-CM

## 2019-11-18 DIAGNOSIS — Z20822 Contact with and (suspected) exposure to covid-19: Secondary | ICD-10-CM | POA: Diagnosis not present

## 2019-11-18 DIAGNOSIS — Z79899 Other long term (current) drug therapy: Secondary | ICD-10-CM | POA: Diagnosis not present

## 2019-11-18 DIAGNOSIS — R7303 Prediabetes: Secondary | ICD-10-CM | POA: Diagnosis not present

## 2019-11-18 DIAGNOSIS — E872 Acidosis: Secondary | ICD-10-CM | POA: Diagnosis present

## 2019-11-18 DIAGNOSIS — R739 Hyperglycemia, unspecified: Secondary | ICD-10-CM | POA: Diagnosis not present

## 2019-11-18 DIAGNOSIS — G936 Cerebral edema: Secondary | ICD-10-CM | POA: Diagnosis present

## 2019-11-18 DIAGNOSIS — G9340 Encephalopathy, unspecified: Secondary | ICD-10-CM | POA: Diagnosis not present

## 2019-11-18 DIAGNOSIS — Z7982 Long term (current) use of aspirin: Secondary | ICD-10-CM

## 2019-11-18 DIAGNOSIS — R404 Transient alteration of awareness: Secondary | ICD-10-CM | POA: Diagnosis not present

## 2019-11-18 DIAGNOSIS — N179 Acute kidney failure, unspecified: Secondary | ICD-10-CM | POA: Diagnosis not present

## 2019-11-18 DIAGNOSIS — R600 Localized edema: Secondary | ICD-10-CM | POA: Diagnosis not present

## 2019-11-18 DIAGNOSIS — Z6832 Body mass index (BMI) 32.0-32.9, adult: Secondary | ICD-10-CM

## 2019-11-18 DIAGNOSIS — Z87440 Personal history of urinary (tract) infections: Secondary | ICD-10-CM

## 2019-11-18 DIAGNOSIS — R410 Disorientation, unspecified: Secondary | ICD-10-CM | POA: Diagnosis not present

## 2019-11-18 DIAGNOSIS — R52 Pain, unspecified: Secondary | ICD-10-CM

## 2019-11-18 DIAGNOSIS — I680 Cerebral amyloid angiopathy: Secondary | ICD-10-CM | POA: Diagnosis not present

## 2019-11-18 DIAGNOSIS — I10 Essential (primary) hypertension: Secondary | ICD-10-CM | POA: Diagnosis not present

## 2019-11-18 DIAGNOSIS — Z743 Need for continuous supervision: Secondary | ICD-10-CM | POA: Diagnosis not present

## 2019-11-18 DIAGNOSIS — R000 Tachycardia, unspecified: Secondary | ICD-10-CM | POA: Diagnosis not present

## 2019-11-18 DIAGNOSIS — E876 Hypokalemia: Secondary | ICD-10-CM | POA: Diagnosis present

## 2019-11-18 DIAGNOSIS — E8809 Other disorders of plasma-protein metabolism, not elsewhere classified: Secondary | ICD-10-CM

## 2019-11-18 DIAGNOSIS — R29810 Facial weakness: Secondary | ICD-10-CM | POA: Diagnosis not present

## 2019-11-18 DIAGNOSIS — R29898 Other symptoms and signs involving the musculoskeletal system: Secondary | ICD-10-CM | POA: Diagnosis not present

## 2019-11-18 DIAGNOSIS — R0600 Dyspnea, unspecified: Secondary | ICD-10-CM

## 2019-11-18 LAB — COMPREHENSIVE METABOLIC PANEL
ALT: 28 U/L (ref 0–44)
AST: 66 U/L — ABNORMAL HIGH (ref 15–41)
Albumin: 3.8 g/dL (ref 3.5–5.0)
Alkaline Phosphatase: 85 U/L (ref 38–126)
Anion gap: 13 (ref 5–15)
BUN: 21 mg/dL (ref 8–23)
CO2: 23 mmol/L (ref 22–32)
Calcium: 9.3 mg/dL (ref 8.9–10.3)
Chloride: 107 mmol/L (ref 98–111)
Creatinine, Ser: 1.32 mg/dL — ABNORMAL HIGH (ref 0.44–1.00)
GFR calc Af Amer: 42 mL/min — ABNORMAL LOW (ref >60)
GFR calc non Af Amer: 36 mL/min — ABNORMAL LOW (ref >60)
Glucose, Bld: 168 mg/dL — ABNORMAL HIGH (ref 70–99)
Potassium: 4.5 mmol/L (ref 3.5–5.1)
Sodium: 143 mmol/L (ref 135–145)
Total Bilirubin: 0.9 mg/dL (ref 0.3–1.2)
Total Protein: 8.1 g/dL (ref 6.5–8.1)

## 2019-11-18 LAB — CBC WITH DIFFERENTIAL/PLATELET
Abs Immature Granulocytes: 0.02 10*3/uL (ref 0.00–0.07)
Basophils Absolute: 0 10*3/uL (ref 0.0–0.1)
Basophils Relative: 0 %
Eosinophils Absolute: 0 10*3/uL (ref 0.0–0.5)
Eosinophils Relative: 0 %
HCT: 37.7 % (ref 36.0–46.0)
Hemoglobin: 11.4 g/dL — ABNORMAL LOW (ref 12.0–15.0)
Immature Granulocytes: 0 %
Lymphocytes Relative: 7 %
Lymphs Abs: 0.5 10*3/uL — ABNORMAL LOW (ref 0.7–4.0)
MCH: 25.8 pg — ABNORMAL LOW (ref 26.0–34.0)
MCHC: 30.2 g/dL (ref 30.0–36.0)
MCV: 85.3 fL (ref 80.0–100.0)
Monocytes Absolute: 0.7 10*3/uL (ref 0.1–1.0)
Monocytes Relative: 10 %
Neutro Abs: 5.7 10*3/uL (ref 1.7–7.7)
Neutrophils Relative %: 83 %
Platelets: 218 10*3/uL (ref 150–400)
RBC: 4.42 MIL/uL (ref 3.87–5.11)
RDW: 16.1 % — ABNORMAL HIGH (ref 11.5–15.5)
WBC: 6.9 10*3/uL (ref 4.0–10.5)
nRBC: 0 % (ref 0.0–0.2)

## 2019-11-18 LAB — URINALYSIS, ROUTINE W REFLEX MICROSCOPIC
Bacteria, UA: NONE SEEN
Bilirubin Urine: NEGATIVE
Glucose, UA: NEGATIVE mg/dL
Hgb urine dipstick: NEGATIVE
Ketones, ur: 5 mg/dL — AB
Leukocytes,Ua: NEGATIVE
Nitrite: NEGATIVE
Protein, ur: 100 mg/dL — AB
Specific Gravity, Urine: 1.024 (ref 1.005–1.030)
pH: 5 (ref 5.0–8.0)

## 2019-11-18 LAB — POC SARS CORONAVIRUS 2 AG -  ED: SARS Coronavirus 2 Ag: NEGATIVE

## 2019-11-18 LAB — PROTIME-INR
INR: 1.1 (ref 0.8–1.2)
Prothrombin Time: 14.5 seconds (ref 11.4–15.2)

## 2019-11-18 LAB — APTT: aPTT: 35 seconds (ref 24–36)

## 2019-11-18 LAB — LACTIC ACID, PLASMA
Lactic Acid, Venous: 2.9 mmol/L (ref 0.5–1.9)
Lactic Acid, Venous: 2.9 mmol/L (ref 0.5–1.9)

## 2019-11-18 LAB — D-DIMER, QUANTITATIVE: D-Dimer, Quant: 2.05 ug/mL-FEU — ABNORMAL HIGH (ref 0.00–0.50)

## 2019-11-18 LAB — BRAIN NATRIURETIC PEPTIDE: B Natriuretic Peptide: 296.3 pg/mL — ABNORMAL HIGH (ref 0.0–100.0)

## 2019-11-18 MED ORDER — ACETAMINOPHEN 325 MG PO TABS
650.0000 mg | ORAL_TABLET | Freq: Once | ORAL | Status: AC
  Administered 2019-11-18: 650 mg via ORAL
  Filled 2019-11-18: qty 2

## 2019-11-18 MED ORDER — SODIUM CHLORIDE (PF) 0.9 % IJ SOLN
INTRAMUSCULAR | Status: AC
  Filled 2019-11-18: qty 50

## 2019-11-18 MED ORDER — SODIUM CHLORIDE 0.9 % IV BOLUS (SEPSIS): 1000.0000 mL | Freq: Once | INTRAVENOUS | Status: DC

## 2019-11-18 MED ORDER — IOHEXOL 350 MG/ML SOLN
100.0000 mL | Freq: Once | INTRAVENOUS | Status: AC | PRN
  Administered 2019-11-19: 100 mL via INTRAVENOUS

## 2019-11-18 MED ORDER — SODIUM CHLORIDE 0.9 % IV BOLUS (SEPSIS)
250.0000 mL | Freq: Once | INTRAVENOUS | Status: AC
  Administered 2019-11-18: 250 mL via INTRAVENOUS

## 2019-11-18 MED ORDER — SODIUM CHLORIDE 0.9 % IV SOLN
1.0000 g | INTRAVENOUS | Status: DC
  Administered 2019-11-18: 1 g via INTRAVENOUS
  Filled 2019-11-18: qty 10

## 2019-11-18 NOTE — ED Provider Notes (Signed)
Brief update note  86 year old lady presenting to ER initially as code sepsis.  Infectious work-up notable for elevated lactic acid, but urine negative, CXR negative, patient without specific infectious symptoms.  MRI was ordered to evaluate for acute stroke.  MRI consistent with T2 hyperitensity in occpital region.  Discussed this finding with Dr. Cheral Marker who recommended that patient have MRI with contrast to better evaluate.  Reassessed patient, she remains mildly confused but without acute neurologic deficits.  Given her current clinical condition, believe that she would benefit from admission for observation, MRI imaging. Updated patient - pt does not want ER to ER transfer but willing to be admitted at [facility].  Dr. Cheral Marker said that he would be willing to consult on patient at [facility], okay to wait until tomorrow morning to get MRI as opposed ER to ER [facility] MRI tonight. Dr. Hal Hope will accept to [facility] service.   Lucrezia Starch, MD 11/18/19 2258

## 2019-11-18 NOTE — ED Notes (Signed)
IV team at bedside 

## 2019-11-18 NOTE — Consult Note (Signed)
NEURO HOSPITALIST CONSULT NOTE   Requestig physician: Dr. Alfredia Ferguson  Reason for Consult: AMS  History obtained from:  Chart    HPI:                                                                                                                                          [name] is an 86 y.o. female with a history of CKD, morbid obesity, HTN, BLE edema and osteoarthritis who presented to [facility] yesterday evening via EMS for evaluation of AMS which had been ongoing for the last few days. Family was worried about a possible UTI, as UTIs had caused AMS in the past. Edema was noted by ED team, most notably in her BLE. Her lactate was elevated at 2.9. However, her urinalysis was negative, as was her CXR.   CT head was obtained, revealing new areas of hyperintense T2-weighted signal within the right occipital and anterior right temporal white matter. The Radiological DDx included sequela of prior ischemia or trauma, as well as vasogenic edema from metastatic disease. Further imaging with intravenous contrast was recommended. Also noted were numerous chronic microhemorrhages in a predominantly peripheral distribution that per Radiology appeared most consistent with cerebral amyloid angiopathy.   Neurology was called to further evaluate.   Past Medical History:  Diagnosis Date  . CAP (community acquired pneumonia) 09/21/2015  . Chronic kidney disease, stage III (moderate)   . Encounter for long-term (current) use of other medications   . Glaucoma (increased eye pressure)   . Head pain   . Hypertension   . Impaired glucose tolerance test   . Insomnia, unspecified   . Morbid obesity ([place])   . Neck pain   . Osteoarthrosis, unspecified whether generalized or localized, other specified sites   . Other lymphedema   . Pain in joint, multiple sites   . Primary open-angle glaucoma(365.11)     Past Surgical History:  Procedure Laterality Date  . ABDOMINAL HYSTERECTOMY    . EYE  SURGERY      Family History  Problem Relation Age of Onset  . Cancer Father               Social History:  reports that she has never smoked. She has never used smokeless tobacco. She reports that she does not drink alcohol or use drugs.  Allergies  Allergen Reactions  . Penicillins Hives    Has patient had a PCN reaction causing immediate rash, facial/tongue/throat swelling, SOB or lightheadedness with hypotension: No Has patient had a PCN reaction causing severe rash involving mucus membranes or skin necrosis: No Has patient had a PCN reaction that required hospitalization No Has patient had a PCN reaction occurring within the last 10 years: No If all of the above answers are "NO",  then may proceed with Cephalosporin use.  . Tetanus Toxoid Hives    MEDICATIONS:                                                                                                                     Prior to Admission:  Medications Prior to Admission  Medication Sig Dispense Refill Last Dose  . acetaminophen (TYLENOL) 500 MG tablet Take 500 mg by mouth at bedtime.   11/17/2019 at Unknown time  . alendronate (FOSAMAX) 35 MG tablet Take 1 tablet (35 mg total) by mouth every 7 (seven) days. Take with a full glass of water on an empty stomach. 12 tablet 3 11/17/2019 at Unknown time  . ALOE VERA JUICE PO Take 6 oz by mouth daily.   11/17/2019 at Unknown time  . aspirin EC 81 MG tablet Take 1 tablet (81 mg total) by mouth daily. 90 tablet 3 11/18/2019 at Unknown time  . [employer] VITAMIN C 500 MG tablet Take 500 mg by mouth daily.   11/18/2019 at Unknown time  . Cyanocobalamin (VITAMIN B-12 PO) Take 1 tablet by mouth daily.   11/18/2019 at Unknown time  . docusate sodium (COLACE) 50 MG capsule Take 1 capsule (50 mg total) by mouth 2 (two) times daily. 10 capsule 0 11/18/2019 at Unknown time  . gabapentin (NEURONTIN) 100 MG capsule Take 1 tablets in AM and 1 tablet at lunch and continue 2 tablets at bedtime (Patient taking  differently: Take 100-200 mg by mouth See admin instructions. Take 1 tablets in AM and 1 tablet at lunch and continue 2 tablets at bedtime) 360 capsule 1 11/17/2019 at Unknown time  . GARLIC PO Take 5,573 mg by mouth daily.    11/18/2019 at Unknown time  . losartan (COZAAR) 50 MG tablet TAKE 1 TABLET BY MOUTH EVERY DAY (Patient taking differently: Take 50 mg by mouth daily. ) 90 tablet 3 11/18/2019 at Unknown time  . Multiple Vitamin (MULTIVITAMIN WITH MINERALS) TABS Take 1 tablet by mouth daily.   11/18/2019 at Unknown time  . Polyethyl Glycol-Propyl Glycol 0.4-0.3 % SOLN Place 1 drop into both eyes 3 (three) times daily as needed (for lubricating eyes).   11/17/2019 at Unknown time  . spironolactone (ALDACTONE) 25 MG tablet Take 25 mg by mouth daily.   11/18/2019 at Unknown time  . tetrahydrozoline 0.05 % ophthalmic solution Place 1 drop into both eyes daily as needed (eye irritation).   Past Month at Unknown time  . trolamine salicylate (ASPERCREME) 10 % cream Apply 1 application topically as needed for muscle pain.   Past Month at Unknown time  . amLODipine (NORVASC) 10 MG tablet Take 1 tablet (10 mg total) by mouth daily. (Patient not taking: Reported on 11/18/2019) 90 tablet 0 Not Taking at Unknown time  . furosemide (LASIX) 20 MG tablet Take 1 tablet (20 mg total) by mouth daily. (Patient not taking: Reported on 11/18/2019) 90 tablet 0 Not Taking at Unknown time  . loratadine (CLARITIN) 10 MG tablet Take  1 tablet (10 mg total) by mouth daily. (Patient not taking: Reported on 11/18/2019) 30 tablet 11 Not Taking at Unknown time   Scheduled: . enoxaparin (LOVENOX) injection  40 mg Subcutaneous Q24H  . sodium chloride (PF)       Continuous: . sodium chloride Stopped (11/19/19 0845)     ROS:                                                                                                                                       As per HPI. Denies any other symptoms on comprehensive ROS, including no  headache, vision changes, trouble talking, weakness, numbness, CP or SOB.    Blood pressure (!) 164/77, pulse 95, temperature 100 F (37.8 C), temperature source Rectal, resp. rate (!) 23, SpO2 100 %.   General Examination:                                                                                                       Physical Exam  HEENT-  Prominent left corneal opacification.    Lungs- Respirations unlabored Extremities- Prominent BLE edema  Neurological Examination Mental Status: Awake and alert. Attention varies; at times distractable by extraneous stimuli. Some difficulty with speech comprehension is apparent. Occasional perseveration. Able to follow simple commands, some requiring frequent repetition. Able to name 2 out of 3 fingers presented to her. Mild dysarthria. Some dysfluency is noted. Oriented to day but not month or year. Correctly identifies the state but not the city. Knows that family wanted her evaluated, but feels that there is nothing wrong.  Cranial Nerves: II: Findings c/w partial cortical blindness including intermittent lack of fixate consistently, confabulation regarding visuospatial cues presented to her and lack of blink to threat consistently in temporal and nasal fields of right eye. 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 Right pupil is round and reactive.  III,IV, VI: No ptosis. Has difficulty with tracking, but able to move right eye to left and right.  V,VII: Smile symmetric, facial temp sensation equal bilaterally VIII: hearing intact to voice IX,X: No hypophonia XI: Symmetric XII: Midline tongue extension Motor: RUE and LUE 4+/5 with no drift.  Unable to lift BLE antigravity in the context of severe BLE edema. Dorsiflexion is 4+/5 bilaterally.  Sensory: Refuses temp sensation testing to limbs due to hypersensitivity to cold. FT intact to LUE, RUE and RLE, decreased to RLE.  Deep Tendon Reflexes: 2+ bilateral upper extremities. Exclaims painfully when  attempting to elicit patellar reflexes.  Plantars:  [place] bilaterally  Cerebellar: FNF with past pointing on the right. Severe optic ataxia when attempting FNF on the left: Unable to locate examiner's finger with left hand.  Gait: Deferred   Lab Results: Basic Metabolic Panel: Recent Labs  Lab 11/18/19 1405  NA 143  K 4.5  CL 107  CO2 23  GLUCOSE 168*  BUN 21  CREATININE 1.32*  CALCIUM 9.3    CBC: Recent Labs  Lab 11/18/19 1405  WBC 6.9  NEUTROABS 5.7  HGB 11.4*  HCT 37.7  MCV 85.3  PLT 218    Cardiac Enzymes: No results for input(s): CKTOTAL, CKMB, CKMBINDEX, TROPONINI in the last 168 hours.  Lipid Panel: No results for input(s): CHOL, TRIG, HDL, CHOLHDL, VLDL, LDLCALC in the last 168 hours.  Imaging: MR BRAIN WO CONTRAST  Result Date: 11/18/2019 CLINICAL DATA:  Encephalopathy EXAM: MRI HEAD WITHOUT CONTRAST TECHNIQUE: Multiplanar, multiecho pulse sequences of the brain and surrounding structures were obtained without intravenous contrast. COMPARISON:  None. FINDINGS: Brain: There are new areas of hyperintense T2-weighted signal within the right occipital and anterior right temporal white matter. There is no abnormal diffusion restriction. Normal white matter signal. Normal volume of CSF spaces. numerous chronic microhemorrhages in a predominantly peripheral distribution. Normal midline structures. Vascular: Normal flow voids. Skull and upper cervical spine: Normal marrow signal. Sinuses/Orbits: Negative. Other: None. IMPRESSION: 1. No acute ischemia or mass effect. 2. New areas of hyperintense T2-weighted signal within the right occipital and anterior right temporal white matter, which may be sequela of prior ischemia or trauma. However, this could also indicate vasogenic edema from metastatic disease. Further imaging with intravenous contrast is recommended to assess for the possibility of metastases. 3. Numerous chronic microhemorrhages in a predominantly peripheral  distribution most consistent with cerebral amyloid angiopathy. Electronically Signed   By: Ulyses Jarred M.D.   On: 11/18/2019 20:14   DG Chest Portable 1 View  Result Date: 11/18/2019 CLINICAL DATA:  Fever. EXAM: PORTABLE CHEST 1 VIEW COMPARISON:  PA and lateral chest 11/06/2016 and 05/31/2016. FINDINGS: The lungs are clear. Heart size is upper normal. Prominence of the pulmonary outflow tract compatible with pulmonary arterial hypertension noted. Atherosclerotic vascular disease is seen. No pneumothorax or pleural effusion. No acute or focal bony abnormality. IMPRESSION: No acute disease. Findings suggestive of pulmonary arterial hypertension. Atherosclerosis. Electronically Signed   By: Inge Rise M.D.   On: 11/18/2019 14:42    Assessment:  86 year old female with AMS and acute to subacute vasogenic edema involving the right occipito-temporal region on MRI.  1. Exam findings include sensory deficit to LLE and severe optic ataxia on the left. The latter finding suggests a disconnection syndrome due to the right occipital lobe lesion seen on MRI, with possible interruption of transmission via the fibers from the right hemisphere occipito-temporo-parietal region to the corpus callosal connections to the left hemisphere. 2. MRI brain reveals new areas of hyperintense T2-weighted signal within the right occipital and anterior right temporal white matter, which appear most consistent with acute to subacute vasogenic edema. DDx includes atypical imaging presentation of PRES, glioma and metastatic disease. Also seen are numerous chronic microhemorrhages in a predominantly distributed in the occipital lobes cortically and subcortically; the microhemorrhages may represent an atypical posterior distribution of amyloid angiopathy versus hypertensive microhemorrhages or chronic imaging sequelae of prior head trauma with diffuse shear injury.    Recommendations: 1. Repeat MRI brain with pre and post contrast  T1 images to further assess the vasogenic edema discussed above.  2. Further Neurology recommendations pending post-contrast MRI results.     Electronically signed: Dr. Kerney Elbe 11/18/2019, 11:06 PM

## 2019-11-18 NOTE — ED Notes (Signed)
Nanavati MD at bedside

## 2019-11-18 NOTE — ED Provider Notes (Signed)
[facility] DEPT Provider Note   CSN: [id number] Arrival date & time: 11/18/19  1323     History Chief Complaint  Patient presents with   Altered Mental Status    [name] is a 86 y.o. female.  Patient is a 86 y/o F coming from home for AMS and concern for UTI by family members. She has a hx of CKD, morbid obestity, HTN, BLE edema, osteoarthritis. Patient reports that she has not symptoms and she wants to go home. Level 5 caveat due to AMS. Hx of UTIs causing AMS in the past.        Past Medical History:  Diagnosis Date   CAP (community acquired pneumonia) 09/21/2015   Chronic kidney disease, stage III (moderate)    Encounter for long-term (current) use of other medications    Glaucoma (increased eye pressure)    Head pain    Hypertension    Impaired glucose tolerance test    Insomnia, unspecified    Morbid obesity ([place])    Neck pain    Osteoarthrosis, unspecified whether generalized or localized, other specified sites    Other lymphedema    Pain in joint, multiple sites    Primary open-angle glaucoma(365.11)     Patient Active Problem List   Diagnosis Date Noted   PVD (peripheral vascular disease) ([place]) 12/19/2018   Mitral valve insufficiency 03/19/2017   Other spondylosis with radiculopathy, cervical region 03/14/2017   Cerumen impaction 12/04/2016   Generalized weakness 09/22/2015   Acidosis 09/22/2015   Acute encephalopathy 09/22/2015   Hyperglycemia 09/22/2015   Leukopenia 09/22/2015   CAP (community acquired pneumonia) 09/22/2015   Bilateral leg edema 09/22/2015   Constipation 09/22/2015   Snoring 09/13/2015   Glaucoma (increased eye pressure)    Hypertension    Neck pain    Head pain    Pain in joint, multiple sites    Impaired glucose tolerance test    Osteoarthrosis, unspecified whether generalized or localized, other specified sites    Morbid obesity ([place])    Medication  management    Other lymphedema    Insomnia, unspecified    Chronic kidney disease, stage III (moderate)    Primary open-angle glaucoma(365.11)     Past Surgical History:  Procedure Laterality Date   ABDOMINAL HYSTERECTOMY     EYE SURGERY       OB History   No obstetric history on file.     Family History  Problem Relation Age of Onset   Cancer Father     Social History   Tobacco Use   Smoking status: Never Smoker   Smokeless tobacco: Never Used  Substance Use Topics   Alcohol use: No    Alcohol/week: 0.0 standard drinks   Drug use: No    Home Medications Prior to Admission medications   Medication Sig Start Date End Date Taking? Authorizing Provider  acetaminophen (TYLENOL) 500 MG tablet Take 500 mg by mouth at bedtime.   Yes [provider]  alendronate (FOSAMAX) 35 MG tablet Take 1 tablet (35 mg total) by mouth every 7 (seven) days. Take with a full glass of water on an empty stomach. 03/24/19  Yes Edwards, Milford Cage, NP  ALOE VERA JUICE PO Take 6 oz by mouth daily.   Yes [provider]  aspirin EC 81 MG tablet Take 1 tablet (81 mg total) by mouth daily. 03/26/17  Yes Clent Demark, PA-C  [employer] VITAMIN C 500 MG tablet Take 500 mg by  mouth daily. 06/24/18  Yes [provider]  Cyanocobalamin (VITAMIN B-12 PO) Take 1 tablet by mouth daily.   Yes [provider]  docusate sodium (COLACE) 50 MG capsule Take 1 capsule (50 mg total) by mouth 2 (two) times daily. 03/26/17  Yes Clent Demark, PA-C  gabapentin (NEURONTIN) 100 MG capsule Take 1 tablets in AM and 1 tablet at lunch and continue 2 tablets at bedtime Patient taking differently: Take 100-200 mg by mouth See admin instructions. Take 1 tablets in AM and 1 tablet at lunch and continue 2 tablets at bedtime 08/11/19  Yes Edwards, Sharyn Lull P, NP  GARLIC PO Take 9,833 mg by mouth daily.    Yes [provider]  losartan (COZAAR) 50 MG tablet TAKE 1 TABLET BY  MOUTH EVERY DAY Patient taking differently: Take 50 mg by mouth daily.  09/29/19  Yes Kerin Perna, NP  Multiple Vitamin (MULTIVITAMIN WITH MINERALS) TABS Take 1 tablet by mouth daily.   Yes [provider]  Polyethyl Glycol-Propyl Glycol 0.4-0.3 % SOLN Place 1 drop into both eyes 3 (three) times daily as needed (for lubricating eyes).   Yes [provider]  spironolactone (ALDACTONE) 25 MG tablet Take 25 mg by mouth daily. 10/21/19  Yes [provider]  tetrahydrozoline 0.05 % ophthalmic solution Place 1 drop into both eyes daily as needed (eye irritation).   Yes [provider]  trolamine salicylate (ASPERCREME) 10 % cream Apply 1 application topically as needed for muscle pain.   Yes [provider]  amLODipine (NORVASC) 10 MG tablet Take 1 tablet (10 mg total) by mouth daily. Patient not taking: Reported on 11/18/2019 03/24/19   Kerin Perna, NP  furosemide (LASIX) 20 MG tablet Take 1 tablet (20 mg total) by mouth daily. Patient not taking: Reported on 11/18/2019 03/24/19   Kerin Perna, NP  loratadine (CLARITIN) 10 MG tablet Take 1 tablet (10 mg total) by mouth daily. Patient not taking: Reported on 11/18/2019 03/24/19   Kerin Perna, NP    Allergies    Penicillins and Tetanus toxoid  Review of Systems   Review of Systems  Unable to perform ROS: Mental status change  Constitutional: Negative for chills and fever.  HENT: Negative for ear pain and sore throat.   Eyes: Negative for pain and visual disturbance.  Respiratory: Negative for cough and shortness of breath.   Cardiovascular: Negative for chest pain and palpitations.  Gastrointestinal: Negative for abdominal pain and vomiting.  Genitourinary: Negative for dysuria and hematuria.  Musculoskeletal: Negative for arthralgias and back pain.  Skin: Negative for color change and rash.  Neurological: Negative for seizures and syncope.  All other systems reviewed and are  negative.   Physical Exam Updated Vital Signs BP (!) 147/68    Pulse 89    Temp 100 F (37.8 C) (Rectal)    Resp (!) 24    SpO2 97%   Physical Exam Vitals and nursing note reviewed.  Constitutional:      General: She is not in acute distress.    Appearance: Normal appearance. She is not ill-appearing, toxic-appearing or diaphoretic.  HENT:     Head: Normocephalic.     Nose: Nose normal.     Mouth/Throat:     Mouth: Mucous membranes are moist.     Pharynx: Oropharynx is clear.  Eyes:     Conjunctiva/sclera: Conjunctivae normal.  Cardiovascular:     Rate and Rhythm: Regular rhythm. Tachycardia present.  Heart sounds: Murmur present.  Pulmonary:     Effort: Pulmonary effort is normal.     Breath sounds: Normal breath sounds.  Abdominal:     General: Abdomen is flat.     Palpations: Abdomen is soft.     Tenderness: There is no abdominal tenderness. There is no guarding.  Musculoskeletal:     Right lower leg: Edema present.     Left lower leg: Edema present.     Comments: Significant BLE edema with brawny appearance  Skin:    General: Skin is warm and dry.     Findings: No erythema.  Neurological:     Mental Status: She is alert.  Psychiatric:        Mood and Affect: Mood normal.     ED Results / Procedures / Treatments   Labs (all labs ordered are listed, but only abnormal results are displayed) Labs Reviewed  COMPREHENSIVE METABOLIC PANEL - Abnormal; Notable for the following components:      Result Value   Glucose, Bld 168 (*)    Creatinine, Ser 1.32 (*)    AST 66 (*)    GFR calc non Af Amer 36 (*)    GFR calc Af Amer 42 (*)    All other components within normal limits  CBC WITH DIFFERENTIAL/PLATELET - Abnormal; Notable for the following components:   Hemoglobin 11.4 (*)    MCH 25.8 (*)    RDW 16.1 (*)    Lymphs Abs 0.5 (*)    All other components within normal limits  URINALYSIS, ROUTINE W REFLEX MICROSCOPIC - Abnormal; Notable for the following  components:   Ketones, ur 5 (*)    Protein, ur 100 (*)    All other components within normal limits  LACTIC ACID, PLASMA - Abnormal; Notable for the following components:   Lactic Acid, Venous 2.9 (*)    All other components within normal limits  LACTIC ACID, PLASMA - Abnormal; Notable for the following components:   Lactic Acid, Venous 2.9 (*)    All other components within normal limits  D-DIMER, QUANTITATIVE (NOT AT [facility]) - Abnormal; Notable for the following components:   D-Dimer, Quant 2.05 (*)    All other components within normal limits  BRAIN NATRIURETIC PEPTIDE - Abnormal; Notable for the following components:   B Natriuretic Peptide 296.3 (*)    All other components within normal limits  CULTURE, BLOOD (ROUTINE X 2)  CULTURE, BLOOD (ROUTINE X 2)  URINE CULTURE  APTT  PROTIME-INR  POC SARS CORONAVIRUS 2 AG -  ED    EKG None  Radiology MR BRAIN WO CONTRAST  Result Date: 11/18/2019 CLINICAL DATA:  Encephalopathy EXAM: MRI HEAD WITHOUT CONTRAST TECHNIQUE: Multiplanar, multiecho pulse sequences of the brain and surrounding structures were obtained without intravenous contrast. COMPARISON:  None. FINDINGS: Brain: There are new areas of hyperintense T2-weighted signal within the right occipital and anterior right temporal white matter. There is no abnormal diffusion restriction. Normal white matter signal. Normal volume of CSF spaces. numerous chronic microhemorrhages in a predominantly peripheral distribution. Normal midline structures. Vascular: Normal flow voids. Skull and upper cervical spine: Normal marrow signal. Sinuses/Orbits: Negative. Other: None. IMPRESSION: 1. No acute ischemia or mass effect. 2. New areas of hyperintense T2-weighted signal within the right occipital and anterior right temporal white matter, which may be sequela of prior ischemia or trauma. However, this could also indicate vasogenic edema from metastatic disease. Further imaging with intravenous contrast  is recommended to assess for the possibility  of metastases. 3. Numerous chronic microhemorrhages in a predominantly peripheral distribution most consistent with cerebral amyloid angiopathy. Electronically Signed   By: Ulyses Jarred M.D.   On: 11/18/2019 20:14   DG Chest Portable 1 View  Result Date: 11/18/2019 CLINICAL DATA:  Fever. EXAM: PORTABLE CHEST 1 VIEW COMPARISON:  PA and lateral chest 11/06/2016 and 05/31/2016. FINDINGS: The lungs are clear. Heart size is upper normal. Prominence of the pulmonary outflow tract compatible with pulmonary arterial hypertension noted. Atherosclerotic vascular disease is seen. No pneumothorax or pleural effusion. No acute or focal bony abnormality. IMPRESSION: No acute disease. Findings suggestive of pulmonary arterial hypertension. Atherosclerosis. Electronically Signed   By: Inge Rise M.D.   On: 11/18/2019 14:42    Procedures Procedures (including critical care time)  Medications Ordered in ED Medications  cefTRIAXone (ROCEPHIN) 1 g in sodium chloride 0.9 % 100 mL IVPB (0 g Intravenous Stopped 11/18/19 1641)  sodium chloride (PF) 0.9 % injection (has no administration in time range)  acetaminophen (TYLENOL) tablet 650 mg (650 mg Oral Given 11/18/19 1503)  sodium chloride 0.9 % bolus 250 mL (0 mLs Intravenous Stopped 11/18/19 1640)  iohexol (OMNIPAQUE) 350 MG/ML injection 100 mL (100 mLs Intravenous Contrast Given 11/18/19 1853)    ED Course  I have reviewed the triage vital signs and the nursing notes.  Pertinent labs & imaging results that were available during my care of the patient were reviewed by me and considered in my medical decision making (see chart for details).  Clinical Course as of Nov 17 2100  Tue Nov 17, 4173  5637 86 year old from home initially presenting for concerns of sepsis by family members.  Family reports that she has been more confused since this morning.  They were concerned for UTI.  She had a rectal temperature of 100.0  on arrival and was mildly tachycardic and tachypneic so code sepsis was initiated.  She was given 250 bolus of fluids and Rocephin.  Her work-up revealed a normal white count, no signs of UTI on urinalysis.  She did not have signs of infection on her chest x-ray.  She did have signs of pulmonary hypertension.  Also had significant lower extremity edema on exam.  After reviewing this work-up with Dr. Kathrynn Humble feel that patient is not septic.  Patient remained will be tachycardic and a D-dimer was positive so CTA was ordered.  Also concern for stroke given patient's continued confusion.  MRI of brain pending as well. I did speak with patients cardiologist Dr. Terrence Dupont who endoreed that is workup is reassuring from a cardiac standpoint patient can f/u outpatient with him. Patient refers outpatient follow up   [KM]  2100 Patient MRI showing ? Of old stroke vs old trauma or possibly metastatic dz. Patient continues to deny symptoms. Discussed findings with patient, patient daughter and Dr. Roslynn Amble all at bedside. Patient care signed to Dr. Roslynn Amble due to change of shift. Will consult with neuro about MRI findings.    [KM]    Clinical Course User Index [KM] Kristine Royal   MDM Rules/Calculators/A&P                        Clinical Impression: 1. Altered mental status, unspecified altered mental status type       Final Clinical Impression(s) / ED Diagnoses Final diagnoses:  Altered mental status, unspecified altered mental status type    Rx / DC Orders ED Discharge Orders    None  Kristine Royal 11/18/19 2103    Varney Biles, MD 11/20/19 [phone]

## 2019-11-18 NOTE — ED Notes (Signed)
Lab called to add on labs

## 2019-11-18 NOTE — Sepsis Progress Note (Signed)
Notified bedside nurse of need to draw repeat lactic acid after completion of fluid bolus.  

## 2019-11-18 NOTE — ED Notes (Signed)
Date and time results received: 11/18/19 3:14 PM  (use smartphrase ".now" to insert current time)  Test: Lactic Critical Value: 2.9  Name of Provider Notified: Claiborne Billings PA  Orders Received? Or Actions Taken?: Orders Received - See Orders for details

## 2019-11-18 NOTE — ED Notes (Signed)
IV team consulted for 20G IV for CTA. PA aware

## 2019-11-18 NOTE — Sepsis Progress Note (Signed)
Notified provider of need to order lactic acid since 2nd result of LA  still 2.9

## 2019-11-18 NOTE — ED Notes (Signed)
CT made aware that the pt has an IV at this time .

## 2019-11-18 NOTE — ED Notes (Signed)
Pt transported to CT ?

## 2019-11-18 NOTE — ED Triage Notes (Addendum)
Pt arrives [facility] from home. Per family: Pt has been altered the last few days. Family concerned for UTI. Edema noted in bilateral upper and lower extremities

## 2019-11-19 ENCOUNTER — Other Ambulatory Visit: Payer: Self-pay

## 2019-11-19 ENCOUNTER — Observation Stay (HOSPITAL_COMMUNITY): Payer: Medicare Other

## 2019-11-19 ENCOUNTER — Encounter (HOSPITAL_COMMUNITY): Payer: Self-pay | Admitting: Internal Medicine

## 2019-11-19 DIAGNOSIS — I129 Hypertensive chronic kidney disease with stage 1 through stage 4 chronic kidney disease, or unspecified chronic kidney disease: Secondary | ICD-10-CM | POA: Diagnosis not present

## 2019-11-19 DIAGNOSIS — Z7982 Long term (current) use of aspirin: Secondary | ICD-10-CM | POA: Diagnosis not present

## 2019-11-19 DIAGNOSIS — I10 Essential (primary) hypertension: Secondary | ICD-10-CM | POA: Diagnosis not present

## 2019-11-19 DIAGNOSIS — N183 Chronic kidney disease, stage 3 unspecified: Secondary | ICD-10-CM | POA: Diagnosis not present

## 2019-11-19 DIAGNOSIS — R2681 Unsteadiness on feet: Secondary | ICD-10-CM | POA: Diagnosis not present

## 2019-11-19 DIAGNOSIS — M6281 Muscle weakness (generalized): Secondary | ICD-10-CM | POA: Diagnosis not present

## 2019-11-19 DIAGNOSIS — K5900 Constipation, unspecified: Secondary | ICD-10-CM | POA: Diagnosis not present

## 2019-11-19 DIAGNOSIS — R600 Localized edema: Secondary | ICD-10-CM | POA: Diagnosis not present

## 2019-11-19 DIAGNOSIS — R739 Hyperglycemia, unspecified: Secondary | ICD-10-CM | POA: Diagnosis present

## 2019-11-19 DIAGNOSIS — G9340 Encephalopathy, unspecified: Secondary | ICD-10-CM | POA: Diagnosis not present

## 2019-11-19 DIAGNOSIS — E854 Organ-limited amyloidosis: Secondary | ICD-10-CM | POA: Diagnosis not present

## 2019-11-19 DIAGNOSIS — M25521 Pain in right elbow: Secondary | ICD-10-CM | POA: Diagnosis not present

## 2019-11-19 DIAGNOSIS — R0600 Dyspnea, unspecified: Secondary | ICD-10-CM | POA: Diagnosis not present

## 2019-11-19 DIAGNOSIS — M2550 Pain in unspecified joint: Secondary | ICD-10-CM | POA: Diagnosis not present

## 2019-11-19 DIAGNOSIS — I890 Lymphedema, not elsewhere classified: Secondary | ICD-10-CM | POA: Diagnosis not present

## 2019-11-19 DIAGNOSIS — I739 Peripheral vascular disease, unspecified: Secondary | ICD-10-CM | POA: Diagnosis not present

## 2019-11-19 DIAGNOSIS — Z79899 Other long term (current) drug therapy: Secondary | ICD-10-CM | POA: Diagnosis not present

## 2019-11-19 DIAGNOSIS — R109 Unspecified abdominal pain: Secondary | ICD-10-CM | POA: Diagnosis not present

## 2019-11-19 DIAGNOSIS — Z6832 Body mass index (BMI) 32.0-32.9, adult: Secondary | ICD-10-CM | POA: Diagnosis not present

## 2019-11-19 DIAGNOSIS — I340 Nonrheumatic mitral (valve) insufficiency: Secondary | ICD-10-CM | POA: Diagnosis not present

## 2019-11-19 DIAGNOSIS — I680 Cerebral amyloid angiopathy: Secondary | ICD-10-CM | POA: Diagnosis not present

## 2019-11-19 DIAGNOSIS — Z7401 Bed confinement status: Secondary | ICD-10-CM | POA: Diagnosis not present

## 2019-11-19 DIAGNOSIS — J069 Acute upper respiratory infection, unspecified: Secondary | ICD-10-CM | POA: Diagnosis not present

## 2019-11-19 DIAGNOSIS — R7989 Other specified abnormal findings of blood chemistry: Secondary | ICD-10-CM | POA: Diagnosis not present

## 2019-11-19 DIAGNOSIS — E875 Hyperkalemia: Secondary | ICD-10-CM | POA: Diagnosis not present

## 2019-11-19 DIAGNOSIS — I69828 Other speech and language deficits following other cerebrovascular disease: Secondary | ICD-10-CM | POA: Diagnosis not present

## 2019-11-19 DIAGNOSIS — R4182 Altered mental status, unspecified: Secondary | ICD-10-CM | POA: Diagnosis present

## 2019-11-19 DIAGNOSIS — Z743 Need for continuous supervision: Secondary | ICD-10-CM | POA: Diagnosis not present

## 2019-11-19 DIAGNOSIS — D649 Anemia, unspecified: Secondary | ICD-10-CM | POA: Diagnosis present

## 2019-11-19 DIAGNOSIS — N179 Acute kidney failure, unspecified: Secondary | ICD-10-CM | POA: Diagnosis not present

## 2019-11-19 DIAGNOSIS — G936 Cerebral edema: Secondary | ICD-10-CM | POA: Diagnosis not present

## 2019-11-19 DIAGNOSIS — M4722 Other spondylosis with radiculopathy, cervical region: Secondary | ICD-10-CM | POA: Diagnosis not present

## 2019-11-19 DIAGNOSIS — E872 Acidosis: Secondary | ICD-10-CM | POA: Diagnosis not present

## 2019-11-19 DIAGNOSIS — E8809 Other disorders of plasma-protein metabolism, not elsewhere classified: Secondary | ICD-10-CM | POA: Diagnosis not present

## 2019-11-19 DIAGNOSIS — Z90710 Acquired absence of both cervix and uterus: Secondary | ICD-10-CM | POA: Diagnosis not present

## 2019-11-19 DIAGNOSIS — D631 Anemia in chronic kidney disease: Secondary | ICD-10-CM | POA: Diagnosis not present

## 2019-11-19 DIAGNOSIS — Z20822 Contact with and (suspected) exposure to covid-19: Secondary | ICD-10-CM | POA: Diagnosis not present

## 2019-11-19 DIAGNOSIS — R609 Edema, unspecified: Secondary | ICD-10-CM | POA: Diagnosis not present

## 2019-11-19 DIAGNOSIS — Z87440 Personal history of urinary (tract) infections: Secondary | ICD-10-CM | POA: Diagnosis not present

## 2019-11-19 DIAGNOSIS — G9341 Metabolic encephalopathy: Secondary | ICD-10-CM | POA: Diagnosis not present

## 2019-11-19 DIAGNOSIS — R7303 Prediabetes: Secondary | ICD-10-CM | POA: Diagnosis present

## 2019-11-19 DIAGNOSIS — E876 Hypokalemia: Secondary | ICD-10-CM | POA: Diagnosis not present

## 2019-11-19 DIAGNOSIS — R29898 Other symptoms and signs involving the musculoskeletal system: Secondary | ICD-10-CM | POA: Diagnosis not present

## 2019-11-19 DIAGNOSIS — R2689 Other abnormalities of gait and mobility: Secondary | ICD-10-CM | POA: Diagnosis not present

## 2019-11-19 LAB — GLUCOSE, CAPILLARY
Glucose-Capillary: 101 mg/dL — ABNORMAL HIGH (ref 70–99)
Glucose-Capillary: 99 mg/dL (ref 70–99)
Glucose-Capillary: 115 mg/dL — ABNORMAL HIGH (ref 70–99)

## 2019-11-19 LAB — CBC WITH DIFFERENTIAL/PLATELET
Abs Immature Granulocytes: 0.01 10*3/uL (ref 0.00–0.07)
Basophils Absolute: 0 10*3/uL (ref 0.0–0.1)
Basophils Relative: 0 %
Eosinophils Absolute: 0 10*3/uL (ref 0.0–0.5)
Eosinophils Relative: 0 %
HCT: 35.3 % — ABNORMAL LOW (ref 36.0–46.0)
Hemoglobin: 10.8 g/dL — ABNORMAL LOW (ref 12.0–15.0)
Immature Granulocytes: 0 %
Lymphocytes Relative: 12 %
Lymphs Abs: 0.8 10*3/uL (ref 0.7–4.0)
MCH: 25.8 pg — ABNORMAL LOW (ref 26.0–34.0)
MCHC: 30.6 g/dL (ref 30.0–36.0)
MCV: 84.2 fL (ref 80.0–100.0)
Monocytes Absolute: 0.9 10*3/uL (ref 0.1–1.0)
Monocytes Relative: 13 %
Neutro Abs: 4.8 10*3/uL (ref 1.7–7.7)
Neutrophils Relative %: 75 %
Platelets: 177 10*3/uL (ref 150–400)
RBC: 4.19 MIL/uL (ref 3.87–5.11)
RDW: 16 % — ABNORMAL HIGH (ref 11.5–15.5)
WBC: 6.5 10*3/uL (ref 4.0–10.5)
nRBC: 0 % (ref 0.0–0.2)

## 2019-11-19 LAB — COMPREHENSIVE METABOLIC PANEL
ALT: 22 U/L (ref 0–44)
AST: 48 U/L — ABNORMAL HIGH (ref 15–41)
Albumin: 2.9 g/dL — ABNORMAL LOW (ref 3.5–5.0)
Alkaline Phosphatase: 69 U/L (ref 38–126)
Anion gap: 10 (ref 5–15)
BUN: 18 mg/dL (ref 8–23)
CO2: 23 mmol/L (ref 22–32)
Calcium: 8.5 mg/dL — ABNORMAL LOW (ref 8.9–10.3)
Chloride: 110 mmol/L (ref 98–111)
Creatinine, Ser: 1.06 mg/dL — ABNORMAL HIGH (ref 0.44–1.00)
GFR calc Af Amer: 55 mL/min — ABNORMAL LOW (ref >60)
GFR calc non Af Amer: 47 mL/min — ABNORMAL LOW (ref >60)
Glucose, Bld: 118 mg/dL — ABNORMAL HIGH (ref 70–99)
Potassium: 4.3 mmol/L (ref 3.5–5.1)
Sodium: 143 mmol/L (ref 135–145)
Total Bilirubin: 1.3 mg/dL — ABNORMAL HIGH (ref 0.3–1.2)
Total Protein: 6.3 g/dL — ABNORMAL LOW (ref 6.5–8.1)

## 2019-11-19 LAB — SARS CORONAVIRUS 2 (TAT 6-24 HRS): SARS Coronavirus 2: NEGATIVE

## 2019-11-19 LAB — CK: Total CK: 1074 U/L — ABNORMAL HIGH (ref 38–234)

## 2019-11-19 LAB — LACTIC ACID, PLASMA
Lactic Acid, Venous: 1.3 mmol/L (ref 0.5–1.9)
Lactic Acid, Venous: 1.5 mmol/L (ref 0.5–1.9)

## 2019-11-19 LAB — TROPONIN I (HIGH SENSITIVITY): Troponin I (High Sensitivity): 56 ng/L — ABNORMAL HIGH (ref <18)

## 2019-11-19 LAB — TSH: TSH: 1.775 u[IU]/mL (ref 0.350–4.500)

## 2019-11-19 LAB — AMMONIA: Ammonia: 50 umol/L — ABNORMAL HIGH (ref 9–35)

## 2019-11-19 MED ORDER — SODIUM CHLORIDE 0.9 % IV SOLN: INTRAVENOUS | Status: DC

## 2019-11-19 MED ORDER — GADOBUTROL 1 MMOL/ML IV SOLN
7.0000 mL | Freq: Once | INTRAVENOUS | Status: AC | PRN
  Administered 2019-11-19: 7 mL via INTRAVENOUS

## 2019-11-19 MED ORDER — ONDANSETRON HCL 4 MG/2ML IJ SOLN: 4.0000 mg | Freq: Four times a day (QID) | INTRAMUSCULAR | Status: DC | PRN

## 2019-11-19 MED ORDER — ONDANSETRON HCL 4 MG PO TABS: 4.0000 mg | ORAL_TABLET | Freq: Four times a day (QID) | ORAL | Status: DC | PRN

## 2019-11-19 MED ORDER — ENOXAPARIN SODIUM 40 MG/0.4ML ~~LOC~~ SOLN
40.0000 mg | SUBCUTANEOUS | Status: DC
  Administered 2019-11-19: 40 mg via SUBCUTANEOUS
  Filled 2019-11-19: qty 0.4

## 2019-11-19 MED ORDER — SODIUM CHLORIDE 0.9 % IV SOLN: INTRAVENOUS | Status: AC

## 2019-11-19 MED ORDER — ACETAMINOPHEN 10 MG/ML IV SOLN
1000.0000 mg | Freq: Once | INTRAVENOUS | Status: AC
  Filled 2019-11-19: qty 100

## 2019-11-19 MED ORDER — HYDRALAZINE HCL 20 MG/ML IJ SOLN: 10.0000 mg | INTRAMUSCULAR | Status: DC | PRN

## 2019-11-19 MED ORDER — ACETAMINOPHEN 650 MG RE SUPP
650.0000 mg | Freq: Once | RECTAL | Status: AC
  Administered 2019-11-19: 650 mg via RECTAL
  Filled 2019-11-19: qty 1

## 2019-11-19 MED ORDER — SODIUM CHLORIDE (PF) 0.9 % IJ SOLN
INTRAMUSCULAR | Status: AC
  Filled 2019-11-19: qty 50

## 2019-11-19 NOTE — Evaluation (Signed)
SLP Cancellation Note  Patient Details Name: MARGEE TRENTHAM MRN: [id number] DOB: 1929/10/24   Cancelled treatment:       Reason Eval/Treat Not Completed: Other (comment)(pt getting ready to go to MRI - note order for MRI with contrast brain, will continue efforts)   Macario Golds 11/19/2019, 8:33 AM   Kathleen Lime, MS [place] Office [phone]

## 2019-11-19 NOTE — Progress Notes (Signed)
Care started prior to midnight in the emergency room and patient was admitted early this morning after midnight by my partner colleague Dr. Maretta Bees and I am in current agreement with his assessment and plan.  Additional changes to the plan of care been made accordingly.  The patient is an 86 year old African-American female with past medical history significant for but not limited to history of CKD, morbid obesity, hypertension, bilateral lower extremity leg edema as well as osteoarthritis who presented to [facility] via EMS for evaluation of AMS which has been going on for last 2 days.  Family was worried about a UTI as UTIs caused her AMS in the past.  She is noted to have bilateral lower extremity edema in the ED and her lactate was elevated.  Her urinalysis however was negative as well as her chest x-ray.  A CT of the head was obtained revealing a new areas of hyperintense T2 weighted signal within the right occipital and right anterior temporal white matter.  Imaging was recommended with IV contrast with MRI and this was done.  Neurology was consulted for further evaluation recommendations and they initially recommended repeating the MRI of the brain with contrast.  I spoke with Dr. Amie Portland who is going to be discussing the case with some of his colleagues.  Repeat MRI showed  "Findings consistent with either amyloid beta related angiitis or inflammatory cerebral amyloid angiopathy. No evidence of metastatic disease. I doubt the diagnoses of atypical PRES, prior ischemic infarction, prior trauma or PML, which were considered." Her lactic acidosis has improved and she is much more awake and alert today and she was reportedly.  SLP came to evaluate the patient and they are recommending a regular diet with thin liquids.  Patient's renal function is improving however her CK was elevated on admission.  We will continue IV fluid hydration with normal saline at 75 mL's per hour for next 12 hours and  obtain a PT and OT consultation.  She is currently being admitted and treated for the following but not limited to:  Acute Encephalopathy with abnormal MRI brain  -Dr. Hal Hope discussed with neurologist.   -At this time plan is to get MRI brain with contrast to have further plans.   -There was concern for metastatic disease.  -Other differentials include-ischemic cause.   -Per patient's daughter patient did not have any recent trauma she did hit her head about 6 years ago.  Nothing recently.  Will closely monitor.   -Patient had some difficulty swallowing get speech therapist evaluation.  -In addition we will check ammonia TSH B12; ammonia was elevated at 50 and was within normal limits at 1.775 -Patient was found to have an elevated CK and will be continued on IV fluid hydration gently with normal saline at 75 MLS per hour  Hypertension  -we will keep patient on as needed IV hydralazine until patient passes swallow.  Lower Extremity Edema  -On diuretics which will be on hold for now.  Chronic Kidney Disease Stage III -reatinine appears to be at baseline.  We will gently hydrate and see if GFR improves before MRI brain with contrast. -BUN/Cr is trending down   Normocytic Anemia  -Appears to be chronic.   -Follow CBC.   -Will check anemia panel. -Continue to monitor for signs and symptoms of bleeding; currently no overt bleeding noted -Repeat CBC in a.m.  Obesity -Estimated body mass index is 32.58 kg/m as calculated from the following:   Height as of  this encounter: 4\' 10"  (1.473 m).   Weight as of this encounter: 70.7 kg. -Weight Loss and Dietary Counseling given   We will continue to monitor patient's clinical response to intervention and repeat blood work in the a.m. f and will also follow-up on Neurology recommendations

## 2019-11-19 NOTE — Evaluation (Signed)
Clinical/Bedside Swallow Evaluation Patient Details  Name: [name] MRN: [id number] Date of Birth: 1929-10-23  Today's Date: 11/19/2019 Time: SLP Start Time (ACUTE ONLY): 5 SLP Stop Time (ACUTE ONLY): 1125 SLP Time Calculation (min) (ACUTE ONLY): 55 min  Past Medical History:  Past Medical History:  Diagnosis Date  . CAP (community acquired pneumonia) 09/21/2015  . Chronic kidney disease, stage III (moderate)   . Encounter for long-term (current) use of other medications   . Glaucoma (increased eye pressure)   . Head pain   . Hypertension   . Impaired glucose tolerance test   . Insomnia, unspecified   . Morbid obesity ([place])   . Neck pain   . Osteoarthrosis, unspecified whether generalized or localized, other specified sites   . Other lymphedema   . Pain in joint, multiple sites   . Primary open-angle glaucoma(365.11)    Past Surgical History:  Past Surgical History:  Procedure Laterality Date  . ABDOMINAL HYSTERECTOMY    . EYE SURGERY     HPI:  Per MD notes "[name] is a 86 y.o. female with history of hypertension chronic lower extremity edema was brought to the ER after patient's daughter found the patient was confused yesterday when patient's daughter visited the patient at around 19 AM.  Patient was sitting confused with not wearing anything waist below.  Patient was repeating certain words which was not making any sense.  Follow difficult to walk with some ataxic gait.  Per patient's daughter patient was normal the previous night.  Patient did not have any nausea vomiting diarrhea chest pain shortness of breath headache or fever or chills.  No new medications. ED Course: In the ER on exam patient appears to have difficulty moving her lower extremities and was initially mildly confused and repeating words.  Patient's temperature was around 99 F UA was unremarkable CT angiogram of the chest was negative for anything acute.  EKG was showing sinus tachycardia.  Blood  cultures and urine cultures were obtained concerning for sepsis initiated lactic acid was elevated.  CBC was unremarkable creatinine 1.3 BNP 296 D-dimer was 2.05 AST 66.  Hemoglobin of 1.4.  Covid test pending.  MRI of the brain done shows abnormality in the right occipital and temporal area concerning for sequelae of old stroke or trauma or could be metastatic disease and requested MRI brain with contrast.  Neurology was consulted and patient admitted for further management."  CT Chest 11/18/2019 negative for acute changes, cardiomegaly, pulmonary HTN, athersclerosis.   Assessment / Plan / Recommendation Clinical Impression  Functional oropharyngeal swallow clinically present despite pt's h/o a cleft palate s/p surgical intervention. Pt's speech is hypernasal due to inadequate vp closure - pt lacks a uvula and portion of soft palate.  She was able to conceal air in her oral cavity without loss surprisingly.  Pt does admit to occasional issues with nasal loss of liquids however states this is rare.  She passed a 3 ounce water test easily.    Given pt without weight loss, nor recurrent pulmonary infections, suspect she is managing her mild chronic dysphagia.  She was observed consuming 4 ounces water *(inclusive of 3 ounce water test), a full container of pudding and 4 graham crackers.  Pt masticates well without evidence nor signs/symptoms of oropharyngeal retention.    SLP reviewed general aspiration precautions with pt and her daughter [name].  No Follow up re: swallowing ability.  Recommend consider a speech/language evaluation given pt admitted with confusion and  word finding deficits. SLP Visit Diagnosis: Dysphagia, unspecified (R13.10);Dysphagia, oral phase (R13.11)    Aspiration Risk  Mild aspiration risk    Diet Recommendation Regular;Thin liquid   Liquid Administration via: Cup;Straw Medication Administration: Whole meds with liquid Supervision: Patient able to self feed Compensations:  Slow rate;Small sips/bites Postural Changes: Seated upright at 90 degrees;Remain upright for at least 30 minutes after po intake    Other  Recommendations Oral Care Recommendations: Oral care BID   Follow up Recommendations None      Frequency and Duration     n/a       Prognosis     n/a   Swallow Study   General Date of Onset: 11/19/19 HPI: Per MD notes "[name] is a 86 y.o. female with history of hypertension chronic lower extremity edema was brought to the ER after patient's daughter found the patient was confused yesterday when patient's daughter visited the patient at around 90 AM.  Patient was sitting confused with not wearing anything waist below.  Patient was repeating certain words which was not making any sense.  Follow difficult to walk with some ataxic gait.  Per patient's daughter patient was normal the previous night.  Patient did not have any nausea vomiting diarrhea chest pain shortness of breath headache or fever or chills.  No new medications. ED Course: In the ER on exam patient appears to have difficulty moving her lower extremities and was initially mildly confused and repeating words.  Patient's temperature was around 99 F UA was unremarkable CT angiogram of the chest was negative for anything acute.  EKG was showing sinus tachycardia.  Blood cultures and urine cultures were obtained concerning for sepsis initiated lactic acid was elevated.  CBC was unremarkable creatinine 1.3 BNP 296 D-dimer was 2.05 AST 66.  Hemoglobin of 1.4.  Covid test pending.  MRI of the brain done shows abnormality in the right occipital and temporal area concerning for sequelae of old stroke or trauma or could be metastatic disease and requested MRI brain with contrast.  Neurology was consulted and patient admitted for further management."  CT Chest 11/18/2019 negative for acute changes, cardiomegaly, pulmonary HTN, athersclerosis. Type of Study: Bedside Swallow Evaluation Previous Swallow  Assessment: none in system Diet Prior to this Study: NPO Temperature Spikes Noted: No Respiratory Status: Room air History of Recent Intubation: No Behavior/Cognition: Alert;Cooperative;Pleasant mood Oral Cavity Assessment: Within Functional Limits Oral Cavity - Dentition: Missing dentition;Poor condition(some missing) Vision: Functional for self-feeding Self-Feeding Abilities: Able to feed self Patient Positioning: Upright in bed Baseline Vocal Quality: Low vocal intensity Volitional Cough: Strong Volitional Swallow: Able to elicit    Oral/Motor/Sensory Function Overall Oral Motor/Sensory Function: Mild impairment Facial ROM: Within Functional Limits Facial Symmetry: Abnormal symmetry right Facial Strength: Within Functional Limits Facial Sensation: Within Functional Limits Lingual ROM: Within Functional Limits Lingual Symmetry: Within Functional Limits Lingual Strength: Within Functional Limits Lingual Sensation: Within Functional Limits Velum: Other (comment)(deviates to the right upon phonation and portion absent, pt has h/o cleft palate at birth s/p surgery) Mandible: Within Functional Limits   Tran Chips Tran chips: Not tested   Thin Liquid Thin Liquid: Within functional limits Presentation: Self Fed;Straw    Nectar Thick Nectar Thick Liquid: Not tested   Honey Thick Honey Thick Liquid: Not tested   Puree Puree: Within functional limits Presentation: Self Fed;Spoon   Solid     Solid: Within functional limits Presentation: Self Fed      Macario Golds 11/19/2019,12:02 PM  Kathleen Lime, MS [place] Office [phone]

## 2019-11-19 NOTE — Evaluation (Signed)
Occupational Therapy Evaluation Patient Details Name: [name] MRN: [id number] DOB: [birth year] Today's Date: 11/19/2019    History of Present Illness 86 y.o. female with history of hypertension chronic lower extremity edema was brought to the ER after patient's daughter found the patient was confused. MRI of brain: Findings consistent with either amyloid beta related angiitis or inflammatory cerebral amyloid angiopathy. No evidence of metastatic disease.   Clinical Impression   On evaluation patient presents with generalized weakness, impaired balance, visual deficits, questionable cognitive deficits with some difficulty with speech resulting in decreased ability to perform functional transfers, ambulation, and ADLs. Patient will benefit from skilled OT services to improve deficits and improve functional abilities. Patient will benefit from short term rehab at discharge.     Follow Up Recommendations  SNF    Equipment Recommendations  3 in 1 bedside commode    Recommendations for Other Services       Precautions / Restrictions Precautions Precautions: Fall Precaution Comments: Patient reports falls "all over the place" but doesn't report timing of falls Restrictions Weight Bearing Restrictions: No      Mobility Bed Mobility Overal bed mobility: Needs Assistance Bed Mobility: Supine to Sit Rolling: Mod assist Sidelying to sit: Mod assist Supine to sit: Max assist Sit to supine: Max assist   General bed mobility comments: assist to initiate movement, assist to advance BLEs and to raise trunk  Transfers Overall transfer level: Needs assistance Equipment used: Rolling walker (2 wheeled) Transfers: Sit to/from [facility] Sit to Stand: Mod assist Stand pivot transfers: +2 safety/equipment       General transfer comment: Transfer to [facility], then back to bed using RW and assistance of two for safety. Exhibited difficulty taking steps more on right than  left    Balance Overall balance assessment: History of Falls;Needs assistance Sitting-balance support: Feet supported;Single extremity supported Sitting balance-Leahy Scale: Good     Standing balance support: Bilateral upper extremity supported Standing balance-Leahy Scale: Poor Standing balance comment: reliant upon BUE support                           ADL either performed or assessed with clinical judgement   ADL Overall ADL's : Needs assistance/impaired Eating/Feeding: Set up;Supervision/ safety Eating/Feeding Details (indicate cue type and reason): limited by vision Grooming: Set up;Sitting;Supervision/safety Grooming Details (indicate cue type and reason): limited by vision and poor depth perception Upper Body Bathing: Set up   Lower Body Bathing: Maximal assistance;Sit to/from stand   Upper Body Dressing : Set up   Lower Body Dressing: Maximal assistance   Toilet Transfer: RW;+2 for safety/equipment;Moderate assistance;BSC Toilet Transfer Details (indicate cue type and reason): able to take steps to bedside commode using RW - very slowly Toileting- Clothing Manipulation and Hygiene: Maximal assistance       Functional mobility during ADLs: +2 for safety/equipment;Moderate assistance       Vision Baseline Vision/History: Legally blind Patient Visual Report: Other (comment)(Patient reports no visual changes. 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 Pain Descriptors / Indicators: Grimacing Pain Intervention(s): Limited activity within patient's tolerance;Monitored during session     Hand Dominance Right   Extremity/Trunk Assessment Upper  Extremity Assessment Upper Extremity  Assessment: Overall WFL for tasks assessed(bilateral shoulder weakness 4-/5)   Lower Extremity Assessment Lower Extremity Assessment: Defer to PT evaluation   Cervical / Trunk Assessment Cervical / Trunk Assessment: Kyphotic   Communication Communication Communication: HOH;Expressive difficulties   Cognition Arousal/Alertness: Awake/alert Behavior During Therapy: Flat affect;Anxious Overall Cognitive Status: Impaired/Different from baseline Area of Impairment: Following commands;Safety/judgement;Awareness;Problem solving;Attention;Memory;Orientation                 Orientation Level: Person;Place Current Attention Level: Sustained Memory: Decreased short-term memory Following Commands: Follows one step commands with increased time Safety/Judgement: Decreased awareness of safety Awareness: Emergent Problem Solving: Slow processing;Decreased initiation;Requires verbal cues General Comments: increased processing time, decreased awareness of deficits, can follow 1 step commands with increased time   General Comments       Exercises     Shoulder Instructions      Home Living Family/patient expects to be discharged to:: Skilled nursing facility(Patient wants to return home. Therapist and patient discussed need for short term rehab at discharge.) Living Arrangements: Alone Available Help at Discharge: Family;Available PRN/intermittently   Home Access: Stairs to enter Entrance Stairs-Number of Steps: 6   Home Layout: One level     Bathroom Shower/Tub: Tub/shower unit         Home Equipment: Environmental consultant - 2 wheels;Cane - single point;Grab bars - tub/shower;Shower seat   Additional Comments: Patient interviewed with daughter present. Giving therapist different answers.      Prior Functioning/Environment Level of Independence: Independent with assistive device(s)        Comments: Patient reports she was able to perform dressing, toileting, minimal IADLs. Reports  performing sponge baths, using slip on shoes and sleeping predominantly in a recliner.        OT Problem List: Decreased strength;Impaired vision/perception;Decreased knowledge of use of DME or AE;Decreased cognition;Obesity;Decreased safety awareness;Impaired balance (sitting and/or standing);Pain      OT Treatment/Interventions: Self-care/ADL training;Therapeutic exercise;Patient/family education;Therapeutic activities;Balance training;DME and/or AE instruction;Cognitive remediation/compensation;Neuromuscular education;Visual/perceptual remediation/compensation    OT Goals(Current goals can be found in the care plan section) Acute Rehab OT Goals Patient Stated Goal: patient wants to go home OT Goal Formulation: With patient Time For Goal Achievement: 12/03/19 Potential to Achieve Goals: Fair  OT Frequency: Min 2X/week   Barriers to D/C: Inaccessible home environment          Co-evaluation              AM-PAC OT "6 Clicks" Daily Activity     Outcome Measure Help from another person eating meals?: A Little Help from another person taking care of personal grooming?: A Little Help from another person toileting, which includes using toliet, bedpan, or urinal?: A Lot Help from another person bathing (including washing, rinsing, drying)?: A Lot Help from another person to put on and taking off regular upper body clothing?: A Little Help from another person to put on and taking off regular lower body clothing?: A Lot 6 Click Score: 15   End of Session Equipment Utilized During Treatment: Gait belt;Rolling walker Nurse Communication: Mobility status  Activity Tolerance: Patient tolerated treatment well Patient left: in bed;with call bell/phone within reach;with bed alarm set;with family/visitor present  OT Visit Diagnosis: History of falling (Z91.81);Low vision, both eyes (H54.2);Other abnormalities of gait and mobility (R26.89);Muscle weakness (generalized) (M62.81);Other  symptoms and signs involving cognitive function                Time: 0867-6195 OT Time Calculation (min): 60 min  Charges:  OT General Charges $OT Visit: 1 Visit OT Evaluation $OT Eval Moderate Complexity: 1 Mod OT Treatments $Self Care/Home Management : 38-52 mins  [name], OTR/L Acute Care Rehab Services  Office [phone]  [name] 11/19/2019, 5:25 PM

## 2019-11-19 NOTE — Evaluation (Addendum)
Physical Therapy Evaluation Patient Details Name: [name] MRN: [id number] DOB: March 13, 1930 Today's Date: 11/19/2019   History of Present Illness  86 y.o. female with history of hypertension chronic lower extremity edema was brought to the ER after patient's daughter found the patient was confused. MRI of brain: Findings consistent with either amyloid beta related angiitis or inflammatory cerebral amyloid angiopathy. No evidence of metastatic disease.   Clinical Impression  Pt admitted with above diagnosis. Pt requires mod assist for bed mobility and for sit to stand with RW. She was unable to ambulate. ST-SNF recommended. Pt currently with functional limitations due to the deficits listed below (see PT Problem List). Pt will benefit from skilled PT to increase their independence and safety with mobility to allow discharge to the venue listed below.       Follow Up Recommendations SNF;Supervision/Assistance - 24 hour    Equipment Recommendations  Wheelchair cushion (measurements PT);Wheelchair (measurements PT)    Recommendations for Other Services       Precautions / Restrictions Precautions Precautions: Fall Precaution Comments: pt denies falls in the past 1 year Restrictions Weight Bearing Restrictions: No      Mobility  Bed Mobility Overal bed mobility: Needs Assistance Bed Mobility: Rolling;Sidelying to Sit;Sit to Supine Rolling: Mod assist Sidelying to sit: Mod assist   Sit to supine: Total assist   General bed mobility comments: assist to initiate movement, assist to advance BLEs and to raise trunk  Transfers Overall transfer level: Needs assistance Equipment used: Rolling walker (2 wheeled) Transfers: Sit to/from Stand Sit to Stand: Mod assist         General transfer comment: sit to stand x 2 trials with RW, pt stood with forward flexed trunk, unable to take any steps, unable to lift either foot off of floor; pt stood for ~60 seconds x 2 for pericare  (large BM in bed at start of session)  Ambulation/Gait             General Gait Details: unable  Stairs            Wheelchair Mobility    Modified Rankin (Stroke Patients Only)       Balance Overall balance assessment: Needs assistance Sitting-balance support: Feet supported;Single extremity supported Sitting balance-Leahy Scale: Fair     Standing balance support: Bilateral upper extremity supported Standing balance-Leahy Scale: Poor Standing balance comment: reliant upon BUE support                             Pertinent Vitals/Pain Pain Assessment: Faces Faces Pain Scale: Hurts little more Pain Location: back Pain Descriptors / Indicators: Grimacing Pain Intervention(s): Limited activity within patient's tolerance;Monitored during session;Repositioned    Home Living Family/patient expects to be discharged to:: Private residence Living Arrangements: Alone Available Help at Discharge: Family;Available PRN/intermittently   Home Access: Stairs to enter   Entrance Stairs-Number of Steps: 1 Home Layout: One level Home Equipment: Walker - 2 wheels;Cane - single point;Grab bars - tub/shower;Shower seat      Prior Function Level of Independence: Independent with assistive device(s)         Comments: walked with [facility] PTA, showered using seat, will DC to granddaughter's home; lived alone PTA     Hand Dominance        Extremity/Trunk Assessment   Upper Extremity Assessment Upper Extremity Assessment: Defer to OT evaluation    Lower Extremity Assessment Lower Extremity Assessment: Generalized weakness(B knee ext -4/5)  Cervical / Trunk Assessment Cervical / Trunk Assessment: Kyphotic  Communication   Communication: HOH  Cognition Arousal/Alertness: Awake/alert Behavior During Therapy: WFL for tasks assessed/performed Overall Cognitive Status: Impaired/Different from baseline Area of Impairment: Problem solving;Safety/judgement                          Safety/Judgement: Decreased awareness of deficits   Problem Solving: Slow processing;Decreased initiation General Comments: increased processing time, decreased awareness of deficits, can follow 1 step commands with increased time      General Comments      Exercises     Assessment/Plan    PT Assessment Patient needs continued PT services  PT Problem List Decreased strength;Decreased activity tolerance;Decreased mobility;Decreased cognition;Decreased safety awareness       PT Treatment Interventions      PT Goals (Current goals can be found in the Care Plan section)  Acute Rehab PT Goals Patient Stated Goal: to get stronger, be able to walk PT Goal Formulation: With patient/family Time For Goal Achievement: 12/03/19 Potential to Achieve Goals: Fair    Frequency Min 2X/week   Barriers to discharge        Co-evaluation               AM-PAC PT "6 Clicks" Mobility  Outcome Measure Help needed turning from your back to your side while in a flat bed without using bedrails?: A Lot Help needed moving from lying on your back to sitting on the side of a flat bed without using bedrails?: A Lot Help needed moving to and from a bed to a chair (including a wheelchair)?: A Lot Help needed standing up from a chair using your arms (e.g., wheelchair or bedside chair)?: A Lot Help needed to walk in hospital room?: Total Help needed climbing 3-5 steps with a railing? : Total 6 Click Score: 10    End of Session Equipment Utilized During Treatment: Gait belt Activity Tolerance: Patient tolerated treatment well Patient left: in bed;with call bell/phone within reach;with bed alarm set;with family/visitor present Nurse Communication: Mobility status(need for linen change) PT Visit Diagnosis: Difficulty in walking, not elsewhere classified (R26.2);Muscle weakness (generalized) (M62.81);Other abnormalities of gait and mobility (R26.89)    Time:  1165-7903 PT Time Calculation (min) (ACUTE ONLY): 37 min   Charges:   PT Evaluation $PT Eval Low Complexity: 1 Low $PT Eval Moderate Complexity: 1 Mod PT Treatments $Therapeutic Activity: 8-22 mins        Blondell Reveal Kistler PT 11/19/2019  Acute Rehabilitation Services Pager [phone] Office [phone]

## 2019-11-19 NOTE — Plan of Care (Signed)
Same day chart review and imaging review note  MRI reviewed and d/w neurorads and stroke neurologist on call as well.  No enhancement seen.  Vasogenic edema in the occipital cortex and temporal cortex unilaterally with susceptibility weighted images showing multiple foci of hemosiderin deposit peripherally and an amyloid angiopathy pattern-main considerations amyloid beta related angiitis or inflammatory cerebral amyloid angiopathy/cerebral amyloid angiopathy (CAA) related inflammation CAA related inflammation (CAA-ri)  is top of the differential given the age but other more common diagnoses such as encephalitis would first be ruled out as CA related information is a diagnosis predominantly of exclusion.  Recommendations: -I would recommend obtaining a fluoroscopy guided LP. -We will follow the patient tomorrow after the LP results are available.  Need to rule out an infection prior to starting steroids which are the mainstay of treatment NCA related inflammation  -- Amie [place], MD [facility] Pager: [phone] If 7pm to 7am, please call on call as listed on AMION.

## 2019-11-19 NOTE — TOC Progression Note (Signed)
Transition of Care [facility]) - Progression Note    Patient Details  Name: [name] MRN: [id number] Date of Birth: Apr 18, 1930  [facility] CM/SW Contact  Ross Ludwig, [place] Phone Number: 11/19/2019, 5:02 PM  Clinical Narrative:    PT recommending SNF, per PT patient agreeable, CSW to work patient up for SNF placement once decision is confirmed that patient wants to go to SNF.        Expected Discharge Plan and Services                                                 Social Determinants of Health (SDOH) Interventions    Readmission Risk Interventions No flowsheet data found.

## 2019-11-19 NOTE — H&P (Signed)
History and Physical    [name] ZOX:[id number] DOB: 1930/07/02 DOA: 11/18/2019  PCP: Kerin Perna, NP  Patient coming from: Home.  History obtained from patient's daughter.  Chief Complaint: Confusion and inability to walk.  HPI: [name] is a 86 y.o. female with history of hypertension chronic lower extremity edema was brought to the ER after patient's daughter found the patient was confused yesterday when patient's daughter visited the patient at around 37 AM.  Patient was sitting confused with not wearing anything waist below.  Patient was repeating certain words which was not making any sense.  Follow difficult to walk with some ataxic gait.  Per patient's daughter patient was normal the previous night.  Patient did not have any nausea vomiting diarrhea chest pain shortness of breath headache or fever or chills.  No new medications.  ED Course: In the ER on exam patient appears to have difficulty moving her lower extremities and was initially mildly confused and repeating words.  Patient's temperature was around 99 F UA was unremarkable CT angiogram of the chest was negative for anything acute.  EKG was showing sinus tachycardia.  Blood cultures and urine cultures were obtained concerning for sepsis initiated lactic acid was elevated.  CBC was unremarkable creatinine 1.3 BNP 296 D-dimer was 2.05 AST 66.  Hemoglobin of 1.4.  Covid test pending.  MRI of the brain done shows abnormality in the right occipital and temporal area concerning for sequelae of old stroke or trauma or could be metastatic disease and requested MRI brain with contrast.  Neurology was consulted and patient admitted for further management.  Review of Systems: As per HPI, rest all negative.   Past Medical History:  Diagnosis Date  . CAP (community acquired pneumonia) 09/21/2015  . Chronic kidney disease, stage III (moderate)   . Encounter for long-term (current) use of other medications   . Glaucoma  (increased eye pressure)   . Head pain   . Hypertension   . Impaired glucose tolerance test   . Insomnia, unspecified   . Morbid obesity ([place])   . Neck pain   . Osteoarthrosis, unspecified whether generalized or localized, other specified sites   . Other lymphedema   . Pain in joint, multiple sites   . Primary open-angle glaucoma(365.11)     Past Surgical History:  Procedure Laterality Date  . ABDOMINAL HYSTERECTOMY    . EYE SURGERY       reports that she has never smoked. She has never used smokeless tobacco. She reports that she does not drink alcohol or use drugs.  Allergies  Allergen Reactions  . Penicillins Hives    Has patient had a PCN reaction causing immediate rash, facial/tongue/throat swelling, SOB or lightheadedness with hypotension: No Has patient had a PCN reaction causing severe rash involving mucus membranes or skin necrosis: No Has patient had a PCN reaction that required hospitalization No Has patient had a PCN reaction occurring within the last 10 years: No If all of the above answers are "NO", then may proceed with Cephalosporin use.  . Tetanus Toxoid Hives    Family History  Problem Relation Age of Onset  . Cancer Father     Prior to Admission medications   Medication Sig Start Date End Date Taking? Authorizing Provider  acetaminophen (TYLENOL) 500 MG tablet Take 500 mg by mouth at bedtime.   Yes [provider]  alendronate (FOSAMAX) 35 MG tablet Take 1 tablet (35 mg total) by mouth every 7 (  seven) days. Take with a full glass of water on an empty stomach. 03/24/19  Yes Edwards, Milford Cage, NP  ALOE VERA JUICE PO Take 6 oz by mouth daily.   Yes [provider]  aspirin EC 81 MG tablet Take 1 tablet (81 mg total) by mouth daily. 03/26/17  Yes Clent Demark, PA-C  [facility] VITAMIN C 500 MG tablet Take 500 mg by mouth daily. 06/24/18  Yes [provider]  Cyanocobalamin (VITAMIN B-12 PO) Take 1 tablet by mouth daily.   Yes  [provider]  docusate sodium (COLACE) 50 MG capsule Take 1 capsule (50 mg total) by mouth 2 (two) times daily. 03/26/17  Yes Clent Demark, PA-C  gabapentin (NEURONTIN) 100 MG capsule Take 1 tablets in AM and 1 tablet at lunch and continue 2 tablets at bedtime Patient taking differently: Take 100-200 mg by mouth See admin instructions. Take 1 tablets in AM and 1 tablet at lunch and continue 2 tablets at bedtime 08/11/19  Yes Edwards, Sharyn Lull P, NP  GARLIC PO Take 1,610 mg by mouth daily.    Yes [provider]  losartan (COZAAR) 50 MG tablet TAKE 1 TABLET BY MOUTH EVERY DAY Patient taking differently: Take 50 mg by mouth daily.  09/29/19  Yes Kerin Perna, NP  Multiple Vitamin (MULTIVITAMIN WITH MINERALS) TABS Take 1 tablet by mouth daily.   Yes [provider]  Polyethyl Glycol-Propyl Glycol 0.4-0.3 % SOLN Place 1 drop into both eyes 3 (three) times daily as needed (for lubricating eyes).   Yes [provider]  spironolactone (ALDACTONE) 25 MG tablet Take 25 mg by mouth daily. 10/21/19  Yes [provider]  tetrahydrozoline 0.05 % ophthalmic solution Place 1 drop into both eyes daily as needed (eye irritation).   Yes [provider]  trolamine salicylate (ASPERCREME) 10 % cream Apply 1 application topically as needed for muscle pain.   Yes [provider]  amLODipine (NORVASC) 10 MG tablet Take 1 tablet (10 mg total) by mouth daily. Patient not taking: Reported on 11/18/2019 03/24/19   Kerin Perna, NP  furosemide (LASIX) 20 MG tablet Take 1 tablet (20 mg total) by mouth daily. Patient not taking: Reported on 11/18/2019 03/24/19   Kerin Perna, NP  loratadine (CLARITIN) 10 MG tablet Take 1 tablet (10 mg total) by mouth daily. Patient not taking: Reported on 11/18/2019 03/24/19   Kerin Perna, NP    Physical Exam: Constitutional: Moderately built and nourished. Vitals:   11/19/19 0030 11/19/19 0049  11/19/19 0100 11/19/19 0200  BP: (!) 159/76 (!) 159/76 (!) 150/80 (!) 168/81  Pulse: 92 87 97 93  Resp: (!) 32 19 (!) 29 20  Temp:    98.7 F (37.1 C)  TempSrc:    Oral  SpO2: 97% 97% 98% 98%  Weight:    70.7 kg  Height:    4\' 10"  (1.473 m)   Eyes: Anicteric no pallor. ENMT: No discharge from the ears eyes nose or mouth. Neck: No neck rigidity no mass felt. Respiratory: No rhonchi or crepitations. Cardiovascular: S1-S2 heard. Abdomen: Soft nontender bowel sounds present. Musculoskeletal: No edema. Skin: No rash. Neurologic: Alert awake oriented to name and place.  Difficult to move her both lower extremities but has good sensation.  Upper extremities are 5 x 5. Psychiatric: Oriented to name and place.   Labs on Admission: I have personally reviewed following labs and imaging studies  CBC: Recent Labs  Lab 11/18/19 1405  WBC 6.9  NEUTROABS 5.7  HGB 11.4*  HCT 37.7  MCV 85.3  PLT 440   Basic Metabolic Panel: Recent Labs  Lab 11/18/19 1405  NA 143  K 4.5  CL 107  CO2 23  GLUCOSE 168*  BUN 21  CREATININE 1.32*  CALCIUM 9.3   GFR: Estimated Creatinine Clearance: 25 mL/min (A) (by C-G formula based on SCr of 1.32 mg/dL (H)). Liver Function Tests: Recent Labs  Lab 11/18/19 1405  AST 66*  ALT 28  ALKPHOS 85  BILITOT 0.9  PROT 8.1  ALBUMIN 3.8   No results for input(s): LIPASE, AMYLASE in the last 168 hours. No results for input(s): AMMONIA in the last 168 hours. Coagulation Profile: Recent Labs  Lab 11/18/19 1603  INR 1.1   Cardiac Enzymes: No results for input(s): CKTOTAL, CKMB, CKMBINDEX, TROPONINI in the last 168 hours. BNP (last 3 results) No results for input(s): PROBNP in the last 8760 hours. HbA1C: No results for input(s): HGBA1C in the last 72 hours. CBG: No results for input(s): GLUCAP in the last 168 hours. Lipid Profile: No results for input(s): CHOL, HDL, LDLCALC, TRIG, CHOLHDL, LDLDIRECT in the last 72 hours. Thyroid Function  Tests: No results for input(s): TSH, T4TOTAL, FREET4, T3FREE, THYROIDAB in the last 72 hours. Anemia Panel: No results for input(s): VITAMINB12, FOLATE, FERRITIN, TIBC, IRON, RETICCTPCT in the last 72 hours. Urine analysis:    Component Value Date/Time   COLORURINE YELLOW 11/18/2019 1431   APPEARANCEUR CLEAR 11/18/2019 1431   LABSPEC 1.024 11/18/2019 1431   PHURINE 5.0 11/18/2019 1431   GLUCOSEU NEGATIVE 11/18/2019 1431   HGBUR NEGATIVE 11/18/2019 1431   BILIRUBINUR NEGATIVE 11/18/2019 1431   KETONESUR 5 (A) 11/18/2019 1431   PROTEINUR 100 (A) 11/18/2019 1431   NITRITE NEGATIVE 11/18/2019 1431   LEUKOCYTESUR NEGATIVE 11/18/2019 1431   Sepsis Labs: @LABRCNTIP (procalcitonin:4,lacticidven:4) )No results found for this or any previous visit (from the past 240 hour(s)).   Radiological Exams on Admission: CT Angio Chest PE W and/or Wo Contrast  Result Date: 11/19/2019 CLINICAL DATA:  Extremity edema, elevated D-dimer EXAM: CT ANGIOGRAPHY CHEST WITH CONTRAST TECHNIQUE: Multidetector CT imaging of the chest was performed using the standard protocol during bolus administration of intravenous contrast. Multiplanar CT image reconstructions and MIPs were obtained to evaluate the vascular anatomy. CONTRAST:  127mL OMNIPAQUE IOHEXOL 350 MG/ML SOLN COMPARISON:  09/23/2015 FINDINGS: Cardiovascular: No filling defects in the pulmonary arteries to suggest pulmonary emboli. Heart is enlarged. Scattered aortic calcifications. No evidence of aortic aneurysm. Prominent central pulmonary arteries. Main pulmonary artery measures 38 mm. Findings compatible with pulmonary arterial hypertension. Mediastinum/Nodes: No mediastinal, hilar, or axillary adenopathy. Trachea and esophagus are unremarkable. Thyroid unremarkable. Lungs/Pleura: Mild elevation of the right hemidiaphragm. No confluent opacities, effusions or edema. Upper Abdomen: Imaging into the upper abdomen shows no acute findings. Musculoskeletal: No acute  bony abnormality. Chest wall soft tissues are unremarkable. Review of the MIP images confirms the above findings. IMPRESSION: Cardiomegaly.  Pulmonary arterial hypertension. No evidence of pulmonary embolus. Aortic Atherosclerosis (ICD10-I70.0). Electronically Signed   By: Rolm Baptise M.D.   On: 11/19/2019 03:57   MR BRAIN WO CONTRAST  Result Date: 11/18/2019 CLINICAL DATA:  Encephalopathy EXAM: MRI HEAD WITHOUT CONTRAST TECHNIQUE: Multiplanar, multiecho pulse sequences of the brain and surrounding structures were obtained without intravenous contrast. COMPARISON:  None. FINDINGS: Brain: There are new areas of hyperintense T2-weighted signal within the right occipital and anterior right temporal white matter. There is no abnormal diffusion restriction. Normal white matter  signal. Normal volume of CSF spaces. numerous chronic microhemorrhages in a predominantly peripheral distribution. Normal midline structures. Vascular: Normal flow voids. Skull and upper cervical spine: Normal marrow signal. Sinuses/Orbits: Negative. Other: None. IMPRESSION: 1. No acute ischemia or mass effect. 2. New areas of hyperintense T2-weighted signal within the right occipital and anterior right temporal white matter, which may be sequela of prior ischemia or trauma. However, this could also indicate vasogenic edema from metastatic disease. Further imaging with intravenous contrast is recommended to assess for the possibility of metastases. 3. Numerous chronic microhemorrhages in a predominantly peripheral distribution most consistent with cerebral amyloid angiopathy. Electronically Signed   By: Ulyses Jarred M.D.   On: 11/18/2019 20:14   DG Chest Portable 1 View  Result Date: 11/18/2019 CLINICAL DATA:  Fever. EXAM: PORTABLE CHEST 1 VIEW COMPARISON:  PA and lateral chest 11/06/2016 and 05/31/2016. FINDINGS: The lungs are clear. Heart size is upper normal. Prominence of the pulmonary outflow tract compatible with pulmonary arterial  hypertension noted. Atherosclerotic vascular disease is seen. No pneumothorax or pleural effusion. No acute or focal bony abnormality. IMPRESSION: No acute disease. Findings suggestive of pulmonary arterial hypertension. Atherosclerosis. Electronically Signed   By: Inge Rise M.D.   On: 11/18/2019 14:42    EKG: Independently reviewed.  Sinus tachycardia.  Assessment/Plan Principal Problem:   Acute encephalopathy Active Problems:   Hypertension    1. Acute encephalopathy with abnormal MRI brain -discussed with neurologist.  At this time plan is to get MRI brain with contrast to have further plans.  There is concern for metastatic disease.  Other differentials include-ischemic cause.  Per patient's daughter patient did not have any recent trauma she did hit her head about 6 years ago.  Nothing recently.  Will closely monitor.  Patient had some difficulty swallowing get speech therapist evaluation.  In addition we will check ammonia TSH B12. 2. Hypertension we will keep patient on as needed IV hydralazine until patient passes swallow. 3. Electronic bolus per edema on diuretics which will be on hold for now. 4. Chronic kidney disease stage III creatinine appears to be at baseline.  We will gently hydrate and see if GFR improves before MRI brain with contrast.  If not we will discuss with neurologist about the MRI with contrast. 5. Anemia appears to be chronic.  Follow CBC.  Will check anemia panel.  Covid test is pending.   DVT prophylaxis: Lovenox. Code Status: Full code. Family Communication: Discussed with patient's daughter. Disposition Plan: Home. Consults called: Neurology. Admission status: Observation.   Rise Patience MD [facility] Pager [phone].  If 7PM-7AM, please contact night-coverage [email or web address] Password [facility]  11/19/2019, 4:12 AM

## 2019-11-20 DIAGNOSIS — E854 Organ-limited amyloidosis: Principal | ICD-10-CM

## 2019-11-20 LAB — CBC WITH DIFFERENTIAL/PLATELET
Abs Immature Granulocytes: 0.01 10*3/uL (ref 0.00–0.07)
Basophils Absolute: 0 10*3/uL (ref 0.0–0.1)
Basophils Relative: 0 %
Eosinophils Absolute: 0.1 10*3/uL (ref 0.0–0.5)
Eosinophils Relative: 1 %
HCT: 34 % — ABNORMAL LOW (ref 36.0–46.0)
Hemoglobin: 10.1 g/dL — ABNORMAL LOW (ref 12.0–15.0)
Immature Granulocytes: 0 %
Lymphocytes Relative: 16 %
Lymphs Abs: 0.9 10*3/uL (ref 0.7–4.0)
MCH: 25.8 pg — ABNORMAL LOW (ref 26.0–34.0)
MCHC: 29.7 g/dL — ABNORMAL LOW (ref 30.0–36.0)
MCV: 86.7 fL (ref 80.0–100.0)
Monocytes Absolute: 0.5 10*3/uL (ref 0.1–1.0)
Monocytes Relative: 9 %
Neutro Abs: 4 10*3/uL (ref 1.7–7.7)
Neutrophils Relative %: 74 %
Platelets: 176 10*3/uL (ref 150–400)
RBC: 3.92 MIL/uL (ref 3.87–5.11)
RDW: 16 % — ABNORMAL HIGH (ref 11.5–15.5)
WBC: 5.4 10*3/uL (ref 4.0–10.5)
nRBC: 0 % (ref 0.0–0.2)

## 2019-11-20 LAB — GLUCOSE, CAPILLARY
Glucose-Capillary: 104 mg/dL — ABNORMAL HIGH (ref 70–99)
Glucose-Capillary: 112 mg/dL — ABNORMAL HIGH (ref 70–99)
Glucose-Capillary: 146 mg/dL — ABNORMAL HIGH (ref 70–99)

## 2019-11-20 LAB — COMPREHENSIVE METABOLIC PANEL
ALT: 20 U/L (ref 0–44)
AST: 37 U/L (ref 15–41)
Albumin: 2.5 g/dL — ABNORMAL LOW (ref 3.5–5.0)
Alkaline Phosphatase: 57 U/L (ref 38–126)
Anion gap: 10 (ref 5–15)
BUN: 17 mg/dL (ref 8–23)
CO2: 20 mmol/L — ABNORMAL LOW (ref 22–32)
Calcium: 8.1 mg/dL — ABNORMAL LOW (ref 8.9–10.3)
Chloride: 112 mmol/L — ABNORMAL HIGH (ref 98–111)
Creatinine, Ser: 0.96 mg/dL (ref 0.44–1.00)
GFR calc Af Amer: >60 mL/min (ref >60)
GFR calc non Af Amer: 53 mL/min — ABNORMAL LOW (ref >60)
Glucose, Bld: 112 mg/dL — ABNORMAL HIGH (ref 70–99)
Potassium: 3.7 mmol/L (ref 3.5–5.1)
Sodium: 142 mmol/L (ref 135–145)
Total Bilirubin: 1 mg/dL (ref 0.3–1.2)
Total Protein: 5.7 g/dL — ABNORMAL LOW (ref 6.5–8.1)

## 2019-11-20 LAB — CK: Total CK: 690 U/L — ABNORMAL HIGH (ref 38–234)

## 2019-11-20 LAB — URINE CULTURE: Culture: 5000 — AB

## 2019-11-20 LAB — PHOSPHORUS: Phosphorus: 2.4 mg/dL — ABNORMAL LOW (ref 2.5–4.6)

## 2019-11-20 LAB — MAGNESIUM: Magnesium: 2 mg/dL (ref 1.7–2.4)

## 2019-11-20 MED ORDER — ASPIRIN EC 81 MG PO TBEC
81.0000 mg | DELAYED_RELEASE_TABLET | Freq: Every day | ORAL | Status: DC
  Administered 2019-11-20 – 2019-11-29 (×10): 81 mg via ORAL
  Filled 2019-11-20 (×10): qty 1

## 2019-11-20 MED ORDER — DOCUSATE SODIUM 50 MG PO CAPS
50.0000 mg | ORAL_CAPSULE | Freq: Two times a day (BID) | ORAL | Status: DC
  Administered 2019-11-20 – 2019-11-29 (×17): 50 mg via ORAL
  Filled 2019-11-20 (×19): qty 1

## 2019-11-20 MED ORDER — K PHOS MONO-SOD PHOS DI & MONO 155-852-130 MG PO TABS
500.0000 mg | ORAL_TABLET | Freq: Once | ORAL | Status: AC
  Administered 2019-11-20: 500 mg via ORAL
  Filled 2019-11-20: qty 2

## 2019-11-20 MED ORDER — ADULT MULTIVITAMIN W/MINERALS CH
1.0000 | ORAL_TABLET | Freq: Every day | ORAL | Status: DC
  Administered 2019-11-21 – 2019-11-29 (×9): 1 via ORAL
  Filled 2019-11-20 (×9): qty 1

## 2019-11-20 MED ORDER — GABAPENTIN 100 MG PO CAPS
100.0000 mg | ORAL_CAPSULE | Freq: Two times a day (BID) | ORAL | Status: DC
  Administered 2019-11-21 – 2019-11-29 (×18): 100 mg via ORAL
  Filled 2019-11-20 (×17): qty 1

## 2019-11-20 MED ORDER — POLYVINYL ALCOHOL 1.4 % OP SOLN
1.0000 [drp] | Freq: Three times a day (TID) | OPHTHALMIC | Status: DC | PRN
  Filled 2019-11-20: qty 15

## 2019-11-20 MED ORDER — GABAPENTIN 100 MG PO CAPS
200.0000 mg | ORAL_CAPSULE | Freq: Every day | ORAL | Status: DC
  Administered 2019-11-20 – 2019-11-28 (×8): 200 mg via ORAL
  Filled 2019-11-20 (×10): qty 2

## 2019-11-20 MED ORDER — LOSARTAN POTASSIUM 50 MG PO TABS
50.0000 mg | ORAL_TABLET | Freq: Every day | ORAL | Status: DC
  Administered 2019-11-20 – 2019-11-21 (×2): 50 mg via ORAL
  Filled 2019-11-20 (×2): qty 1

## 2019-11-20 NOTE — Progress Notes (Signed)
PROGRESS NOTE    [name]  HRC:[id number] DOB: 14-Mar-1930 DOA: 11/18/2019 PCP: Kerin Perna, NP   Brief Narrative:  The patient is an 86 year old African-American female with past medical history significant for but not limited to history of CKD, morbid obesity, hypertension, bilateral lower extremity leg edema as well as osteoarthritis who presented to [facility] via EMS for evaluation of AMS which has been going on for last 2 days.  Family was worried about a UTI as UTIs caused her AMS in the past.  She is noted to have bilateral lower extremity edema in the ED and her lactate was elevated.  Her urinalysis however was negative as well as her chest x-ray.  A CT of the head was obtained revealing a new areas of hyperintense T2 weighted signal within the right occipital and right anterior temporal white matter.  Imaging was recommended with IV contrast with MRI and this was done.  Neurology was consulted for further evaluation recommendations and they initially recommended repeating the MRI of the brain with contrast.  I spoke with Dr. Amie Portland who is going to be discussing the case with some of his colleagues.  Repeat MRI showed  "Findings consistent with either amyloid beta related angiitis or inflammatory cerebral amyloid angiopathy. No evidence of metastatic disease. I doubt the diagnoses of atypical PRES, prior ischemic infarction, prior trauma or PML, which were considered." Her lactic acidosis has improved and she is much more awake and alert today and she was reportedly.  SLP came to evaluate the patient and they are recommending a regular diet with thin liquids.  Patient's renal function is improving however her CK was elevated on admission.  We will continue IV fluid hydration with normal saline at 75 mL's per hour for next 12 hours and obtain a PT and OT consultation.  **Interim History IV fluids have now stopped and there is concern for cerebral amyloid angiopathy related  inflammation based on her imaging.  Neurology wants to rule out other sources and causes such as viral infection or autoimmune diseases and have recommended an LP but patient and family is undecided and likely did not want to pursue it however will let us know tomorrow.  Patient was evaluated by PT and OT and they are recommending SNF however patient and family likely will go home.  If she does not pursue further testing as an inpatient and refuses SNF she will be discharged in the next 24 to 48 hours back home as she is improved from the time that she came in.  Assessment & Plan:   Principal Problem:   Acute encephalopathy Active Problems:   Hypertension  Acute Encephalopathy with abnormal MRI brain -Dr. Hal Hope discussed with neurologist and they formally consulted -At this time plan is to get MRI brain with contrast to have further plans done as below -There was concern for metastatic disease.  -Other differentials include-ischemic cause.  -Per patient's daughter patient did not have any recent trauma she did hit her head about 6 years ago. Nothing recently. Will closely monitor.  -Patient had some difficulty swallowing get speech therapist evaluation evaluated and recommending a regular diet with thin liquids.  -In addition we will check ammonia TSH B12; ammonia was elevated at 50 and was within normal limits at 1.775 -Patient was found to have an elevated CK and will be continued on IV fluid hydration gently with normal saline at 75 MLS per hour this is now stopped; CK is now trending down  from 1074 now 35 -MRI of the brain with contrast showed "Findings consistent with either amyloid beta related angiitis or inflammatory cerebral amyloid angiopathy. No evidence of metastatic disease. I doubt the diagnoses of atypical  PRES, prior ischemic infarction, prior trauma or PML, which were considered." -Dr. Rory Percy evaluated and reviewed her imaging and felt that there is no enhancement  seen.   "Vasogenic edema in the occipital cortex and temporal cortex unilaterally with susceptibility weighted images showing multiple foci of hemosiderin deposit peripherally and an amyloid angiopathy pattern.";  He feels that the main considerations are for amyloid beta related angiitis or inflammatory cerebral amyloid angiopathy/cerebral amyloid angiopathy related inflammation is at the top of the differential -He recommended a fluoroscopy guided LP however patient has refused; Dr. Rory Percy was wanting valuate and rule out infection prior to starting steroids -Patient's daughter and the patient both refused LP today and the neurology team spoke with the family and the patient and they explained the differential diagnosis and importance of doing spinal tap to ensure that they are ruling out infection and finding more information about any inflammatory process and family will discuss it with them no cells further make a final decision -Neurology recommends that if they do agree to a spinal tap that they will be following up after this but they do not agree to hospitalization follow-up with outpatient neurology for further management -Dr. Rory Percy does not have a high suspicion for bacterial infection but feel that a viral process or autoimmune inflammatory process could be presenting like this but he feels that imaging more feels like cerebral amyloid angiopathy related inflammation -PT OT evaluated and recommending SNF however patient and daughter are hesitant about going to SNF currently -We will follow up with the patient and with a decide about whether they want a lumbar puncture and where they want to go for discharge disposition -If patient does not want a lumbar puncture patient does not want to go to SNF she will likely be discharged in the next 24 to 48 hours her mentation is improved  Hypertension  -She is on IV hydralazine as needed now every 4 hours for systolic blood pressure greater than 284 or  diastolic blood pressure greater than 110 -We will be resuming her home losartan 50 mg p.o. daily  Lower Extremity Edema  -On diuretics as an outpatient but we are currently holding these and will not stop her IV fluids as well as-continue to monitor carefully  Chronic Kidney Disease Stage III Metabolic acidosis, mild -Creatinine appears to be at baseline and is improved -She does have a mild metabolic acidosis with a CO2 of 20, and anion gap of 10, and chloride level of 112 -Given Gentle IVF Hydration with NS but this has now been stopped -BUN/Cr is trending down and went from 21/1.32 on admission is now 17/0.96-avoid nephrotoxic medications, contrast dyes, hypotension and renally adjust medications -Continue to monitor and trend renal function -Repeat CMP in a.m.  Hypophosphatemia -Patient's phosphorus level was 2.4 -Replete with p.o. K-Phos Neutral 500 mg x 1 -Continue to monitor and replete as necessary -Repeat phosphorus level in the a.m.  Normocytic Anemia  -Appears to be chronic.  -Follow CBC.  -Will check anemia panel. -Continue to monitor for signs and symptoms of bleeding; currently no overt bleeding noted -Repeat CBC in a.m.  Obesity -Estimated body mass index is 32.58 kg/m as calculated from the following:   Height as of this encounter: 4\' 10"  (1.473 m).   Weight as of  this encounter: 70.7 kg. -Weight Loss and Dietary Counseling given   DVT prophylaxis: SCDs in case she decides for LP Code Status: FULL CODE  Family Communication: Discussed with Daughter at bedside  Disposition Plan: Patient is from home and PT OT evaluated and recommending skilled nursing facility however patient and family is hesitant about going to rehab facility currently and likely will go home with home health but have been undecided about doing further testing for possible cerebral amyloid angiopathy related inflammation and have refused LP currently.  They will decide and let us know  what they want to pursue the LP tomorrow but likely she can be discharged in the next 24 to 48 hours if she does not proceed with LP testing  Status is: Inpatient  Remains inpatient appropriate because:Ongoing diagnostic testing needed not appropriate for outpatient work up and Unsafe d/c plan   Dispo: The patient is from: Home              Anticipated d/c is to: Home              Anticipated d/c date is: 1 day              Patient currently is not medically stable to d/c.  Consultants:   Neurology   Procedures:  MRI's  Antimicrobials: Anti-infectives (From admission, onward)   Start     Dose/Rate Route Frequency Ordered Stop   11/18/19 1530  cefTRIAXone (ROCEPHIN) 1 g in sodium chloride 0.9 % 100 mL IVPB  Status:  Discontinued     1 g 200 mL/hr over 30 Minutes Intravenous Every 24 hours 11/18/19 1526 11/19/19 0429     Subjective: Seen and examined at bedside and states that she is doing "okay".  No nausea or vomiting.  Denies any pain.  Did not want to pursue a LP today but after speaking with neurology they will think about it.  Patient is open to going to SNF however she is leaning towards going home with home health rather.  Denies any chest pain.  No other concerns or complaints at this time.  Objective: Vitals:   11/19/19 1315 11/19/19 2003 11/20/19 0531 11/20/19 1428  BP: 132/70 (!) 154/91 (!) 149/79 136/68  Pulse: 81 96 96 94  Resp:  20 18 (!) 22  Temp: (!) 97.3 F (36.3 C) 98.3 F (36.8 C) 98.2 F (36.8 C) 98.3 F (36.8 C)  TempSrc: Oral  Oral Oral  SpO2: 100% 98% 100% 100%  Weight:      Height:        Intake/Output Summary (Last 24 hours) at 11/20/2019 1721 Last data filed at 11/20/2019 1428 Gross per 24 hour  Intake 667.5 ml  Output 200 ml  Net 467.5 ml   Filed Weights   11/19/19 0200  Weight: 70.7 kg   Examination: Physical Exam:  Constitutional: Patient is a well-nourished, well-developed African-American elderly female currently in NAD and  appears calm  Eyes: She is blind in her left eye ENMT: External Ears, Nose appear normal. Grossly normal hearing.  Neck: Appears normal, supple, no cervical masses, normal ROM, no appreciable thyromegaly; no JVD Respiratory: Diminished to auscultation bilaterally, no wheezing, rales, rhonchi or crackles. Normal respiratory effort and patient is not tachypenic. No accessory muscle use.  Unlabored breathing Cardiovascular: RRR, no murmurs / rubs / gallops. S1 and S2 auscultated.  Minimal extremity edema Abdomen: Soft, non-tender, distended secondary habitus. Bowel sounds positive.  GU: Deferred. Musculoskeletal: No clubbing / cyanosis of digits/nails. No  joint deformity upper and lower extremities.  Skin: No rashes, lesions, ulcers on limited skin evaluation. No induration; Warm and dry.  Neurologic: CN 2-12 grossly intact with no focal deficits. Romberg sign and cerebellar reflexes not assessed.  Psychiatric: Normal judgment and insight. Alert and oriented x 3. Normal mood and appropriate affect.   Data Reviewed: I have personally reviewed following labs and imaging studies  CBC: Recent Labs  Lab 11/18/19 1405 11/19/19 0442 11/20/19 0528  WBC 6.9 6.5 5.4  NEUTROABS 5.7 4.8 4.0  HGB 11.4* 10.8* 10.1*  HCT 37.7 35.3* 34.0*  MCV 85.3 84.2 86.7  PLT 218 177 676   Basic Metabolic Panel: Recent Labs  Lab 11/18/19 1405 11/19/19 0442 11/20/19 0528  NA 143 143 142  K 4.5 4.3 3.7  CL 107 110 112*  CO2 23 23 20*  GLUCOSE 168* 118* 112*  BUN 21 18 17   CREATININE 1.32* 1.06* 0.96  CALCIUM 9.3 8.5* 8.1*  MG  --   --  2.0  PHOS  --   --  2.4*   GFR: Estimated Creatinine Clearance: 34.4 mL/min (by C-G formula based on SCr of 0.96 mg/dL). Liver Function Tests: Recent Labs  Lab 11/18/19 1405 11/19/19 0442 11/20/19 0528  AST 66* 48* 37  ALT 28 22 20   ALKPHOS 85 69 57  BILITOT 0.9 1.3* 1.0  PROT 8.1 6.3* 5.7*  ALBUMIN 3.8 2.9* 2.5*   No results for input(s): LIPASE, AMYLASE in  the last 168 hours. Recent Labs  Lab 11/19/19 0530  AMMONIA 50*   Coagulation Profile: Recent Labs  Lab 11/18/19 1603  INR 1.1   Cardiac Enzymes: Recent Labs  Lab 11/19/19 0442 11/20/19 0528  CKTOTAL 1,074* 690*   BNP (last 3 results) No results for input(s): PROBNP in the last 8760 hours. HbA1C: No results for input(s): HGBA1C in the last 72 hours. CBG: Recent Labs  Lab 11/19/19 0727 11/19/19 1630 11/20/19 0005 11/20/19 0758 11/20/19 1658  GLUCAP 99 115* 112* 104* 146*   Lipid Profile: No results for input(s): CHOL, HDL, LDLCALC, TRIG, CHOLHDL, LDLDIRECT in the last 72 hours. Thyroid Function Tests: Recent Labs    11/19/19 0530  TSH 1.775   Anemia Panel: No results for input(s): VITAMINB12, FOLATE, FERRITIN, TIBC, IRON, RETICCTPCT in the last 72 hours. Sepsis Labs: Recent Labs  Lab 11/18/19 1405 11/18/19 1603 11/19/19 0530 11/19/19 0716  LATICACIDVEN 2.9* 2.9* 1.3 1.5    Recent Results (from the past 240 hour(s))  Blood culture (routine x 2)     Status: None (Preliminary result)   Collection Time: 11/18/19  1:48 PM   Specimen: BLOOD  Result Value Ref Range Status   Specimen Description   Final    BLOOD LEFT ANTECUBITAL Performed at [facility], [place] [address] [place], [place] [address]    Special Requests   Final    BOTTLES DRAWN AEROBIC AND ANAEROBIC Blood Culture results may not be optimal due to an excessive volume of blood received in culture bottles Performed at [facility] [address] [place], [place] [address]    Culture   Final    NO GROWTH 2 DAYS Performed at [facility] [address], [place], [place] [address]    Report Status PENDING  Incomplete  Blood culture (routine x 2)     Status: None (Preliminary result)   Collection Time: 11/18/19  2:05 PM   Specimen: BLOOD  Result Value Ref Range Status   Specimen Description  Final    BLOOD RIGHT ANTECUBITAL Performed at [facility] [address] [place], [place] [address]    Special Requests   Final    BOTTLES DRAWN AEROBIC ONLY Blood Culture results may not be optimal due to an inadequate volume of blood received in culture bottles Performed at [facility] [address], [place], [place] [address]    Culture   Final    NO GROWTH 2 DAYS Performed at [facility] [address], [place], [place] [address]    Report Status PENDING  Incomplete  Urine culture     Status: Abnormal   Collection Time: 11/18/19  2:31 PM   Specimen: In/Out Cath Urine  Result Value Ref Range Status   Specimen Description   Final    IN/OUT CATH URINE Performed at [facility] [address] [place], [place] [address]    Special Requests   Final    NONE Performed at [facility], [place] [address], [place], [place] [address]    Culture (A)  Final    5,000 COLONIES/mL GROUP B STREP(S.AGALACTIAE)ISOLATED TESTING AGAINST S. AGALACTIAE NOT ROUTINELY PERFORMED DUE TO PREDICTABILITY OF AMP/PEN/VAN SUSCEPTIBILITY. Performed at [facility], [place] [address], [place], [place] [address]    Report Status 11/20/2019 FINAL  Final  SARS CORONAVIRUS 2 (TAT 6-24 HRS) Nasopharyngeal Nasopharyngeal Swab     Status: None   Collection Time: 11/18/19 11:09 PM   Specimen: Nasopharyngeal Swab  Result Value Ref Range Status   SARS Coronavirus 2 NEGATIVE NEGATIVE Final    Comment: (NOTE) SARS-CoV-2 target nucleic acids are NOT DETECTED. The SARS-CoV-2 RNA is generally detectable in upper and lower respiratory specimens during the acute phase of infection. Negative results do not preclude SARS-CoV-2 infection, do not rule out co-infections with other pathogens, and should not be used as the sole basis for treatment or other patient management decisions. Negative results must be combined with clinical observations, patient history, and epidemiological  information. The expected result is Negative. Fact Sheet for Patients: SugarRoll.be Fact Sheet for Healthcare Providers: https://www.woods-mathews.com/ This test is not yet approved or cleared by the [place] FDA and  has been authorized for detection and/or diagnosis of SARS-CoV-2 by FDA under an Emergency Use Authorization (EUA). This EUA will remain  in effect (meaning this test can be used) for the duration of the COVID-19 declaration under Section 56 4(b)(1) of the Act, 21 U.S.C. section 360bbb-3(b)(1), unless the authorization is terminated or revoked sooner. Performed at [facility], [place] [address], [place],  [address]      RN Pressure Injury Documentation:     Estimated body mass index is 32.58 kg/m as calculated from the following:   Height as of this encounter: 4\' 10"  (1.473 m).   Weight as of this encounter: 70.7 kg.  Malnutrition Type:      Malnutrition Characteristics:      Nutrition Interventions:      Radiology Studies: CT Angio Chest PE W and/or Wo Contrast  Result Date: 11/19/2019 CLINICAL DATA:  Extremity edema, elevated D-dimer EXAM: CT ANGIOGRAPHY CHEST WITH CONTRAST TECHNIQUE: Multidetector CT imaging of the chest was performed using the standard protocol during bolus administration of intravenous contrast. Multiplanar CT image reconstructions and MIPs were obtained to evaluate the vascular anatomy. CONTRAST:  113mL OMNIPAQUE IOHEXOL 350 MG/ML SOLN COMPARISON:  09/23/2015 FINDINGS: Cardiovascular: No filling defects in the pulmonary arteries to suggest pulmonary emboli. Heart  is enlarged. Scattered aortic calcifications. No evidence of aortic aneurysm. Prominent central pulmonary arteries. Main pulmonary artery measures 38 mm. Findings compatible with pulmonary arterial hypertension. Mediastinum/Nodes: No mediastinal, hilar, or axillary adenopathy. Trachea and esophagus are unremarkable. Thyroid  unremarkable. Lungs/Pleura: Mild elevation of the right hemidiaphragm. No confluent opacities, effusions or edema. Upper Abdomen: Imaging into the upper abdomen shows no acute findings. Musculoskeletal: No acute bony abnormality. Chest wall soft tissues are unremarkable. Review of the MIP images confirms the above findings. IMPRESSION: Cardiomegaly.  Pulmonary arterial hypertension. No evidence of pulmonary embolus. Aortic Atherosclerosis (ICD10-I70.0). Electronically Signed   By: Rolm Baptise M.D.   On: 11/19/2019 03:57   MR BRAIN WO CONTRAST  Result Date: 11/18/2019 CLINICAL DATA:  Encephalopathy EXAM: MRI HEAD WITHOUT CONTRAST TECHNIQUE: Multiplanar, multiecho pulse sequences of the brain and surrounding structures were obtained without intravenous contrast. COMPARISON:  None. FINDINGS: Brain: There are new areas of hyperintense T2-weighted signal within the right occipital and anterior right temporal white matter. There is no abnormal diffusion restriction. Normal white matter signal. Normal volume of CSF spaces. numerous chronic microhemorrhages in a predominantly peripheral distribution. Normal midline structures. Vascular: Normal flow voids. Skull and upper cervical spine: Normal marrow signal. Sinuses/Orbits: Negative. Other: None. IMPRESSION: 1. No acute ischemia or mass effect. 2. New areas of hyperintense T2-weighted signal within the right occipital and anterior right temporal white matter, which may be sequela of prior ischemia or trauma. However, this could also indicate vasogenic edema from metastatic disease. Further imaging with intravenous contrast is recommended to assess for the possibility of metastases. 3. Numerous chronic microhemorrhages in a predominantly peripheral distribution most consistent with cerebral amyloid angiopathy. Electronically Signed   By: Ulyses Jarred M.D.   On: 11/18/2019 20:14   MR BRAIN W CONTRAST  Result Date: 11/19/2019 CLINICAL DATA:  Encephalopathy. Abnormal  noncontrast study yesterday, unable to rule out metastatic disease. EXAM: MRI HEAD WITH CONTRAST TECHNIQUE: Multiplanar, multiecho pulse sequences of the brain and surrounding structures were obtained with intravenous contrast. CONTRAST:  35mL GADAVIST GADOBUTROL 1 MMOL/ML IV SOLN COMPARISON:  11/18/2019 FINDINGS: Today's study does not show abnormal contrast enhancement of the brain or leptomeninges. Therefore there is no evidence of metastatic disease. In correlation with yesterday's study, I think that study showed a vasogenic edema pattern rather than cytotoxic edema pattern. We are seeing white matter edema with relative sparing of the overlying cortex. In this patient with multiple foci of hemosiderin deposition in the peripheral brain likely indicating amyloid angiopathy, the most likely diagnosis is either amyloid beta related angiitis or inflammatory cerebral amyloid angiopathy. IMPRESSION: Findings consistent with either amyloid beta related angiitis or inflammatory cerebral amyloid angiopathy. No evidence of metastatic disease. I doubt the diagnoses of atypical PRES, prior ischemic infarction, prior trauma or PML, which were considered. Electronically Signed   By: Nelson Chimes M.D.   On: 11/19/2019 10:14   Scheduled Meds: Continuous Infusions:   LOS: 1 day   Kerney Elbe, DO [facility] PAGER is on [place]  If 7PM-7AM, please contact night-coverage [email or web address]

## 2019-11-20 NOTE — TOC Initial Note (Signed)
Transition of Care [facility]) - Initial/Assessment Note    Patient Details  Name: [name] MRN: [id number] Date of Birth: 05/27/1930  Transition of Care [facility]) CM/SW Contact:    Dessa Phi, RN Phone Number: 11/20/2019, 1:55 PM  Clinical Narrative:   PT/OT recc SNF-patient/dtr(Felicia) agreed to SNF-fl2 done,faxed out-await bed offers.                Expected Discharge Plan: Skilled Nursing Facility Barriers to Discharge: Continued Medical Work up   Patient Goals and CMS Choice        Expected Discharge Plan and Services Expected Discharge Plan: [place]   Discharge Planning Services: CM Consult   Living arrangements for the past 2 months: Single Family Home                                      Prior Living Arrangements/Services Living arrangements for the past 2 months: Single Family Home Lives with:: Adult Children Patient language and need for interpreter reviewed:: Yes Do you feel safe going back to the place where you live?: Yes      Need for Family Participation in Patient Care: No (Comment) Care giver support system in place?: Yes (comment)   Criminal Activity/Legal Involvement Pertinent to Current Situation/Hospitalization: No - Comment as needed  Activities of Daily Living Home Assistive Devices/Equipment: Eyeglasses, Cane (specify quad or straight) ADL Screening (condition at time of admission) Patient's cognitive ability adequate to safely complete daily activities?: No(new today) Is the patient deaf or have difficulty hearing?: Yes Does the patient have difficulty seeing, even when wearing glasses/contacts?: Yes(no vision in left eye) Does the patient have difficulty concentrating, remembering, or making decisions?: Yes(new today) Patient able to express need for assistance with ADLs?: Yes Does the patient have difficulty dressing or bathing?: No Independently performs ADLs?: Yes (appropriate for developmental age) Does the  patient have difficulty walking or climbing stairs?: Yes Weakness of Legs: Both Weakness of Arms/Hands: Both  Permission Sought/Granted Permission sought to share information with : Case Manager Permission granted to share information with : Yes, Verbal Permission Granted  Share Information with NAME: Case Manager  Permission granted to share info w AGENCY: SNF  Permission granted to share info w Relationship: Freddi Che [phone]     Emotional Assessment Appearance:: Appears stated age Attitude/Demeanor/Rapport: Gracious Affect (typically observed): Accepting Orientation: : Oriented to Self, Oriented to Place, Oriented to  Time Alcohol / Substance Use: Not Applicable Psych Involvement: No (comment)  Admission diagnosis:  Acute encephalopathy [G93.40] Altered mental status, unspecified altered mental status type [R41.82] Patient Active Problem List   Diagnosis Date Noted  . PVD (peripheral vascular disease) ([place]) 12/19/2018  . Mitral valve insufficiency 03/19/2017  . Other spondylosis with radiculopathy, cervical region 03/14/2017  . Cerumen impaction 12/04/2016  . Generalized weakness 09/22/2015  . Acidosis 09/22/2015  . Acute encephalopathy 09/22/2015  . Hyperglycemia 09/22/2015  . Leukopenia 09/22/2015  . CAP (community acquired pneumonia) 09/22/2015  . Bilateral leg edema 09/22/2015  . Constipation 09/22/2015  . Snoring 09/13/2015  . Glaucoma (increased eye pressure)   . Hypertension   . Neck pain   . Head pain   . Pain in joint, multiple sites   . Impaired glucose tolerance test   . Osteoarthrosis, unspecified whether generalized or localized, other specified sites   . Morbid obesity ([place])   . Medication management   .  Other lymphedema   . Insomnia, unspecified   . Chronic kidney disease, stage III (moderate)   . Primary open-angle glaucoma(365.11)    PCP:  Kerin Perna, NP Pharmacy:   [id number] - [place], [place] [address] [place] [address] Phone: [phone] Fax: [phone]     Social Determinants of Health (SDOH) Interventions    Readmission Risk Interventions No flowsheet data found.

## 2019-11-20 NOTE — Progress Notes (Signed)
Patient stated that she is still deciding about having an LP or not. Informed patient to update the RN when she makes a decision

## 2019-11-20 NOTE — Plan of Care (Signed)
Spoke with the hospitalist.  Reports that patient and daughter refused LP. I spoke with the patient and daughter, Ms. Darlina Rumpf over the phone. I explained the differential diagnosis and the importance of doing a spinal tap to ensure we are ruling out an infection and also finding out more information about an inflammatory process. They need time to think and discuss amongst each other before they make a final decision. If they agree for spinal tap, to be performed under fluoroscopic guidance and I will follow up after. If they decide not to perform a spinal tap, they should follow with outpatient neurology for further management. I do not have high suspicion for a bacterial infection but a viral inflammatory process versus a autoimmune inflammatory process are top in the differentials.  Imaging more towards [facility].   -- Amie Portland, MD [facility] Pager: [phone] If 7pm to 7am, please call on call as listed on AMION.

## 2019-11-20 NOTE — NC FL2 (Signed)
[place] MEDICAID FL2 LEVEL OF CARE SCREENING TOOL     IDENTIFICATION  Patient Name: [name] Birthdate: 06/02/1930 Sex: female Admission Date (Current Location): 11/18/2019  [facility] and [place] Number:  Herbalist and Address:  [facility],  [place], [place]      Provider Number: [id number]  Attending Physician Name and Address:  Kerney Elbe, DO  Relative Name and Phone Number:  Freddi Che [phone]    Current Level of Care: Hospital Recommended Level of Care: [place] Prior Approval Number:    Date Approved/Denied:   PASRR Number: [id number]  Discharge Plan: SNF    Current Diagnoses: Patient Active Problem List   Diagnosis Date Noted  . PVD (peripheral vascular disease) ([place]) 12/19/2018  . Mitral valve insufficiency 03/19/2017  . Other spondylosis with radiculopathy, cervical region 03/14/2017  . Cerumen impaction 12/04/2016  . Generalized weakness 09/22/2015  . Acidosis 09/22/2015  . Acute encephalopathy 09/22/2015  . Hyperglycemia 09/22/2015  . Leukopenia 09/22/2015  . CAP (community acquired pneumonia) 09/22/2015  . Bilateral leg edema 09/22/2015  . Constipation 09/22/2015  . Snoring 09/13/2015  . Glaucoma (increased eye pressure)   . Hypertension   . Neck pain   . Head pain   . Pain in joint, multiple sites   . Impaired glucose tolerance test   . Osteoarthrosis, unspecified whether generalized or localized, other specified sites   . Morbid obesity ([place])   . Medication management   . Other lymphedema   . Insomnia, unspecified   . Chronic kidney disease, stage III (moderate)   . Primary open-angle glaucoma(365.11)     Orientation RESPIRATION BLADDER Height & Weight     Self, Time, Situation    Incontinent Weight: 70.7 kg Height:  4\' 10"  (147.3 cm)  BEHAVIORAL SYMPTOMS/MOOD NEUROLOGICAL BOWEL NUTRITION STATUS      Incontinent Diet(Regular thin-mild aspiration risk)   AMBULATORY STATUS COMMUNICATION OF NEEDS Skin   Limited Assist Verbally PU Stage and Appropriate Care(sacrum foam dsg.) PU Stage 1 Dressing: Daily                     Personal Care Assistance Level of Assistance  Bathing, Feeding, Dressing Bathing Assistance: Limited assistance Feeding assistance: Limited assistance Dressing Assistance: Limited assistance     Functional Limitations Info  Sight, Hearing, Speech Sight Info: Impaired(legally blind L eye;uses magnifying glass) Hearing Info: Impaired(R ear hearing aid-need a new one.HOH bilateral ears) Speech Info: Adequate    SPECIAL CARE FACTORS FREQUENCY  PT (By licensed PT), OT (By licensed OT)     PT Frequency: 5x week OT Frequency: 5x week            Contractures Contractures Info: Not present    Additional Factors Info  Code Status, Allergies Code Status Info: Full code Allergies Info: Penicillins;tetanus toxoid           Current Medications (11/20/2019):  This is the current hospital active medication list Current Facility-Administered Medications  Medication Dose Route Frequency Provider Last Rate Last Admin  . hydrALAZINE (APRESOLINE) injection 10 mg  10 mg Intravenous Q4H PRN Rise Patience, MD      . ondansetron [facility]) tablet 4 mg  4 mg Oral Q6H PRN Rise Patience, MD       Or  . ondansetron [facility]) injection 4 mg  4 mg Intravenous Q6H PRN Rise Patience, MD         Discharge Medications:  Please see discharge summary for a list of discharge medications.  Relevant Imaging Results:  Relevant Lab Results:   Additional Information ss#126 [place]  Cortez Flippen, Juliann Pulse, RN

## 2019-11-21 ENCOUNTER — Inpatient Hospital Stay (HOSPITAL_COMMUNITY): Payer: Medicare Other

## 2019-11-21 DIAGNOSIS — E876 Hypokalemia: Secondary | ICD-10-CM

## 2019-11-21 LAB — CBC WITH DIFFERENTIAL/PLATELET
Abs Immature Granulocytes: 0.02 10*3/uL (ref 0.00–0.07)
Basophils Absolute: 0 10*3/uL (ref 0.0–0.1)
Basophils Relative: 0 %
Eosinophils Absolute: 0.2 10*3/uL (ref 0.0–0.5)
Eosinophils Relative: 3 %
HCT: 32.8 % — ABNORMAL LOW (ref 36.0–46.0)
Hemoglobin: 10 g/dL — ABNORMAL LOW (ref 12.0–15.0)
Immature Granulocytes: 0 %
Lymphocytes Relative: 18 %
Lymphs Abs: 1.1 10*3/uL (ref 0.7–4.0)
MCH: 25.7 pg — ABNORMAL LOW (ref 26.0–34.0)
MCHC: 30.5 g/dL (ref 30.0–36.0)
MCV: 84.3 fL (ref 80.0–100.0)
Monocytes Absolute: 0.9 10*3/uL (ref 0.1–1.0)
Monocytes Relative: 14 %
Neutro Abs: 4.1 10*3/uL (ref 1.7–7.7)
Neutrophils Relative %: 65 %
Platelets: 182 10*3/uL (ref 150–400)
RBC: 3.89 MIL/uL (ref 3.87–5.11)
RDW: 15.7 % — ABNORMAL HIGH (ref 11.5–15.5)
WBC: 6.3 10*3/uL (ref 4.0–10.5)
nRBC: 0 % (ref 0.0–0.2)

## 2019-11-21 LAB — COMPREHENSIVE METABOLIC PANEL
ALT: 20 U/L (ref 0–44)
AST: 26 U/L (ref 15–41)
Albumin: 2.3 g/dL — ABNORMAL LOW (ref 3.5–5.0)
Alkaline Phosphatase: 54 U/L (ref 38–126)
Anion gap: 7 (ref 5–15)
BUN: 17 mg/dL (ref 8–23)
CO2: 25 mmol/L (ref 22–32)
Calcium: 8 mg/dL — ABNORMAL LOW (ref 8.9–10.3)
Chloride: 109 mmol/L (ref 98–111)
Creatinine, Ser: 0.95 mg/dL (ref 0.44–1.00)
GFR calc Af Amer: >60 mL/min (ref >60)
GFR calc non Af Amer: 54 mL/min — ABNORMAL LOW (ref >60)
Glucose, Bld: 115 mg/dL — ABNORMAL HIGH (ref 70–99)
Potassium: 3.4 mmol/L — ABNORMAL LOW (ref 3.5–5.1)
Sodium: 141 mmol/L (ref 135–145)
Total Bilirubin: 0.9 mg/dL (ref 0.3–1.2)
Total Protein: 5.4 g/dL — ABNORMAL LOW (ref 6.5–8.1)

## 2019-11-21 LAB — CSF CELL COUNT WITH DIFFERENTIAL
RBC Count, CSF: 49 /mm3 — ABNORMAL HIGH
Tube #: 4
WBC, CSF: 0 /mm3 (ref 0–5)

## 2019-11-21 LAB — PROTEIN, CSF: Total  Protein, CSF: 80 mg/dL — ABNORMAL HIGH (ref 15–45)

## 2019-11-21 LAB — GLUCOSE, CAPILLARY
Glucose-Capillary: 113 mg/dL — ABNORMAL HIGH (ref 70–99)
Glucose-Capillary: 113 mg/dL — ABNORMAL HIGH (ref 70–99)
Glucose-Capillary: 135 mg/dL — ABNORMAL HIGH (ref 70–99)
Glucose-Capillary: 85 mg/dL (ref 70–99)

## 2019-11-21 LAB — MAGNESIUM: Magnesium: 2 mg/dL (ref 1.7–2.4)

## 2019-11-21 LAB — PHOSPHORUS: Phosphorus: 2.6 mg/dL (ref 2.5–4.6)

## 2019-11-21 LAB — GLUCOSE, CSF: Glucose, CSF: 79 mg/dL — ABNORMAL HIGH (ref 40–70)

## 2019-11-21 MED ORDER — POTASSIUM CHLORIDE CRYS ER 20 MEQ PO TBCR
40.0000 meq | EXTENDED_RELEASE_TABLET | Freq: Two times a day (BID) | ORAL | Status: AC
  Administered 2019-11-21 – 2019-11-22 (×2): 40 meq via ORAL
  Filled 2019-11-21 (×2): qty 2

## 2019-11-21 MED ORDER — LIDOCAINE HCL 1 % IJ SOLN
INTRAMUSCULAR | Status: AC
  Filled 2019-11-21: qty 20

## 2019-11-21 MED ORDER — TRAMADOL HCL 50 MG PO TABS
50.0000 mg | ORAL_TABLET | Freq: Four times a day (QID) | ORAL | Status: DC | PRN
  Administered 2019-11-21 (×2): 50 mg via ORAL
  Filled 2019-11-21 (×2): qty 1

## 2019-11-21 NOTE — Progress Notes (Signed)
OT Cancellation Note  Patient Details Name: [name] MRN: [id number] DOB: 08-18-1929   Cancelled Treatment:    Reason Eval/Treat Not Completed: Patient at procedure or test/ unavailable(Patient being taken down for LP today. Patient will have to be supine after procedure. Unable to see today.)  Lenward Chancellor 11/21/2019, 2:24 PM

## 2019-11-21 NOTE — Progress Notes (Signed)
PROGRESS NOTE    [name]  VQQ:[id number] DOB: 1930/04/01 DOA: 11/18/2019 PCP: Kerin Perna, NP   Brief Narrative:  The patient is an 86 year old African-American female with past medical history significant for but not limited to history of CKD, morbid obesity, hypertension, bilateral lower extremity leg edema as well as osteoarthritis who presented to [facility] via EMS for evaluation of AMS which has been going on for last 2 days.  Family was worried about a UTI as UTIs caused her AMS in the past.  She is noted to have bilateral lower extremity edema in the ED and her lactate was elevated.  Her urinalysis however was negative as well as her chest x-ray.  A CT of the head was obtained revealing a new areas of hyperintense T2 weighted signal within the right occipital and right anterior temporal white matter.  Imaging was recommended with IV contrast with MRI and this was done.  Neurology was consulted for further evaluation recommendations and they initially recommended repeating the MRI of the brain with contrast.  I spoke with Dr. Amie [place] who is going to be discussing the case with some of his colleagues.  Repeat MRI showed  "Findings consistent with either amyloid beta related angiitis or inflammatory cerebral amyloid angiopathy. No evidence of metastatic disease. I doubt the diagnoses of atypical PRES, prior ischemic infarction, prior trauma or PML, which were considered." Her lactic acidosis has improved and she is much more awake and alert today and she was reportedly.  SLP came to evaluate the patient and they are recommending a regular diet with thin liquids.  Patient's renal function is improving however her CK was elevated on admission.  We will continue IV fluid hydration with normal saline at 75 mL's per hour for next 12 hours and obtain a PT and OT consultation.  **Interim History IV fluids have now stopped and there is concern for cerebral amyloid angiopathy related  inflammation based on her imaging.  Neurology wants to rule out other sources and causes such as viral infection or autoimmune diseases and have recommended an LP but patient and family was undecided and likely did not want to pursue it however have now decided they want to get it done. Patient was evaluated by PT and OT and they are recommending SNF however patient and family want to talk to Social Work about placement. Have called IR to arrange for LP to be Done Under [facility] hopefully today.   Assessment & Plan:   Principal Problem:   Acute encephalopathy Active Problems:   Hypertension  Acute Encephalopathy with abnormal MRI brain, improving  -Dr. Hal Hope discussed with neurologist and they formally consulted -At this time plan is to get MRI brain with contrast to have further plans done as below -There was concern for metastatic disease.  -Other differentials include-ischemic cause.  -Per patient's daughter patient did not have any recent trauma she did hit her head about 6 years ago. Nothing recently. Will closely monitor.  -Patient had some difficulty swallowing get speech therapist evaluation evaluated and recommending a regular diet with thin liquids.  -In addition we will check ammonia TSH B12; ammonia was elevated at 50 and was within normal limits at 1.775 -Patient was found to have an elevated CK and was continued on IV fluid hydration gently with normal saline at 75 MLS per hour this is now stopped; CK is now trending down from 1074 now 690 -MRI of the brain with contrast showed "Findings consistent with either  amyloid beta related angiitis or inflammatory cerebral amyloid angiopathy. No evidence of metastatic disease. I doubt the diagnoses of atypical  PRES, prior ischemic infarction, prior trauma or PML, which were considered." -Dr. Rory Percy evaluated and reviewed her imaging and felt that there is no enhancement seen. Per his note: "Vasogenic edema in the occipital cortex and  temporal cortex unilaterally with susceptibility weighted images showing multiple foci of hemosiderin deposit peripherally and an amyloid angiopathy pattern.";  He feels that the main considerations are for amyloid beta related angiitis or inflammatory cerebral amyloid angiopathy/cerebral amyloid angiopathy related inflammation is at the top of the differential -He recommended a fluoroscopy guided LP however patient has refused initially and now is agreeable; Dr. Rory Percy was wanting to further evaluate and rule out infection prior to starting steroids -Patient's daughter and the patient both refused LP yesterday and the neurology team spoke with the family and the patient and they explained the differential diagnosis and importance of doing spinal tap to ensure that they are ruling out infection and finding more information about any inflammatory process and family will discussed it further and will pursue a LP -Neurology recommends that if they do agree to a spinal tap that they will be following up after this but they do not agree to hospitalization follow-up with outpatient neurology for further management -Dr. Rory Percy does not have a high suspicion for bacterial infection but feel that a viral process or autoimmune inflammatory process could be presenting like this but he feels that imaging more feels like cerebral amyloid angiopathy related inflammation -PT OT evaluated and recommending SNF however patient and daughter are hesitant about going to SNF currently and wants to speak with Social Work -We will follow up with the patient and with a decide about whether they want a lumbar puncture and where they want to go for discharge disposition; Hopefully LP can be done today   Hypertension  -She is on IV hydralazine as needed now every 4 hours for systolic blood pressure greater than 301 or diastolic blood pressure greater than 110 -We will be resuming her home losartan 50 mg p.o. daily  Lower Extremity  Edema  -On diuretics as an outpatient but we are currently holding these and will now stop IVF as well -Continue to monitor carefully  Chronic Kidney Disease Stage III Metabolic acidosis, mild -Creatinine appears to be at baseline and is improved -She did have a mild metabolic acidosis with a CO2 of 20, and anion gap of 10, and chloride level of 112; now CO2 is 25, anion gap is 7, and chloride level is 109 -Given Gentle IVF Hydration with NS but this has now been stopped -BUN/Cr is trending down and went from 21/1.32 on admission is now 17/0.95 -Avoid nephrotoxic medications, contrast dyes, hypotension and renally adjust medications -Continue to monitor and trend renal function -Repeat CMP in a.m.  Hypophosphatemia -Patient's phosphorus level was 2.4 and improved to 2.6 -Replete with p.o. K-Phos Neutral 500 mg x 1 yesterday -Continue to monitor and replete as necessary -Repeat phosphorus level in the a.m.  Normocytic Anemia  -Appears to be chronic.  -Follow CBC.  -Hemoglobin/hematocrit went from 10.1/34.0 is now 10.0/32.8 -Will check anemia panel in the AM. -Continue to monitor for signs and symptoms of bleeding; currently no overt bleeding noted -Repeat CBC in a.m.  Hypokalemia -Patient is potassium today was 3.4  -replete with p.o. potassium chloride 40 mill colons twice daily x2 doses -Continue to monitor and replete as necessary -Repeat CMP in  the a.m.  Leg pain -Patient is complaining of some left-sided leg pain -Continue with gabapentin and have added tramadol -If continues to worsen will likely pursue further imaging -Continue with PT and OT efforts and reposition the patient  Obesity -Estimated body mass index is 32.58 kg/m as calculated from the following:   Height as of this encounter: 4\' 10"  (1.473 m).   Weight as of this encounter: 70.7 kg. -Weight Loss and Dietary Counseling given   DVT prophylaxis: SCDs in case she decides for LP; currently on hold  pharmacological prophylaxis for now until appears over with Code Status: FULL CODE  Family Communication: Discussed with Daughter at bedside and granddaughter over the phone Disposition Plan: Patient is from home and PT OT evaluated and recommending skilled nursing facility however patient and family is hesitant about going to rehab facility currently and likely will go home with home health but wanted to speak with the social worker prior to making any decision and they have been undecided about doing further testing for possible cerebral amyloid angiopathy related inflammation and have refused LP currently.  They have now decided on the LP  Status is: Inpatient  Remains inpatient appropriate because:Ongoing diagnostic testing needed not appropriate for outpatient work up and Unsafe d/c plan   Dispo: The patient is from: Home              Anticipated d/c is to: SNF              Anticipated d/c date is: 2 days              Patient currently is not medically stable to d/c.  Consultants:   Neurology   Procedures:  MRI's  Lumbar puncture to be done  Antimicrobials: Anti-infectives (From admission, onward)   Start     Dose/Rate Route Frequency Ordered Stop   11/18/19 1530  cefTRIAXone (ROCEPHIN) 1 g in sodium chloride 0.9 % 100 mL IVPB  Status:  Discontinued     1 g 200 mL/hr over 30 Minutes Intravenous Every 24 hours 11/18/19 1526 11/19/19 0429     Subjective: Seen and examined at bedside and she is at her baseline however she is complaining of some left leg pain.  No nausea or vomiting.  Denies any chest pain, lightheadedness or dizziness.  Was still undecided about LP this morning when I spoke with her however after I left the room she and her family decided to pursue the LP.  No other concerns or complaints at this time and still wanting to speak with social work about SNF.  Objective: Vitals:   11/20/19 2149 11/21/19 0611 11/21/19 0757 11/21/19 1339  BP: (!) 178/80 (!) 159/75   (!) 158/75  Pulse: (!) 105 90  (!) 101  Resp: 18 20 19 18   Temp: 98.1 F (36.7 C) 98.5 F (36.9 C)  98.2 F (36.8 C)  TempSrc: Oral Oral  Oral  SpO2: 99% 100%  99%  Weight:      Height:        Intake/Output Summary (Last 24 hours) at 11/21/2019 1401 Last data filed at 11/21/2019 1350 Gross per 24 hour  Intake 360 ml  Output 200 ml  Net 160 ml   Filed Weights   11/19/19 0200  Weight: 70.7 kg   Examination: Physical Exam:  Constitutional: WN/WD elderly African-American female currently in no acute distress but does appear little uncomfortable complaining of some leg pain Eyes: She is blind in her left eye ENMT: External  Ears, Nose appear normal. Grossly normal hearing. Neck: Appears normal, supple, no cervical masses, normal ROM, no appreciable thyromegaly; no JVD Respiratory: Diminished to auscultation bilaterally with slightly coarse breath sounds, no wheezing, rales, rhonchi or crackles. Normal respiratory effort and patient is not tachypenic. No accessory muscle use.  Unlabored breathing Cardiovascular: RRR, no murmurs / rubs / gallops. S1 and S2 auscultated.  Trace lower extremity edema Abdomen: Soft, non-tender, mildly distended secondary body habitus. Bowel sounds positive.  GU: Deferred. Musculoskeletal: No clubbing / cyanosis of digits/nails. No joint deformity upper and lower extremities.  Skin: No rashes, lesions, ulcers on limited skin evaluation. No induration; Warm and dry.  Neurologic: CN 2-12 grossly intact with no focal deficits. Romberg sign and cerebellar reflexes not assessed.  Psychiatric: Normal judgment and insight. Alert and oriented x 3. Normal mood and appropriate affect.   Data Reviewed: I have personally reviewed following labs and imaging studies  CBC: Recent Labs  Lab 11/18/19 1405 11/19/19 0442 11/20/19 0528 11/21/19 0424  WBC 6.9 6.5 5.4 6.3  NEUTROABS 5.7 4.8 4.0 4.1  HGB 11.4* 10.8* 10.1* 10.0*  HCT 37.7 35.3* 34.0* 32.8*  MCV 85.3  84.2 86.7 84.3  PLT 218 177 176 765   Basic Metabolic Panel: Recent Labs  Lab 11/18/19 1405 11/19/19 0442 11/20/19 0528 11/21/19 0424  NA 143 143 142 141  K 4.5 4.3 3.7 3.4*  CL 107 110 112* 109  CO2 23 23 20* 25  GLUCOSE 168* 118* 112* 115*  BUN 21 18 17 17   CREATININE 1.32* 1.06* 0.96 0.95  CALCIUM 9.3 8.5* 8.1* 8.0*  MG  --   --  2.0 2.0  PHOS  --   --  2.4* 2.6   GFR: Estimated Creatinine Clearance: 34.8 mL/min (by C-G formula based on SCr of 0.95 mg/dL). Liver Function Tests: Recent Labs  Lab 11/18/19 1405 11/19/19 0442 11/20/19 0528 11/21/19 0424  AST 66* 48* 37 26  ALT 28 22 20 20   ALKPHOS 85 69 57 54  BILITOT 0.9 1.3* 1.0 0.9  PROT 8.1 6.3* 5.7* 5.4*  ALBUMIN 3.8 2.9* 2.5* 2.3*   No results for input(s): LIPASE, AMYLASE in the last 168 hours. Recent Labs  Lab 11/19/19 0530  AMMONIA 50*   Coagulation Profile: Recent Labs  Lab 11/18/19 1603  INR 1.1   Cardiac Enzymes: Recent Labs  Lab 11/19/19 0442 11/20/19 0528  CKTOTAL 1,074* 690*   BNP (last 3 results) No results for input(s): PROBNP in the last 8760 hours. HbA1C: No results for input(s): HGBA1C in the last 72 hours. CBG: Recent Labs  Lab 11/20/19 0758 11/20/19 1658 11/21/19 0006 11/21/19 0849 11/21/19 1140  GLUCAP 104* 146* 113* 85 135*   Lipid Profile: No results for input(s): CHOL, HDL, LDLCALC, TRIG, CHOLHDL, LDLDIRECT in the last 72 hours. Thyroid Function Tests: Recent Labs    11/19/19 0530  TSH 1.775   Anemia Panel: No results for input(s): VITAMINB12, FOLATE, FERRITIN, TIBC, IRON, RETICCTPCT in the last 72 hours. Sepsis Labs: Recent Labs  Lab 11/18/19 1405 11/18/19 1603 11/19/19 0530 11/19/19 0716  LATICACIDVEN 2.9* 2.9* 1.3 1.5    Recent Results (from the past 240 hour(s))  Blood culture (routine x 2)     Status: None (Preliminary result)   Collection Time: 11/18/19  1:48 PM   Specimen: BLOOD  Result Value Ref Range Status   Specimen Description   Final      BLOOD LEFT ANTECUBITAL Performed at [facility], [place] [address]  [place], [place] [address]    Special Requests   Final    BOTTLES DRAWN AEROBIC AND ANAEROBIC Blood Culture results may not be optimal due to an excessive volume of blood received in culture bottles Performed at [facility] [address], [place], [place] [address]    Culture   Final    NO GROWTH 3 DAYS Performed at [facility], [place] [address] [place], [place] [address]    Report Status PENDING  Incomplete  Blood culture (routine x 2)     Status: None (Preliminary result)   Collection Time: 11/18/19  2:05 PM   Specimen: BLOOD  Result Value Ref Range Status   Specimen Description   Final    BLOOD RIGHT ANTECUBITAL Performed at [facility] [address], [place], [place] [address]    Special Requests   Final    BOTTLES DRAWN AEROBIC ONLY Blood Culture results may not be optimal due to an inadequate volume of blood received in culture bottles Performed at [facility] [address] [place], [place] [address]    Culture   Final    NO GROWTH 3 DAYS Performed at [facility], [place] [address], [place], [place] [address]    Report Status PENDING  Incomplete  Urine culture     Status: Abnormal   Collection Time: 11/18/19  2:31 PM   Specimen: In/Out Cath Urine  Result Value Ref Range Status   Specimen Description   Final    IN/OUT CATH URINE Performed at [facility] [address] [place], [place] [address]    Special Requests   Final    NONE Performed at [facility], [place] [address] [place], [place] [address]    Culture (A)  Final    5,000 COLONIES/mL GROUP B STREP(S.AGALACTIAE)ISOLATED TESTING AGAINST S. AGALACTIAE NOT ROUTINELY PERFORMED DUE TO PREDICTABILITY OF AMP/PEN/VAN SUSCEPTIBILITY. Performed at [facility], [place] [address], [place], [place] [address]     Report Status 11/20/2019 FINAL  Final  SARS CORONAVIRUS 2 (TAT 6-24 HRS) Nasopharyngeal Nasopharyngeal Swab     Status: None   Collection Time: 11/18/19 11:09 PM   Specimen: Nasopharyngeal Swab  Result Value Ref Range Status   SARS Coronavirus 2 NEGATIVE NEGATIVE Final    Comment: (NOTE) SARS-CoV-2 target nucleic acids are NOT DETECTED. The SARS-CoV-2 RNA is generally detectable in upper and lower respiratory specimens during the acute phase of infection. Negative results do not preclude SARS-CoV-2 infection, do not rule out co-infections with other pathogens, and should not be used as the sole basis for treatment or other patient management decisions. Negative results must be combined with clinical observations, patient history, and epidemiological information. The expected result is Negative. Fact Sheet for Patients: SugarRoll.be Fact Sheet for Healthcare Providers: https://www.woods-mathews.com/ This test is not yet approved or cleared by the [place] FDA and  has been authorized for detection and/or diagnosis of SARS-CoV-2 by FDA under an Emergency Use Authorization (EUA). This EUA will remain  in effect (meaning this test can be used) for the duration of the COVID-19 declaration under Section 56 4(b)(1) of the Act, 21 U.S.C. section 360bbb-3(b)(1), unless the authorization is terminated or revoked sooner. Performed at [facility], [place] [address] [place],  [address]      RN Pressure Injury Documentation:     Estimated body mass index is 32.58 kg/m as calculated from the following:   Height as  of this encounter: 4\' 10"  (1.473 m).   Weight as of this encounter: 70.7 kg.  Malnutrition Type:      Malnutrition Characteristics:      Nutrition Interventions:      Radiology Studies: No results found. Scheduled Meds: . aspirin EC  81 mg Oral Daily  . docusate sodium  50 mg Oral BID  . gabapentin  100 mg  Oral BID WC  . gabapentin  200 mg Oral QHS  . losartan  50 mg Oral Daily  . multivitamin with minerals  1 tablet Oral Daily  . potassium chloride  40 mEq Oral BID   Continuous Infusions:   LOS: 2 days   Kerney Elbe, DO [facility] PAGER is on AMION  If 7PM-7AM, please contact night-coverage [email or web address]

## 2019-11-21 NOTE — Progress Notes (Signed)
PT Cancellation Note  Patient Details Name: [name] MRN: [id number] DOB: 07/12/1930   Cancelled Treatment:    Reason Eval/Treat Not Completed: Pain limiting ability to participate(pt c/o leg pain, doesn't feel she can tolerate PT at present, notified RN of request for pain medication. Will follow.)   Philomena Doheny PT 11/21/2019  Acute Rehabilitation Services Pager [phone] Office [phone]

## 2019-11-21 NOTE — TOC Progression Note (Signed)
Transition of Care [facility]) - Progression Note    Patient Details  Name: [name] MRN: [id number] Date of Birth: 04-14-1930  Transition of Care [facility]) CM/SW Contact  Ross Ludwig, [place] Phone Number: 11/21/2019, 3:14 PM  Clinical Narrative:     CSW spent time talking with patient's daughter and granddaughter per her request.  They would like patient to go to SNF, however, patient has not agreed completely yet.  CSW was informed by patient's daughter and granddaughter, that they will discuss with patient once she returns from procedure.  CSW presented the pros and cons of going to a SNF verse going home with home health.  CSW left a list of bed offers for short term rehab, and they will discuss with patient.  CSW was given permission to begin insurance authorization.   Expected Discharge Plan: [place] Barriers to Discharge: Continued Medical Work up  Expected Discharge Plan and Services Expected Discharge Plan: [place]   Discharge Planning Services: CM Consult   Living arrangements for the past 2 months: Single Family Home                                       Social Determinants of Health (SDOH) Interventions    Readmission Risk Interventions No flowsheet data found.

## 2019-11-21 NOTE — Progress Notes (Signed)
Physical Therapy Treatment Patient Details Name: [name] MRN: [id number] DOB: 10-20-1929 Today's Date: 11/21/2019    History of Present Illness 86 y.o. female with history of hypertension chronic lower extremity edema was brought to the ER after patient's daughter found the patient was confused. MRI of brain: Findings consistent with either amyloid beta related angiitis or inflammatory cerebral amyloid angiopathy. No evidence of metastatic disease.    PT Comments    Max assist to roll to R, then for sidelying to sit. Pt sat at edge of bed x 23 minutes, she has poor sitting balance with frequent posterior lean. In sitting we worked on postural exercises and reaching activities. Attempted to stand x 2, pt unable to clear hips from bed to rise to stand. Pt required increased assist for mobility today.    Follow Up Recommendations  SNF;Supervision/Assistance - 24 hour     Equipment Recommendations  Wheelchair cushion (measurements PT);Wheelchair (measurements PT)    Recommendations for Other Services       Precautions / Restrictions Precautions Precautions: Fall Precaution Comments: Patient reports falls "all over the place" but doesn't report timing of falls Restrictions Weight Bearing Restrictions: No    Mobility  Bed Mobility Overal bed mobility: Needs Assistance Bed Mobility: Rolling;Sidelying to Sit;Sit to Supine Rolling: Max assist Sidelying to sit: Max assist   Sit to supine: Total assist   General bed mobility comments: assist to initiate movement, assist to advance BLEs and to raise trunk, VCs for technique  Transfers Overall transfer level: Needs assistance Equipment used: Rolling walker (2 wheeled) Transfers: Sit to/from Stand Sit to Stand: Total assist         General transfer comment: attempted sit to stand with RW, then with bear hug technique, pt unable to clear hips to rise up from bed with both attempts  Ambulation/Gait              General Gait Details: unable   Stairs             Wheelchair Mobility    Modified Rankin (Stroke Patients Only)       Balance Overall balance assessment: Needs assistance Sitting-balance support: Feet supported;Single extremity supported Sitting balance-Leahy Scale: Poor Sitting balance - Comments: relies on single UE support, posterior lean Postural control: Posterior lean Standing balance support: Bilateral upper extremity supported Standing balance-Leahy Scale: Zero Standing balance comment: reliant upon BUE support               High Level Balance Comments: Pt sat edge of bed x 23 minutes; worked on trunk rotation AAROM, scapular retraction AAROM, cervical rotation AROM, forward reaching with assistance. Pt sits with flexed neck, can hold head erect for short periods.            Cognition Arousal/Alertness: Awake/alert Behavior During Therapy: WFL for tasks assessed/performed Overall Cognitive Status: Impaired/Different from baseline Area of Impairment: Problem solving;Safety/judgement                       Following Commands: Follows one step commands inconsistently Safety/Judgement: Decreased awareness of deficits   Problem Solving: Slow processing;Decreased initiation General Comments: increased processing time, decreased awareness of deficits, can follow 1 step commands with increased time      Exercises General Exercises - Lower Extremity Long Arc Quad: AROM;Both;15 reps;Seated    General Comments        Pertinent Vitals/Pain Pain Assessment: Faces Faces Pain Scale: Hurts even more Pain Location: RLE (knee and ankle)  at rest and with movement Pain Descriptors / Indicators: Grimacing;Guarding Pain Intervention(s): Limited activity within patient's tolerance;Monitored during session;Patient requesting pain meds-RN notified;Repositioned    Home Living                      Prior Function            PT Goals (current  goals can now be found in the care plan section) Acute Rehab PT Goals Patient Stated Goal: to get stronger, be able to walk PT Goal Formulation: With patient/family Time For Goal Achievement: 12/03/19 Potential to Achieve Goals: Fair Progress towards PT goals: Not progressing toward goals - comment(required increased assistance for mobility today)    Frequency    Min 2X/week      PT Plan Current plan remains appropriate    Co-evaluation              AM-PAC PT "6 Clicks" Mobility   Outcome Measure  Help needed turning from your back to your side while in a flat bed without using bedrails?: A Lot Help needed moving from lying on your back to sitting on the side of a flat bed without using bedrails?: A Lot Help needed moving to and from a bed to a chair (including a wheelchair)?: Total Help needed standing up from a chair using your arms (e.g., wheelchair or bedside chair)?: Total Help needed to walk in hospital room?: Total Help needed climbing 3-5 steps with a railing? : Total 6 Click Score: 8    End of Session Equipment Utilized During Treatment: Gait belt Activity Tolerance: Patient tolerated treatment well Patient left: in bed;with call bell/phone within reach;with bed alarm set;with family/visitor present Nurse Communication: Mobility status(need for linen change) PT Visit Diagnosis: Difficulty in walking, not elsewhere classified (R26.2);Muscle weakness (generalized) (M62.81);Other abnormalities of gait and mobility (R26.89)     Time: 2446-2863 PT Time Calculation (min) (ACUTE ONLY): 42 min  Charges:  $Therapeutic Exercise: 8-22 mins $Therapeutic Activity: 23-37 mins                     [name] PT 11/21/2019  Acute Rehabilitation Services Pager [phone] Office [phone]

## 2019-11-21 NOTE — Care Management Important Message (Signed)
Important Message  Patient Details IM Letter given to Evette Cristal SW Case Manager to present to the Patient Name: [name] MRN: [id number] Date of Birth: Sep 06, 1929   Medicare Important Message Given:  Yes     Kerin Salen 11/21/2019, 11:35 AM

## 2019-11-22 ENCOUNTER — Inpatient Hospital Stay (HOSPITAL_COMMUNITY): Payer: Medicare Other

## 2019-11-22 DIAGNOSIS — R29898 Other symptoms and signs involving the musculoskeletal system: Secondary | ICD-10-CM

## 2019-11-22 DIAGNOSIS — I680 Cerebral amyloid angiopathy: Secondary | ICD-10-CM

## 2019-11-22 LAB — CBC WITH DIFFERENTIAL/PLATELET
Abs Immature Granulocytes: 0.03 10*3/uL (ref 0.00–0.07)
Basophils Absolute: 0 10*3/uL (ref 0.0–0.1)
Basophils Relative: 0 %
Eosinophils Absolute: 0.1 10*3/uL (ref 0.0–0.5)
Eosinophils Relative: 2 %
HCT: 30.7 % — ABNORMAL LOW (ref 36.0–46.0)
Hemoglobin: 9.2 g/dL — ABNORMAL LOW (ref 12.0–15.0)
Immature Granulocytes: 1 %
Lymphocytes Relative: 17 %
Lymphs Abs: 1 10*3/uL (ref 0.7–4.0)
MCH: 25.6 pg — ABNORMAL LOW (ref 26.0–34.0)
MCHC: 30 g/dL (ref 30.0–36.0)
MCV: 85.5 fL (ref 80.0–100.0)
Monocytes Absolute: 0.9 10*3/uL (ref 0.1–1.0)
Monocytes Relative: 15 %
Neutro Abs: 3.8 10*3/uL (ref 1.7–7.7)
Neutrophils Relative %: 65 %
Platelets: 181 10*3/uL (ref 150–400)
RBC: 3.59 MIL/uL — ABNORMAL LOW (ref 3.87–5.11)
RDW: 15.7 % — ABNORMAL HIGH (ref 11.5–15.5)
WBC: 5.8 10*3/uL (ref 4.0–10.5)
nRBC: 0 % (ref 0.0–0.2)

## 2019-11-22 LAB — COMPREHENSIVE METABOLIC PANEL
ALT: 26 U/L (ref 0–44)
AST: 36 U/L (ref 15–41)
Albumin: 2.2 g/dL — ABNORMAL LOW (ref 3.5–5.0)
Alkaline Phosphatase: 58 U/L (ref 38–126)
Anion gap: 9 (ref 5–15)
BUN: 29 mg/dL — ABNORMAL HIGH (ref 8–23)
CO2: 24 mmol/L (ref 22–32)
Calcium: 8.3 mg/dL — ABNORMAL LOW (ref 8.9–10.3)
Chloride: 109 mmol/L (ref 98–111)
Creatinine, Ser: 1.27 mg/dL — ABNORMAL HIGH (ref 0.44–1.00)
GFR calc Af Amer: 44 mL/min — ABNORMAL LOW (ref >60)
GFR calc non Af Amer: 38 mL/min — ABNORMAL LOW (ref >60)
Glucose, Bld: 144 mg/dL — ABNORMAL HIGH (ref 70–99)
Potassium: 4.7 mmol/L (ref 3.5–5.1)
Sodium: 142 mmol/L (ref 135–145)
Total Bilirubin: 0.7 mg/dL (ref 0.3–1.2)
Total Protein: 5.5 g/dL — ABNORMAL LOW (ref 6.5–8.1)

## 2019-11-22 LAB — GLUCOSE, CAPILLARY
Glucose-Capillary: 153 mg/dL — ABNORMAL HIGH (ref 70–99)
Glucose-Capillary: 104 mg/dL — ABNORMAL HIGH (ref 70–99)
Glucose-Capillary: 214 mg/dL — ABNORMAL HIGH (ref 70–99)

## 2019-11-22 LAB — MAGNESIUM: Magnesium: 2.2 mg/dL (ref 1.7–2.4)

## 2019-11-22 LAB — PHOSPHORUS: Phosphorus: 2.2 mg/dL — ABNORMAL LOW (ref 2.5–4.6)

## 2019-11-22 MED ORDER — K PHOS MONO-SOD PHOS DI & MONO 155-852-130 MG PO TABS
500.0000 mg | ORAL_TABLET | Freq: Once | ORAL | Status: AC
  Administered 2019-11-22: 500 mg via ORAL
  Filled 2019-11-22: qty 2

## 2019-11-22 MED ORDER — HEPARIN SODIUM (PORCINE) 5000 UNIT/ML IJ SOLN
5000.0000 [IU] | Freq: Three times a day (TID) | INTRAMUSCULAR | Status: DC
  Administered 2019-11-22 – 2019-11-29 (×22): 5000 [IU] via SUBCUTANEOUS
  Filled 2019-11-22 (×22): qty 1

## 2019-11-22 MED ORDER — SODIUM CHLORIDE 0.9 % IV SOLN
1000.0000 mg | Freq: Every day | INTRAVENOUS | Status: AC
  Administered 2019-11-22 – 2019-11-26 (×5): 1000 mg via INTRAVENOUS
  Filled 2019-11-22 (×5): qty 8

## 2019-11-22 MED ORDER — SODIUM CHLORIDE 0.9 % IV SOLN: INTRAVENOUS | Status: AC

## 2019-11-22 NOTE — Progress Notes (Signed)
Neurology Progress Note   S:// Seen and examined at [facility] an MRI. There was concern for right arm weakness this morning different from before.   O:// Current vital signs: BP (!) 154/65 (BP Location: Right Arm)   Pulse (!) 101   Temp 100.1 F (37.8 C) (Oral)   Resp 20   Ht 4\' 10"  (1.473 m)   Wt 70.7 kg   SpO2 100%   BMI 32.58 kg/m  Vital signs in last 24 hours: Temp:  [97.8 F (36.6 C)-100.1 F (37.8 C)] 100.1 F (37.8 C) (04/24 1341) Pulse Rate:  [89-101] 101 (04/24 1341) Resp:  [17-20] 20 (04/24 1341) BP: (126-154)/(55-65) 154/65 (04/24 1341) SpO2:  [96 %-100 %] 100 % (04/24 1341) General: She is drowsy, in no acute distress HEENT: Normocephalic atraumatic CVS regular rate rhythm Respiratory, clear Neurological exam Drowsy, opens eyes to voice, follows all commands. Is able to tell me the month, where she is at and her correct age. There is no evidence of aphasia. There is no evidence of dysarthria Cranial nerves: Left pupil-corneal opacity, right pupil reactive.  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  No ptosis.  Symmetric smile.  Mildly decreased hearing bilaterally.  Tongue and palate midline.   Motor exam: Right upper extremity-limited by pain but is antigravity.  Left upper extremity limited by joint stiffness and pain but antigravity.  Both lower extremity have severe edema and cannot lift antigravity. Sensory exam: Intact Coordination difficult to assess  Medications  Current Facility-Administered Medications:  .  0.9 %  sodium chloride infusion, , Intravenous, Continuous, Raiford Noble [place], [place], Last Rate: 75 mL/hr at 11/22/19 1022, New Bag at 11/22/19 1022 .  aspirin EC tablet 81 mg, 81 mg, Oral, Daily, Raiford Noble Latif, DO, 81 mg at 11/22/19 1018 .  docusate sodium (COLACE) capsule 50 mg, 50 mg, Oral, BID, Sheikh, Omair Latif, DO, 50 mg at 11/22/19 1018 .  gabapentin (NEURONTIN) capsule 100 mg, 100 mg, Oral, BID WC, Sheikh,  Omair Latif, DO, 100 mg at 11/22/19 1356 .  gabapentin (NEURONTIN) capsule 200 mg, 200 mg, Oral, QHS, Sheikh, Omair Latif, DO, 200 mg at 11/22/19 0007 .  heparin injection 5,000 Units, 5,000 Units, Subcutaneous, Q8H, Sheikh, Omair Latif, DO .  hydrALAZINE (APRESOLINE) injection 10 mg, 10 mg, Intravenous, Q4H PRN, Rise Patience, MD .  methylPREDNISolone sodium succinate (SOLU-MEDROL) 1,000 mg in sodium chloride 0.9 % 50 mL IVPB, 1,000 mg, Intravenous, Daily, Raiford Noble Latif, DO, Last Rate: 58 mL/hr at 11/22/19 1355, 1,000 mg at 11/22/19 1355 .  multivitamin with minerals tablet 1 tablet, 1 tablet, Oral, Daily, Raiford Noble [place], [place], 1 tablet at 11/22/19 1018 .  ondansetron (ZOFRAN) tablet 4 mg, 4 mg, Oral, Q6H PRN **OR** ondansetron (ZOFRAN) injection 4 mg, 4 mg, Intravenous, Q6H PRN, Rise Patience, MD .  polyvinyl alcohol (LIQUIFILM TEARS) 1.4 % ophthalmic solution 1 drop, 1 drop, Both Eyes, TID PRN, Alfredia Ferguson, Omair Latif, DO .  traMADol Veatrice [place]) tablet 50 mg, 50 mg, Oral, Q6H PRN, Raiford Noble [place], DO, 50 mg at 11/21/19 2109 Labs CBC    Component Value Date/Time   WBC 5.8 11/22/2019 0432   RBC 3.59 (L) 11/22/2019 0432   HGB 9.2 (L) 11/22/2019 0432   HGB 9.8 (L) 03/24/2019 0926   HCT 30.7 (L) 11/22/2019 0432   HCT 30.7 (L) 03/24/2019 0926   PLT 181 11/22/2019 0432   PLT 216 03/24/2019 0926   MCV 85.5 11/22/2019 0432   MCV 81  03/24/2019 0926   MCH 25.6 (L) 11/22/2019 0432   MCHC 30.0 11/22/2019 0432   RDW 15.7 (H) 11/22/2019 0432   RDW 13.7 03/24/2019 0926   LYMPHSABS 1.0 11/22/2019 0432   LYMPHSABS 1.5 03/24/2019 0926   MONOABS 0.9 11/22/2019 0432   EOSABS 0.1 11/22/2019 0432   EOSABS 0.1 03/24/2019 0926   BASOSABS 0.0 11/22/2019 0432   BASOSABS 0.0 03/24/2019 0926    CMP     Component Value Date/Time   NA 142 11/22/2019 0432   NA 132 (L) 03/24/2019 0926   K 4.7 11/22/2019 0432   CL 109 11/22/2019 0432   CO2 24 11/22/2019 0432   GLUCOSE 144 (H)  11/22/2019 0432   BUN 29 (H) 11/22/2019 0432   BUN 27 03/24/2019 0926   CREATININE 1.27 (H) 11/22/2019 0432   CALCIUM 8.3 (L) 11/22/2019 0432   PROT 5.5 (L) 11/22/2019 0432   PROT 6.4 03/24/2019 0926   ALBUMIN 2.2 (L) 11/22/2019 0432   ALBUMIN 3.6 03/24/2019 0926   AST 36 11/22/2019 0432   ALT 26 11/22/2019 0432   ALKPHOS 58 11/22/2019 0432   BILITOT 0.7 11/22/2019 0432   BILITOT 0.5 03/24/2019 0926   GFRNONAA 38 (L) 11/22/2019 0432   GFRAA 44 (L) 11/22/2019 0432   Imaging I have reviewed images in epic and the results pertinent to this consultation are: MRI examination of the brain without contrast, with contrast and a repeat MRI brain without contrast today-demonstrated areas of vasogenic edema in the right parietal and left temporal lobe most compatible with amyloid related inflammation such as amyloid beta related angiitis.   Assessment: 86 year old woman past history of altered mental status and acute to subacute vasogenic edema on the MRI involving the right occipital temporal region on the MRI done which is stable from 4 days ago. Has a history of headaches and memory loss. These findings are consistent with cerebral amyloid angiopathy related inflammation, also referred to as amyloid beta related angiitis. She has had a waxing and waning mentation which could also be what has been referred to as-amyloid spells Her lumbar puncture was performed CSF showed glucose of 79, 0 WBCs, 49 RBCs, protein 80. Not consistent with any kind of infection.  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 There have been case reports of having used other medication such as Cytoxan in the longer term for immune suppression but I do not personally have any experience or significant evidence from literature to support this use. I will refer him to outpatient neurology-can follow-up with [facility]  neurology for further outpatient management. Inpatient neurology will be available as needed. I discussed my plan in detail with Dr. Shelton Silvas, hospitalist.  -- Amie Portland, MD [facility] Pager: [phone] If 7pm to 7am, please call on call as listed on AMION.

## 2019-11-22 NOTE — Progress Notes (Signed)
PROGRESS NOTE    [name]  JJH:[id number] DOB: 1929-08-13 DOA: 11/18/2019 PCP: Kerin Perna, NP   Brief Narrative:  The patient is an 86 year old African-American female with past medical history significant for but not limited to history of CKD, morbid obesity, hypertension, bilateral lower extremity leg edema as well as osteoarthritis who presented to [facility] via EMS for evaluation of AMS which has been going on for last 2 days.  Family was worried about a UTI as UTIs caused her AMS in the past.  She is noted to have bilateral lower extremity edema in the ED and her lactate was elevated.  Her urinalysis however was negative as well as her chest x-ray.  A CT of the head was obtained revealing a new areas of hyperintense T2 weighted signal within the right occipital and right anterior temporal white matter.  Imaging was recommended with IV contrast with MRI and this was done.  Neurology was consulted for further evaluation recommendations and they initially recommended repeating the MRI of the brain with contrast.  I spoke with Dr. Amie Portland who is going to be discussing the case with some of his colleagues.  Repeat MRI showed  "Findings consistent with either amyloid beta related angiitis or inflammatory cerebral amyloid angiopathy. No evidence of metastatic disease. I doubt the diagnoses of atypical PRES, prior ischemic infarction, prior trauma or PML, which were considered." Her lactic acidosis has improved and she is much more awake and alert today and she was reportedly.  SLP came to evaluate the patient and they are recommending a regular diet with thin liquids.  Patient's renal function is improving however her CK was elevated on admission.  We will continue IV fluid hydration with normal saline at 75 mL's per hour for next 12 hours and obtain a PT and OT consultation.  **Interim History IV fluids have now stopped and there is concern for cerebral amyloid angiopathy related  inflammation based on her imaging.  Neurology wants to rule out other sources and causes such as viral infection or autoimmune diseases and have recommended an LP but patient and family was undecided and likely did not want to pursue it however have now decided they want to get it done. Patient was evaluated by PT and OT and they are recommending SNF however patient and family want to talk to Social Work about placement. Have called IR to arrange for LP to be Done Under Fluoro   11/22/2019: She underwent fluoroscopy guided LP yesterday and the LP studies have been reviewed and neurology does not feel that she has an acute infection so the recommend starting steroids.  She was started on 1 g of Solu-Medrol for 5 days.  This morning when I went to see her she was more confused and unable to move her right arm so we will obtain a stat head MRI which is still pending.  Neurology was notified and they will see the patient later today.  Assessment & Plan:   Principal Problem:   Acute encephalopathy Active Problems:   Hypertension  Acute Encephalopathy with abnormal MRI brain -Neurology was consulted and they recommended MRI of the brain with contrast.  -Patient had some difficulty swallowing get speech therapist evaluation evaluated and recommending a regular diet with thin liquids.  -In addition we will check ammonia TSH B12; ammonia was elevated at 50 and was within normal limits at 1.775 -Patient was found to have an elevated CK and was continued on IV fluid hydration gently  with normal saline at 75 MLS per hour and had initially stopped but will be resumed.  CK is now trending down from 1074 now 690 -MRI of the brain with contrast showed "Findings consistent with either amyloid beta related angiitis or inflammatory cerebral amyloid angiopathy. No evidence of metastatic disease. I doubt the diagnoses of atypical  PRES, prior ischemic infarction, prior trauma or PML, which were considered." -Dr. Rory Percy  evaluated and reviewed her imaging and felt that there is no enhancement seen. Per his note: "Vasogenic edema in the occipital cortex and temporal cortex unilaterally with susceptibility weighted images showing multiple foci of hemosiderin deposit peripherally and an amyloid angiopathy pattern.";  He feels that the main considerations are for amyloid beta related angiitis or inflammatory cerebral amyloid angiopathy/cerebral amyloid angiopathy related inflammation is at the top of the differential -Patient underwent fluoroscopy guided LP yesterday -Dr. Rory Percy does not have a high suspicion for bacterial infection but feel that a viral process or autoimmune inflammatory process could be presenting like this but he feels that imaging more feels like cerebral amyloid angiopathy related inflammation -PT OT evaluated and recommending SNF however patient and daughter are hesitant about going to SNF currently and wants to speak with Social Work -LP done and showed 79 glucose, 49 RBCs, 0 WBCs, too few to count other cells, 80 total protein, with CSF culture still pending -Neurology recommends starting the patient on high-dose Solu-Medrol 1 g daily for 5 days -She also had some worsening mental status this morning and was more confused and unable to lift her right arm and it was very weak compared to her left arm so we will obtain a stat another stat head MRI to evaluate for CVA; neurology was notified and they do not recommend calling a code stroke currently and they will see the patient later on today.  Neurology feels that she is having "amyloid spells"  Hypertension  -She is on IV hydralazine as needed now every 4 hours for systolic blood pressure greater than 416 or diastolic blood pressure greater than 110 -We resumed her home losartan 50 mg p.o. daily yesterday we will stop it again today given her AKI -Patient blood pressure is 130/55  Lower Extremity Edema  -On diuretics as an outpatient but we are  currently holding these and will now stop IVF as well -Continue to monitor carefully  Right Arm Pain and Weakness -Is more swollen compared to the left -We will check approximately duplex to rule out DVT -Neurology evaluated and does not feel that it is secondary to any neurological deficit  AKI on chronic Kidney Disease Stage III Metabolic acidosis, improved -Creatinine appears to be at baseline and is improved -She did have a mild metabolic acidosis with a CO2 of 20, and anion gap of 10, and chloride level of 112; now CO2 is 24, anion gap is 9, and chloride level is 109 -Given Gentle IVF Hydration with NS was stopped yesterday but now has been restarted at 75 MLS per hour -BUN/Cr is trending down and went from 21/1.32 on admission and had trended down to 17/0.95 but is now slightly worsened again 29/1.27 -Avoid nephrotoxic medications, contrast dyes, hypotension and renally adjust medications -Continue to monitor and trend renal function -Repeat CMP in a.m.  Hypophosphatemia -Patient's phosphorus level was 2.2 this morning -Replete with p.o. K-Phos Neutral 500 mg x 1  -Continue to monitor and replete as necessary -Repeat phosphorus level in the a.m.  Normocytic Anemia  -Appears to be chronic.  -  Follow CBC.  -Hemoglobin/hematocrit went from 10.1/34.0 -> 10.0/32.8 -> 9.2/30.7 -Will check anemia panel in the AM. -Continue to monitor for signs and symptoms of bleeding; currently no overt bleeding noted -Repeat CBC in a.m.  Hypokalemia -Patient is potassium was 3.4 and now improved to 4.7 -Continue to monitor and replete as necessary -Repeat CMP in the a.m.  Leg pain -Patient is complaining of some left-sided leg pain -Continue with gabapentin and have added tramadol -If continues to worsen will likely pursue further imaging -Continue with PT and OT efforts and reposition the patient  Obesity -Estimated body mass index is 32.58 kg/m as calculated from the following:    Height as of this encounter: 4\' 10"  (1.473 m).   Weight as of this encounter: 70.7 kg. -Weight Loss and Dietary Counseling given   DVT prophylaxis: SCDs in case she decides for LP; currently on hold pharmacological prophylaxis for now until appears over with Code Status: FULL CODE  Family Communication: Discussed with Daughter at bedside and granddaughter over the phone Disposition Plan: Patient is from home and PT OT evaluated and recommending skilled nursing facility however patient and family is hesitant about going to rehab facility currently and likely will go home with home health but wanted to speak with the social worker prior to making any decision and they have been undecided about doing further testing for possible cerebral amyloid angiopathy related inflammation and have refused LP currently.  They have now decided on the LP  Status is: Inpatient  Remains inpatient appropriate because:Ongoing diagnostic testing needed not appropriate for outpatient work up and Unsafe d/c plan   Dispo: The patient is from: Home              Anticipated d/c is to: SNF              Anticipated d/c date is: 2 days              Patient currently is not medically stable to d/c.  Consultants:   Neurology   Procedures:  MRI's  Lumbar Puncture  Antimicrobials: Anti-infectives (From admission, onward)   Start     Dose/Rate Route Frequency Ordered Stop   11/18/19 1530  cefTRIAXone (ROCEPHIN) 1 g in sodium chloride 0.9 % 100 mL IVPB  Status:  Discontinued     1 g 200 mL/hr over 30 Minutes Intravenous Every 24 hours 11/18/19 1526 11/19/19 0429     Subjective: Seen and examined at bedside more confused and not as responsive as she was yesterday.  Had trouble thinking of her words and daughter states that she is having difficulty with her right arm now.  Patient is having difficulty lifting it and is also having significant weakness.  Patient did not have any nausea or vomiting but patient's  daughter thinks that symptoms started after her LP yesterday.  No other concerns or complaints at this time.  Objective: Vitals:   11/21/19 1507 11/21/19 2159 11/21/19 2315 11/22/19 0637  BP: (!) 158/81  (!) 126/58 (!) 130/55  Pulse: (!) 102  89 92  Resp: 20 17  18   Temp: 99.1 F (37.3 C)  98.4 F (36.9 C) 97.8 F (36.6 C)  TempSrc: Oral  Oral Oral  SpO2: 100%  96% 100%  Weight:      Height:        Intake/Output Summary (Last 24 hours) at 11/22/2019 1317 Last data filed at 11/22/2019 0015 Gross per 24 hour  Intake 120 ml  Output -  Net  120 ml   Filed Weights   11/19/19 0200  Weight: 70.7 kg   Examination: Physical Exam:  Constitutional: WN/WD elderly African-American female currently who is more confused and having some slight difficulty raising her right arm and having pain; she is not alert like she was previously and daughter states this was like she was prior to admission Eyes: She is blind in her left eye ENMT: External Ears, Nose appear normal. Grossly normal hearing.  Neck: Appears normal, supple, no cervical masses, normal ROM, no appreciable thyromegaly; no JVD Respiratory: Diminished to auscultation bilaterally, no wheezing, rales, rhonchi or crackles. Normal respiratory effort and patient is not tachypenic. No accessory muscle use.  Unlabored breathing Cardiovascular: RRR, no murmurs / rubs / gallops. S1 and S2 auscultated.  Has slight lower extremity pedal edema  Abdomen: Soft, non-tender, distended secondary to body habitus. Bowel sounds positive.  GU: Deferred. Musculoskeletal: No clubbing / cyanosis of digits/nails. No joint deformity upper and lower extremities.  Skin: No rashes, lesions, ulcers on a limited skin evaluation. No induration; Warm and dry.  Neurologic: She is slower to respond and having some mild aphasia.  Having difficulty with grip strength on the right Psychiatric: Impaired judgment and insight.  Awake but she is not oriented.  Slightly  somnolent mood and appropriate affect.   Data Reviewed: I have personally reviewed following labs and imaging studies  CBC: Recent Labs  Lab 11/18/19 1405 11/19/19 0442 11/20/19 0528 11/21/19 0424 11/22/19 0432  WBC 6.9 6.5 5.4 6.3 5.8  NEUTROABS 5.7 4.8 4.0 4.1 3.8  HGB 11.4* 10.8* 10.1* 10.0* 9.2*  HCT 37.7 35.3* 34.0* 32.8* 30.7*  MCV 85.3 84.2 86.7 84.3 85.5  PLT 218 177 176 182 010   Basic Metabolic Panel: Recent Labs  Lab 11/18/19 1405 11/19/19 0442 11/20/19 0528 11/21/19 0424 11/22/19 0432  NA 143 143 142 141 142  K 4.5 4.3 3.7 3.4* 4.7  CL 107 110 112* 109 109  CO2 23 23 20* 25 24  GLUCOSE 168* 118* 112* 115* 144*  BUN 21 18 17 17  29*  CREATININE 1.32* 1.06* 0.96 0.95 1.27*  CALCIUM 9.3 8.5* 8.1* 8.0* 8.3*  MG  --   --  2.0 2.0 2.2  PHOS  --   --  2.4* 2.6 2.2*   GFR: Estimated Creatinine Clearance: 26 mL/min (A) (by C-G formula based on SCr of 1.27 mg/dL (H)). Liver Function Tests: Recent Labs  Lab 11/18/19 1405 11/19/19 0442 11/20/19 0528 11/21/19 0424 11/22/19 0432  AST 66* 48* 37 26 36  ALT 28 22 20 20 26   ALKPHOS 85 69 57 54 58  BILITOT 0.9 1.3* 1.0 0.9 0.7  PROT 8.1 6.3* 5.7* 5.4* 5.5*  ALBUMIN 3.8 2.9* 2.5* 2.3* 2.2*   No results for input(s): LIPASE, AMYLASE in the last 168 hours. Recent Labs  Lab 11/19/19 0530  AMMONIA 50*   Coagulation Profile: Recent Labs  Lab 11/18/19 1603  INR 1.1   Cardiac Enzymes: Recent Labs  Lab 11/19/19 0442 11/20/19 0528  CKTOTAL 1,074* 690*   BNP (last 3 results) No results for input(s): PROBNP in the last 8760 hours. HbA1C: No results for input(s): HGBA1C in the last 72 hours. CBG: Recent Labs  Lab 11/21/19 0849 11/21/19 1140 11/21/19 1632 11/22/19 0231 11/22/19 1020  GLUCAP 85 135* 113* 153* 104*   Lipid Profile: No results for input(s): CHOL, HDL, LDLCALC, TRIG, CHOLHDL, LDLDIRECT in the last 72 hours. Thyroid Function Tests: No results for input(s): TSH, T4TOTAL, FREET4, T3FREE,  THYROIDAB in the last 72 hours. Anemia Panel: No results for input(s): VITAMINB12, FOLATE, FERRITIN, TIBC, IRON, RETICCTPCT in the last 72 hours. Sepsis Labs: Recent Labs  Lab 11/18/19 1405 11/18/19 1603 11/19/19 0530 11/19/19 0716  LATICACIDVEN 2.9* 2.9* 1.3 1.5    Recent Results (from the past 240 hour(s))  Blood culture (routine x 2)     Status: None (Preliminary result)   Collection Time: 11/18/19  1:48 PM   Specimen: BLOOD  Result Value Ref Range Status   Specimen Description   Final    BLOOD LEFT ANTECUBITAL Performed at [facility], [place] [address] [place], [place] [address]    Special Requests   Final    BOTTLES DRAWN AEROBIC AND ANAEROBIC Blood Culture results may not be optimal due to an excessive volume of blood received in culture bottles Performed at [facility] [address] [place], [place] [address]    Culture   Final    NO GROWTH 3 DAYS Performed at [facility], [place] [address] [place], [place] [address]    Report Status PENDING  Incomplete  Blood culture (routine x 2)     Status: None (Preliminary result)   Collection Time: 11/18/19  2:05 PM   Specimen: BLOOD  Result Value Ref Range Status   Specimen Description   Final    BLOOD RIGHT ANTECUBITAL Performed at [facility] [address], [place], [place] [address]    Special Requests   Final    BOTTLES DRAWN AEROBIC ONLY Blood Culture results may not be optimal due to an inadequate volume of blood received in culture bottles Performed at [facility] [address] [place], [place] [address]    Culture   Final    NO GROWTH 3 DAYS Performed at [facility], [place] [address] [place], [place] [address]    Report Status PENDING  Incomplete  Urine culture     Status: Abnormal   Collection Time: 11/18/19  2:31 PM   Specimen: In/Out Cath Urine  Result Value Ref Range Status   Specimen Description   Final     IN/OUT CATH URINE Performed at [facility] [address] [place], [place] [address]    Special Requests   Final    NONE Performed at [facility], [place] [address], [place], [place] [address]    Culture (A)  Final    5,000 COLONIES/mL GROUP B STREP(S.AGALACTIAE)ISOLATED TESTING AGAINST S. AGALACTIAE NOT ROUTINELY PERFORMED DUE TO PREDICTABILITY OF AMP/PEN/VAN SUSCEPTIBILITY. Performed at [facility], [place] [address], [place], [place] [address]    Report Status 11/20/2019 FINAL  Final  SARS CORONAVIRUS 2 (TAT 6-24 HRS) Nasopharyngeal Nasopharyngeal Swab     Status: None   Collection Time: 11/18/19 11:09 PM   Specimen: Nasopharyngeal Swab  Result Value Ref Range Status   SARS Coronavirus 2 NEGATIVE NEGATIVE Final    Comment: (NOTE) SARS-CoV-2 target nucleic acids are NOT DETECTED. The SARS-CoV-2 RNA is generally detectable in upper and lower respiratory specimens during the acute phase of infection. Negative results do not preclude SARS-CoV-2 infection, do not rule out co-infections with other pathogens, and should not be used as the sole basis for treatment or other patient management decisions. Negative results must be combined with clinical observations, patient history, and epidemiological information. The expected result is Negative. Fact Sheet for Patients: SugarRoll.be Fact Sheet for Healthcare Providers: https://www.woods-mathews.com/ This test is not yet  approved or cleared by the [place] and  has been authorized for detection and/or diagnosis of SARS-CoV-2 by FDA under an Emergency Use Authorization (EUA). This EUA will remain  in effect (meaning this test can be used) for the duration of the COVID-19 declaration under Section 56 4(b)(1) of the Act, 21 U.S.C. section 360bbb-3(b)(1), unless the authorization is terminated or revoked sooner. Performed at [place], [place] [address], [place], [place] [address]   CSF culture     Status: None (Preliminary result)   Collection Time: 11/21/19  3:03 PM   Specimen: PATH Cytology CSF; Cerebrospinal Fluid  Result Value Ref Range Status   Specimen Description   Final    CSF Performed at [facility] [address], [place], [place] [address]    Special Requests   Final    NONE Performed at [facility], [place] [address], [place], [place] [address]    Gram Stain   Final    NO WBC SEEN NO ORGANISMS SEEN CYTOSPIN SMEAR Gram Stain Report Called to,Read Back By and Verified With: S.ADAMSON AT 1805 ON 11/21/19 BY N.THOMPSON Performed at [facility], [place] [address] [place], [place] [address]    Culture   Final    NO GROWTH < 12 HOURS Performed at [facility] [address], [place], [place] [address]    Report Status PENDING  Incomplete     RN Pressure Injury Documentation:     Estimated body mass index is 32.58 kg/m as calculated from the following:   Height as of this encounter: 4\' 10"  (1.473 m).   Weight as of this encounter: 70.7 kg.  Malnutrition Type:      Malnutrition Characteristics:      Nutrition Interventions:    Radiology Studies: DG FLUORO GUIDE LUMBAR PUNCTURE  Result Date: 11/21/2019 CLINICAL DATA:  Encephalopathy. Patient presents for fluoroscopically guided lumbar puncture for CSF acquisition. EXAM: DIAGNOSTIC LUMBAR PUNCTURE UNDER FLUOROSCOPIC GUIDANCE FLUOROSCOPY TIME:  Fluoroscopy Time:  40 seconds Radiation Exposure Index (if provided by the fluoroscopic device): 13.7 mGy Number of Acquired Spot Images: 2 PROCEDURE: Informed consent was obtained from the patient prior to the procedure, including potential complications of headache, allergy, and pain. With the patient prone, the lower back was prepped with Betadine. 1% Lidocaine was used for local anesthesia. Lumbar puncture was performed at the L3-L4 level  using a 20 gauge spinal needle with return of initially slightly cloudy then clear CSF. Opening pressure was not obtained due to difficulty with patient positioning and to expedite the exam to limit the time the patient was needing to be prone. Twelve ml of CSF were obtained for laboratory studies. The patient tolerated the procedure well and there were no apparent complications. IMPRESSION: 1. Successful fluoroscopically guided lumbar puncture for CSF acquisition. Electronically Signed   By: Lajean Manes M.D.   On: 11/21/2019 15:41   Scheduled Meds: . aspirin EC  81 mg Oral Daily  . docusate sodium  50 mg Oral BID  . gabapentin  100 mg Oral BID WC  . gabapentin  200 mg Oral QHS  . multivitamin with minerals  1 tablet Oral Daily   Continuous Infusions: . sodium chloride 75 mL/hr at 11/22/19 1022  . methylPREDNISolone (SOLU-MEDROL) injection      LOS: 3 days   Kerney Elbe, DO [facility] PAGER is on AMION  If 7PM-7AM, please contact night-coverage [email or web address]

## 2019-11-23 ENCOUNTER — Inpatient Hospital Stay (HOSPITAL_COMMUNITY): Payer: Medicare Other

## 2019-11-23 DIAGNOSIS — R609 Edema, unspecified: Secondary | ICD-10-CM

## 2019-11-23 LAB — COMPREHENSIVE METABOLIC PANEL
ALT: 43 U/L (ref 0–44)
AST: 42 U/L — ABNORMAL HIGH (ref 15–41)
Albumin: 2.3 g/dL — ABNORMAL LOW (ref 3.5–5.0)
Alkaline Phosphatase: 89 U/L (ref 38–126)
Anion gap: 10 (ref 5–15)
BUN: 39 mg/dL — ABNORMAL HIGH (ref 8–23)
CO2: 24 mmol/L (ref 22–32)
Calcium: 8.5 mg/dL — ABNORMAL LOW (ref 8.9–10.3)
Chloride: 105 mmol/L (ref 98–111)
Creatinine, Ser: 1.37 mg/dL — ABNORMAL HIGH (ref 0.44–1.00)
GFR calc Af Amer: 40 mL/min — ABNORMAL LOW (ref >60)
GFR calc non Af Amer: 35 mL/min — ABNORMAL LOW (ref >60)
Glucose, Bld: 311 mg/dL — ABNORMAL HIGH (ref 70–99)
Potassium: 5.3 mmol/L — ABNORMAL HIGH (ref 3.5–5.1)
Sodium: 139 mmol/L (ref 135–145)
Total Bilirubin: 0.4 mg/dL (ref 0.3–1.2)
Total Protein: 6.5 g/dL (ref 6.5–8.1)

## 2019-11-23 LAB — CBC WITH DIFFERENTIAL/PLATELET
Abs Immature Granulocytes: 0.01 10*3/uL (ref 0.00–0.07)
Basophils Absolute: 0 10*3/uL (ref 0.0–0.1)
Basophils Relative: 0 %
Eosinophils Absolute: 0 10*3/uL (ref 0.0–0.5)
Eosinophils Relative: 0 %
HCT: 36 % (ref 36.0–46.0)
Hemoglobin: 10.8 g/dL — ABNORMAL LOW (ref 12.0–15.0)
Immature Granulocytes: 0 %
Lymphocytes Relative: 8 %
Lymphs Abs: 0.5 10*3/uL — ABNORMAL LOW (ref 0.7–4.0)
MCH: 26.1 pg (ref 26.0–34.0)
MCHC: 30 g/dL (ref 30.0–36.0)
MCV: 87 fL (ref 80.0–100.0)
Monocytes Absolute: 0.2 10*3/uL (ref 0.1–1.0)
Monocytes Relative: 3 %
Neutro Abs: 4.9 10*3/uL (ref 1.7–7.7)
Neutrophils Relative %: 89 %
Platelets: 228 10*3/uL (ref 150–400)
RBC: 4.14 MIL/uL (ref 3.87–5.11)
RDW: 15.9 % — ABNORMAL HIGH (ref 11.5–15.5)
WBC: 5.6 10*3/uL (ref 4.0–10.5)
nRBC: 0 % (ref 0.0–0.2)

## 2019-11-23 LAB — MAGNESIUM: Magnesium: 2.3 mg/dL (ref 1.7–2.4)

## 2019-11-23 LAB — CULTURE, BLOOD (ROUTINE X 2)
Culture: NO GROWTH
Culture: NO GROWTH

## 2019-11-23 LAB — GLUCOSE, CAPILLARY
Glucose-Capillary: 288 mg/dL — ABNORMAL HIGH (ref 70–99)
Glucose-Capillary: 189 mg/dL — ABNORMAL HIGH (ref 70–99)
Glucose-Capillary: 159 mg/dL — ABNORMAL HIGH (ref 70–99)
Glucose-Capillary: 173 mg/dL — ABNORMAL HIGH (ref 70–99)

## 2019-11-23 LAB — PHOSPHORUS: Phosphorus: 2.8 mg/dL (ref 2.5–4.6)

## 2019-11-23 LAB — HEMOGLOBIN A1C
Hgb A1c MFr Bld: 5.8 % — ABNORMAL HIGH (ref 4.8–5.6)
Mean Plasma Glucose: 119.76 mg/dL

## 2019-11-23 MED ORDER — SODIUM CHLORIDE 0.9 % IV SOLN: INTRAVENOUS | Status: DC

## 2019-11-23 MED ORDER — SODIUM ZIRCONIUM CYCLOSILICATE 10 G PO PACK
10.0000 g | PACK | Freq: Once | ORAL | Status: AC
  Administered 2019-11-23: 15:00:00 10 g via ORAL
  Filled 2019-11-23: qty 1

## 2019-11-23 MED ORDER — INSULIN ASPART 100 UNIT/ML ~~LOC~~ SOLN: 0.0000 [IU] | Freq: Every day | SUBCUTANEOUS | Status: DC

## 2019-11-23 MED ORDER — PANTOPRAZOLE SODIUM 40 MG PO TBEC
40.0000 mg | DELAYED_RELEASE_TABLET | Freq: Every day | ORAL | Status: DC
  Administered 2019-11-23 – 2019-11-29 (×7): 40 mg via ORAL
  Filled 2019-11-23 (×7): qty 1

## 2019-11-23 MED ORDER — INSULIN ASPART 100 UNIT/ML ~~LOC~~ SOLN
0.0000 [IU] | Freq: Three times a day (TID) | SUBCUTANEOUS | Status: DC
  Administered 2019-11-23 (×2): 3 [IU] via SUBCUTANEOUS
  Administered 2019-11-24: 11 [IU] via SUBCUTANEOUS
  Administered 2019-11-24 (×2): 3 [IU] via SUBCUTANEOUS
  Administered 2019-11-25 (×2): 2 [IU] via SUBCUTANEOUS
  Administered 2019-11-25: 5 [IU] via SUBCUTANEOUS
  Administered 2019-11-26 (×3): 3 [IU] via SUBCUTANEOUS
  Administered 2019-11-27: 2 [IU] via SUBCUTANEOUS
  Administered 2019-11-27: 3 [IU] via SUBCUTANEOUS
  Administered 2019-11-28: 2 [IU] via SUBCUTANEOUS

## 2019-11-23 NOTE — Plan of Care (Signed)
Continue 5 days of IV methylprednisolone No need for taper. Follow up with [facility] in 4-6 weeks after d/c  D/w Dr Alfredia Ferguson over the phone. Call neurology with q.   -- Amie Portland, MD Neurology

## 2019-11-23 NOTE — Progress Notes (Signed)
Right upper extremity venous duplex completed. Refer to "CV Proc" under chart review to view preliminary results.  11/23/2019 3:46 PM Kelby Aline., MHA, RVT, RDCS, RDMS

## 2019-11-23 NOTE — Progress Notes (Signed)
PROGRESS NOTE    BUFFY EHLER  PQZ:[id number] DOB: 08-26-1929 DOA: 11/18/2019 PCP: Kerin Perna, NP   Brief Narrative:  The patient is an 86 year old African-American female with past medical history significant for but not limited to history of CKD, morbid obesity, hypertension, bilateral lower extremity leg edema as well as osteoarthritis who presented to [facility] via EMS for evaluation of AMS which has been going on for last 2 days.  Family was worried about a UTI as UTIs caused her AMS in the past.  She is noted to have bilateral lower extremity edema in the ED and her lactate was elevated.  Her urinalysis however was negative as well as her chest x-ray.  A CT of the head was obtained revealing a new areas of hyperintense T2 weighted signal within the right occipital and right anterior temporal white matter.  Imaging was recommended with IV contrast with MRI and this was done.  Neurology was consulted for further evaluation recommendations and they initially recommended repeating the MRI of the brain with contrast.  I spoke with Dr. Amie Portland who is going to be discussing the case with some of his colleagues.  Repeat MRI showed  "Findings consistent with either amyloid beta related angiitis or inflammatory cerebral amyloid angiopathy. No evidence of metastatic disease. I doubt the diagnoses of atypical PRES, prior ischemic infarction, prior trauma or PML, which were considered." Her lactic acidosis has improved and she is much more awake and alert today and she was reportedly.  SLP came to evaluate the patient and they are recommending a regular diet with thin liquids.  Patient's renal function is improving however her CK was elevated on admission.  We will continue IV fluid hydration with normal saline at 75 mL's per hour for next 12 hours and obtain a PT and OT consultation.  **Interim History IV fluids have now stopped and there is concern for cerebral amyloid angiopathy related  inflammation based on her imaging.  Neurology wants to rule out other sources and causes such as viral infection or autoimmune diseases and have recommended an LP but patient and family was undecided and likely did not want to pursue it however have now decided they want to get it done. Patient was evaluated by PT and OT and they are recommending SNF however patient and family want to talk to Social Work about placement. Have called IR to arrange for LP to be Done Under Fluoro   11/22/2019: She underwent fluoroscopy guided LP yesterday and the LP studies have been reviewed and neurology does not feel that she has an acute infection so the recommend starting steroids.  She was started on 1 g of Solu-Medrol for 5 days.  This morning when I went to see her she was more confused and unable to move her right arm so we will obtain a stat head MRI which is still pending.  Neurology was notified and they will see the patient later today.  11/23/2019: She got the first dose of IV Solu-Medrol yesterday and she is improving today.  She has more motion on her hand and right upper extremity duplex is still pending to be done but x-ray showed likely calcific tendinopathy.  Assessment & Plan:   Principal Problem:   Acute encephalopathy Active Problems:   Hypertension  Acute Encephalopathy with abnormal MRI brain, improving  -Neurology was consulted and they recommended MRI of the brain with contrast.  -Patient had some difficulty swallowing get speech therapist evaluation evaluated and recommending  a regular diet with thin liquids.  -In addition we will check ammonia TSH B12; ammonia was elevated at 50 and was within normal limits at 1.775 -Patient was found to have an elevated CK and was continued on IV fluid hydration gently with normal saline at 75 MLS per hour and had initially stopped but will be resumed.  CK is now trending down from 1074 now 690 -MRI of the brain with contrast showed "Findings consistent  with either amyloid beta related angiitis or inflammatory cerebral amyloid angiopathy. No evidence of metastatic disease. I doubt the diagnoses of atypical  PRES, prior ischemic infarction, prior trauma or PML, which were considered." -Dr. Rory Percy evaluated and reviewed her imaging and felt that there is no enhancement seen. Per his note: "Vasogenic edema in the occipital cortex and temporal cortex unilaterally with susceptibility weighted images showing multiple foci of hemosiderin deposit peripherally and an amyloid angiopathy pattern.";  He feels that the main considerations are for amyloid beta related angiitis or inflammatory cerebral amyloid angiopathy/cerebral amyloid angiopathy related inflammation is at the top of the differential -Patient underwent fluoroscopy guided LP yesterday -Dr. Rory Percy does not have a high suspicion for bacterial infection but feel that a viral process or autoimmune inflammatory process could be presenting like this but he feels that imaging more feels like Cerebral Amyloid Angiopathy Related Inflammation/Amyloid Beta Related Angiitis -PT OT evaluated and recommending SNF however patient and daughter are hesitant about going to SNF currently and wants to speak with Social Work -LP done and showed 79 glucose, 49 RBCs, 0 WBCs, too few to count other cells, 80 total protein, with CSF culture still pending -Neurology recommends starting the patient on high-dose Solu-Medrol 1 g daily for 5 days and today is day 2 out of 5; we will add Protonix for GI prophylaxis -She also had some worsening mental status on the morning of 11/22/19 and was more confused and unable to lift her right arm and it was very weak compared to her left arm so we will obtain a stat another stat head MRI to evaluate for CVA; Neurology was notified and they do not recommend calling a code stroke currently and they will see the patient later on today.  Neurology feels that she is having "amyloid spells" -Repeat  MRI showed "Unchanged MRI appearance of the brain since 11/18/2019, with areas of vasogenic edema in the right parietal lobe and left temporal lobe most compatible with amyloid related inflammation such as ABRA (amyloid beta related angitis). No new intracranial abnormality." -Patient has more motion in her arm today and the x-ray of her elbow showed no acute osseous abnormality and a thin curvilinear soft tissue calcification adjacent to the right olecranon process and lateral epicondyle of the distal right humerus which likely represents calcific tendinopathy -Upper extremity duplex of the left arm is still pending -The patient's mentation is improving and she will need to follow-up with Romie Minus a neurology in the outpatient setting after she is completed hospitalization and 5 days of IV Solu-Medrol.  Hypertension  -She is on IV hydralazine as needed now every 4 hours for systolic blood pressure greater than 956 or diastolic blood pressure greater than 110 -We resumed her home losartan 50 mg p.o. daily yesterday we will stop it again today given her AKI -Patient blood pressure is 130/66  Lower Extremity Edema  -On diuretics as an outpatient but we are currently holding these start IV fluid hydration given her slightly worsened renal function -Continue to monitor carefully  Right Arm Pain and Weakness -Is more swollen compared to the left -We will check approximately duplex to rule out DVT is still pending to be done -X-ray done as above showed no acute osseous abnormality and a thin curvilinear soft tissue calcification adjacent to the right olecranon process and lateral epicondyle of the distal right humerus which likely represents calcific tendinopathy -Neurology evaluated and does not feel that it is secondary to any neurological deficit  AKI on chronic Kidney Disease Stage III Metabolic acidosis, improved -Creatinine appearrf to be at baseline and had improved but slightly worsened again    -She did have a mild metabolic acidosis with a CO2 of 20, and anion gap of 10, and chloride level of 112; now CO2 is 24, anion gap is 10, and chloride level is 105 -Given Gentle IVF Hydration with NS was stopped yesterday but now has been restarted at 75 MLS per hour and will continue for now -BUN/Cr is trending down and went from 21/1.32 on admission and had trended down to 17/0.95 but is now slightly worsened again to 29/1.27 yesterday and today it is 39/1.37 -Avoid nephrotoxic medications, contrast dyes, hypotension and renally adjust medications -Continue to monitor and trend renal function -Repeat CMP in a.m.  Hypophosphatemia -Patient's phosphorus level was 2.2 yesterday morning and today is 2.8 -Continue to monitor and replete as necessary -Repeat phosphorus level in the a.m.  Normocytic Anemia  -Appears to be chronic.  -Follow CBC.  -Hemoglobin/hematocrit went from 10.1/34.0 -> 10.0/32.8 -> 9.2/30.7 -> 10.8/36.0 -Will check anemia panel in the AM. -Continue to monitor for signs and symptoms of bleeding; currently no overt bleeding noted -Repeat CBC in a.m.  Hyperglycemia in the setting of Prediabetes -Worsened in the setting of IV steroid demargination -Patient blood sugar on this morning's CMP was 311 -From 104-288 now -Have placed the patient on moderate NovoLog/scale insulin before meals and at bedtime  Hypokalemia -Patient is potassium was 3.4 and now improved to 4.7 -Continue to monitor and replete as necessary -Repeat CMP in the a.m.  Leg pain, improving -Patient is complaining of some left-sided leg pain -Continue with gabapentin and have added tramadol -If continues to worsen will likely pursue further imaging but currently it is stable -Continue with PT and OT efforts and reposition the patient  Obesity -Estimated body mass index is 32.58 kg/m as calculated from the following:   Height as of this encounter: 4\' 10"  (1.473 m).   Weight as of this  encounter: 70.7 kg. -Weight Loss and Dietary Counseling given   DVT prophylaxis: SCDs in case she decides for LP; currently on hold pharmacological prophylaxis for now until appears over with Code Status: FULL CODE  Family Communication: Discussed with Daughter at bedside  Disposition Plan: Patient is from home and PT OT evaluated and recommending skilled nursing facility however patient's family and patient need to decide about going to SNF.  She will need to remain hospitalized for IV Solu-Medrol daily  Status is: Inpatient  Remains inpatient appropriate because:Ongoing diagnostic testing needed not appropriate for outpatient work up and Unsafe d/c plan   Dispo: The patient is from: Home              Anticipated d/c is to: SNF              Anticipated d/c date is: > 3 days due to Daily Steroids              Patient currently is not medically stable to d/c.  Consultants:   Neurology   Procedures:  MRI's  Lumbar Puncture  Antimicrobials: Anti-infectives (From admission, onward)   Start     Dose/Rate Route Frequency Ordered Stop   11/18/19 1530  cefTRIAXone (ROCEPHIN) 1 g in sodium chloride 0.9 % 100 mL IVPB  Status:  Discontinued     1 g 200 mL/hr over 30 Minutes Intravenous Every 24 hours 11/18/19 1526 11/19/19 0429     Subjective: Seen and examined at bedside she is much more awake and alert today than she was yesterday.  She is able to move her right arm better today as well.  Had several questions about her diagnosis.  No nausea or vomiting.  States the pain is okay.  No other concerns or claims at this time.  Objective: Vitals:   11/22/19 1341 11/22/19 2227 11/23/19 0607 11/23/19 1347  BP: (!) 154/65 (!) 144/74 (!) 151/80 130/66  Pulse: (!) 101 84 90 80  Resp: 20 20 18 16   Temp: 100.1 F (37.8 C) 98 F (36.7 C) 98 F (36.7 C) (!) 97.5 F (36.4 C)  TempSrc: Oral Oral Oral Oral  SpO2: 100% 100% 100% 100%  Weight:      Height:        Intake/Output Summary  (Last 24 hours) at 11/23/2019 1455 Last data filed at 11/23/2019 4097 Gross per 24 hour  Intake 480 ml  Output 350 ml  Net 130 ml   Filed Weights   11/19/19 0200  Weight: 70.7 kg   Examination: Physical Exam:  Constitutional: WN/WD elderly African-American female currently who is not as confused as she was yesterday and is able to raise her right arm more than she did yesterday.  She is improved with the steroids and appears calm but slightly uncomfortable. Eyes: She is blind in left eye ENMT: External Ears, Nose appear normal. Grossly normal hearing.  Neck: Appears normal, supple, no cervical masses, normal ROM, no appreciable thyromegaly; no JVD Respiratory: Diminished to auscultation bilaterally, no wheezing, rales, rhonchi or crackles. Normal respiratory effort and patient is not tachypenic. No accessory muscle use.  Cardiovascular: RRR, no murmurs / rubs / gallops. S1 and S2 auscultated.  Has some mild 1+ lower extremity and pedal edema Abdomen: Soft, non-tender, distended secondary to body habitus.. Bowel sounds positive.  GU: Deferred. Musculoskeletal: No clubbing / cyanosis of digits/nails. No joint deformity upper and lower extremities and she has better strength and mobility in her right arm today than she did yesterday Skin: No rashes, lesions, ulcers on limited skin evaluation. No induration; Warm and dry.  Neurologic: CN 2-12 grossly intact with no focal deficits. Romberg sign and cerebellar reflexes not assessed.  Psychiatric: Normal judgment and insight. Alert and oriented x 3. Normal mood and appropriate affect.   Data Reviewed: I have personally reviewed following labs and imaging studies  CBC: Recent Labs  Lab 11/19/19 0442 11/20/19 0528 11/21/19 0424 11/22/19 0432 11/23/19 0534  WBC 6.5 5.4 6.3 5.8 5.6  NEUTROABS 4.8 4.0 4.1 3.8 4.9  HGB 10.8* 10.1* 10.0* 9.2* 10.8*  HCT 35.3* 34.0* 32.8* 30.7* 36.0  MCV 84.2 86.7 84.3 85.5 87.0  PLT 177 176 182 181 353    Basic Metabolic Panel: Recent Labs  Lab 11/19/19 0442 11/20/19 0528 11/21/19 0424 11/22/19 0432 11/23/19 0534  NA 143 142 141 142 139  K 4.3 3.7 3.4* 4.7 5.3*  CL 110 112* 109 109 105  CO2 23 20* 25 24 24   GLUCOSE 118* 112* 115* 144* 311*  BUN 18  17 17 29* 39*  CREATININE 1.06* 0.96 0.95 1.27* 1.37*  CALCIUM 8.5* 8.1* 8.0* 8.3* 8.5*  MG  --  2.0 2.0 2.2 2.3  PHOS  --  2.4* 2.6 2.2* 2.8   GFR: Estimated Creatinine Clearance: 24.1 mL/min (A) (by C-G formula based on SCr of 1.37 mg/dL (H)). Liver Function Tests: Recent Labs  Lab 11/19/19 0442 11/20/19 0528 11/21/19 0424 11/22/19 0432 11/23/19 0534  AST 48* 37 26 36 42*  ALT 22 20 20 26  43  ALKPHOS 69 57 54 58 89  BILITOT 1.3* 1.0 0.9 0.7 0.4  PROT 6.3* 5.7* 5.4* 5.5* 6.5  ALBUMIN 2.9* 2.5* 2.3* 2.2* 2.3*   No results for input(s): LIPASE, AMYLASE in the last 168 hours. Recent Labs  Lab 11/19/19 0530  AMMONIA 50*   Coagulation Profile: Recent Labs  Lab 11/18/19 1603  INR 1.1   Cardiac Enzymes: Recent Labs  Lab 11/19/19 0442 11/20/19 0528  CKTOTAL 1,074* 690*   BNP (last 3 results) No results for input(s): PROBNP in the last 8760 hours. HbA1C: Recent Labs    11/23/19 0524  HGBA1C 5.8*   CBG: Recent Labs  Lab 11/22/19 0231 11/22/19 1020 11/22/19 1848 11/23/19 0149 11/23/19 1215  GLUCAP 153* 104* 214* 288* 189*   Lipid Profile: No results for input(s): CHOL, HDL, LDLCALC, TRIG, CHOLHDL, LDLDIRECT in the last 72 hours. Thyroid Function Tests: No results for input(s): TSH, T4TOTAL, FREET4, T3FREE, THYROIDAB in the last 72 hours. Anemia Panel: No results for input(s): VITAMINB12, FOLATE, FERRITIN, TIBC, IRON, RETICCTPCT in the last 72 hours. Sepsis Labs: Recent Labs  Lab 11/18/19 1405 11/18/19 1603 11/19/19 0530 11/19/19 0716  LATICACIDVEN 2.9* 2.9* 1.3 1.5    Recent Results (from the past 240 hour(s))  Blood culture (routine x 2)     Status: None   Collection Time: 11/18/19  1:48  PM   Specimen: BLOOD  Result Value Ref Range Status   Specimen Description   Final    BLOOD LEFT ANTECUBITAL Performed at [facility], [place] [address] [place], [place] [address]    Special Requests   Final    BOTTLES DRAWN AEROBIC AND ANAEROBIC Blood Culture results may not be optimal due to an excessive volume of blood received in culture bottles Performed at [facility] [address], [place], [place] [address]    Culture   Final    NO GROWTH 5 DAYS Performed at [facility], [place] [address] [place], [place] [address]    Report Status 11/23/2019 FINAL  Final  Blood culture (routine x 2)     Status: None   Collection Time: 11/18/19  2:05 PM   Specimen: BLOOD  Result Value Ref Range Status   Specimen Description   Final    BLOOD RIGHT ANTECUBITAL Performed at [facility] [address] [place], [place] [address]    Special Requests   Final    BOTTLES DRAWN AEROBIC ONLY Blood Culture results may not be optimal due to an inadequate volume of blood received in culture bottles Performed at [facility] [address] [place], [place] [address]    Culture   Final    NO GROWTH 5 DAYS Performed at [facility], [place] [address] [place], [place] [address]    Report Status 11/23/2019 FINAL  Final  Urine culture     Status: Abnormal   Collection Time: 11/18/19  2:31 PM   Specimen: In/Out Cath Urine  Result Value Ref Range Status   Specimen Description   Final    IN/OUT CATH URINE Performed at [facility] [address], [place], [place] [address]    Special Requests   Final    NONE Performed at [facility], [place] [address], [place], [place] [address]    Culture (A)  Final    5,000 COLONIES/mL GROUP B STREP(S.AGALACTIAE)ISOLATED TESTING AGAINST S. AGALACTIAE NOT ROUTINELY PERFORMED DUE TO PREDICTABILITY OF AMP/PEN/VAN SUSCEPTIBILITY. Performed at  [facility], [place] [address] [place], [place] [address]    Report Status 11/20/2019 FINAL  Final  SARS CORONAVIRUS 2 (TAT 6-24 HRS) Nasopharyngeal Nasopharyngeal Swab     Status: None   Collection Time: 11/18/19 11:09 PM   Specimen: Nasopharyngeal Swab  Result Value Ref Range Status   SARS Coronavirus 2 NEGATIVE NEGATIVE Final    Comment: (NOTE) SARS-CoV-2 target nucleic acids are NOT DETECTED. The SARS-CoV-2 RNA is generally detectable in upper and lower respiratory specimens during the acute phase of infection. Negative results do not preclude SARS-CoV-2 infection, do not rule out co-infections with other pathogens, and should not be used as the sole basis for treatment or other patient management decisions. Negative results must be combined with clinical observations, patient history, and epidemiological information. The expected result is Negative. Fact Sheet for Patients: SugarRoll.be Fact Sheet for Healthcare Providers: https://www.woods-mathews.com/ This test is not yet approved or cleared by the [place] FDA and  has been authorized for detection and/or diagnosis of SARS-CoV-2 by FDA under an Emergency Use Authorization (EUA). This EUA will remain  in effect (meaning this test can be used) for the duration of the COVID-19 declaration under Section 56 4(b)(1) of the Act, 21 U.S.C. section 360bbb-3(b)(1), unless the authorization is terminated or revoked sooner. Performed at [facility], [place] [address], [place], [place] [address]   CSF culture     Status: None (Preliminary result)   Collection Time: 11/21/19  3:03 PM   Specimen: PATH Cytology CSF; Cerebrospinal Fluid  Result Value Ref Range Status   Specimen Description   Final    CSF Performed at [facility] [address], [place], [place] [address]    Special Requests   Final    NONE Performed at [facility], [place]  [address] [place], [place] [address]    Gram Stain   Final    NO WBC SEEN NO ORGANISMS SEEN CYTOSPIN SMEAR Gram Stain Report Called to,Read Back By and Verified With: S.ADAMSON AT 1805 ON 11/21/19 BY N.THOMPSON Performed at [facility], [place] [address] [place], [place] [address]    Culture   Final    NO GROWTH 2 DAYS Performed at [facility] [address] [place], [place] [address]    Report Status PENDING  Incomplete     RN Pressure Injury Documentation:     Estimated body mass index is 32.58 kg/m as calculated from the following:   Height as of this encounter: 4\' 10"  (1.473 m).   Weight as of this encounter: 70.7 kg.  Malnutrition Type:      Malnutrition Characteristics:      Nutrition Interventions:    Radiology Studies: DG Elbow 2 Views Right  Result Date: 11/22/2019 CLINICAL DATA:  Atraumatic right elbow pain. EXAM: RIGHT ELBOW - 2 VIEW COMPARISON:  None. FINDINGS: There is no evidence of acute fracture, dislocation, or joint effusion. There is no evidence of arthropathy  or other focal bone abnormality. Thin curvilinear soft tissue calcification is seen adjacent to the right olecranon process and lateral epicondyle of the distal right humerus. IMPRESSION: 1. No acute osseous abnormality. 2. Thin curvilinear soft tissue calcification adjacent to the right olecranon process and lateral epicondyle of the distal right humerus. This may represent calcific tendinopathy. Electronically Signed   By: Virgina Norfolk M.D.   On: 11/22/2019 19:45   MR BRAIN WO CONTRAST  Result Date: 11/22/2019 CLINICAL DATA:  86 year old female with recent altered mental status, cephalopathy. Constellation of findings on recent MRIs most compatible with inflammatory disease related to amyloid angiopathy (e.g. amyloid beta related angiitis). EXAM: MRI HEAD WITHOUT CONTRAST TECHNIQUE: Multiplanar, multiecho pulse sequences of the brain and surrounding structures were  obtained without intravenous contrast. COMPARISON:  Brain MRI with contrast 11/19/2019 and without contrast 11/18/2019. FINDINGS: Brain: Continued confluent T2 and FLAIR hyperintensity in the posterior right parietal lobe, lateral right temporal lobe in a vasogenic edema pattern. No change compared to 11/18/2019. Diffusion weighted imaging remains negative. T2* imaging rather than SWI performed today, but again demonstrates evidence of punctate microhemorrhages with the relatively subcortical distribution in the affected areas and also the contralateral left parietal lobe. Stable gray and white matter signal elsewhere. Possible chronic encephalomalacia in the dorsal cerebellum, not significantly changed from a 2014 MRI. No new signal abnormality identified. No restricted diffusion to suggest acute infarction. No midline shift, mass effect, ventriculomegaly, extra-axial collection or acute intracranial hemorrhage. Cervicomedullary junction and pituitary are within normal limits. Vascular: Major intracranial vascular flow voids appear stable. Skull and upper cervical spine: Widespread advanced cervical spine degeneration appears stable from the recent MRI. Visualized bone marrow signal is within normal limits. Sinuses/Orbits: Stable and negative. Other: Mastoids remain clear. IMPRESSION: 1. Unchanged MRI appearance of the brain since 11/18/2019, with areas of vasogenic edema in the right parietal lobe and left temporal lobe most compatible with amyloid related inflammation such as ABRA (amyloid beta related angitis). 2. No new intracranial abnormality. Electronically Signed   By: Genevie Ann M.D.   On: 11/22/2019 14:02   DG FLUORO GUIDE LUMBAR PUNCTURE  Result Date: 11/21/2019 CLINICAL DATA:  Encephalopathy. Patient presents for fluoroscopically guided lumbar puncture for CSF acquisition. EXAM: DIAGNOSTIC LUMBAR PUNCTURE UNDER FLUOROSCOPIC GUIDANCE FLUOROSCOPY TIME:  Fluoroscopy Time:  40 seconds Radiation Exposure  Index (if provided by the fluoroscopic device): 13.7 mGy Number of Acquired Spot Images: 2 PROCEDURE: Informed consent was obtained from the patient prior to the procedure, including potential complications of headache, allergy, and pain. With the patient prone, the lower back was prepped with Betadine. 1% Lidocaine was used for local anesthesia. Lumbar puncture was performed at the L3-L4 level using a 20 gauge spinal needle with return of initially slightly cloudy then clear CSF. Opening pressure was not obtained due to difficulty with patient positioning and to expedite the exam to limit the time the patient was needing to be prone. Twelve ml of CSF were obtained for laboratory studies. The patient tolerated the procedure well and there were no apparent complications. IMPRESSION: 1. Successful fluoroscopically guided lumbar puncture for CSF acquisition. Electronically Signed   By: Lajean Manes M.D.   On: 11/21/2019 15:41   Scheduled Meds: . aspirin EC  81 mg Oral Daily  . docusate sodium  50 mg Oral BID  . gabapentin  100 mg Oral BID WC  . gabapentin  200 mg Oral QHS  . heparin injection (subcutaneous)  5,000 Units Subcutaneous Q8H  . insulin aspart  0-15 Units Subcutaneous TID WC  . insulin aspart  0-5 Units Subcutaneous QHS  . multivitamin with minerals  1 tablet Oral Daily  . sodium zirconium cyclosilicate  10 g Oral Once   Continuous Infusions: . sodium chloride 75 mL/hr at 11/23/19 1232  . methylPREDNISolone (SOLU-MEDROL) injection 1,000 mg (11/23/19 0900)    LOS: 4 days   Kerney Elbe, DO [facility] PAGER is on [place]  If 7PM-7AM, please contact night-coverage [email or web address]

## 2019-11-24 LAB — CBC WITH DIFFERENTIAL/PLATELET
Abs Immature Granulocytes: 0.03 10*3/uL (ref 0.00–0.07)
Basophils Absolute: 0 10*3/uL (ref 0.0–0.1)
Basophils Relative: 0 %
Eosinophils Absolute: 0 10*3/uL (ref 0.0–0.5)
Eosinophils Relative: 0 %
HCT: 32.9 % — ABNORMAL LOW (ref 36.0–46.0)
Hemoglobin: 10.1 g/dL — ABNORMAL LOW (ref 12.0–15.0)
Immature Granulocytes: 0 %
Lymphocytes Relative: 10 %
Lymphs Abs: 0.7 10*3/uL (ref 0.7–4.0)
MCH: 25.7 pg — ABNORMAL LOW (ref 26.0–34.0)
MCHC: 30.7 g/dL (ref 30.0–36.0)
MCV: 83.7 fL (ref 80.0–100.0)
Monocytes Absolute: 0.3 10*3/uL (ref 0.1–1.0)
Monocytes Relative: 5 %
Neutro Abs: 6.1 10*3/uL (ref 1.7–7.7)
Neutrophils Relative %: 85 %
Platelets: 257 10*3/uL (ref 150–400)
RBC: 3.93 MIL/uL (ref 3.87–5.11)
RDW: 15.4 % (ref 11.5–15.5)
WBC: 7.1 10*3/uL (ref 4.0–10.5)
nRBC: 0.3 % — ABNORMAL HIGH (ref 0.0–0.2)

## 2019-11-24 LAB — COMPREHENSIVE METABOLIC PANEL
ALT: 70 U/L — ABNORMAL HIGH (ref 0–44)
AST: 74 U/L — ABNORMAL HIGH (ref 15–41)
Albumin: 2.2 g/dL — ABNORMAL LOW (ref 3.5–5.0)
Alkaline Phosphatase: 76 U/L (ref 38–126)
Anion gap: 8 (ref 5–15)
BUN: 45 mg/dL — ABNORMAL HIGH (ref 8–23)
CO2: 25 mmol/L (ref 22–32)
Calcium: 8.5 mg/dL — ABNORMAL LOW (ref 8.9–10.3)
Chloride: 106 mmol/L (ref 98–111)
Creatinine, Ser: 1.28 mg/dL — ABNORMAL HIGH (ref 0.44–1.00)
GFR calc Af Amer: 44 mL/min — ABNORMAL LOW (ref >60)
GFR calc non Af Amer: 38 mL/min — ABNORMAL LOW (ref >60)
Glucose, Bld: 152 mg/dL — ABNORMAL HIGH (ref 70–99)
Potassium: 5.3 mmol/L — ABNORMAL HIGH (ref 3.5–5.1)
Sodium: 139 mmol/L (ref 135–145)
Total Bilirubin: 0.5 mg/dL (ref 0.3–1.2)
Total Protein: 6.3 g/dL — ABNORMAL LOW (ref 6.5–8.1)

## 2019-11-24 LAB — IGG CSF INDEX
Albumin CSF-mCnc: 30 mg/dL (ref 11–48)
Albumin: 3 g/dL — ABNORMAL LOW (ref 3.6–4.6)
CSF IgG Index: 0.8 — ABNORMAL HIGH (ref 0.0–0.7)
IgG (Immunoglobin G), Serum: 1136 mg/dL (ref 586–1602)
IgG, CSF: 8.9 mg/dL — ABNORMAL HIGH (ref 0.0–8.6)
IgG/Alb Ratio, CSF: 0.3 — ABNORMAL HIGH (ref 0.00–0.25)

## 2019-11-24 LAB — PHOSPHORUS: Phosphorus: 2.7 mg/dL (ref 2.5–4.6)

## 2019-11-24 LAB — GLUCOSE, CAPILLARY
Glucose-Capillary: 158 mg/dL — ABNORMAL HIGH (ref 70–99)
Glucose-Capillary: 183 mg/dL — ABNORMAL HIGH (ref 70–99)
Glucose-Capillary: 331 mg/dL — ABNORMAL HIGH (ref 70–99)
Glucose-Capillary: 178 mg/dL — ABNORMAL HIGH (ref 70–99)

## 2019-11-24 LAB — RESPIRATORY PANEL BY RT PCR (FLU A&B, COVID)
Influenza A by PCR: NEGATIVE
Influenza B by PCR: NEGATIVE
SARS Coronavirus 2 by RT PCR: NEGATIVE

## 2019-11-24 LAB — MAGNESIUM: Magnesium: 2.5 mg/dL — ABNORMAL HIGH (ref 1.7–2.4)

## 2019-11-24 MED ORDER — SODIUM CHLORIDE 0.9 % IV SOLN: INTRAVENOUS | Status: AC

## 2019-11-24 MED ORDER — SODIUM ZIRCONIUM CYCLOSILICATE 10 G PO PACK
10.0000 g | PACK | Freq: Once | ORAL | Status: AC
  Administered 2019-11-24: 11:00:00 10 g via ORAL
  Filled 2019-11-24: qty 1

## 2019-11-24 NOTE — Progress Notes (Signed)
PROGRESS NOTE    [name]  JJK:[id number] DOB: [birth year] DOA: 11/18/2019 PCP: Kerin Perna, NP   Brief Narrative:  The patient is an 86 year old African-American female with past medical history significant for but not limited to history of CKD, morbid obesity, hypertension, bilateral lower extremity leg edema as well as osteoarthritis who presented to [facility] via EMS for evaluation of AMS which has been going on for last 2 days.  Family was worried about a UTI as UTIs caused her AMS in the past.  She is noted to have bilateral lower extremity edema in the ED and her lactate was elevated.  Her urinalysis however was negative as well as her chest x-ray.  A CT of the head was obtained revealing a new areas of hyperintense T2 weighted signal within the right occipital and right anterior temporal white matter.  Imaging was recommended with IV contrast with MRI and this was done.  Neurology was consulted for further evaluation recommendations and they initially recommended repeating the MRI of the brain with contrast.  I spoke with Dr. Amie Portland who is going to be discussing the case with some of his colleagues.  Repeat MRI showed  "Findings consistent with either amyloid beta related angiitis or inflammatory cerebral amyloid angiopathy. No evidence of metastatic disease. I doubt the diagnoses of atypical PRES, prior ischemic infarction, prior trauma or PML, which were considered." Her lactic acidosis has improved and she is much more awake and alert today and she was reportedly.  SLP came to evaluate the patient and they are recommending a regular diet with thin liquids.  Patient's renal function is improving however her CK was elevated on admission.  We will continue IV fluid hydration with normal saline at 75 mL's per hour for next 12 hours and obtain a PT and OT consultation.  **Interim History IV fluids have now stopped and there is concern for cerebral amyloid angiopathy related  inflammation based on her imaging.  Neurology wants to rule out other sources and causes such as viral infection or autoimmune diseases and have recommended an LP but patient and family was undecided and likely did not want to pursue it however have now decided they want to get it done. Patient was evaluated by PT and OT and they are recommending SNF however patient and family want to talk to Social Work about placement. Have called IR to arrange for LP to be Done Under Fluoro   11/22/2019: She underwent fluoroscopy guided LP yesterday and the LP studies have been reviewed and neurology does not feel that she has an acute infection so the recommend starting steroids.  She was started on 1 g of Solu-Medrol for 5 days.  This morning when I went to see her she was more confused and unable to move her right arm so we will obtain a stat head MRI which is still pending.  Neurology was notified and they will see the patient later today.  11/23/2019: She got the first dose of IV Solu-Medrol yesterday and she is improving today.  She has more motion on her hand and right upper extremity duplex is still pending to be done but x-ray showed likely calcific tendinopathy.  11/24/2019 She continues to improve daily.  She is able to ambulate to the bedside commode.  PT OT recommending SNF and patient's family has selected SNF for discharge.  She will be discharged after her Solu-Medrol dose for continued therapies.  Right upper extremity duplex was negative for DVT.  She will receive her third dose of Solu-Medrol tonight.  Assessment & Plan:   Principal Problem:   Acute encephalopathy Active Problems:   Hypertension  Acute Encephalopathy with abnormal MRI brain, improving  -Neurology was consulted and they recommended MRI of the brain with contrast.  -Patient had some difficulty swallowing get speech therapist evaluation evaluated and recommending a regular diet with thin liquids.  -In addition we will check ammonia  TSH B12; ammonia was elevated at 50 and was within normal limits at 1.775 -Patient was found to have an elevated CK and was continued on IV fluid hydration gently with normal saline at 75 MLS per hour and had initially stopped but will be resumed.  CK is now trending down from 1074 now 690 checked but she is back on fluids and will likely stop these later this evening. -MRI of the brain with contrast showed "Findings consistent with either amyloid beta related angiitis or inflammatory cerebral amyloid angiopathy. No evidence of metastatic disease. I doubt the diagnoses of atypical  PRES, prior ischemic infarction, prior trauma or PML, which were considered." -Dr. Rory Percy evaluated and reviewed her imaging and felt that there is no enhancement seen. Per his note: "Vasogenic edema in the occipital cortex and temporal cortex unilaterally with susceptibility weighted images showing multiple foci of hemosiderin deposit peripherally and an amyloid angiopathy pattern.";  He feels that the main considerations are for amyloid beta related angiitis or inflammatory cerebral amyloid angiopathy/cerebral amyloid angiopathy related inflammation is at the top of the differential -Patient underwent fluoroscopy guided LP yesterday -Dr. Rory Percy does not have a high suspicion for bacterial infection but feel that a viral process or autoimmune inflammatory process could be presenting like this but he feels that imaging more feels like Cerebral Amyloid Angiopathy Related Inflammation/Amyloid Beta Related Angiitis -PT OT evaluated and recommending SNF however patient and daughter are hesitant about going to SNF currently and wants to speak with Social Work -LP done and showed 79 glucose, 49 RBCs, 0 WBCs, too few to count other cells, 80 total protein, with CSF culture still pending and showed no WBC seen, no organisms seen, and no growth at 3 days -Neurology recommends starting the patient on high-dose Solu-Medrol 1 g daily for 5  days and today is day 3 out of 5; we will add Protonix for GI prophylaxis -She also had some worsening mental status on the morning of 11/22/19 and was more confused and unable to lift her right arm and it was very weak compared to her left arm so we will obtain a stat another stat head MRI to evaluate for CVA; Neurology was notified and they do not recommend calling a code stroke currently and they will see the patient later on today.  Neurology feels that she is having "amyloid spells" -Repeat MRI showed "Unchanged MRI appearance of the brain since 11/18/2019, with areas of vasogenic edema in the right parietal lobe and left temporal lobe most compatible with amyloid related inflammation such as ABRA (amyloid beta related angitis). No new intracranial abnormality." -Patient has more motion in her arm again today and the x-ray of her elbow showed no acute osseous abnormality and a thin curvilinear soft tissue calcification adjacent to the right olecranon process and lateral epicondyle of the distal right humerus which likely represents calcific tendinopathy -Upper extremity duplex of the right arm was negative for any DVT -The patient's mentation is improving and she will need to follow-up with [facility] in the outpatient setting after she is  completed hospitalization and 5 days of IV Solu-Medrol. -T OT recommending SNF and patient's family has elected to go to SNF.  She will be discharged to [facility] and will repeat a Covid test today in anticipation for discharge on either late Wednesday or early Thursday.  Hypertension  -She is on IV hydralazine as needed now every 4 hours for systolic blood pressure greater than 035 or diastolic blood pressure greater than 110 -We resumed her home losartan 50 mg p.o. daily the day beforeyesterday we will stop it again today given her AKI -Patient blood pressure is currently stable without antihypertensives and is now 117/67  Lower Extremity Edema, stable    -On diuretics as an outpatient but we are currently holding these start IV fluid hydration given her slightly worsened renal function; Will continue for 12 more hours  -Continue to monitor carefully  Right Arm Pain and Weakness -Is more swollen compared to the left -We will check Upper Extremity duplex to rule out DVT is still pending to be done and Negative -X-Ray was done showed a thin curvilinear soft tissue calcification adjacent to the right olecranon process and lateral epicondyle of the distal right humerus which likely represents calcific tendinopathy -Neurology evaluated and does not feel that it is secondary to any neurological deficit  AKI on chronic Kidney Disease Stage III Metabolic acidosis, improved -Creatinine appearrf to be at baseline and had improved but slightly worsened again  -She did have a mild metabolic acidosis with a CO2 of 20, and anion gap of 10, and chloride level of 112; now CO2 is 24, anion gap is 10, and chloride level is 105 -Given Gentle IVF Hydration with NS was stopped yesterday but now has been restarted at 75 MLS per hour and will continue for now for 12 more hours -BUN/Cr is trending down and went from 21/1.32 on admission and had trended down to 17/0.95 but had worsened to 39/1.37 and is now 45/1.28 -Avoid nephrotoxic medications, contrast dyes, hypotension and renally adjust medications -Continue to monitor and trend renal function -Repeat CMP in a.m.  Hypophosphatemia -Patient's phosphorus level was 2.2 yesterday morning and today is 2.7 -Continue to monitor and replete as necessary -Repeat phosphorus level in the a.m.  Normocytic Anemia  -Appears to be chronic.  -Follow CBC.  -Hemoglobin/hematocrit went from 10.1/34.0 -> 10.0/32.8 -> 9.2/30.7 -> 10.8/36.0 -> 10.1/32.9 -Will check anemia panel in the AM. -Continue to monitor for signs and symptoms of bleeding; currently no overt bleeding noted -Repeat CBC in a.m.  Hyperglycemia in the  setting of Prediabetes -Worsened in the setting of IV Steroid Demargination -HbA1c is now 5.8 -Patient blood sugar on day yesterday morning's CMP was 311; today it is now 152 -CBGs have been ranging from 173-288 now -Have placed the patient on moderate NovoLog/scale insulin before meals and at bedtime and may need to further adjust insulin regimen  Hypekalemia -Patient is potassium was 3.4 and now improved to 4.7 and is now 5.3 for the last 2 days -Will give a dose of Lokelma 10 mg x1 -Continue to monitor and replete as necessary -Repeat CMP in the a.m.  Leg pain, improving -Patient is complaining of some left-sided leg pain -Continue with gabapentin and have added tramadol -If continues to worsen will likely pursue further imaging but currently it is stable -Continue with PT and OT efforts and reposition the patient  Obesity -Estimated body mass index is 32.58 kg/m as calculated from the following:   Height as of this encounter:  4\' 10"  (1.473 m).   Weight as of this encounter: 70.7 kg. -Weight Loss and Dietary Counseling given   Abnormal LFTs -Likely in the setting of high-dose steroids -Patient's AST went from 36 and has now worsened and is now 75 -Patient's ALT has gone from 26 and now has worsened and is now 70 -Continue to monitor and trend carefully and if continues to worsen further is not improving significantly will consider a right upper quadrant ultrasound as well as a acute hepatitis panel but likely this is from the steroids -Continue monitor and trend hepatic function panel and repeat CMP in a.m.  DVT prophylaxis: SCDs in case she decides for LP; currently on hold pharmacological prophylaxis for now until appears over with Code Status: FULL CODE  Family Communication: Discussed with Daughter at bedside  Disposition Plan: Patient is from home and PT OT evaluated and recommending skilled nursing facility and she has a bed.She will need to remain hospitalized for IV  Solu-Medrol daily until completion   Status is: Inpatient  Remains inpatient appropriate because:Ongoing diagnostic testing needed not appropriate for outpatient work up and Unsafe d/c plan   Dispo: The patient is from: Home              Anticipated d/c is to: SNF              Anticipated d/c date is: 3 days due to Daily Steroids              Patient currently is not medically stable to d/c.  Consultants:   Neurology   Procedures:  MRI's  Lumbar Puncture  Antimicrobials: Anti-infectives (From admission, onward)   Start     Dose/Rate Route Frequency Ordered Stop   11/18/19 1530  cefTRIAXone (ROCEPHIN) 1 g in sodium chloride 0.9 % 100 mL IVPB  Status:  Discontinued     1 g 200 mL/hr over 30 Minutes Intravenous Every 24 hours 11/18/19 1526 11/19/19 0429     Subjective: Seen and examined at bedside feels that she is doing quite well today.  She was able to ambulate to the bedside commode.  No chest pain, lightheadedness or dizziness.  Thinks her pain is controlled.  No other concerns or complaints at this time and family is elected to have the patient rehab at skilled nursing facility and she will remain in the hospital until her Solu-Medrol doses are complete.  Objective: Vitals:   11/23/19 1347 11/23/19 2055 11/24/19 0518 11/24/19 1117  BP: 130/66 (!) 141/67 127/62 117/67  Pulse: 80 73 72 74  Resp: 16 19 19 16   Temp: (!) 97.5 F (36.4 C) (!) 97.5 F (36.4 C) 97.6 F (36.4 C)   TempSrc: Oral Oral Oral   SpO2: 100% 100% 98% 99%  Weight:      Height:        Intake/Output Summary (Last 24 hours) at 11/24/2019 1316 Last data filed at 11/24/2019 4818 Gross per 24 hour  Intake 1353.53 ml  Output 1200 ml  Net 153.53 ml   Filed Weights   11/19/19 0200  Weight: 70.7 kg   Examination: Physical Exam:  Constitutional: WN/WD African-American female who is currently improving and appears calm and comfortable sitting in the chair at bedside drinking her tea., NAD and appears  calm and comfortable Eyes: She is blind in the left eye ENMT: External Ears, Nose appear normal.  She does have a little bit of hearing impairment Neck: Appears normal, supple, no cervical masses, normal ROM, no appreciable  thyromegaly; no JVD Respiratory: Diminished to auscultation bilaterally, no wheezing, rales, rhonchi or crackles. Normal respiratory effort and patient is not tachypenic. No accessory muscle use.  Unlabored breathing and not wearing any supplemental oxygen via nasal cannula Cardiovascular: RRR, no murmurs / rubs / gallops. S1 and S2 auscultated.  Has some mild 1+ lower extremity and pedal edema.   Abdomen: Soft, non-tender, distended secondary to her body habitus. Bowel sounds positive.  GU: Deferred. Musculoskeletal: No clubbing / cyanosis of digits/nails. No joint deformity upper and lower extremities.  Skin: No rashes, lesions, ulcers on limited skin evaluation. No induration; Warm and dry.  Neurologic: CN 2-12 grossly intact with no focal deficits.  Romberg sign and cerebellar reflexes not assessed.  Psychiatric: Normal judgment and insight. Alert and oriented x 3. Normal mood and appropriate affect.   Data Reviewed: I have personally reviewed following labs and imaging studies  CBC: Recent Labs  Lab 11/20/19 0528 11/21/19 0424 11/22/19 0432 11/23/19 0534 11/24/19 0504  WBC 5.4 6.3 5.8 5.6 7.1  NEUTROABS 4.0 4.1 3.8 4.9 6.1  HGB 10.1* 10.0* 9.2* 10.8* 10.1*  HCT 34.0* 32.8* 30.7* 36.0 32.9*  MCV 86.7 84.3 85.5 87.0 83.7  PLT 176 182 181 228 384   Basic Metabolic Panel: Recent Labs  Lab 11/20/19 0528 11/21/19 0424 11/22/19 0432 11/23/19 0534 11/24/19 0504  NA 142 141 142 139 139  K 3.7 3.4* 4.7 5.3* 5.3*  CL 112* 109 109 105 106  CO2 20* 25 24 24 25   GLUCOSE 112* 115* 144* 311* 152*  BUN 17 17 29* 39* 45*  CREATININE 0.96 0.95 1.27* 1.37* 1.28*  CALCIUM 8.1* 8.0* 8.3* 8.5* 8.5*  MG 2.0 2.0 2.2 2.3 2.5*  PHOS 2.4* 2.6 2.2* 2.8 2.7    GFR: Estimated Creatinine Clearance: 25.8 mL/min (A) (by C-G formula based on SCr of 1.28 mg/dL (H)). Liver Function Tests: Recent Labs  Lab 11/20/19 0528 11/21/19 0424 11/22/19 0432 11/23/19 0534 11/24/19 0504  AST 37 26 36 42* 74*  ALT 20 20 26  43 70*  ALKPHOS 57 54 58 89 76  BILITOT 1.0 0.9 0.7 0.4 0.5  PROT 5.7* 5.4* 5.5* 6.5 6.3*  ALBUMIN 2.5* 2.3* 2.2* 2.3* 2.2*   No results for input(s): LIPASE, AMYLASE in the last 168 hours. Recent Labs  Lab 11/19/19 0530  AMMONIA 50*   Coagulation Profile: Recent Labs  Lab 11/18/19 1603  INR 1.1   Cardiac Enzymes: Recent Labs  Lab 11/19/19 0442 11/20/19 0528  CKTOTAL 1,074* 690*   BNP (last 3 results) No results for input(s): PROBNP in the last 8760 hours. HbA1C: Recent Labs    11/23/19 0524  HGBA1C 5.8*   CBG: Recent Labs  Lab 11/23/19 1215 11/23/19 1652 11/23/19 2058 11/24/19 0831 11/24/19 1217  GLUCAP 189* 159* 173* 158* 183*   Lipid Profile: No results for input(s): CHOL, HDL, LDLCALC, TRIG, CHOLHDL, LDLDIRECT in the last 72 hours. Thyroid Function Tests: No results for input(s): TSH, T4TOTAL, FREET4, T3FREE, THYROIDAB in the last 72 hours. Anemia Panel: No results for input(s): VITAMINB12, FOLATE, FERRITIN, TIBC, IRON, RETICCTPCT in the last 72 hours. Sepsis Labs: Recent Labs  Lab 11/18/19 1405 11/18/19 1603 11/19/19 0530 11/19/19 0716  LATICACIDVEN 2.9* 2.9* 1.3 1.5    Recent Results (from the past 240 hour(s))  Blood culture (routine x 2)     Status: None   Collection Time: 11/18/19  1:48 PM   Specimen: BLOOD  Result Value Ref Range Status   Specimen Description  Final    BLOOD LEFT ANTECUBITAL Performed at [facility] [address], [place], [place] [address]    Special Requests   Final    BOTTLES DRAWN AEROBIC AND ANAEROBIC Blood Culture results may not be optimal due to an excessive volume of blood received in culture bottles Performed at [facility] [address] [place], [place] [address]    Culture   Final    NO GROWTH 5 DAYS Performed at [facility], [place] [address] [place], [place] [address]    Report Status 11/23/2019 FINAL  Final  Blood culture (routine x 2)     Status: None   Collection Time: 11/18/19  2:05 PM   Specimen: BLOOD  Result Value Ref Range Status   Specimen Description   Final    BLOOD RIGHT ANTECUBITAL Performed at [facility] [address], [place], [place] [address]    Special Requests   Final    BOTTLES DRAWN AEROBIC ONLY Blood Culture results may not be optimal due to an inadequate volume of blood received in culture bottles Performed at [facility] [address] [place], [place] [address]    Culture   Final    NO GROWTH 5 DAYS Performed at [facility], [place] [address], [place], [place] [address]    Report Status 11/23/2019 FINAL  Final  Urine culture     Status: Abnormal   Collection Time: 11/18/19  2:31 PM   Specimen: In/Out Cath Urine  Result Value Ref Range Status   Specimen Description   Final    IN/OUT CATH URINE Performed at [facility] [address], [place], [place] [address]    Special Requests   Final    NONE Performed at [facility], [place] [address], [place], [place] [address]    Culture (A)  Final    5,000 COLONIES/mL GROUP B STREP(S.AGALACTIAE)ISOLATED TESTING AGAINST S. AGALACTIAE NOT ROUTINELY PERFORMED DUE TO PREDICTABILITY OF AMP/PEN/VAN SUSCEPTIBILITY. Performed at [facility], [place] [address], [place], [place] [address]    Report Status 11/20/2019 FINAL  Final  SARS CORONAVIRUS 2 (TAT 6-24 HRS) Nasopharyngeal Nasopharyngeal Swab     Status: None   Collection Time: 11/18/19 11:09 PM   Specimen: Nasopharyngeal Swab  Result Value Ref Range Status   SARS Coronavirus 2 NEGATIVE NEGATIVE Final    Comment: (NOTE) SARS-CoV-2 target nucleic acids are NOT  DETECTED. The SARS-CoV-2 RNA is generally detectable in upper and lower respiratory specimens during the acute phase of infection. Negative results do not preclude SARS-CoV-2 infection, do not rule out co-infections with other pathogens, and should not be used as the sole basis for treatment or other patient management decisions. Negative results must be combined with clinical observations, patient history, and epidemiological information. The expected result is Negative. Fact Sheet for Patients: SugarRoll.be Fact Sheet for Healthcare Providers: https://www.woods-mathews.com/ This test is not yet approved or cleared by the [place] FDA and  has been authorized for detection and/or diagnosis of SARS-CoV-2 by FDA under an Emergency Use Authorization (EUA). This EUA will remain  in effect (meaning this test can be used) for the duration of the COVID-19 declaration under Section 56 4(b)(1) of the Act, 21 U.S.C. section 360bbb-3(b)(1), unless the authorization is terminated or revoked sooner. Performed at [facility], [place] [address], [place],  [address]   CSF culture     Status: None (Preliminary  result)   Collection Time: 11/21/19  3:03 PM   Specimen: PATH Cytology CSF; Cerebrospinal Fluid  Result Value Ref Range Status   Specimen Description   Final    CSF Performed at [facility] [address], [place], [place] [address]    Special Requests   Final    NONE Performed at [facility], [place] [address], [place], [place] [address]    Gram Stain   Final    NO WBC SEEN NO ORGANISMS SEEN CYTOSPIN SMEAR Gram Stain Report Called to,Read Back By and Verified With: S.ADAMSON AT 1805 ON 11/21/19 BY N.THOMPSON Performed at [facility], [place] [address], [place], [place] [address]    Culture   Final    NO GROWTH 3 DAYS Performed at [facility], [place] [address]  [place], [place] [address]    Report Status PENDING  Incomplete     RN Pressure Injury Documentation:     Estimated body mass index is 32.58 kg/m as calculated from the following:   Height as of this encounter: 4\' 10"  (1.473 m).   Weight as of this encounter: 70.7 kg.  Malnutrition Type:      Malnutrition Characteristics:      Nutrition Interventions:    Radiology Studies: DG Elbow 2 Views Right  Result Date: 11/22/2019 CLINICAL DATA:  Atraumatic right elbow pain. EXAM: RIGHT ELBOW - 2 VIEW COMPARISON:  None. FINDINGS: There is no evidence of acute fracture, dislocation, or joint effusion. There is no evidence of arthropathy or other focal bone abnormality. Thin curvilinear soft tissue calcification is seen adjacent to the right olecranon process and lateral epicondyle of the distal right humerus. IMPRESSION: 1. No acute osseous abnormality. 2. Thin curvilinear soft tissue calcification adjacent to the right olecranon process and lateral epicondyle of the distal right humerus. This may represent calcific tendinopathy. Electronically Signed   By: Virgina Norfolk M.D.   On: 11/22/2019 19:45   MR BRAIN WO CONTRAST  Result Date: 11/22/2019 CLINICAL DATA:  86 year old female with recent altered mental status, cephalopathy. Constellation of findings on recent MRIs most compatible with inflammatory disease related to amyloid angiopathy (e.g. amyloid beta related angiitis). EXAM: MRI HEAD WITHOUT CONTRAST TECHNIQUE: Multiplanar, multiecho pulse sequences of the brain and surrounding structures were obtained without intravenous contrast. COMPARISON:  Brain MRI with contrast 11/19/2019 and without contrast 11/18/2019. FINDINGS: Brain: Continued confluent T2 and FLAIR hyperintensity in the posterior right parietal lobe, lateral right temporal lobe in a vasogenic edema pattern. No change compared to 11/18/2019. Diffusion weighted imaging remains negative. T2* imaging rather than SWI performed today,  but again demonstrates evidence of punctate microhemorrhages with the relatively subcortical distribution in the affected areas and also the contralateral left parietal lobe. Stable gray and white matter signal elsewhere. Possible chronic encephalomalacia in the dorsal cerebellum, not significantly changed from a 2014 MRI. No new signal abnormality identified. No restricted diffusion to suggest acute infarction. No midline shift, mass effect, ventriculomegaly, extra-axial collection or acute intracranial hemorrhage. Cervicomedullary junction and pituitary are within normal limits. Vascular: Major intracranial vascular flow voids appear stable. Skull and upper cervical spine: Widespread advanced cervical spine degeneration appears stable from the recent MRI. Visualized bone marrow signal is within normal limits. Sinuses/Orbits: Stable and negative. Other: Mastoids remain clear. IMPRESSION: 1. Unchanged MRI appearance of the brain since 11/18/2019, with areas of vasogenic edema in the right parietal lobe and left temporal lobe most compatible with amyloid related inflammation such as ABRA (  amyloid beta related angitis). 2. No new intracranial abnormality. Electronically Signed   By: Genevie Ann M.D.   On: 11/22/2019 14:02   VAS [place] UPPER EXTREMITY VENOUS DUPLEX  Result Date: 11/24/2019 UPPER VENOUS STUDY  Indications: Edema Comparison Study: No prior study Performing Technologist: Maudry Mayhew MHA, RDMS, RVT, RDCS  Examination Guidelines: A complete evaluation includes B-mode imaging, spectral Doppler, color Doppler, and power Doppler as needed of all accessible portions of each vessel. Bilateral testing is considered an integral part of a complete examination. Limited examinations for reoccurring indications may be performed as noted.  Right Findings: +----------+------------+---------+-----------+----------+-------+ RIGHT     CompressiblePhasicitySpontaneousPropertiesSummary  +----------+------------+---------+-----------+----------+-------+ IJV           Full       Yes       Yes                      +----------+------------+---------+-----------+----------+-------+ Subclavian    Full       Yes       Yes                      +----------+------------+---------+-----------+----------+-------+ Axillary      Full       Yes       Yes                      +----------+------------+---------+-----------+----------+-------+ Brachial      Full       Yes       Yes                      +----------+------------+---------+-----------+----------+-------+ Radial        Full                                          +----------+------------+---------+-----------+----------+-------+ Ulnar         Full                                          +----------+------------+---------+-----------+----------+-------+ Cephalic      Full                                          +----------+------------+---------+-----------+----------+-------+ Basilic       Full                                          +----------+------------+---------+-----------+----------+-------+  Left Findings: +----------+------------+---------+-----------+----------+-------+ LEFT      CompressiblePhasicitySpontaneousPropertiesSummary +----------+------------+---------+-----------+----------+-------+ Subclavian               Yes       Yes                      +----------+------------+---------+-----------+----------+-------+  Summary:  Right: No evidence of deep vein thrombosis in the upper extremity. No evidence of superficial vein thrombosis in the upper extremity.  Left: No evidence of thrombosis in the subclavian.  *See table(s) above for measurements and observations.  Diagnosing physician: Harold Barban MD Electronically signed by Harold Barban MD on  11/24/2019 at 8:55:10 AM.    Final    Scheduled Meds: . aspirin EC  81 mg Oral Daily  . docusate sodium  50 mg Oral  BID  . gabapentin  100 mg Oral BID WC  . gabapentin  200 mg Oral QHS  . heparin injection (subcutaneous)  5,000 Units Subcutaneous Q8H  . insulin aspart  0-15 Units Subcutaneous TID WC  . insulin aspart  0-5 Units Subcutaneous QHS  . multivitamin with minerals  1 tablet Oral Daily  . pantoprazole  40 mg Oral Daily   Continuous Infusions: . sodium chloride 75 mL/hr at 11/24/19 0158  . methylPREDNISolone (SOLU-MEDROL) injection 1,000 mg (11/24/19 1125)    LOS: 5 days   Kerney Elbe, DO [facility] PAGER is on [place]  If 7PM-7AM, please contact night-coverage [email or web address]

## 2019-11-24 NOTE — Progress Notes (Signed)
Physical Therapy Treatment Patient Details Name: [name] MRN: [id number] DOB: 1930-06-11 Today's Date: 11/24/2019    History of Present Illness 86 y.o. female with history of hypertension chronic lower extremity edema was brought to the ER after patient's daughter found the patient was confused. 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 No evidence of metastatic disease.    PT Comments    Marked improvement with mobility on today! Pt participated well and was eager to mobilize. Daughter was present during session. Will continue to follow and progress activity as tolerated. Plan is for [facility].    Follow Up Recommendations  SNF     Equipment Recommendations  (TBD at next venue)    Recommendations for Other Services       Precautions / Restrictions Precautions Precautions: Fall Restrictions Weight Bearing Restrictions: No    Mobility  Bed Mobility Overal bed mobility: Needs Assistance Bed Mobility: Supine to Sit     Supine to sit: Mod assist;+2 for physical assistance;+2 for safety/equipment;HOB elevated     General bed mobility comments: Assist for bil LEs and to raise trunk, VCs for technique. Increased time.  Transfers Overall transfer level: Needs assistance Equipment used: Rolling walker (2 wheeled) Transfers: Sit to/from Stand Sit to Stand: Min assist;+2 physical assistance;+2 safety/equipment;From elevated surface         General transfer comment: Sit to stand x 2-once from bed, once from [facility]. Assist to rise, stabilize, control descent. Cues for safety, hand placement. Stand pivot, bed to bsc, with RW.  Ambulation/Gait Ambulation/Gait assistance: Min assist;+2 physical assistance;+2 safety/equipment Gait Distance (Feet): 4 Feet Assistive device: Rolling walker (2 wheeled) Gait Pattern/deviations: Decreased step length - right;Decreased step length - left;Decreased stride  length;Trunk flexed     General Gait Details: Assist to stabilize pt and manage RW. Followed closely with recliner. Cues for safety, posture.   Stairs             Wheelchair Mobility    Modified Rankin (Stroke Patients Only)       Balance Overall balance assessment: Needs assistance;History of Falls         Standing balance support: Bilateral upper extremity supported Standing balance-Leahy Scale: Poor                              Cognition Arousal/Alertness: Awake/alert Behavior During Therapy: WFL for tasks assessed/performed Overall Cognitive Status: Within Functional Limits for tasks assessed                                        Exercises      General Comments        Pertinent Vitals/Pain Pain Assessment: No/denies pain    Home Living                      Prior Function            PT Goals (current goals can now be found in the care plan section) Progress towards PT goals: Progressing toward goals    Frequency    Min 2X/week      PT Plan Current plan remains appropriate    Co-evaluation              AM-PAC PT "6 Clicks" Mobility   Outcome Measure  Help needed turning from your back to your side while in a flat bed without using bedrails?: A Little Help needed moving from lying on your back to sitting on the side of a flat bed without using bedrails?: A Lot Help needed moving to and from a bed to a chair (including a wheelchair)?: A Lot Help needed standing up from a chair using your arms (e.g., wheelchair or bedside chair)?: A Lot Help needed to walk in hospital room?: A Lot Help needed climbing 3-5 steps with a railing? : Total 6 Click Score: 12    End of Session Equipment Utilized During Treatment: Gait belt Activity Tolerance: Patient tolerated treatment well Patient left: in chair;with call bell/phone within reach;with family/visitor present;with chair alarm set   PT Visit  Diagnosis: Difficulty in walking, not elsewhere classified (R26.2);Muscle weakness (generalized) (M62.81);Other abnormalities of gait and mobility (R26.89);History of falling (Z91.81)     Time: 8891-6945 PT Time Calculation (min) (ACUTE ONLY): 24 min  Charges:  $Gait Training: 8-22 mins $Therapeutic Activity: 8-22 mins                         Doreatha Massed, PT Acute Rehabilitation

## 2019-11-24 NOTE — TOC Progression Note (Addendum)
Transition of Care [facility]) - Progression Note    Patient Details  Name: QUINLYN TEP MRN: [id number] Date of Birth: 1929-10-09  [facility] CM/SW Contact  Ross Ludwig, [place] Phone Number: 11/24/2019, 10:35 AM  Clinical Narrative:     CSW spoke to patient's daughter Dub Mikes and she confirmed that they would like patient to go to [facility], insurance company has approved patient for 3 days, Navi reference number, [id number].  Patient wil need a new Covid test before she is able to discharge.  CSW to updated physician, [facility] stated they can accept patient tomorrow if she is medically ready for discharge.  Expected Discharge Plan: [place] Barriers to Discharge: Continued Medical Work up  Expected Discharge Plan and Services Expected Discharge Plan: [place]   Discharge Planning Services: CM Consult   Living arrangements for the past 2 months: Single Family Home                                       Social Determinants of Health (SDOH) Interventions    Readmission Risk Interventions No flowsheet data  found.

## 2019-11-24 NOTE — Care Management Important Message (Signed)
Important Message  Patient Details IM Letter given to Evette Cristal SW Case Manager to present to the Patient Name: [name] MRN: [id number] Date of Birth: 01-08-1930   Medicare Important Message Given:  Yes     Kerin Salen 11/24/2019, 11:49 AM

## 2019-11-25 LAB — IRON AND TIBC
Iron: 34 ug/dL (ref 28–170)
Saturation Ratios: 16 % (ref 10.4–31.8)
TIBC: 213 ug/dL — ABNORMAL LOW (ref 250–450)
UIBC: 179 ug/dL

## 2019-11-25 LAB — CBC WITH DIFFERENTIAL/PLATELET
Abs Immature Granulocytes: 0.07 10*3/uL (ref 0.00–0.07)
Basophils Absolute: 0 10*3/uL (ref 0.0–0.1)
Basophils Relative: 0 %
Eosinophils Absolute: 0 10*3/uL (ref 0.0–0.5)
Eosinophils Relative: 0 %
HCT: 29.1 % — ABNORMAL LOW (ref 36.0–46.0)
Hemoglobin: 8.8 g/dL — ABNORMAL LOW (ref 12.0–15.0)
Immature Granulocytes: 1 %
Lymphocytes Relative: 8 %
Lymphs Abs: 0.5 10*3/uL — ABNORMAL LOW (ref 0.7–4.0)
MCH: 25.6 pg — ABNORMAL LOW (ref 26.0–34.0)
MCHC: 30.2 g/dL (ref 30.0–36.0)
MCV: 84.6 fL (ref 80.0–100.0)
Monocytes Absolute: 0.2 10*3/uL (ref 0.1–1.0)
Monocytes Relative: 4 %
Neutro Abs: 5.2 10*3/uL (ref 1.7–7.7)
Neutrophils Relative %: 87 %
Platelets: 280 10*3/uL (ref 150–400)
RBC: 3.44 MIL/uL — ABNORMAL LOW (ref 3.87–5.11)
RDW: 15.6 % — ABNORMAL HIGH (ref 11.5–15.5)
WBC: 6 10*3/uL (ref 4.0–10.5)
nRBC: 0.7 % — ABNORMAL HIGH (ref 0.0–0.2)

## 2019-11-25 LAB — RETICULOCYTES
Immature Retic Fract: 23.5 % — ABNORMAL HIGH (ref 2.3–15.9)
RBC.: 3.44 MIL/uL — ABNORMAL LOW (ref 3.87–5.11)
Retic Count, Absolute: 40.2 10*3/uL (ref 19.0–186.0)
Retic Ct Pct: 1.2 % (ref 0.4–3.1)

## 2019-11-25 LAB — CSF CULTURE W GRAM STAIN
Culture: NO GROWTH
Gram Stain: NONE SEEN

## 2019-11-25 LAB — COMPREHENSIVE METABOLIC PANEL
ALT: 56 U/L — ABNORMAL HIGH (ref 0–44)
AST: 40 U/L (ref 15–41)
Albumin: 1.8 g/dL — ABNORMAL LOW (ref 3.5–5.0)
Alkaline Phosphatase: 72 U/L (ref 38–126)
Anion gap: 6 (ref 5–15)
BUN: 44 mg/dL — ABNORMAL HIGH (ref 8–23)
CO2: 23 mmol/L (ref 22–32)
Calcium: 7.8 mg/dL — ABNORMAL LOW (ref 8.9–10.3)
Chloride: 107 mmol/L (ref 98–111)
Creatinine, Ser: 1.12 mg/dL — ABNORMAL HIGH (ref 0.44–1.00)
GFR calc Af Amer: 51 mL/min — ABNORMAL LOW (ref >60)
GFR calc non Af Amer: 44 mL/min — ABNORMAL LOW (ref >60)
Glucose, Bld: 156 mg/dL — ABNORMAL HIGH (ref 70–99)
Potassium: 4.5 mmol/L (ref 3.5–5.1)
Sodium: 136 mmol/L (ref 135–145)
Total Bilirubin: 0.1 mg/dL — ABNORMAL LOW (ref 0.3–1.2)
Total Protein: 5.3 g/dL — ABNORMAL LOW (ref 6.5–8.1)

## 2019-11-25 LAB — GLUCOSE, CAPILLARY
Glucose-Capillary: 207 mg/dL — ABNORMAL HIGH (ref 70–99)
Glucose-Capillary: 145 mg/dL — ABNORMAL HIGH (ref 70–99)
Glucose-Capillary: 149 mg/dL — ABNORMAL HIGH (ref 70–99)
Glucose-Capillary: 178 mg/dL — ABNORMAL HIGH (ref 70–99)

## 2019-11-25 LAB — MAGNESIUM: Magnesium: 2.4 mg/dL (ref 1.7–2.4)

## 2019-11-25 LAB — FOLATE: Folate: 18.5 ng/mL (ref >5.9)

## 2019-11-25 LAB — FERRITIN: Ferritin: 191 ng/mL (ref 11–307)

## 2019-11-25 LAB — PHOSPHORUS: Phosphorus: 2.8 mg/dL (ref 2.5–4.6)

## 2019-11-25 LAB — VITAMIN B12: Vitamin B-12: 2766 pg/mL — ABNORMAL HIGH (ref 180–914)

## 2019-11-25 MED ORDER — HYDROCERIN EX CREA
TOPICAL_CREAM | Freq: Two times a day (BID) | CUTANEOUS | Status: DC
  Administered 2019-11-26 – 2019-11-28 (×3): 1 via TOPICAL
  Filled 2019-11-25: qty 113

## 2019-11-25 NOTE — Progress Notes (Signed)
Occupational Therapy Treatment Patient Details Name: [name] MRN: [id number] DOB: 1930/06/05 Today's Date: 11/25/2019    History of present illness 86 y.o. female with history of hypertension chronic lower extremity edema was brought to the ER after patient's daughter found the patient was confused. 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 No evidence of metastatic disease.   OT comments  Treatment focused on improving patient's ability to perform and participate in self care tasks and improving functional mobility. Patient performed transfers and toileting with assistance of one today but still needed significant assistance and compensatory strategies. Cont POC   Follow Up Recommendations  SNF    Equipment Recommendations  3 in 1 bedside commode    Recommendations for Other Services      Precautions / Restrictions Precautions Precautions: Fall Restrictions Weight Bearing Restrictions: No       Mobility Bed Mobility         Supine to sit: Mod assist     General bed mobility comments: Min assist to transfer to the side of the bed - very slowly with increased time and use of bed rails. Hand hold to pull into sitting. Mod assist for return to supine for assistance wit bother LEs.  Transfers       Sit to Stand: Min assist Stand pivot transfers: Min assist       General transfer comment: Min assist to stand from elevated bed using RW. Min assist for stand pivot to [facility] and then back to bed. Required verbal cues for hand placement on BSC and Rw and to push through arms.    Balance           Standing balance support: Bilateral upper extremity supported   Standing balance comment: Patient used BUEs on walker for balance. Two small balance losses with taking steps - one that therapist corrected and the other patient corrected.                           ADL either  performed or assessed with clinical judgement   ADL                         Lower Body Dressing Details (indicate cue type and reason): Patient transferred to edge of bed for donning socks task. Patient mod assist to donn right sock - with therapist holding the weight of patient's leg at mid shin area. Patient needed assistance getting sock over toes. With donning left sock therapist suggested bringing thigh up onto the bed - but patient unable. Therapist provided stood and after min assist to get sock around toes patient able to pull sock on and over ankle. Toilet Transfer: BSC;Minimal assistance;Cueing for Marketing executive Details (indicate cue type and reason): Min assist for steadying and walker management to transfer to the [facility] placed on her right. verbal cues for hand placement on BSC from Rw. Toileting- Clothing Manipulation and Hygiene: Maximal assistance               Vision       Perception     Praxis      Cognition Arousal/Alertness: Awake/alert Behavior During Therapy: WFL for tasks assessed/performed Overall Cognitive Status: Within Functional Limits for tasks assessed Area of Impairment: Problem solving;Safety/judgement  Memory: Decreased short-term memory Following Commands: Follows one step commands consistently Safety/Judgement: Decreased awareness of safety   Problem Solving: Slow processing;Decreased initiation General Comments: Improving ability to follow commands. Continues to need increased time to initiate tasks and answer questions.        Exercises     Shoulder Instructions       General Comments      Pertinent Vitals/ Pain          Home Living                                          Prior Functioning/Environment              Frequency  Min 2X/week        Progress Toward Goals  OT Goals(current goals can now be found in the care plan section)        Plan       Co-evaluation                 AM-PAC OT "6 Clicks" Daily Activity     Outcome Measure                    End of Session Equipment Utilized During Treatment: Gait belt;Rolling walker  OT Visit Diagnosis: History of falling (Z91.81);Low vision, both eyes (H54.2);Other abnormalities of gait and mobility (R26.89);Muscle weakness (generalized) (M62.81);Other symptoms and signs involving cognitive function   Activity Tolerance Patient tolerated treatment well   Patient Left in bed;with call bell/phone within reach;with bed alarm set;with family/visitor present   Nurse Communication Mobility status        Time:  -     Charges: OT Treatments $Self Care/Home Management : 38-52 mins  Derl Barrow, OTR/[place]  Office [phone]    Lenward Chancellor 11/25/2019, 4:08 PM

## 2019-11-25 NOTE — Progress Notes (Signed)
Supervised RN student Cortney administer solumedrol IVPB.

## 2019-11-25 NOTE — Progress Notes (Signed)
PROGRESS NOTE    [name]  TDH:[id number] DOB: 05/27/1930 DOA: 11/18/2019 PCP: Kerin Perna, NP   Brief Narrative:  The patient is an 86 year old African-American female with past medical history significant for but not limited to history of CKD, morbid obesity, hypertension, bilateral lower extremity leg edema as well as osteoarthritis who presented to [facility] via EMS for evaluation of AMS which has been going on for last 2 days.  Family was worried about a UTI as UTIs caused her AMS in the past.  She is noted to have bilateral lower extremity edema in the ED and her lactate was elevated.  Her urinalysis however was negative as well as her chest x-ray.  A CT of the head was obtained revealing a new areas of hyperintense T2 weighted signal within the right occipital and right anterior temporal white matter.  Imaging was recommended with IV contrast with MRI and this was done.  Neurology was consulted for further evaluation recommendations and they initially recommended repeating the MRI of the brain with contrast.  I spoke with Dr. Amie Portland who is going to be discussing the case with some of his colleagues.  Repeat MRI showed  "Findings consistent with either amyloid beta related angiitis or inflammatory cerebral amyloid angiopathy. No evidence of metastatic disease. I doubt the diagnoses of atypical PRES, prior ischemic infarction, prior trauma or PML, which were considered." Her lactic acidosis has improved and she is much more awake and alert today and she was reportedly.  SLP came to evaluate the patient and they are recommending a regular diet with thin liquids.  Patient's renal function is improving however her CK was elevated on admission.  We will continue IV fluid hydration with normal saline at 75 mL's per hour for next 12 hours and obtain a PT and OT consultation.  **Interim History IV fluids have now stopped and there is concern for cerebral amyloid angiopathy related  inflammation based on her imaging.  Neurology wants to rule out other sources and causes such as viral infection or autoimmune diseases and have recommended an LP but patient and family was undecided and likely did not want to pursue it however have now decided they want to get it done. Patient was evaluated by PT and OT and they are recommending SNF however patient and family want to talk to Social Work about placement. Have called IR to arrange for LP to be Done Under Fluoro   11/22/2019: She underwent fluoroscopy guided LP yesterday and the LP studies have been reviewed and neurology does not feel that she has an acute infection so the recommend starting steroids.  She was started on 1 g of Solu-Medrol for 5 days.  This morning when I went to see her she was more confused and unable to move her right arm so we will obtain a stat head MRI which is still pending.  Neurology was notified and they will see the patient later today.  11/23/2019: She got the first dose of IV Solu-Medrol yesterday and she is improving today.  She has more motion on her hand and right upper extremity duplex is still pending to be done but x-ray showed likely calcific tendinopathy.  11/24/2019 She continues to improve daily.  She is able to ambulate to the bedside commode.  PT OT recommending SNF and patient's family has selected SNF for discharge.  She will be discharged after her Solu-Medrol dose for continued therapies.  Right upper extremity duplex was negative for DVT.  She will receive her third dose of Solu-Medrol tonight.  11/25/2019 She is to get her fourth dose of Solu-Medrol today.  She is doing fairly well.  Asking about compression stockings.  IV fluid has now stopped.  Renal function slowly improving.  Patient has no complaints and anticipating discharging tomorrow after her last dose of Solu-Medrol  Assessment & Plan:   Principal Problem:   Acute encephalopathy Active Problems:   Hypertension  Acute  Encephalopathy with abnormal MRI brain, improving and daughter feels she is close to baseline -Neurology was consulted and they recommended MRI of the brain with contrast.  -Patient had some difficulty swallowing get speech therapist evaluation evaluated and recommending a regular diet with thin liquids.  -In addition we will check ammonia TSH B12; ammonia was elevated at 50 and was within normal limits at 1.775 -Patient was found to have an elevated CK and was continued on IV fluid hydration gently with normal saline at 75 MLS per hour and had initially stopped but will be resumed.  CK is now trending down from 1074 now 690 checked but she is back on fluids and will likely stop these later this evening. -MRI of the brain with contrast showed "Findings consistent with either amyloid beta related angiitis or inflammatory cerebral amyloid angiopathy. No evidence of metastatic disease. I doubt the diagnoses of atypical  PRES, prior ischemic infarction, prior trauma or PML, which were considered." -Dr. Rory Percy evaluated and reviewed her imaging and felt that there is no enhancement seen. Per his note: "Vasogenic edema in the occipital cortex and temporal cortex unilaterally with susceptibility weighted images showing multiple foci of hemosiderin deposit peripherally and an amyloid angiopathy pattern.";  He feels that the main considerations are for amyloid beta related angiitis or inflammatory cerebral amyloid angiopathy/cerebral amyloid angiopathy related inflammation is at the top of the differential -Patient underwent fluoroscopy guided LP this admission -Dr. Rory Percy does not have a high suspicion for bacterial infection but feel that a viral process or autoimmune inflammatory process could be presenting like this but he feels that imaging more feels like Cerebral Amyloid Angiopathy Related Inflammation/Amyloid Beta Related Angiitis -PT OT evaluated and recommending SNF however patient and daughter are  hesitant about going to SNF currently and wants to speak with Social Work -LP done and showed 79 glucose, 49 RBCs, 0 WBCs, too few to count other cells, 80 total protein, with CSF culture still pending and showed no WBC seen, no organisms seen, and no growth at 3 days -Neurology recommends starting the patient on high-dose Solu-Medrol 1 g daily for 5 days and today is day 4 out of 5; we will add Protonix for GI prophylaxis -She also had some worsening mental status on the morning of 11/22/19 and was more confused and unable to lift her right arm and it was very weak compared to her left arm so we will obtain a stat another stat head MRI to evaluate for CVA; Neurology was notified and they do not recommend calling a code stroke currently and they will see the patient later on today.  Neurology feels that she is having "amyloid spells" -Repeat MRI showed "Unchanged MRI appearance of the brain since 11/18/2019, with areas of vasogenic edema in the right parietal lobe and left temporal lobe most compatible with amyloid related inflammation such as ABRA (amyloid beta related angitis). No new intracranial abnormality." -Patient has more motion in her arm again today and the x-ray of her elbow showed no acute osseous abnormality and a  thin curvilinear soft tissue calcification adjacent to the right olecranon process and lateral epicondyle of the distal right humerus which likely represents calcific tendinopathy -Upper extremity duplex of the right arm was negative for any DVT -The patient's mentation is improving and she will need to follow-up with [facility] in the outpatient setting after she is completed hospitalization and 5 days of IV Solu-Medrol. -PT OT recommending SNF and patient's family has elected to go to SNF.  She will be discharged to [facility]; repeat Covid testing was negative -We will discharge to SNF tomorrow afternoon after she received her fifth dose of Solu-Medrol  Hypertension  -She is  on IV hydralazine as needed now every 4 hours for systolic blood pressure greater than 527 or diastolic blood pressure greater than 110 -We resumed her home losartan 50 mg p.o. daily the day I have discontinued again given her AKI -Patient blood pressure is currently stable without antihypertensives and is now 134/75  Lower Extremity Edema, stable  and slightly worsened -On diuretics as an outpatient but we are currently holding these start IV fluid hydration given her slightly worsened renal function; -IV fluid hydration is now stopped -Continue to monitor carefully  Right Arm Pain and Weakness -Is more swollen compared to the left -We will check Upper Extremity duplex to rule out DVT is still pending to be done and Negative -X-Ray was done showed a thin curvilinear soft tissue calcification adjacent to the right olecranon process and lateral epicondyle of the distal right humerus which likely represents calcific tendinopathy -Neurology evaluated and does not feel that it is secondary to any neurological deficit  AKI on chronic Kidney Disease Stage III Metabolic acidosis, improved -Creatinine appearrf to be at baseline and had improved but slightly worsened again  -She did have a mild metabolic acidosis with a CO2 of 20, and anion gap of 10, and chloride level of 112; now CO2 is 24, anion gap is 10, and chloride level is 105 -IV fluid hydration is now stopped -BUN/Cr is trending down and went from 21/1.32 on admission and had trended down to 17/0.95 but had worsened to 39/1.37 and is now 44/1.12 -Avoid nephrotoxic medications, contrast dyes, hypotension and renally adjust medications -Continue to monitor and trend renal function -Repeat CMP in a.m.  Hypophosphatemia -Patient's phosphorus level was 2.2 yesterday morning and today is 2.8 -Continue to monitor and replete as necessary -Repeat phosphorus level in the a.m.  Normocytic Anemia  -Appears to be chronic.  -Follow CBC.   -Hemoglobin/hematocrit went from 10.1/34.0 -> 10.0/32.8 -> 9.2/30.7 -> 10.8/36.0 -> 10.1/32.9 -> 8.8/29.1 -Anemia panel done and showed an iron level of 34, U IBC 179, TIBC of 213, saturation ratios of 16%, ferritin level of 191, folate level of 18.5, and vitamin B12 2766 -Continue to monitor for signs and symptoms of bleeding; currently no overt bleeding noted -Repeat CBC in a.m.  Hyperglycemia in the setting of Prediabetes -Worsened in the setting of IV Steroid Demargination -HbA1c is now 5.8 -Patient blood sugar on day yesterday morning's CMP was 311; today it is now 152 -CBGs have been ranging from 145-207 -Have placed the patient on moderate NovoLog/scale insulin before meals and at bedtime and may need to further adjust insulin regimen  Hypekalemia, improved -Patient is potassium was 3.4 and went to 5.3 for the last 2 days and today it is 4.5  -Will give a dose of Lokelma 10 mg x1 and is improved -Continue to monitor and replete as necessary -Repeat CMP in  the a.m.  Leg pain, improving -Patient is complaining of some left-sided leg pain -Continue with gabapentin and have added tramadol -If continues to worsen will likely pursue further imaging but currently it is stable -Continue with PT and OT efforts and reposition the patient  Obesity -Estimated body mass index is 32.58 kg/m as calculated from the following:   Height as of this encounter: 4\' 10"  (1.473 m).   Weight as of this encounter: 70.7 kg. -Weight Loss and Dietary Counseling given   Abnormal LFTs -Likely in the setting of high-dose steroids -Patient's AST went from 36 and had worsened to 74 and is now improved 40 -Patient's ALT has gone from 26 and had worsened to 70 and is now 56 -Continue to monitor and trend carefully and if continues to worsen further is not improving significantly will consider a right upper quadrant ultrasound as well as a acute hepatitis panel but likely this is from the  steroids -Continue monitor and trend hepatic function panel and repeat CMP in a.m.  DVT prophylaxis: SCDs in case she decides for LP; currently on hold pharmacological prophylaxis for now until appears over with Code Status: FULL CODE  Family Communication: Discussed with Daughter at bedside  Disposition Plan: Patient is from home and PT OT evaluated and recommending skilled nursing facility and she has a bed.She will need to remain hospitalized for IV Solu-Medrol daily until completion and her last dose is tomorrow AM at 10:00  Status is: Inpatient  Remains inpatient appropriate because:Ongoing diagnostic testing needed not appropriate for outpatient work up and Unsafe d/c plan   Dispo: The patient is from: Home              Anticipated d/c is to: SNF              Anticipated d/c date is: 1 day due to Daily Steroids              Patient currently is not medically stable to d/c. Can D/C tomorrow afternoon after Last dose of Solumedrol and follow up with [facility] in the outpatient setting   Consultants:   Neurology   Procedures:  MRI's  Lumbar Puncture  Antimicrobials: Anti-infectives (From admission, onward)   Start     Dose/Rate Route Frequency Ordered Stop   11/18/19 1530  cefTRIAXone (ROCEPHIN) 1 g in sodium chloride 0.9 % 100 mL IVPB  Status:  Discontinued     1 g 200 mL/hr over 30 Minutes Intravenous Every 24 hours 11/18/19 1526 11/19/19 0429     Subjective: Seen and examined at bedside feels that she is doing better.  No complaints and denies any pain.  Wanting her compression stockings.  Thinks her ankles are little more swollen today.  No other concerns or complaints at this time and feels well and daughter thinks that she is almost back to baseline.  Objective: Vitals:   11/24/19 2057 11/25/19 0558 11/25/19 0800 11/25/19 1010  BP: (!) 127/59 (!) 142/69 134/75   Pulse: 72 77 70 75  Resp: 18 17 16    Temp: 98 F (36.7 C) 97.8 F (36.6 C) 97.6 F (36.4 C)    TempSrc: Oral Oral Oral   SpO2: 100% 100% 100%   Weight:      Height:        Intake/Output Summary (Last 24 hours) at 11/25/2019 1426 Last data filed at 11/25/2019 0600 Gross per 24 hour  Intake 1150.57 ml  Output 500 ml  Net 650.57 ml  Filed Weights   11/19/19 0200  Weight: 70.7 kg   Examination: Physical Exam:  Constitutional: WN/WD African-American female who is currently improving and appears calm and comfortable sitting up drinking her morning tea.  Has no complaints currently. Eyes: She is blind in the left eye  ENMT: External Ears, Nose appear normal.  Mild hearing impairment Neck: Appears normal, supple, no cervical masses, normal ROM, no appreciable thyromegaly: No JVD Respiratory: Diminished to auscultation bilaterally, no wheezing, rales, rhonchi or crackles. Normal respiratory effort and patient is not tachypenic. No accessory muscle use.  Not wearing any supplemental oxygen via nasal cannula Cardiovascular: RRR, no murmurs / rubs / gallops. S1 and S2 auscultated.  1+ lower extremity and pedal edema more ankle puffiness today.   Abdomen: Soft, non-tender, distended secondary to body habitus. Bowel sounds positive.  GU: Deferred. Musculoskeletal: No clubbing / cyanosis of digits/nails. No joint deformity upper and lower extremities.  Skin: No rashes, lesions, ulcers on limited skin evaluation. No induration; Warm and dry.  Neurologic: CN 2-12 grossly intact with no focal deficits. Romberg sign and cerebellar reflexes not assessed.  Psychiatric: Normal judgment and insight. Alert and oriented x 3. Normal mood and appropriate affect.   Data Reviewed: I have personally reviewed following labs and imaging studies  CBC: Recent Labs  Lab 11/21/19 0424 11/22/19 0432 11/23/19 0534 11/24/19 0504 11/25/19 0447  WBC 6.3 5.8 5.6 7.1 6.0  NEUTROABS 4.1 3.8 4.9 6.1 5.2  HGB 10.0* 9.2* 10.8* 10.1* 8.8*  HCT 32.8* 30.7* 36.0 32.9* 29.1*  MCV 84.3 85.5 87.0 83.7 84.6  PLT  182 181 228 257 209   Basic Metabolic Panel: Recent Labs  Lab 11/21/19 0424 11/22/19 0432 11/23/19 0534 11/24/19 0504 11/25/19 0447  NA 141 142 139 139 136  K 3.4* 4.7 5.3* 5.3* 4.5  CL 109 109 105 106 107  CO2 25 24 24 25 23   GLUCOSE 115* 144* 311* 152* 156*  BUN 17 29* 39* 45* 44*  CREATININE 0.95 1.27* 1.37* 1.28* 1.12*  CALCIUM 8.0* 8.3* 8.5* 8.5* 7.8*  MG 2.0 2.2 2.3 2.5* 2.4  PHOS 2.6 2.2* 2.8 2.7 2.8   GFR: Estimated Creatinine Clearance: 29.5 mL/min (A) (by C-G formula based on SCr of 1.12 mg/dL (H)). Liver Function Tests: Recent Labs  Lab 11/21/19 0424 11/22/19 0432 11/23/19 0534 11/24/19 0504 11/25/19 0447  AST 26 36 42* 74* 40  ALT 20 26 43 70* 56*  ALKPHOS 54 58 89 76 72  BILITOT 0.9 0.7 0.4 0.5 0.1*  PROT 5.4* 5.5* 6.5 6.3* 5.3*  ALBUMIN 2.3* 2.2* 2.3* 2.2* 1.8*   No results for input(s): LIPASE, AMYLASE in the last 168 hours. Recent Labs  Lab 11/19/19 0530  AMMONIA 50*   Coagulation Profile: Recent Labs  Lab 11/18/19 1603  INR 1.1   Cardiac Enzymes: Recent Labs  Lab 11/19/19 0442 11/20/19 0528  CKTOTAL 1,074* 690*   BNP (last 3 results) No results for input(s): PROBNP in the last 8760 hours. HbA1C: Recent Labs    11/23/19 0524  HGBA1C 5.8*   CBG: Recent Labs  Lab 11/24/19 1217 11/24/19 1635 11/24/19 2055 11/25/19 0730 11/25/19 1200  GLUCAP 183* 331* 178* 207* 145*   Lipid Profile: No results for input(s): CHOL, HDL, LDLCALC, TRIG, CHOLHDL, LDLDIRECT in the last 72 hours. Thyroid Function Tests: No results for input(s): TSH, T4TOTAL, FREET4, T3FREE, THYROIDAB in the last 72 hours. Anemia Panel: Recent Labs    11/25/19 0447  VITAMINB12 2,766*  FOLATE 18.5  FERRITIN  191  TIBC 213*  IRON 34  RETICCTPCT 1.2   Sepsis Labs: Recent Labs  Lab 11/18/19 1603 11/19/19 0530 11/19/19 0716  LATICACIDVEN 2.9* 1.3 1.5    Recent Results (from the past 240 hour(s))  Blood culture (routine x 2)     Status: None    Collection Time: 11/18/19  1:48 PM   Specimen: BLOOD  Result Value Ref Range Status   Specimen Description   Final    BLOOD LEFT ANTECUBITAL Performed at [facility] [place] [address] [place], [place] [address]    Special Requests   Final    BOTTLES DRAWN AEROBIC AND ANAEROBIC Blood Culture results may not be optimal due to an excessive volume of blood received in culture bottles Performed at [facility] [address] [place], [place] [address]    Culture   Final    NO GROWTH 5 DAYS Performed at [facility], [place] [address] [place], [place] [address]    Report Status 11/23/2019 FINAL  Final  Blood culture (routine x 2)     Status: None   Collection Time: 11/18/19  2:05 PM   Specimen: BLOOD  Result Value Ref Range Status   Specimen Description   Final    BLOOD RIGHT ANTECUBITAL Performed at [facility] [address] [place], [place] [address]    Special Requests   Final    BOTTLES DRAWN AEROBIC ONLY Blood Culture results may not be optimal due to an inadequate volume of blood received in culture bottles Performed at [facility] [address], [place], [place] [address]    Culture   Final    NO GROWTH 5 DAYS Performed at [facility], [place] [address], [place], [place] [address]    Report Status 11/23/2019 FINAL  Final  Urine culture     Status: Abnormal   Collection Time: 11/18/19  2:31 PM   Specimen: In/Out Cath Urine  Result Value Ref Range Status   Specimen Description   Final    IN/OUT CATH URINE Performed at [facility] [address] [place], [place] [address]    Special Requests   Final    NONE Performed at [facility], [place] [address], [place], [place] [address]    Culture (A)  Final    5,000 COLONIES/mL GROUP B STREP(S.AGALACTIAE)ISOLATED TESTING AGAINST S. AGALACTIAE NOT ROUTINELY PERFORMED DUE TO PREDICTABILITY OF  AMP/PEN/VAN SUSCEPTIBILITY. Performed at [facility], [place] [address] [place], [place] [address]    Report Status 11/20/2019 FINAL  Final  SARS CORONAVIRUS 2 (TAT 6-24 HRS) Nasopharyngeal Nasopharyngeal Swab     Status: None   Collection Time: 11/18/19 11:09 PM   Specimen: Nasopharyngeal Swab  Result Value Ref Range Status   SARS Coronavirus 2 NEGATIVE NEGATIVE Final    Comment: (NOTE) SARS-CoV-2 target nucleic acids are NOT DETECTED. The SARS-CoV-2 RNA is generally detectable in upper and lower respiratory specimens during the acute phase of infection. Negative results do not preclude SARS-CoV-2 infection, do not rule out co-infections with other pathogens, and should not be used as the sole basis for treatment or other patient management decisions. Negative results must be combined with clinical observations, patient history, and epidemiological information. The expected result is Negative. Fact Sheet for Patients: SugarRoll.be Fact Sheet for Healthcare Providers: https://www.woods-mathews.com/ This test is not yet approved or cleared by the [place] FDA and  has been authorized for detection  and/or diagnosis of SARS-CoV-2 by FDA under an Emergency Use Authorization (EUA). This EUA will remain  in effect (meaning this test can be used) for the duration of the COVID-19 declaration under Section 56 4(b)(1) of the Act, 21 U.S.C. section 360bbb-3(b)(1), unless the authorization is terminated or revoked sooner. Performed at [facility], [place] [address] [place], [place] [address]   CSF culture     Status: None   Collection Time: 11/21/19  3:03 PM   Specimen: PATH Cytology CSF; Cerebrospinal Fluid  Result Value Ref Range Status   Specimen Description   Final    CSF Performed at [facility] [address] [place], [place] [address]    Special Requests   Final    NONE Performed at [facility], [place] [address] [place], [place] [address]    Gram Stain   Final    NO WBC SEEN NO ORGANISMS SEEN CYTOSPIN SMEAR Gram Stain Report Called to,Read Back By and Verified With: S.ADAMSON AT 1805 ON 11/21/19 BY N.THOMPSON Performed at [facility], [place] [address] [place], [place] [address]    Culture   Final    NO GROWTH 3 DAYS Performed at [facility], [place] [address] [place], [place] [address]    Report Status 11/25/2019 FINAL  Final  Respiratory Panel by RT PCR (Flu A&B, Covid) - Nasopharyngeal Swab     Status: None   Collection Time: 11/24/19  6:13 PM   Specimen: Nasopharyngeal Swab  Result Value Ref Range Status   SARS Coronavirus 2 by RT PCR NEGATIVE NEGATIVE Final    Comment: (NOTE) SARS-CoV-2 target nucleic acids are NOT DETECTED. The SARS-CoV-2 RNA is generally detectable in upper respiratoy specimens during the acute phase of infection. The lowest concentration of SARS-CoV-2 viral copies this assay can detect is 131 copies/mL. A negative result does not preclude SARS-Cov-2 infection and should not be used as the sole basis for treatment or other patient management decisions. A negative result may occur with  improper specimen collection/handling, submission of specimen other than nasopharyngeal swab, presence of viral mutation(s) within the areas targeted by this assay, and inadequate number of viral copies (<131 copies/mL). A negative result must be combined with clinical observations, patient history, and epidemiological information. The expected result is Negative. Fact Sheet for Patients:  PinkCheek.be Fact Sheet for Healthcare Providers:  GravelBags.it This test is not yet ap proved or cleared by the [place] FDA and  has been authorized for detection and/or diagnosis of SARS-CoV-2 by FDA under an Emergency Use Authorization (EUA). This EUA will remain  in  effect (meaning this test can be used) for the duration of the COVID-19 declaration under Section 564(b)(1) of the Act, 21 U.S.C. section 360bbb-3(b)(1), unless the authorization is terminated or revoked sooner.    Influenza A by PCR NEGATIVE NEGATIVE Final   Influenza B by PCR NEGATIVE NEGATIVE Final    Comment: (NOTE) The Xpert Xpress SARS-CoV-2/FLU/RSV assay is intended as an aid in  the diagnosis of influenza from Nasopharyngeal swab specimens and  should not be used as a sole basis for treatment. Nasal washings and  aspirates are unacceptable for Xpert Xpress SARS-CoV-2/FLU/RSV  testing. Fact Sheet for Patients: PinkCheek.be Fact Sheet for Healthcare Providers: GravelBags.it This test is not yet approved or cleared by the [place] FDA and  has been authorized for detection and/or diagnosis of SARS-CoV-2 by  FDA under an Emergency Use Authorization (EUA). This EUA  will remain  in effect (meaning this test can be used) for the duration of the  Covid-19 declaration under Section 564(b)(1) of the Act, 21  U.S.C. section 360bbb-3(b)(1), unless the authorization is  terminated or revoked. Performed at [facility], [place] [address], [place],  [address]      RN Pressure Injury Documentation:     Estimated body mass index is 32.58 kg/m as calculated from the following:   Height as of this encounter: 4\' 10"  (1.473 m).   Weight as of this encounter: 70.7 kg.  Malnutrition Type:      Malnutrition Characteristics:      Nutrition Interventions:    Radiology Studies: VAS [place] UPPER EXTREMITY VENOUS DUPLEX  Result Date: 11/24/2019 UPPER VENOUS STUDY  Indications: Edema Comparison Study: No prior study Performing Technologist: Maudry Mayhew MHA, RDMS, RVT, RDCS  Examination Guidelines: A complete evaluation includes B-mode imaging, spectral Doppler, color Doppler, and power Doppler as  needed of all accessible portions of each vessel. Bilateral testing is considered an integral part of a complete examination. Limited examinations for reoccurring indications may be performed as noted.  Right Findings: +----------+------------+---------+-----------+----------+-------+ RIGHT     CompressiblePhasicitySpontaneousPropertiesSummary +----------+------------+---------+-----------+----------+-------+ IJV           Full       Yes       Yes                      +----------+------------+---------+-----------+----------+-------+ Subclavian    Full       Yes       Yes                      +----------+------------+---------+-----------+----------+-------+ Axillary      Full       Yes       Yes                      +----------+------------+---------+-----------+----------+-------+ Brachial      Full       Yes       Yes                      +----------+------------+---------+-----------+----------+-------+ Radial        Full                                          +----------+------------+---------+-----------+----------+-------+ Ulnar         Full                                          +----------+------------+---------+-----------+----------+-------+ Cephalic      Full                                          +----------+------------+---------+-----------+----------+-------+ Basilic       Full                                          +----------+------------+---------+-----------+----------+-------+  Left Findings: +----------+------------+---------+-----------+----------+-------+ LEFT      CompressiblePhasicitySpontaneousPropertiesSummary +----------+------------+---------+-----------+----------+-------+ Subclavian  Yes       Yes                      +----------+------------+---------+-----------+----------+-------+  Summary:  Right: No evidence of deep vein thrombosis in the upper extremity. No evidence of superficial  vein thrombosis in the upper extremity.  Left: No evidence of thrombosis in the subclavian.  *See table(s) above for measurements and observations.  Diagnosing physician: Harold Barban MD Electronically signed by Harold Barban MD on 11/24/2019 at 8:55:10 AM.    Final    Scheduled Meds: . aspirin EC  81 mg Oral Daily  . docusate sodium  50 mg Oral BID  . gabapentin  100 mg Oral BID WC  . gabapentin  200 mg Oral QHS  . heparin injection (subcutaneous)  5,000 Units Subcutaneous Q8H  . hydrocerin   Topical BID  . insulin aspart  0-15 Units Subcutaneous TID WC  . insulin aspart  0-5 Units Subcutaneous QHS  . multivitamin with minerals  1 tablet Oral Daily  . pantoprazole  40 mg Oral Daily   Continuous Infusions: . methylPREDNISolone (SOLU-MEDROL) injection 1,000 mg (11/25/19 1038)    LOS: 6 days   Kerney Elbe, DO [facility] PAGER is on Reserve  If 7PM-7AM, please contact night-coverage [email or web address]

## 2019-11-25 NOTE — Progress Notes (Addendum)
Patient assessed post CMT call. MT called to report increased HR/SVT for six seconds. Pt denies chest pain and or discomfort of any kind. See flow sheet updated vital signs.Will continue to monitor.

## 2019-11-25 NOTE — Consult Note (Signed)
WOC Nurse Consult Note: Medical adhesive related skin injury (MARSI) related to increased edema and fluid overload.  When prophylactic dressing was removed, epithelial shearing noted to right gluteal fold.  Reason for Consult:provide topical treatment Wound type:MARSI Pressure Injury POA: NA Measurement: 2 cm x 1 cm x 0.1 cm shearing of epithelium Wound KNL:ZJQB and moist Drainage (amount, consistency, odor) scant weeping Periwound:moist Dressing procedure/placement/frequency: Cleanse buttocks and sacrum with soap and water and pat dry. Apply Eucerin cream twice daily and PRN soilage.  No disposable briefs or underpads while in bed.  Will not follow at this time.  Please re-consult if needed.  Domenic Moras MSN, RN, FNP-BC CWON Wound, Ostomy, Continence Nurse Pager [phone]

## 2019-11-26 ENCOUNTER — Inpatient Hospital Stay (HOSPITAL_COMMUNITY): Payer: Medicare Other

## 2019-11-26 DIAGNOSIS — R600 Localized edema: Secondary | ICD-10-CM

## 2019-11-26 LAB — COMPREHENSIVE METABOLIC PANEL
ALT: 49 U/L — ABNORMAL HIGH (ref 0–44)
AST: 25 U/L (ref 15–41)
Albumin: 1.9 g/dL — ABNORMAL LOW (ref 3.5–5.0)
Alkaline Phosphatase: 75 U/L (ref 38–126)
Anion gap: 6 (ref 5–15)
BUN: 46 mg/dL — ABNORMAL HIGH (ref 8–23)
CO2: 24 mmol/L (ref 22–32)
Calcium: 8.1 mg/dL — ABNORMAL LOW (ref 8.9–10.3)
Chloride: 108 mmol/L (ref 98–111)
Creatinine, Ser: 1.19 mg/dL — ABNORMAL HIGH (ref 0.44–1.00)
GFR calc Af Amer: 48 mL/min — ABNORMAL LOW (ref >60)
GFR calc non Af Amer: 41 mL/min — ABNORMAL LOW (ref >60)
Glucose, Bld: 177 mg/dL — ABNORMAL HIGH (ref 70–99)
Potassium: 4.6 mmol/L (ref 3.5–5.1)
Sodium: 138 mmol/L (ref 135–145)
Total Bilirubin: 0.2 mg/dL — ABNORMAL LOW (ref 0.3–1.2)
Total Protein: 5.4 g/dL — ABNORMAL LOW (ref 6.5–8.1)

## 2019-11-26 LAB — CBC WITH DIFFERENTIAL/PLATELET
Abs Immature Granulocytes: 0.08 10*3/uL — ABNORMAL HIGH (ref 0.00–0.07)
Basophils Absolute: 0 10*3/uL (ref 0.0–0.1)
Basophils Relative: 0 %
Eosinophils Absolute: 0 10*3/uL (ref 0.0–0.5)
Eosinophils Relative: 0 %
HCT: 30.7 % — ABNORMAL LOW (ref 36.0–46.0)
Hemoglobin: 9.4 g/dL — ABNORMAL LOW (ref 12.0–15.0)
Immature Granulocytes: 1 %
Lymphocytes Relative: 10 %
Lymphs Abs: 0.6 10*3/uL — ABNORMAL LOW (ref 0.7–4.0)
MCH: 25.6 pg — ABNORMAL LOW (ref 26.0–34.0)
MCHC: 30.6 g/dL (ref 30.0–36.0)
MCV: 83.7 fL (ref 80.0–100.0)
Monocytes Absolute: 0.3 10*3/uL (ref 0.1–1.0)
Monocytes Relative: 6 %
Neutro Abs: 4.6 10*3/uL (ref 1.7–7.7)
Neutrophils Relative %: 83 %
Platelets: 314 10*3/uL (ref 150–400)
RBC: 3.67 MIL/uL — ABNORMAL LOW (ref 3.87–5.11)
RDW: 15.5 % (ref 11.5–15.5)
WBC: 5.6 10*3/uL (ref 4.0–10.5)
nRBC: 0 % (ref 0.0–0.2)

## 2019-11-26 LAB — GLUCOSE, CAPILLARY
Glucose-Capillary: 159 mg/dL — ABNORMAL HIGH (ref 70–99)
Glucose-Capillary: 176 mg/dL — ABNORMAL HIGH (ref 70–99)
Glucose-Capillary: 169 mg/dL — ABNORMAL HIGH (ref 70–99)
Glucose-Capillary: 217 mg/dL — ABNORMAL HIGH (ref 70–99)

## 2019-11-26 LAB — PHOSPHORUS: Phosphorus: 3.2 mg/dL (ref 2.5–4.6)

## 2019-11-26 LAB — MAGNESIUM: Magnesium: 2.6 mg/dL — ABNORMAL HIGH (ref 1.7–2.4)

## 2019-11-26 NOTE — Progress Notes (Signed)
Patient has had decreased urine output today. Pt was incontinent once this am. 100 cc out this afternoon. Bladder scan 229 cc. Provider updated. Order received to in and out cath if patient is unable to void.

## 2019-11-26 NOTE — TOC Progression Note (Signed)
Transition of Care [facility]) - Progression Note    Patient Details  Name: [name] MRN: [id number] Date of Birth: 05/13/1930  Transition of Care [facility]) CM/SW Contact  Ross Ludwig, [place] Phone Number: 11/26/2019, 3:40 PM  Clinical Narrative:     CSW talked to [facility], they can not accept patient today, they can accept patient tomorrow if she is medically ready for discharge, CSW to continue to follow patient's progress throughout discharge planning.  Expected Discharge Plan: [place] Barriers to Discharge: Continued Medical Work up  Expected Discharge Plan and Services Expected Discharge Plan: [place]   Discharge Planning Services: CM Consult   Living arrangements for the past 2 months: Single Family Home                                       Social Determinants of Health (SDOH) Interventions    Readmission Risk Interventions No flowsheet data found.

## 2019-11-26 NOTE — Progress Notes (Signed)
Reason for consult: Encephalopathy/weakness  Subjective: Patient has improved both in terms of strength and mental status.  Completed her 5 days of IV steroids and discharge being planned to SNF.  Spoke with the daughter who is unhappy about her going to skilled nursing facility.  ROS: negative except above  Examination  Vital signs in last 24 hours: Temp:  [97.5 F (36.4 C)-100.7 F (38.2 C)] 97.5 F (36.4 C) (04/28 1356) Pulse Rate:  [62-79] 78 (04/28 1356) Resp:  [16-20] 16 (04/28 1356) BP: (128-151)/(59-74) 151/74 (04/28 1356) SpO2:  [99 %-100 %] 99 % (04/28 1356)  General: lying in bed CVS: pulse-normal rate and rhythm RS: breathing comfortably Extremities: normal   Neuro: MS: Alert to her name and place, oriented, follows commands CN: pupils equal and reactive, blind in left eye, EOMI, face symmetric, tongue midline, normal sensation over face, Motor: 4/5 strength in both upper extremities and 3 x 5 strength in bilateral lower extremities  Plantars: flexor Coordination: normal Gait: not tested  Basic Metabolic Panel: Recent Labs  Lab 11/22/19 0432 11/22/19 0432 11/23/19 0534 11/23/19 0534 11/24/19 0504 11/25/19 0447 11/26/19 0436  NA 142  --  139  --  139 136 138  K 4.7  --  5.3*  --  5.3* 4.5 4.6  CL 109  --  105  --  106 107 108  CO2 24  --  24  --  25 23 24   GLUCOSE 144*  --  311*  --  152* 156* 177*  BUN 29*  --  39*  --  45* 44* 46*  CREATININE 1.27*  --  1.37*  --  1.28* 1.12* 1.19*  CALCIUM 8.3*   < > 8.5*   < > 8.5* 7.8* 8.1*  MG 2.2  --  2.3  --  2.5* 2.4 2.6*  PHOS 2.2*  --  2.8  --  2.7 2.8 3.2   < > = values in this interval not displayed.    CBC: Recent Labs  Lab 11/22/19 0432 11/23/19 0534 11/24/19 0504 11/25/19 0447 11/26/19 0436  WBC 5.8 5.6 7.1 6.0 5.6  NEUTROABS 3.8 4.9 6.1 5.2 4.6  HGB 9.2* 10.8* 10.1* 8.8* 9.4*  HCT 30.7* 36.0 32.9* 29.1* 30.7*  MCV 85.5 87.0 83.7 84.6 83.7  PLT 181 228 257 280 314     Coagulation  Studies: No results for input(s): LABPROT, INR in the last 72 hours.  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  Recommendations -Discussed with daughter about oral prednisone, some studies have shown benefit of 60 mg daily prednisone with eventual transition to long-term immunosuppressant such as Cytoxan.  However given her age, prediabetes-she is likely to have increasing side effects.  Would hold off for now, however patient continues to decline may consider oral prednisone with Cytoxan .-Recommend repeat MRI in 2 to 4 weeks and outpatient neurology follow-up with stroke service Dr. Leonie Man -Narda Rutherford with PT OT as patient has improved, may qualify for inpatient rehab    Kiev Labrosse [facility] Pager Number [phone] For questions after 7pm please refer to AMION to reach the Neurologist on call

## 2019-11-26 NOTE — Progress Notes (Signed)
PROGRESS NOTE    [name]  RCV:[id number] DOB: 1929/08/12 DOA: 11/18/2019 PCP: Kerin Perna, NP   Brief Narrative: [name] is a 86 y.o. female with a history of CKD, morbid obesity, hypertension, bilateral lower extremity leg edema as well as osteoarthritis. Patient presented secondary to altered mental status.   Assessment & Plan:   Principal Problem:   Acute encephalopathy Active Problems:   Hypertension   Acute metabolic encephalopathy Concern for cerebral amyloid angiopathy from MRI. MRI also with evidence of vasogenic edema. Neurology consulted and recommended IV steroids x5 days. -Neurology recommendations: holding off on continued steroids secondary to patient age; consideration for outpatient neurology if patient's symptoms worsen. Also recommending repeat MRI brain in 2-4 weeks in addition to follow-up with stroke service, Dr. Leonie Man  Lower extremity edema Slightly improved. No evidence of heart failure. Worsened with IV fluids.  Right arm pain/swelling Venous duplex negative for DVT.  Dyspnea Possibly position. Chest x-ray obtained and without acute process.  AKI on CKD stage III Improved with IV fluids. Stable.  Obesity Body mass index is 32.58 kg/m.   DVT prophylaxis: Heparin subq Code Status:   Code Status: Full Code Family Communication: Daughter at bedside Disposition Plan: Per neurology, patient's family is declining SNF and requesting CIR admission. Will need to address this request with CIR and PT for appropriateness. If not a candidate for CIR, and family still does not want discharge to SNF, patient will be discharged home with maximized home health services.   Consultants:   Neurology  Procedures:   None  Antimicrobials:  None    Subjective: Some dyspnea overnight. Improved this morning.  Objective: Vitals:   11/25/19 1738 11/25/19 2058 11/26/19 0509 11/26/19 1102  BP: 136/68 (!) 128/59 129/72 136/70  Pulse: 78  79 62 71  Resp:  20 18 18   Temp: 97.8 F (36.6 C) (!) 100.7 F (38.2 C) 98.6 F (37 C) (!) 97.5 F (36.4 C)  TempSrc:  Oral  Oral  SpO2: 100% 100% 100% 100%  Weight:      Height:        Intake/Output Summary (Last 24 hours) at 11/26/2019 1354 Last data filed at 11/26/2019 0513 Gross per 24 hour  Intake 298 ml  Output 500 ml  Net -202 ml   Filed Weights   11/19/19 0200  Weight: 70.7 kg    Examination:  General exam: Appears calm and comfortable Respiratory system: Clear/diminished to auscultation. Respiratory effort normal. Cardiovascular system: S1 & S2 heard, RRR. Systolic murmur Gastrointestinal system: Abdomen is nondistended, soft and nontender. No organomegaly or masses felt. Normal bowel sounds heard. Central nervous system: Alert and oriented.  Extremities: Upper and LE edema. No calf tenderness Skin: No cyanosis. No rashes Psychiatry: Judgement and insight appear normal. Mood & affect appropriate.     Data Reviewed: I have personally reviewed following labs and imaging studies  CBC: Recent Labs  Lab 11/22/19 0432 11/23/19 0534 11/24/19 0504 11/25/19 0447 11/26/19 0436  WBC 5.8 5.6 7.1 6.0 5.6  NEUTROABS 3.8 4.9 6.1 5.2 4.6  HGB 9.2* 10.8* 10.1* 8.8* 9.4*  HCT 30.7* 36.0 32.9* 29.1* 30.7*  MCV 85.5 87.0 83.7 84.6 83.7  PLT 181 228 257 280 102   Basic Metabolic Panel: Recent Labs  Lab 11/22/19 0432 11/23/19 0534 11/24/19 0504 11/25/19 0447 11/26/19 0436  NA 142 139 139 136 138  K 4.7 5.3* 5.3* 4.5 4.6  CL 109 105 106 107 108  CO2 24 24 25  23  24  GLUCOSE 144* 311* 152* 156* 177*  BUN 29* 39* 45* 44* 46*  CREATININE 1.27* 1.37* 1.28* 1.12* 1.19*  CALCIUM 8.3* 8.5* 8.5* 7.8* 8.1*  MG 2.2 2.3 2.5* 2.4 2.6*  PHOS 2.2* 2.8 2.7 2.8 3.2   GFR: Estimated Creatinine Clearance: 27.8 mL/min (A) (by C-G formula based on SCr of 1.19 mg/dL (H)). Liver Function Tests: Recent Labs  Lab 11/22/19 0432 11/23/19 0534 11/24/19 0504 11/25/19 0447  11/26/19 0436  AST 36 42* 74* 40 25  ALT 26 43 70* 56* 49*  ALKPHOS 58 89 76 72 75  BILITOT 0.7 0.4 0.5 0.1* 0.2*  PROT 5.5* 6.5 6.3* 5.3* 5.4*  ALBUMIN 2.2* 2.3* 2.2* 1.8* 1.9*   No results for input(s): LIPASE, AMYLASE in the last 168 hours. No results for input(s): AMMONIA in the last 168 hours. Coagulation Profile: No results for input(s): INR, PROTIME in the last 168 hours. Cardiac Enzymes: Recent Labs  Lab 11/20/19 0528  CKTOTAL 690*   BNP (last 3 results) No results for input(s): PROBNP in the last 8760 hours. HbA1C: No results for input(s): HGBA1C in the last 72 hours. CBG: Recent Labs  Lab 11/25/19 1200 11/25/19 1637 11/25/19 2056 11/26/19 0818 11/26/19 1159  GLUCAP 145* 149* 178* 159* 176*   Lipid Profile: No results for input(s): CHOL, HDL, LDLCALC, TRIG, CHOLHDL, LDLDIRECT in the last 72 hours. Thyroid Function Tests: No results for input(s): TSH, T4TOTAL, FREET4, T3FREE, THYROIDAB in the last 72 hours. Anemia Panel: Recent Labs    11/25/19 0447  VITAMINB12 2,766*  FOLATE 18.5  FERRITIN 191  TIBC 213*  IRON 34  RETICCTPCT 1.2   Sepsis Labs: No results for input(s): PROCALCITON, LATICACIDVEN in the last 168 hours.  Recent Results (from the past 240 hour(s))  Blood culture (routine x 2)     Status: None   Collection Time: 11/18/19  1:48 PM   Specimen: BLOOD  Result Value Ref Range Status   Specimen Description   Final    BLOOD LEFT ANTECUBITAL Performed at [facility] [address] [place], [place] [address]    Special Requests   Final    BOTTLES DRAWN AEROBIC AND ANAEROBIC Blood Culture results may not be optimal due to an excessive volume of blood received in culture bottles Performed at [place] [address] [place], [place] [address]    Culture   Final    NO GROWTH 5 DAYS Performed at [facility], [place] [address] [place], [place] [address]    Report Status 11/23/2019 FINAL  Final   Blood culture (routine x 2)     Status: None   Collection Time: 11/18/19  2:05 PM   Specimen: BLOOD  Result Value Ref Range Status   Specimen Description   Final    BLOOD RIGHT ANTECUBITAL Performed at [place] [address], [place], [place] [address]    Special Requests   Final    BOTTLES DRAWN AEROBIC ONLY Blood Culture results may not be optimal due to an inadequate volume of blood received in culture bottles Performed at [place] [address], [place], [place] [address]    Culture   Final    NO GROWTH 5 DAYS Performed at [facility], [place] [address] [place], [place] [address]    Report Status 11/23/2019 FINAL  Final  Urine culture     Status: Abnormal   Collection Time: 11/18/19  2:31 PM  Specimen: In/Out Cath Urine  Result Value Ref Range Status   Specimen Description   Final    IN/OUT CATH URINE Performed at [place] [address], [place], [place] [address]    Special Requests   Final    NONE Performed at [facility], [place] [address], [place], [place] [address]    Culture (A)  Final    5,000 COLONIES/mL GROUP B STREP(S.AGALACTIAE)ISOLATED TESTING AGAINST S. AGALACTIAE NOT ROUTINELY PERFORMED DUE TO PREDICTABILITY OF AMP/PEN/VAN SUSCEPTIBILITY. Performed at [facility], [place] [address], [place], [place] [address]    Report Status 11/20/2019 FINAL  Final  SARS CORONAVIRUS 2 (TAT 6-24 HRS) Nasopharyngeal Nasopharyngeal Swab     Status: None   Collection Time: 11/18/19 11:09 PM   Specimen: Nasopharyngeal Swab  Result Value Ref Range Status   SARS Coronavirus 2 NEGATIVE NEGATIVE Final    Comment: (NOTE) SARS-CoV-2 target nucleic acids are NOT DETECTED. The SARS-CoV-2 RNA is generally detectable in upper and lower respiratory specimens during the acute phase of infection. Negative results do not preclude SARS-CoV-2 infection, do not rule out co-infections with  other pathogens, and should not be used as the sole basis for treatment or other patient management decisions. Negative results must be combined with clinical observations, patient history, and epidemiological information. The expected result is Negative. Fact Sheet for Patients: SugarRoll.be Fact Sheet for Healthcare Providers: https://www.woods-mathews.com/ This test is not yet approved or cleared by the [place] FDA and  has been authorized for detection and/or diagnosis of SARS-CoV-2 by FDA under an Emergency Use Authorization (EUA). This EUA will remain  in effect (meaning this test can be used) for the duration of the COVID-19 declaration under Section 56 4(b)(1) of the Act, 21 U.S.C. section 360bbb-3(b)(1), unless the authorization is terminated or revoked sooner. Performed at [facility], [place] [address] [place], [place] [address]   CSF culture     Status: None   Collection Time: 11/21/19  3:03 PM   Specimen: PATH Cytology CSF; Cerebrospinal Fluid  Result Value Ref Range Status   Specimen Description   Final    CSF Performed at [facility] [address], [place], [place] [address]    Special Requests   Final    NONE Performed at [facility], [place] [address], [place], [place] [address]    Gram Stain   Final    NO WBC SEEN NO ORGANISMS SEEN CYTOSPIN SMEAR Gram Stain Report Called to,Read Back By and Verified With: S.ADAMSON AT 1805 ON 11/21/19 BY N.THOMPSON Performed at [facility], [place] [address], [place], [place] [address]    Culture   Final    NO GROWTH 3 DAYS Performed at [facility], [place] [address], [place], [place] [address]    Report Status 11/25/2019 FINAL  Final  Respiratory Panel by RT PCR (Flu A&B, Covid) - Nasopharyngeal Swab     Status: None   Collection Time: 11/24/19  6:13 PM   Specimen: Nasopharyngeal Swab  Result Value Ref Range  Status   SARS Coronavirus 2 by RT PCR NEGATIVE NEGATIVE Final    Comment: (NOTE) SARS-CoV-2 target nucleic acids are NOT DETECTED. The SARS-CoV-2 RNA is generally detectable in upper respiratoy specimens during the acute phase of infection. The lowest concentration of SARS-CoV-2 viral copies this assay can detect is 131 copies/mL. A negative result does not preclude SARS-Cov-2 infection and should not be used as the  sole basis for treatment or other patient management decisions. A negative result may occur with  improper specimen collection/handling, submission of specimen other than nasopharyngeal swab, presence of viral mutation(s) within the areas targeted by this assay, and inadequate number of viral copies (<131 copies/mL). A negative result must be combined with clinical observations, patient history, and epidemiological information. The expected result is Negative. Fact Sheet for Patients:  PinkCheek.be Fact Sheet for Healthcare Providers:  GravelBags.it This test is not yet ap proved or cleared by the [place] FDA and  has been authorized for detection and/or diagnosis of SARS-CoV-2 by FDA under an Emergency Use Authorization (EUA). This EUA will remain  in effect (meaning this test can be used) for the duration of the COVID-19 declaration under Section 564(b)(1) of the Act, 21 U.S.C. section 360bbb-3(b)(1), unless the authorization is terminated or revoked sooner.    Influenza A by PCR NEGATIVE NEGATIVE Final   Influenza B by PCR NEGATIVE NEGATIVE Final    Comment: (NOTE) The Xpert Xpress SARS-CoV-2/FLU/RSV assay is intended as an aid in  the diagnosis of influenza from Nasopharyngeal swab specimens and  should not be used as a sole basis for treatment. Nasal washings and  aspirates are unacceptable for Xpert Xpress SARS-CoV-2/FLU/RSV  testing. Fact Sheet for  Patients: PinkCheek.be Fact Sheet for Healthcare Providers: GravelBags.it This test is not yet approved or cleared by the [place] FDA and  has been authorized for detection and/or diagnosis of SARS-CoV-2 by  FDA under an Emergency Use Authorization (EUA). This EUA will remain  in effect (meaning this test can be used) for the duration of the  Covid-19 declaration under Section 564(b)(1) of the Act, 21  U.S.C. section 360bbb-3(b)(1), unless the authorization is  terminated or revoked. Performed at [facility], [place] [address] [place], [place] [address]          Radiology Studies: No results found.      Scheduled Meds: . aspirin EC  81 mg Oral Daily  . docusate sodium  50 mg Oral BID  . gabapentin  100 mg Oral BID WC  . gabapentin  200 mg Oral QHS  . heparin injection (subcutaneous)  5,000 Units Subcutaneous Q8H  . hydrocerin   Topical BID  . insulin aspart  0-15 Units Subcutaneous TID WC  . insulin aspart  0-5 Units Subcutaneous QHS  . multivitamin with minerals  1 tablet Oral Daily  . pantoprazole  40 mg Oral Daily   Continuous Infusions:   LOS: 7 days     Cordelia Poche, MD [facility] 11/26/2019, 1:54 PM  If 7PM-7AM, please contact night-coverage [email or web address]

## 2019-11-27 ENCOUNTER — Inpatient Hospital Stay (HOSPITAL_COMMUNITY): Payer: Medicare Other

## 2019-11-27 LAB — BASIC METABOLIC PANEL
Anion gap: 6 (ref 5–15)
Anion gap: 7 (ref 5–15)
BUN: 49 mg/dL — ABNORMAL HIGH (ref 8–23)
BUN: 43 mg/dL — ABNORMAL HIGH (ref 8–23)
CO2: 22 mmol/L (ref 22–32)
CO2: 23 mmol/L (ref 22–32)
Calcium: 8.1 mg/dL — ABNORMAL LOW (ref 8.9–10.3)
Calcium: 8.1 mg/dL — ABNORMAL LOW (ref 8.9–10.3)
Chloride: 108 mmol/L (ref 98–111)
Chloride: 108 mmol/L (ref 98–111)
Creatinine, Ser: 1.28 mg/dL — ABNORMAL HIGH (ref 0.44–1.00)
Creatinine, Ser: 1.17 mg/dL — ABNORMAL HIGH (ref 0.44–1.00)
GFR calc Af Amer: 44 mL/min — ABNORMAL LOW (ref >60)
GFR calc Af Amer: 49 mL/min — ABNORMAL LOW (ref >60)
GFR calc non Af Amer: 38 mL/min — ABNORMAL LOW (ref >60)
GFR calc non Af Amer: 42 mL/min — ABNORMAL LOW (ref >60)
Glucose, Bld: 152 mg/dL — ABNORMAL HIGH (ref 70–99)
Glucose, Bld: 151 mg/dL — ABNORMAL HIGH (ref 70–99)
Potassium: 6 mmol/L — ABNORMAL HIGH (ref 3.5–5.1)
Potassium: 4.2 mmol/L (ref 3.5–5.1)
Sodium: 136 mmol/L (ref 135–145)
Sodium: 138 mmol/L (ref 135–145)

## 2019-11-27 LAB — CREATININE, URINE, RANDOM: Creatinine, Urine: 78.41 mg/dL

## 2019-11-27 LAB — GLUCOSE, CAPILLARY
Glucose-Capillary: 146 mg/dL — ABNORMAL HIGH (ref 70–99)
Glucose-Capillary: 200 mg/dL — ABNORMAL HIGH (ref 70–99)
Glucose-Capillary: 136 mg/dL — ABNORMAL HIGH (ref 70–99)
Glucose-Capillary: 129 mg/dL — ABNORMAL HIGH (ref 70–99)

## 2019-11-27 LAB — SODIUM, URINE, RANDOM: Sodium, Ur: <10 mmol/L

## 2019-11-27 NOTE — Care Management Important Message (Signed)
Important Message  Patient Details IM Letter given to Evette Cristal SW Case Manager to present to the Patient Name: [name] MRN: [id number] Date of Birth: [birth year]   Medicare Important Message Given:  Yes     Kerin Salen 11/27/2019, 11:53 AM

## 2019-11-27 NOTE — Progress Notes (Signed)
PROGRESS NOTE    [name]  WNI:[id number] DOB: [birth year] DOA: 11/18/2019 PCP: Kerin Perna, NP   Brief Narrative: [name] is a 86 y.o. female with a history of CKD, morbid obesity, hypertension, bilateral lower extremity leg edema as well as osteoarthritis. Patient presented secondary to altered mental status.   Assessment & Plan:   Principal Problem:   Acute encephalopathy Active Problems:   Hypertension   Acute metabolic encephalopathy Concern for cerebral amyloid angiopathy from MRI. MRI also with evidence of vasogenic edema. Neurology consulted and recommended IV steroids x5 days. -Neurology recommendations: holding off on continued steroids secondary to patient age; consideration for outpatient neurology if patient's symptoms worsen. Also recommending repeat MRI brain in 2-4 weeks in addition to follow-up with stroke service, Dr. Leonie Man  Lower extremity edema Slightly improved. No evidence of heart failure. Worsened with IV fluids. Likely secondary to hypoalbuminemia.  Right arm pain/swelling Venous duplex negative for DVT.  Dyspnea Possibly position. Chest x-ray obtained and without acute process.  AKI on CKD stage III Improved with IV fluids initially and now worsening. Also with hyperkalemia today -Repeat BMP -Renal ultrasound -Urine sodium and creatinine -May need to restart fluids if unable to manage with oral intake  Obesity Body mass index is 32.58 kg/m.   DVT prophylaxis: Heparin subq Code Status:   Code Status: Full Code Family Communication: Daughter at bedside Disposition Plan: Discharge to SNF pending stabilization of kidney function, improvement of potassium   Consultants:   Neurology  Procedures:   None  Antimicrobials:  None    Subjective: No issues overnight.  Objective: Vitals:   11/26/19 1356 11/26/19 2013 11/27/19 0558 11/27/19 1449  BP: (!) 151/74 137/63 (!) 115/54 135/66  Pulse: 78 75 66 74  Resp: 16  20 18 16   Temp: (!) 97.5 F (36.4 C) 97.9 F (36.6 C) 97.7 F (36.5 C) 97.8 F (36.6 C)  TempSrc: Oral Oral Oral Oral  SpO2: 99% 99% 100% 100%  Weight:      Height:        Intake/Output Summary (Last 24 hours) at 11/27/2019 1615 Last data filed at 11/27/2019 1041 Gross per 24 hour  Intake --  Output 950 ml  Net -950 ml   Filed Weights   11/19/19 0200  Weight: 70.7 kg    Examination:  General exam: Appears calm and comfortable Respiratory system: Clear to auscultation. Respiratory effort normal. Cardiovascular system: S1 & S2 heard, RRR. Gastrointestinal system: Abdomen is nondistended, soft and nontender. No organomegaly or masses felt. Normal bowel sounds heard. Central nervous system: Alert and oriented. No focal neurological deficits. Extremities: Anasarca. No calf tenderness Skin: No cyanosis. No rashes Psychiatry: Judgement and insight appear normal. Mood & affect appropriate.      Data Reviewed: I have personally reviewed following labs and imaging studies  CBC: Recent Labs  Lab 11/22/19 0432 11/23/19 0534 11/24/19 0504 11/25/19 0447 11/26/19 0436  WBC 5.8 5.6 7.1 6.0 5.6  NEUTROABS 3.8 4.9 6.1 5.2 4.6  HGB 9.2* 10.8* 10.1* 8.8* 9.4*  HCT 30.7* 36.0 32.9* 29.1* 30.7*  MCV 85.5 87.0 83.7 84.6 83.7  PLT 181 228 257 280 381   Basic Metabolic Panel: Recent Labs  Lab 11/22/19 0432 11/22/19 0432 11/23/19 0534 11/24/19 0504 11/25/19 0447 11/26/19 0436 11/27/19 0826  NA 142   < > 139 139 136 138 136  K 4.7   < > 5.3* 5.3* 4.5 4.6 6.0*  CL 109   < > 105 106 107  108 108  CO2 24   < > 24 25 23 24 22   GLUCOSE 144*   < > 311* 152* 156* 177* 152*  BUN 29*   < > 39* 45* 44* 46* 49*  CREATININE 1.27*   < > 1.37* 1.28* 1.12* 1.19* 1.28*  CALCIUM 8.3*   < > 8.5* 8.5* 7.8* 8.1* 8.1*  MG 2.2  --  2.3 2.5* 2.4 2.6*  --   PHOS 2.2*  --  2.8 2.7 2.8 3.2  --    < > = values in this interval not displayed.   GFR: Estimated Creatinine Clearance: 25.8 mL/min (A)  (by C-G formula based on SCr of 1.28 mg/dL (H)). Liver Function Tests: Recent Labs  Lab 11/22/19 0432 11/23/19 0534 11/24/19 0504 11/25/19 0447 11/26/19 0436  AST 36 42* 74* 40 25  ALT 26 43 70* 56* 49*  ALKPHOS 58 89 76 72 75  BILITOT 0.7 0.4 0.5 0.1* 0.2*  PROT 5.5* 6.5 6.3* 5.3* 5.4*  ALBUMIN 2.2* 2.3* 2.2* 1.8* 1.9*   No results for input(s): LIPASE, AMYLASE in the last 168 hours. No results for input(s): AMMONIA in the last 168 hours. Coagulation Profile: No results for input(s): INR, PROTIME in the last 168 hours. Cardiac Enzymes: No results for input(s): CKTOTAL, CKMB, CKMBINDEX, TROPONINI in the last 168 hours. BNP (last 3 results) No results for input(s): PROBNP in the last 8760 hours. HbA1C: No results for input(s): HGBA1C in the last 72 hours. CBG: Recent Labs  Lab 11/26/19 1159 11/26/19 1646 11/26/19 2141 11/27/19 0825 11/27/19 1241  GLUCAP 176* 169* 217* 146* 200*   Lipid Profile: No results for input(s): CHOL, HDL, LDLCALC, TRIG, CHOLHDL, LDLDIRECT in the last 72 hours. Thyroid Function Tests: No results for input(s): TSH, T4TOTAL, FREET4, T3FREE, THYROIDAB in the last 72 hours. Anemia Panel: Recent Labs    11/25/19 0447  VITAMINB12 2,766*  FOLATE 18.5  FERRITIN 191  TIBC 213*  IRON 34  RETICCTPCT 1.2   Sepsis Labs: No results for input(s): PROCALCITON, LATICACIDVEN in the last 168 hours.  Recent Results (from the past 240 hour(s))  Blood culture (routine x 2)     Status: None   Collection Time: 11/18/19  1:48 PM   Specimen: BLOOD  Result Value Ref Range Status   Specimen Description   Final    BLOOD LEFT ANTECUBITAL Performed at [facility] [address] [place], [place] [address]    Special Requests   Final    BOTTLES DRAWN AEROBIC AND ANAEROBIC Blood Culture results may not be optimal due to an excessive volume of blood received in culture bottles Performed at [facility] [address], [place], [place] [address]    Culture   Final    NO GROWTH 5 DAYS Performed at [facility], [place] [address], [place], [place] [address]    Report Status 11/23/2019 FINAL  Final  Blood culture (routine x 2)     Status: None   Collection Time: 11/18/19  2:05 PM   Specimen: BLOOD  Result Value Ref Range Status   Specimen Description   Final    BLOOD RIGHT ANTECUBITAL Performed at [facility] [address] [place], [place] [address]    Special Requests   Final    BOTTLES DRAWN AEROBIC ONLY Blood Culture results may not be optimal due to an inadequate volume of blood received in culture bottles Performed at [facility]  [address] [place], [place] [address]    Culture   Final    NO GROWTH 5 DAYS Performed at [facility], [place] [address], [place], [place] [address]    Report Status 11/23/2019 FINAL  Final  Urine culture     Status: Abnormal   Collection Time: 11/18/19  2:31 PM   Specimen: In/Out Cath Urine  Result Value Ref Range Status   Specimen Description   Final    IN/OUT CATH URINE Performed at [facility] [address], [place], [place] [address]    Special Requests   Final    NONE Performed at [facility], [place] [address], [place], [place] [address]    Culture (A)  Final    5,000 COLONIES/mL GROUP B STREP(S.AGALACTIAE)ISOLATED TESTING AGAINST S. AGALACTIAE NOT ROUTINELY PERFORMED DUE TO PREDICTABILITY OF AMP/PEN/VAN SUSCEPTIBILITY. Performed at [facility], [place] [address], [place], [place] [address]    Report Status 11/20/2019 FINAL  Final  SARS CORONAVIRUS 2 (TAT 6-24 HRS) Nasopharyngeal Nasopharyngeal Swab     Status: None   Collection Time: 11/18/19 11:09 PM   Specimen: Nasopharyngeal Swab  Result Value Ref Range Status   SARS Coronavirus 2 NEGATIVE NEGATIVE Final    Comment: (NOTE) SARS-CoV-2 target nucleic acids are NOT DETECTED. The SARS-CoV-2 RNA is  generally detectable in upper and lower respiratory specimens during the acute phase of infection. Negative results do not preclude SARS-CoV-2 infection, do not rule out co-infections with other pathogens, and should not be used as the sole basis for treatment or other patient management decisions. Negative results must be combined with clinical observations, patient history, and epidemiological information. The expected result is Negative. Fact Sheet for Patients: SugarRoll.be Fact Sheet for Healthcare Providers: https://www.woods-mathews.com/ This test is not yet approved or cleared by the [place] FDA and  has been authorized for detection and/or diagnosis of SARS-CoV-2 by FDA under an Emergency Use Authorization (EUA). This EUA will remain  in effect (meaning this test can be used) for the duration of the COVID-19 declaration under Section 56 4(b)(1) of the Act, 21 U.S.C. section 360bbb-3(b)(1), unless the authorization is terminated or revoked sooner. Performed at [facility], [place] [address] [place], [place] [address]   CSF culture     Status: None   Collection Time: 11/21/19  3:03 PM   Specimen: PATH Cytology CSF; Cerebrospinal Fluid  Result Value Ref Range Status   Specimen Description   Final    CSF Performed at [facility] [address], [place], [place] [address]    Special Requests   Final    NONE Performed at [facility], [place] [address], [place], [place] [address]    Gram Stain   Final    NO WBC SEEN NO ORGANISMS SEEN CYTOSPIN SMEAR Gram Stain Report Called to,Read Back By and Verified With: S.ADAMSON AT 1805 ON 11/21/19 BY N.THOMPSON Performed at [facility], [place] [address] [place], [place] [address]    Culture   Final    NO GROWTH 3 DAYS Performed at [facility], [place] [address] [place], [place] [address]    Report Status 11/25/2019 FINAL   Final  Respiratory Panel by RT PCR (Flu A&B, Covid) - Nasopharyngeal Swab     Status: None   Collection Time: 11/24/19  6:13 PM   Specimen: Nasopharyngeal Swab  Result Value Ref Range Status   SARS Coronavirus 2 by RT PCR NEGATIVE  NEGATIVE Final    Comment: (NOTE) SARS-CoV-2 target nucleic acids are NOT DETECTED. The SARS-CoV-2 RNA is generally detectable in upper respiratoy specimens during the acute phase of infection. The lowest concentration of SARS-CoV-2 viral copies this assay can detect is 131 copies/mL. A negative result does not preclude SARS-Cov-2 infection and should not be used as the sole basis for treatment or other patient management decisions. A negative result may occur with  improper specimen collection/handling, submission of specimen other than nasopharyngeal swab, presence of viral mutation(s) within the areas targeted by this assay, and inadequate number of viral copies (<131 copies/mL). A negative result must be combined with clinical observations, patient history, and epidemiological information. The expected result is Negative. Fact Sheet for Patients:  PinkCheek.be Fact Sheet for Healthcare Providers:  GravelBags.it This test is not yet ap proved or cleared by the [place] FDA and  has been authorized for detection and/or diagnosis of SARS-CoV-2 by FDA under an Emergency Use Authorization (EUA). This EUA will remain  in effect (meaning this test can be used) for the duration of the COVID-19 declaration under Section 564(b)(1) of the Act, 21 U.S.C. section 360bbb-3(b)(1), unless the authorization is terminated or revoked sooner.    Influenza A by PCR NEGATIVE NEGATIVE Final   Influenza B by PCR NEGATIVE NEGATIVE Final    Comment: (NOTE) The Xpert Xpress SARS-CoV-2/FLU/RSV assay is intended as an aid in  the diagnosis of influenza from Nasopharyngeal swab specimens and  should not be used as a  sole basis for treatment. Nasal washings and  aspirates are unacceptable for Xpert Xpress SARS-CoV-2/FLU/RSV  testing. Fact Sheet for Patients: PinkCheek.be Fact Sheet for Healthcare Providers: GravelBags.it This test is not yet approved or cleared by the [place] FDA and  has been authorized for detection and/or diagnosis of SARS-CoV-2 by  FDA under an Emergency Use Authorization (EUA). This EUA will remain  in effect (meaning this test can be used) for the duration of the  Covid-19 declaration under Section 564(b)(1) of the Act, 21  U.S.C. section 360bbb-3(b)(1), unless the authorization is  terminated or revoked. Performed at [facility], [place] [address] [place], [place] [address]          Radiology Studies: DG CHEST PORT 1 VIEW  Result Date: 11/26/2019 CLINICAL DATA:  Altered mental status. EXAM: PORTABLE CHEST 1 VIEW COMPARISON:  Chest radiograph 11/18/2019. FINDINGS: Monitoring leads overlie the patient. Stable enlarged cardiac and mediastinal contours. Aortic atherosclerosis. Pulmonary arterial enlargement. Elevation right hemidiaphragm. No large area pulmonary consolidation. No pleural effusion or pneumothorax. Thoracic spine degenerative changes. IMPRESSION: No acute cardiopulmonary process. Electronically Signed   By: Lovey Newcomer M.D.   On: 11/26/2019 15:01        Scheduled Meds: . aspirin EC  81 mg Oral Daily  . docusate sodium  50 mg Oral BID  . gabapentin  100 mg Oral BID WC  . gabapentin  200 mg Oral QHS  . heparin injection (subcutaneous)  5,000 Units Subcutaneous Q8H  . hydrocerin   Topical BID  . insulin aspart  0-15 Units Subcutaneous TID WC  . insulin aspart  0-5 Units Subcutaneous QHS  . multivitamin with minerals  1 tablet Oral Daily  . pantoprazole  40 mg Oral Daily   Continuous Infusions:   LOS: 8 days     Cordelia Poche, MD [facility] 11/27/2019, 4:15  PM  If 7PM-7AM, please contact night-coverage [email or web address]

## 2019-11-27 NOTE — Progress Notes (Signed)
Spiritual care consulted with Social Work regarding HCPOA request.  Due to call volume, we were unable to see this request this afternoon.  Building services engineer will be in house tomorrow afternoon.  This chaplain will follow for questions and HCPOA paperwork tomorrow AM with goal of notarizing paperwork tomorrow PM.    Jerene Pitch, MDiv, [place], [place]

## 2019-11-27 NOTE — Progress Notes (Signed)
Physical Therapy Treatment Patient Details Name: [name] MRN: [id number] DOB: Mar 27, 1930 Today's Date: 11/27/2019    History of Present Illness 86 y.o. female with history of hypertension chronic lower extremity edema was brought to the ER after patient's daughter found the patient was confused. 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 No evidence of metastatic disease.    PT Comments    Patient is making slow progress with therapy. She was able to perform bed mobility with min assist and continues to require increased time for sequencing and initiating movements. Patient required mod assist +2 for sit<>stand with RW to maintain stability and assist with initiating power up. She maintains flexed trunk posture in standing and cues required for posture throughout session. Pt unable to take sufficient side steps with RW to transfer to chair this date. Stedy transfer performed and pt able to pull up with bar and required min assist at hips to complete rise. She ended session resting in chair with tray table and apple pie in front of her. Patient demonstrated ability to feed herself. Acute PT will continue to follow and progress mobility as able, continue to recommend SNF level follow up for therapy.    Follow Up Recommendations  SNF     Equipment Recommendations  Wheelchair cushion (measurements PT);Wheelchair (measurements PT)    Recommendations for Other Services       Precautions / Restrictions Precautions Precautions: Fall Restrictions Weight Bearing Restrictions: No    Mobility  Bed Mobility Overal bed mobility: Needs Assistance Bed Mobility: Supine to Sit     Supine to sit: HOB elevated;Min assist;Mod assist     General bed mobility comments: pt requries min-mod assist to reach UE to bed rail and walk LE's to EOB. pt requried significant increased time but able to sequence and initiate  movements.  Transfers Overall transfer level: Needs assistance Equipment used: Rolling walker (2 wheeled) Transfers: Sit to/from [facility] Sit to Stand: Mod assist;From elevated surface;+2 physical assistance;+2 safety/equipment Stand pivot transfers: From elevated surface;+2 safety/equipment;+2 physical assistance;Total assist       General transfer comment: pt performed sit<>stand from EOB wtih RW 1x and mod assist +2 provided to initaite stand and complete rise. Pt required repeated cues to attempt to step to Lt and move to recliner. Pt fatigued and required seated rest EOB. Sit<>stand performed with Stedy and pt requiring mod assist to stand and allow paddles to be positioned behind her. Total assist with Stedy to move to recliner.  Ambulation/Gait        Stairs      Wheelchair Mobility    Modified Rankin (Stroke Patients Only)       Balance Overall balance assessment: Needs assistance;History of Falls Sitting-balance support: Feet supported;Bilateral upper extremity supported Sitting balance-Leahy Scale: Poor Sitting balance - Comments: relies on single UE support   Standing balance support: Bilateral upper extremity supported Standing balance-Leahy Scale: Poor       Cognition Arousal/Alertness: Awake/alert Behavior During Therapy: WFL for tasks assessed/performed Overall Cognitive Status: Impaired/Different from baseline Area of Impairment: Following commands;Problem solving        Following Commands: Follows one step commands consistently     Problem Solving: Slow processing;Decreased initiation General Comments: Improving ability to follow commands. Continues to need increased time to initiate tasks and answer questions.      Exercises      General Comments        Pertinent Vitals/Pain  Pain Assessment: No/denies pain           PT Goals (current goals can now be found in the care plan section) Acute Rehab PT Goals Patient  Stated Goal: to get stronger, be able to walk PT Goal Formulation: With patient/family Time For Goal Achievement: 12/03/19 Potential to Achieve Goals: Fair Progress towards PT goals: Not progressing toward goals - comment(pt requried increased assist for bed to chair transfer)    Frequency    Min 2X/week      PT Plan Current plan remains appropriate    Co-evaluation        AM-PAC PT "6 Clicks" Mobility   Outcome Measure  Help needed turning from your back to your side while in a flat bed without using bedrails?: A Little Help needed moving from lying on your back to sitting on the side of a flat bed without using bedrails?: A Lot Help needed moving to and from a bed to a chair (including a wheelchair)?: Total Help needed standing up from a chair using your arms (e.g., wheelchair or bedside chair)?: A Lot Help needed to walk in hospital room?: A Lot Help needed climbing 3-5 steps with a railing? : Total 6 Click Score: 11    End of Session Equipment Utilized During Treatment: Gait belt Activity Tolerance: Patient tolerated treatment well Patient left: in chair;with call bell/phone within reach;with chair alarm set Nurse Communication: Mobility status;Need for lift equipment PT Visit Diagnosis: Difficulty in walking, not elsewhere classified (R26.2);Muscle weakness (generalized) (M62.81);Other abnormalities of gait and mobility (R26.89);History of falling (Z91.81)     Time: 1630-1701 PT Time Calculation (min) (ACUTE ONLY): 31 min  Charges:  $Therapeutic Activity: 23-37 mins                     Verner Mould, DPT Physical Therapist with [facility] [phone]  11/27/2019 5:21 PM

## 2019-11-27 NOTE — TOC Progression Note (Addendum)
Transition of Care [facility]) - Progression Note    Patient Details  Name: [name] MRN: [id number] Date of Birth: February 09, 1930  Transition of Care [facility] CM/SW Contact  Ross Ludwig, [place] Phone Number: 11/27/2019, 11:06 AM  Clinical Narrative:     CSW was informed that patient is not medically ready for discharge today.  CSW updated [facility], and [facility].  Insurance company stated that patient's authorization will have to be restarted.  CSW to restart insurance authorization.  CSW updated patient's family that she still has a bed available for SNF at [facility] and she will be going to rehab once she is medically ready, and insurance approves again.  4:30pm  CSW spoke to patient's daughter, she was asking if CSW was able to speak to the Chaplain about HCPOA paperwork, CSW informed her that a consult was put in for them to see her.  CSW spoke to chaplain and he said they are unable to see patient and her daughter today, but will try to see her tomorrow.  CSW to continue to follow patient's progress throughout discharge planning.   Expected Discharge Plan: [place] Barriers to Discharge: Continued Medical Work up  Expected Discharge Plan and Services Expected Discharge Plan: [place]   Discharge Planning Services: CM Consult   Living arrangements for the past 2 months: Single Family Home                                       Social Determinants of Health (SDOH) Interventions    Readmission Risk Interventions No flowsheet data found.

## 2019-11-28 ENCOUNTER — Inpatient Hospital Stay (HOSPITAL_COMMUNITY): Payer: Medicare Other

## 2019-11-28 LAB — BASIC METABOLIC PANEL
Anion gap: 4 — ABNORMAL LOW (ref 5–15)
BUN: 48 mg/dL — ABNORMAL HIGH (ref 8–23)
CO2: 24 mmol/L (ref 22–32)
Calcium: 8 mg/dL — ABNORMAL LOW (ref 8.9–10.3)
Chloride: 110 mmol/L (ref 98–111)
Creatinine, Ser: 1.31 mg/dL — ABNORMAL HIGH (ref 0.44–1.00)
GFR calc Af Amer: 42 mL/min — ABNORMAL LOW (ref >60)
GFR calc non Af Amer: 37 mL/min — ABNORMAL LOW (ref >60)
Glucose, Bld: 113 mg/dL — ABNORMAL HIGH (ref 70–99)
Potassium: 4.5 mmol/L (ref 3.5–5.1)
Sodium: 138 mmol/L (ref 135–145)

## 2019-11-28 LAB — SARS CORONAVIRUS 2 (TAT 6-24 HRS): SARS Coronavirus 2: NEGATIVE

## 2019-11-28 LAB — CBC
HCT: 31.4 % — ABNORMAL LOW (ref 36.0–46.0)
Hemoglobin: 9.6 g/dL — ABNORMAL LOW (ref 12.0–15.0)
MCH: 26.1 pg (ref 26.0–34.0)
MCHC: 30.6 g/dL (ref 30.0–36.0)
MCV: 85.3 fL (ref 80.0–100.0)
Platelets: 391 10*3/uL (ref 150–400)
RBC: 3.68 MIL/uL — ABNORMAL LOW (ref 3.87–5.11)
RDW: 15.9 % — ABNORMAL HIGH (ref 11.5–15.5)
WBC: 7.9 10*3/uL (ref 4.0–10.5)
nRBC: 0.5 % — ABNORMAL HIGH (ref 0.0–0.2)

## 2019-11-28 LAB — GLUCOSE, CAPILLARY
Glucose-Capillary: 78 mg/dL (ref 70–99)
Glucose-Capillary: 145 mg/dL — ABNORMAL HIGH (ref 70–99)
Glucose-Capillary: 102 mg/dL — ABNORMAL HIGH (ref 70–99)
Glucose-Capillary: 106 mg/dL — ABNORMAL HIGH (ref 70–99)

## 2019-11-28 MED ORDER — POLYETHYLENE GLYCOL 3350 17 G PO PACK
17.0000 g | PACK | Freq: Every day | ORAL | Status: DC
  Administered 2019-11-28 – 2019-11-29 (×2): 17 g via ORAL
  Filled 2019-11-28 (×2): qty 1

## 2019-11-28 NOTE — Progress Notes (Signed)
PROGRESS NOTE    [name]  XBD:[id number] DOB: 09-Jan-1930 DOA: 11/18/2019 PCP: Kerin Perna, NP   Brief Narrative: [name] is a 86 y.o. female with a history of CKD, morbid obesity, hypertension, bilateral lower extremity leg edema as well as osteoarthritis. Patient presented secondary to altered mental status.   Assessment & Plan:   Principal Problem:   Acute encephalopathy Active Problems:   Hypertension   Acute metabolic encephalopathy Concern for cerebral amyloid angiopathy from MRI. MRI also with evidence of vasogenic edema. Neurology consulted and recommended IV steroids x5 days. -Neurology recommendations: holding off on continued steroids secondary to patient age; consideration for outpatient neurology if patient's symptoms worsen. Also recommending repeat MRI brain in 2-4 weeks in addition to follow-up with stroke service, Dr. Leonie Man  Lower extremity edema Slightly improved. No evidence of heart failure. Worsened with IV fluids. Likely secondary to hypoalbuminemia.  Right arm pain/swelling Venous duplex negative for DVT.  Dyspnea Possibly position. Chest x-ray obtained and without acute process.  AKI on CKD stage III Improved with IV fluids initially. Renal ultrasound shows small sized kidneys with signs of atrophy. Creatine increased today -Continue to observe urine output -Will need outpatient nephrology follow-up  Constipation -Abdominal x-ray; if no obstruction, will start bowel regimen  Obesity Body mass index is 32.58 kg/m.   DVT prophylaxis: Heparin subq Code Status:   Code Status: Full Code Family Communication: Daughter at bedside Disposition Plan: Discharge to SNF in AM if creatinine remains stable and pending abdominal x-ray. Would like to ensure stability prior to transfer to SNF to decrease risk of patient needing to return to the hospital for inpatient management   Consultants:   Neurology  Procedures:    None  Antimicrobials:  None    Subjective: No bowel movement. No other concerns.  Objective: Vitals:   11/27/19 1449 11/27/19 2217 11/28/19 0725 11/28/19 0822  BP: 135/66 138/64 (!) 106/56 (!) 113/51  Pulse: 74 75 68 68  Resp: 16 18 20 18   Temp: 97.8 F (36.6 C) 98 F (36.7 C) (!) 97.5 F (36.4 C) (!) 97.3 F (36.3 C)  TempSrc: Oral  Oral Oral  SpO2: 100% 98% 100% 100%  Weight:      Height:        Intake/Output Summary (Last 24 hours) at 11/28/2019 1441 Last data filed at 11/28/2019 0954 Gross per 24 hour  Intake 600 ml  Output 800 ml  Net -200 ml   Filed Weights   11/19/19 0200  Weight: 70.7 kg    Examination:  General exam: Appears calm and comfortable Respiratory system: Clear to auscultation. Respiratory effort normal. Cardiovascular system: S1 & S2 heard, RRR. No murmurs, rubs, gallops or clicks. Gastrointestinal system: Abdomen is distended, soft and nontender. No organomegaly or masses felt. Normal bowel sounds heard. Central nervous system: Alert and oriented. Extremities: Anasarca. No calf tenderness Skin: No cyanosis. No rashes Psychiatry: Judgement and insight appear normal. Mood & affect appropriate.       Data Reviewed: I have personally reviewed following labs and imaging studies  CBC: Recent Labs  Lab 11/22/19 0432 11/22/19 0432 11/23/19 0534 11/24/19 0504 11/25/19 0447 11/26/19 0436 11/28/19 0427  WBC 5.8   < > 5.6 7.1 6.0 5.6 7.9  NEUTROABS 3.8  --  4.9 6.1 5.2 4.6  --   HGB 9.2*   < > 10.8* 10.1* 8.8* 9.4* 9.6*  HCT 30.7*   < > 36.0 32.9* 29.1* 30.7* 31.4*  MCV 85.5   < >  87.0 83.7 84.6 83.7 85.3  PLT 181   < > 228 257 280 314 391   < > = values in this interval not displayed.   Basic Metabolic Panel: Recent Labs  Lab 11/22/19 0432 11/22/19 0432 11/23/19 0534 11/23/19 0534 11/24/19 0504 11/24/19 0504 11/25/19 0447 11/26/19 0436 11/27/19 0826 11/27/19 1633 11/28/19 0427  NA 142   < > 139   < > 139   < > 136 138  136 138 138  K 4.7   < > 5.3*   < > 5.3*   < > 4.5 4.6 6.0* 4.2 4.5  CL 109   < > 105   < > 106   < > 107 108 108 108 110  CO2 24   < > 24   < > 25   < > 23 24 22 23 24   GLUCOSE 144*   < > 311*   < > 152*   < > 156* 177* 152* 151* 113*  BUN 29*   < > 39*   < > 45*   < > 44* 46* 49* 43* 48*  CREATININE 1.27*   < > 1.37*   < > 1.28*   < > 1.12* 1.19* 1.28* 1.17* 1.31*  CALCIUM 8.3*   < > 8.5*   < > 8.5*   < > 7.8* 8.1* 8.1* 8.1* 8.0*  MG 2.2  --  2.3  --  2.5*  --  2.4 2.6*  --   --   --   PHOS 2.2*  --  2.8  --  2.7  --  2.8 3.2  --   --   --    < > = values in this interval not displayed.   GFR: Estimated Creatinine Clearance: 25.2 mL/min (A) (by C-G formula based on SCr of 1.31 mg/dL (H)). Liver Function Tests: Recent Labs  Lab 11/22/19 0432 11/23/19 0534 11/24/19 0504 11/25/19 0447 11/26/19 0436  AST 36 42* 74* 40 25  ALT 26 43 70* 56* 49*  ALKPHOS 58 89 76 72 75  BILITOT 0.7 0.4 0.5 0.1* 0.2*  PROT 5.5* 6.5 6.3* 5.3* 5.4*  ALBUMIN 2.2* 2.3* 2.2* 1.8* 1.9*   No results for input(s): LIPASE, AMYLASE in the last 168 hours. No results for input(s): AMMONIA in the last 168 hours. Coagulation Profile: No results for input(s): INR, PROTIME in the last 168 hours. Cardiac Enzymes: No results for input(s): CKTOTAL, CKMB, CKMBINDEX, TROPONINI in the last 168 hours. BNP (last 3 results) No results for input(s): PROBNP in the last 8760 hours. HbA1C: No results for input(s): HGBA1C in the last 72 hours. CBG: Recent Labs  Lab 11/27/19 1241 11/27/19 1632 11/27/19 2220 11/28/19 0738 11/28/19 1156  GLUCAP 200* 136* 129* 78 145*   Lipid Profile: No results for input(s): CHOL, HDL, LDLCALC, TRIG, CHOLHDL, LDLDIRECT in the last 72 hours. Thyroid Function Tests: No results for input(s): TSH, T4TOTAL, FREET4, T3FREE, THYROIDAB in the last 72 hours. Anemia Panel: No results for input(s): VITAMINB12, FOLATE, FERRITIN, TIBC, IRON, RETICCTPCT in the last 72 hours. Sepsis Labs: No  results for input(s): PROCALCITON, LATICACIDVEN in the last 168 hours.  Recent Results (from the past 240 hour(s))  SARS CORONAVIRUS 2 (TAT 6-24 HRS) Nasopharyngeal Nasopharyngeal Swab     Status: None   Collection Time: 11/18/19 11:09 PM   Specimen: Nasopharyngeal Swab  Result Value Ref Range Status   SARS Coronavirus 2 NEGATIVE NEGATIVE Final    Comment: (NOTE) SARS-CoV-2 target nucleic acids are  NOT DETECTED. The SARS-CoV-2 RNA is generally detectable in upper and lower respiratory specimens during the acute phase of infection. Negative results do not preclude SARS-CoV-2 infection, do not rule out co-infections with other pathogens, and should not be used as the sole basis for treatment or other patient management decisions. Negative results must be combined with clinical observations, patient history, and epidemiological information. The expected result is Negative. Fact Sheet for Patients: SugarRoll.be Fact Sheet for Healthcare Providers: https://www.woods-mathews.com/ This test is not yet approved or cleared by the [place] FDA and  has been authorized for detection and/or diagnosis of SARS-CoV-2 by FDA under an Emergency Use Authorization (EUA). This EUA will remain  in effect (meaning this test can be used) for the duration of the COVID-19 declaration under Section 56 4(b)(1) of the Act, 21 U.S.C. section 360bbb-3(b)(1), unless the authorization is terminated or revoked sooner. Performed at [facility], [place] [address], [place], [place] [address]   CSF culture     Status: None   Collection Time: 11/21/19  3:03 PM   Specimen: PATH Cytology CSF; Cerebrospinal Fluid  Result Value Ref Range Status   Specimen Description   Final    CSF Performed at [facility] [address] [place], [place] [address]    Special Requests   Final    NONE Performed at [facility], [place] [address] [place], [place] [address]    Gram Stain   Final    NO WBC SEEN NO ORGANISMS SEEN CYTOSPIN SMEAR Gram Stain Report Called to,Read Back By and Verified With: S.ADAMSON AT 1805 ON 11/21/19 BY N.THOMPSON Performed at [facility], [place] [address], [place], [place] [address]    Culture   Final    NO GROWTH 3 DAYS Performed at [facility], [place] [address] [place], [place] [address]    Report Status 11/25/2019 FINAL  Final  Respiratory Panel by RT PCR (Flu A&B, Covid) - Nasopharyngeal Swab     Status: None   Collection Time: 11/24/19  6:13 PM   Specimen: Nasopharyngeal Swab  Result Value Ref Range Status   SARS Coronavirus 2 by RT PCR NEGATIVE NEGATIVE Final    Comment: (NOTE) SARS-CoV-2 target nucleic acids are NOT DETECTED. The SARS-CoV-2 RNA is generally detectable in upper respiratoy specimens during the acute phase of infection. The lowest concentration of SARS-CoV-2 viral copies this assay can detect is 131 copies/mL. A negative result does not preclude SARS-Cov-2 infection and should not be used as the sole basis for treatment or other patient management decisions. A negative result may occur with  improper specimen collection/handling, submission of specimen other than nasopharyngeal swab, presence of viral mutation(s) within the areas targeted by this assay, and inadequate number of viral copies (<131 copies/mL). A negative result must be combined with clinical observations, patient history, and epidemiological information. The expected result is Negative. Fact Sheet for Patients:  PinkCheek.be Fact Sheet for Healthcare Providers:  GravelBags.it This test is not yet ap proved or cleared by the [place] FDA and  has been authorized for detection and/or diagnosis of SARS-CoV-2 by FDA under an Emergency Use Authorization (EUA). This EUA will remain  in effect (meaning this test can be used)  for the duration of the COVID-19 declaration under Section 564(b)(1) of the Act, 21 U.S.C. section 360bbb-3(b)(1), unless the authorization is terminated or revoked sooner.    Influenza A by PCR NEGATIVE NEGATIVE Final   Influenza B by  PCR NEGATIVE NEGATIVE Final    Comment: (NOTE) The Xpert Xpress SARS-CoV-2/FLU/RSV assay is intended as an aid in  the diagnosis of influenza from Nasopharyngeal swab specimens and  should not be used as a sole basis for treatment. Nasal washings and  aspirates are unacceptable for Xpert Xpress SARS-CoV-2/FLU/RSV  testing. Fact Sheet for Patients: PinkCheek.be Fact Sheet for Healthcare Providers: GravelBags.it This test is not yet approved or cleared by the [place] FDA and  has been authorized for detection and/or diagnosis of SARS-CoV-2 by  FDA under an Emergency Use Authorization (EUA). This EUA will remain  in effect (meaning this test can be used) for the duration of the  Covid-19 declaration under Section 564(b)(1) of the Act, 21  U.S.C. section 360bbb-3(b)(1), unless the authorization is  terminated or revoked. Performed at [facility], [place] [address] [place], [place] [address]          Radiology Studies: US RENAL  Result Date: 11/27/2019 CLINICAL DATA:  Acute kidney injury EXAM: RENAL / URINARY TRACT ULTRASOUND COMPLETE COMPARISON:  Ultrasound 12/16/2003 FINDINGS: Right Kidney: Renal measurements: 6.1 by 3.1 x 4.3 cm = volume: 42.3 mL . Echogenicity within normal limits. No mass or hydronephrosis visualized. Left Kidney: Renal measurements: 8.2 x 4.5 x 5.6 cm = volume: 105.4 mL. Echogenicity within normal limits. No mass or hydronephrosis visualized. Bladder: Appears normal for degree of bladder distention. Other: None. IMPRESSION: Somewhat small size of the kidneys, right renal length smaller than the left, suggesting a component of atrophy. There is no  hydronephrosis. Electronically Signed   By: Donavan Foil M.D.   On: 11/27/2019 19:42        Scheduled Meds: . aspirin EC  81 mg Oral Daily  . docusate sodium  50 mg Oral BID  . gabapentin  100 mg Oral BID WC  . gabapentin  200 mg Oral QHS  . heparin injection (subcutaneous)  5,000 Units Subcutaneous Q8H  . hydrocerin   Topical BID  . insulin aspart  0-15 Units Subcutaneous TID WC  . insulin aspart  0-5 Units Subcutaneous QHS  . multivitamin with minerals  1 tablet Oral Daily  . pantoprazole  40 mg Oral Daily   Continuous Infusions:   LOS: 9 days     Cordelia Poche, MD [facility] 11/28/2019, 2:41 PM  If 7PM-7AM, please contact night-coverage [email or web address]

## 2019-11-28 NOTE — TOC Progression Note (Addendum)
Transition of Care [facility]) - Progression Note    Patient Details  Name: [name] MRN: [id number] Date of Birth: January 17, 1930  [facility] CM/SW Contact  Ross Ludwig, [place] Phone Number: 11/28/2019, 9:30 AM  Clinical Narrative:     CSW faxed updated clinicals to patient's insurance company for insurance authorization.  CSW awaiting insurance approval for patienet to go to [facility].  CSW to continue to follow patient's progress throughout discharge planning.  CSW spoke to [employer], and have received insurance authorization for her to go today.  Auth number is [id number], approved for 5 days, next review 12/02/19.   Expected Discharge Plan: [place] Barriers to Discharge: Continued Medical Work up  Expected Discharge Plan and Services Expected Discharge Plan: [place]   Discharge Planning Services: CM Consult   Living arrangements for the past 2 months: Single Family Home                                       Social Determinants of Health (SDOH) Interventions    Readmission Risk Interventions No flowsheet data found.

## 2019-11-28 NOTE — Progress Notes (Signed)
   11/28/19 1600  Clinical Encounter Type  Visited With Patient;Family  Visit Type Initial;Psychological support;Spiritual support  Referral From Nurse  Consult/Referral To Chaplain  Spiritual Encounters  Spiritual Needs Brochure;Other (Comment) (Advance Directive )  Stress Factors  Patient Stress Factors Health changes;Other (Comment) (Advance Directive )  Family Stress Factors Health changes;Other (Comment) (Advance Directive )  Advance Directives (For Healthcare)  Does Patient Have a Medical Advance Directive? No  Would patient like information on creating a medical advance directive? Yes (Inpatient - patient requests chaplain consult to create a medical advance directive) (See note )   I visited with [name] per spiritual care consult to discuss an Advance Directive. [name] said that she didn't feel good today, but remains hopeful that she will get better. She says that she wants to be "here for a long time." I provided her with a copy of the Advance Directive paperwork and she reviewed it, but did not wish to complete the document today.  I spoke with her daughter, [name], who stated that she wants to complete the document so that she and her oldest daughter can female [name] healthcare decisions in the event that she is ever not able to. I explained that if she is discharged before our spiritual care team can notarize the document that she can get it notarized at any bank or possibly the SNF. She understood.   Please, contact Spiritual Care for further assistance.   Fletcher M.Div. [place]

## 2019-11-29 DIAGNOSIS — I890 Lymphedema, not elsewhere classified: Secondary | ICD-10-CM | POA: Diagnosis not present

## 2019-11-29 DIAGNOSIS — E8809 Other disorders of plasma-protein metabolism, not elsewhere classified: Secondary | ICD-10-CM | POA: Diagnosis not present

## 2019-11-29 DIAGNOSIS — M2550 Pain in unspecified joint: Secondary | ICD-10-CM | POA: Diagnosis not present

## 2019-11-29 DIAGNOSIS — I739 Peripheral vascular disease, unspecified: Secondary | ICD-10-CM | POA: Diagnosis not present

## 2019-11-29 DIAGNOSIS — J069 Acute upper respiratory infection, unspecified: Secondary | ICD-10-CM | POA: Diagnosis not present

## 2019-11-29 DIAGNOSIS — M6281 Muscle weakness (generalized): Secondary | ICD-10-CM | POA: Diagnosis not present

## 2019-11-29 DIAGNOSIS — R609 Edema, unspecified: Secondary | ICD-10-CM | POA: Diagnosis not present

## 2019-11-29 DIAGNOSIS — I69828 Other speech and language deficits following other cerebrovascular disease: Secondary | ICD-10-CM | POA: Diagnosis not present

## 2019-11-29 DIAGNOSIS — G9340 Encephalopathy, unspecified: Secondary | ICD-10-CM | POA: Diagnosis not present

## 2019-11-29 DIAGNOSIS — G9341 Metabolic encephalopathy: Secondary | ICD-10-CM | POA: Diagnosis not present

## 2019-11-29 DIAGNOSIS — E872 Acidosis: Secondary | ICD-10-CM | POA: Diagnosis not present

## 2019-11-29 DIAGNOSIS — G629 Polyneuropathy, unspecified: Secondary | ICD-10-CM | POA: Diagnosis not present

## 2019-11-29 DIAGNOSIS — R2681 Unsteadiness on feet: Secondary | ICD-10-CM | POA: Diagnosis not present

## 2019-11-29 DIAGNOSIS — E854 Organ-limited amyloidosis: Secondary | ICD-10-CM | POA: Diagnosis not present

## 2019-11-29 DIAGNOSIS — I10 Essential (primary) hypertension: Secondary | ICD-10-CM | POA: Diagnosis not present

## 2019-11-29 DIAGNOSIS — I340 Nonrheumatic mitral (valve) insufficiency: Secondary | ICD-10-CM | POA: Diagnosis not present

## 2019-11-29 DIAGNOSIS — R600 Localized edema: Secondary | ICD-10-CM | POA: Diagnosis not present

## 2019-11-29 DIAGNOSIS — R0600 Dyspnea, unspecified: Secondary | ICD-10-CM | POA: Diagnosis not present

## 2019-11-29 DIAGNOSIS — R2689 Other abnormalities of gait and mobility: Secondary | ICD-10-CM | POA: Diagnosis not present

## 2019-11-29 DIAGNOSIS — R410 Disorientation, unspecified: Secondary | ICD-10-CM | POA: Diagnosis not present

## 2019-11-29 DIAGNOSIS — D631 Anemia in chronic kidney disease: Secondary | ICD-10-CM | POA: Diagnosis not present

## 2019-11-29 DIAGNOSIS — N179 Acute kidney failure, unspecified: Secondary | ICD-10-CM | POA: Diagnosis not present

## 2019-11-29 DIAGNOSIS — N183 Chronic kidney disease, stage 3 unspecified: Secondary | ICD-10-CM | POA: Diagnosis not present

## 2019-11-29 DIAGNOSIS — R601 Generalized edema: Secondary | ICD-10-CM

## 2019-11-29 DIAGNOSIS — I680 Cerebral amyloid angiopathy: Secondary | ICD-10-CM | POA: Diagnosis not present

## 2019-11-29 DIAGNOSIS — D649 Anemia, unspecified: Secondary | ICD-10-CM | POA: Diagnosis not present

## 2019-11-29 DIAGNOSIS — M4722 Other spondylosis with radiculopathy, cervical region: Secondary | ICD-10-CM | POA: Diagnosis not present

## 2019-11-29 DIAGNOSIS — Z743 Need for continuous supervision: Secondary | ICD-10-CM | POA: Diagnosis not present

## 2019-11-29 DIAGNOSIS — I129 Hypertensive chronic kidney disease with stage 1 through stage 4 chronic kidney disease, or unspecified chronic kidney disease: Secondary | ICD-10-CM | POA: Diagnosis not present

## 2019-11-29 DIAGNOSIS — R9089 Other abnormal findings on diagnostic imaging of central nervous system: Secondary | ICD-10-CM | POA: Diagnosis not present

## 2019-11-29 DIAGNOSIS — Z7401 Bed confinement status: Secondary | ICD-10-CM | POA: Diagnosis not present

## 2019-11-29 LAB — BASIC METABOLIC PANEL
Anion gap: 5 (ref 5–15)
BUN: 41 mg/dL — ABNORMAL HIGH (ref 8–23)
CO2: 26 mmol/L (ref 22–32)
Calcium: 8.3 mg/dL — ABNORMAL LOW (ref 8.9–10.3)
Chloride: 109 mmol/L (ref 98–111)
Creatinine, Ser: 1.14 mg/dL — ABNORMAL HIGH (ref 0.44–1.00)
GFR calc Af Amer: 50 mL/min — ABNORMAL LOW (ref >60)
GFR calc non Af Amer: 43 mL/min — ABNORMAL LOW (ref >60)
Glucose, Bld: 86 mg/dL (ref 70–99)
Potassium: 5.4 mmol/L — ABNORMAL HIGH (ref 3.5–5.1)
Sodium: 140 mmol/L (ref 135–145)

## 2019-11-29 LAB — GLUCOSE, CAPILLARY
Glucose-Capillary: 72 mg/dL (ref 70–99)
Glucose-Capillary: 98 mg/dL (ref 70–99)

## 2019-11-29 MED ORDER — POLYETHYLENE GLYCOL 3350 17 G PO PACK: 17.0000 g | PACK | Freq: Two times a day (BID) | ORAL | Status: DC

## 2019-11-29 NOTE — Progress Notes (Signed)
Report given to [facility] at [facility].

## 2019-11-29 NOTE — Progress Notes (Signed)
PTAR called, scheduled for 4:30pm as requested by pt's RN.  CSW will continue to follow for D/C needs.  Alphonse Guild. Trystan Eads  MSW, LCSW, LCAS, CSI Transitions of Care Clinical Social Worker Care Coordination Department Ph: [phone]

## 2019-11-29 NOTE — Discharge Instructions (Signed)
[name],  You were in the hospital because of fluid build up related to poor albumin. You were also found to have some brain swelling and a brain disease that responded well to steroids; this disease is called amyloid angiopathy. You need to follow-up with a neurologist. You were also found to have some kidney disease. I recommend that you follow-up with a nephrologist (kidney doctor)

## 2019-11-29 NOTE — Progress Notes (Signed)
[facility]AR here to transport patient to [facility].  Nursing report had been called to the facility earlier.  Report given to [facility] nurse.  Patient left without incident.

## 2019-11-29 NOTE — Discharge Summary (Signed)
Physician Discharge Summary  [name]:[id number] DOB: 1930-06-18 DOA: 11/18/2019  PCP: Kerin Perna, NP  Admit date: 11/18/2019 Discharge date: 11/29/2019  Admitted From: Home Disposition: SNF  Recommendations for Outpatient Follow-up:  1. Follow up with PCP in 1 week 2. Follow up with neurology in 2 weeks; will need repeat MRI 3. Follow up with nephrology 4. Please obtain BMP/CBC in one week 5. Please follow up on the following pending results: None  Home Health: SNF Equipment/Devices: Wheelchair, Wheelchair cushion  Discharge Condition: Stable CODE STATUS: Full code Diet recommendation: Renal diet   Brief/Interim Summary:  Admission HPI written by Rise Patience, MD   Chief Complaint: Confusion and inability to walk.  HPI: [name] is a 86 y.o. female with history of hypertension chronic lower extremity edema was brought to the ER after patient's daughter found the patient was confused yesterday when patient's daughter visited the patient at around 5 AM.  Patient was sitting confused with not wearing anything waist below.  Patient was repeating certain words which was not making any sense.  Follow difficult to walk with some ataxic gait.  Per patient's daughter patient was normal the previous night.  Patient did not have any nausea vomiting diarrhea chest pain shortness of breath headache or fever or chills.  No new medications.  ED Course: In the ER on exam patient appears to have difficulty moving her lower extremities and was initially mildly confused and repeating words.  Patient's temperature was around 99 F UA was unremarkable CT angiogram of the chest was negative for anything acute.  EKG was showing sinus tachycardia.  Blood cultures and urine cultures were obtained concerning for sepsis initiated lactic acid was elevated.  CBC was unremarkable creatinine 1.3 BNP 296 D-dimer was 2.05 AST 66.  Hemoglobin of 1.4.  Covid test pending.  MRI of  the brain done shows abnormality in the right occipital and temporal area concerning for sequelae of old stroke or trauma or could be metastatic disease and requested MRI brain with contrast.  Neurology was consulted and patient admitted for further management.   Hospital course:  Acute metabolic encephalopathy Concern for cerebral amyloid angiopathy from MRI. MRI also with evidence of vasogenic edema. Neurology consulted and recommended IV steroids x5 days for which she completed. Neurology recommended holding off on continued steroids secondary to patient age; consideration for outpatient neurology if patient's symptoms worsen. Also recommending repeat MRI brain in 2-4 weeks in addition to follow-up with stroke service, Dr. Leonie Man.  Lower extremity edema Slightly improved. No evidence of heart failure. Worsened with IV fluids. Likely secondary to hypoalbuminemia. Will need to improve nutrition. Can restart diuresis once nutrition improves.  Right arm pain/swelling Venous duplex negative for DVT.  Dyspnea Possibly position. Chest x-ray obtained and without acute process. Resolved.  AKI on CKD stage III Improved with IV fluids initially. Renal ultrasound shows small sized kidneys with signs of atrophy. Creatine stable. Recommend nephrology follow-up. Holding losartan, lasix and spironolactone on discharge. Repeat BMP in 3-5 days. Consider restarting losartan if continued stability vs nephrology recommendations.  Constipation No obstruction on abdominal x-ray. Bowel regimen.  Obesity Body mass index is 32.58 kg/m.  Discharge Diagnoses:  Principal Problem:   Acute encephalopathy Active Problems:   Hypertension    Discharge Instructions   Allergies as of 11/29/2019      Reactions   Penicillins Hives   Has patient had a PCN reaction causing immediate rash, facial/tongue/throat swelling, SOB or lightheadedness  with hypotension: No Has patient had a PCN reaction causing severe  rash involving mucus membranes or skin necrosis: No Has patient had a PCN reaction that required hospitalization No Has patient had a PCN reaction occurring within the last 10 years: No If all of the above answers are "NO", then may proceed with Cephalosporin use.   Tetanus Toxoid Hives      Medication List    STOP taking these medications   alendronate 35 MG tablet Commonly known as: FOSAMAX   amLODipine 10 MG tablet Commonly known as: NORVASC   furosemide 20 MG tablet Commonly known as: LASIX   loratadine 10 MG tablet Commonly known as: CLARITIN   losartan 50 MG tablet Commonly known as: COZAAR   spironolactone 25 MG tablet Commonly known as: ALDACTONE     TAKE these medications   acetaminophen 500 MG tablet Commonly known as: TYLENOL Take 500 mg by mouth at bedtime.   ALOE VERA JUICE PO Take 6 oz by mouth daily.   aspirin EC 81 MG tablet Take 1 tablet (81 mg total) by mouth daily.   [employer] Vitamin C 500 MG tablet Generic drug: ascorbic acid Take 500 mg by mouth daily.   docusate sodium 50 MG capsule Commonly known as: COLACE Take 1 capsule (50 mg total) by mouth 2 (two) times daily.   gabapentin 100 MG capsule Commonly known as: NEURONTIN Take 1 tablets in AM and 1 tablet at lunch and continue 2 tablets at bedtime What changed:   how much to take  how to take this  when to take this   GARLIC PO Take 6,578 mg by mouth daily.   multivitamin with minerals Tabs tablet Take 1 tablet by mouth daily.   Polyethyl Glycol-Propyl Glycol 0.4-0.3 % Soln Place 1 drop into both eyes 3 (three) times daily as needed (for lubricating eyes).   polyethylene glycol 17 g packet Commonly known as: MIRALAX / GLYCOLAX Take 17 g by mouth 2 (two) times daily. Can decrease once having daily stools   tetrahydrozoline 0.05 % ophthalmic solution Place 1 drop into both eyes daily as needed (eye irritation).   trolamine salicylate 10 % cream Commonly known as:  ASPERCREME Apply 1 application topically as needed for muscle pain.   VITAMIN B-12 PO Take 1 tablet by mouth daily.       Allergies  Allergen Reactions  . Penicillins Hives    Has patient had a PCN reaction causing immediate rash, facial/tongue/throat swelling, SOB or lightheadedness with hypotension: No Has patient had a PCN reaction causing severe rash involving mucus membranes or skin necrosis: No Has patient had a PCN reaction that required hospitalization No Has patient had a PCN reaction occurring within the last 10 years: No If all of the above answers are "NO", then may proceed with Cephalosporin use.  . Tetanus Toxoid Hives    Consultations:  Neurology   Procedures/Studies: DG Elbow 2 Views Right  Result Date: 11/22/2019 CLINICAL DATA:  Atraumatic right elbow pain. EXAM: RIGHT ELBOW - 2 VIEW COMPARISON:  None. FINDINGS: There is no evidence of acute fracture, dislocation, or joint effusion. There is no evidence of arthropathy or other focal bone abnormality. Thin curvilinear soft tissue calcification is seen adjacent to the right olecranon process and lateral epicondyle of the distal right humerus. IMPRESSION: 1. No acute osseous abnormality. 2. Thin curvilinear soft tissue calcification adjacent to the right olecranon process and lateral epicondyle of the distal right humerus. This may represent calcific tendinopathy. Electronically Signed  By: Virgina Norfolk M.D.   On: 11/22/2019 19:45   CT Angio Chest PE W and/or Wo Contrast  Result Date: 11/19/2019 CLINICAL DATA:  Extremity edema, elevated D-dimer EXAM: CT ANGIOGRAPHY CHEST WITH CONTRAST TECHNIQUE: Multidetector CT imaging of the chest was performed using the standard protocol during bolus administration of intravenous contrast. Multiplanar CT image reconstructions and MIPs were obtained to evaluate the vascular anatomy. CONTRAST:  128mL OMNIPAQUE IOHEXOL 350 MG/ML SOLN COMPARISON:  09/23/2015 FINDINGS:  Cardiovascular: No filling defects in the pulmonary arteries to suggest pulmonary emboli. Heart is enlarged. Scattered aortic calcifications. No evidence of aortic aneurysm. Prominent central pulmonary arteries. Main pulmonary artery measures 38 mm. Findings compatible with pulmonary arterial hypertension. Mediastinum/Nodes: No mediastinal, hilar, or axillary adenopathy. Trachea and esophagus are unremarkable. Thyroid unremarkable. Lungs/Pleura: Mild elevation of the right hemidiaphragm. No confluent opacities, effusions or edema. Upper Abdomen: Imaging into the upper abdomen shows no acute findings. Musculoskeletal: No acute bony abnormality. Chest wall soft tissues are unremarkable. Review of the MIP images confirms the above findings. IMPRESSION: Cardiomegaly.  Pulmonary arterial hypertension. No evidence of pulmonary embolus. Aortic Atherosclerosis (ICD10-I70.0). Electronically Signed   By: Rolm Baptise M.D.   On: 11/19/2019 03:57   MR BRAIN WO CONTRAST  Result Date: 11/22/2019 CLINICAL DATA:  86 year old female with recent altered mental status, cephalopathy. Constellation of findings on recent MRIs most compatible with inflammatory disease related to amyloid angiopathy (e.g. amyloid beta related angiitis). EXAM: MRI HEAD WITHOUT CONTRAST TECHNIQUE: Multiplanar, multiecho pulse sequences of the brain and surrounding structures were obtained without intravenous contrast. COMPARISON:  Brain MRI with contrast 11/19/2019 and without contrast 11/18/2019. FINDINGS: Brain: Continued confluent T2 and FLAIR hyperintensity in the posterior right parietal lobe, lateral right temporal lobe in a vasogenic edema pattern. No change compared to 11/18/2019. Diffusion weighted imaging remains negative. T2* imaging rather than SWI performed today, but again demonstrates evidence of punctate microhemorrhages with the relatively subcortical distribution in the affected areas and also the contralateral left parietal lobe. Stable  gray and white matter signal elsewhere. Possible chronic encephalomalacia in the dorsal cerebellum, not significantly changed from a 2014 MRI. No new signal abnormality identified. No restricted diffusion to suggest acute infarction. No midline shift, mass effect, ventriculomegaly, extra-axial collection or acute intracranial hemorrhage. Cervicomedullary junction and pituitary are within normal limits. Vascular: Major intracranial vascular flow voids appear stable. Skull and upper cervical spine: Widespread advanced cervical spine degeneration appears stable from the recent MRI. Visualized bone marrow signal is within normal limits. Sinuses/Orbits: Stable and negative. Other: Mastoids remain clear. IMPRESSION: 1. Unchanged MRI appearance of the brain since 11/18/2019, with areas of vasogenic edema in the right parietal lobe and left temporal lobe most compatible with amyloid related inflammation such as ABRA (amyloid beta related angitis). 2. No new intracranial abnormality. Electronically Signed   By: Genevie Ann M.D.   On: 11/22/2019 14:02   MR BRAIN WO CONTRAST  Result Date: 11/18/2019 CLINICAL DATA:  Encephalopathy EXAM: MRI HEAD WITHOUT CONTRAST TECHNIQUE: Multiplanar, multiecho pulse sequences of the brain and surrounding structures were obtained without intravenous contrast. COMPARISON:  None. FINDINGS: Brain: There are new areas of hyperintense T2-weighted signal within the right occipital and anterior right temporal white matter. There is no abnormal diffusion restriction. Normal white matter signal. Normal volume of CSF spaces. numerous chronic microhemorrhages in a predominantly peripheral distribution. Normal midline structures. Vascular: Normal flow voids. Skull and upper cervical spine: Normal marrow signal. Sinuses/Orbits: Negative. Other: None. IMPRESSION: 1. No acute ischemia or mass  effect. 2. New areas of hyperintense T2-weighted signal within the right occipital and anterior right temporal white  matter, which may be sequela of prior ischemia or trauma. However, this could also indicate vasogenic edema from metastatic disease. Further imaging with intravenous contrast is recommended to assess for the possibility of metastases. 3. Numerous chronic microhemorrhages in a predominantly peripheral distribution most consistent with cerebral amyloid angiopathy. Electronically Signed   By: Ulyses Jarred M.D.   On: 11/18/2019 20:14   MR BRAIN W CONTRAST  Result Date: 11/19/2019 CLINICAL DATA:  Encephalopathy. Abnormal noncontrast study yesterday, unable to rule out metastatic disease. EXAM: MRI HEAD WITH CONTRAST TECHNIQUE: Multiplanar, multiecho pulse sequences of the brain and surrounding structures were obtained with intravenous contrast. CONTRAST:  56mL GADAVIST GADOBUTROL 1 MMOL/ML IV SOLN COMPARISON:  11/18/2019 FINDINGS: Today's study does not show abnormal contrast enhancement of the brain or leptomeninges. Therefore there is no evidence of metastatic disease. In correlation with yesterday's study, I think that study showed a vasogenic edema pattern rather than cytotoxic edema pattern. We are seeing white matter edema with relative sparing of the overlying cortex. In this patient with multiple foci of hemosiderin deposition in the peripheral brain likely indicating amyloid angiopathy, the most likely diagnosis is either amyloid beta related angiitis or inflammatory cerebral amyloid angiopathy. IMPRESSION: Findings consistent with either amyloid beta related angiitis or inflammatory cerebral amyloid angiopathy. No evidence of metastatic disease. I doubt the diagnoses of atypical PRES, prior ischemic infarction, prior trauma or PML, which were considered. Electronically Signed   By: Nelson Chimes M.D.   On: 11/19/2019 10:14   US RENAL  Result Date: 11/27/2019 CLINICAL DATA:  Acute kidney injury EXAM: RENAL / URINARY TRACT ULTRASOUND COMPLETE COMPARISON:  Ultrasound 12/16/2003 FINDINGS: Right Kidney: Renal  measurements: 6.1 by 3.1 x 4.3 cm = volume: 42.3 mL . Echogenicity within normal limits. No mass or hydronephrosis visualized. Left Kidney: Renal measurements: 8.2 x 4.5 x 5.6 cm = volume: 105.4 mL. Echogenicity within normal limits. No mass or hydronephrosis visualized. Bladder: Appears normal for degree of bladder distention. Other: None. IMPRESSION: Somewhat small size of the kidneys, right renal length smaller than the left, suggesting a component of atrophy. There is no hydronephrosis. Electronically Signed   By: Donavan Foil M.D.   On: 11/27/2019 19:42   US Venous Img Lower Bilateral (DVT)  Result Date: 11/15/2019 CLINICAL DATA:  86 year old female with a history of edema EXAM: BILATERAL LOWER EXTREMITY VENOUS DOPPLER ULTRASOUND TECHNIQUE: Gray-scale sonography with graded compression, as well as color Doppler and duplex ultrasound were performed to evaluate the lower extremity deep venous systems from the level of the common femoral vein and including the common femoral, femoral, profunda femoral, popliteal and calf veins including the posterior tibial, peroneal and gastrocnemius veins when visible. The superficial great saphenous vein was also interrogated. Spectral Doppler was utilized to evaluate flow at rest and with distal augmentation maneuvers in the common femoral, femoral and popliteal veins. COMPARISON:  None. FINDINGS: RIGHT LOWER EXTREMITY Common Femoral Vein: No evidence of thrombus. Normal compressibility, respiratory phasicity and response to augmentation. Saphenofemoral Junction: No evidence of thrombus. Normal compressibility and flow on color Doppler imaging. Profunda Femoral Vein: No evidence of thrombus. Normal compressibility and flow on color Doppler imaging. Femoral Vein: No evidence of thrombus. Normal compressibility, respiratory phasicity and response to augmentation. Popliteal Vein: No evidence of thrombus. Normal compressibility, respiratory phasicity and response to  augmentation. Calf Veins: Calf veins not well visualized. Superficial Great Saphenous Vein: No  evidence of thrombus. Normal compressibility and flow on color Doppler imaging. Other Findings:  Edema LEFT LOWER EXTREMITY Common Femoral Vein: No evidence of thrombus. Normal compressibility, respiratory phasicity and response to augmentation. Saphenofemoral Junction: No evidence of thrombus. Normal compressibility and flow on color Doppler imaging. Profunda Femoral Vein: No evidence of thrombus. Normal compressibility and flow on color Doppler imaging. Femoral Vein: No evidence of thrombus. Normal compressibility, respiratory phasicity and response to augmentation. Popliteal Vein: No evidence of thrombus. Normal compressibility, respiratory phasicity and response to augmentation. Calf Veins: Calf veins not well visualized. Superficial Great Saphenous Vein: No evidence of thrombus. Normal compressibility and flow on color Doppler imaging. Other Findings:  Edema IMPRESSION: Sonographic survey of the bilateral lower extremities negative for DVT. Bilateral lower extremity edema Electronically Signed   By: Corrie Mckusick D.O.   On: 11/15/2019 14:04   DG CHEST PORT 1 VIEW  Result Date: 11/26/2019 CLINICAL DATA:  Altered mental status. EXAM: PORTABLE CHEST 1 VIEW COMPARISON:  Chest radiograph 11/18/2019. FINDINGS: Monitoring leads overlie the patient. Stable enlarged cardiac and mediastinal contours. Aortic atherosclerosis. Pulmonary arterial enlargement. Elevation right hemidiaphragm. No large area pulmonary consolidation. No pleural effusion or pneumothorax. Thoracic spine degenerative changes. IMPRESSION: No acute cardiopulmonary process. Electronically Signed   By: Lovey Newcomer M.D.   On: 11/26/2019 15:01   DG Chest Portable 1 View  Result Date: 11/18/2019 CLINICAL DATA:  Fever. EXAM: PORTABLE CHEST 1 VIEW COMPARISON:  PA and lateral chest 11/06/2016 and 05/31/2016. FINDINGS: The lungs are clear. Heart size is upper  normal. Prominence of the pulmonary outflow tract compatible with pulmonary arterial hypertension noted. Atherosclerotic vascular disease is seen. No pneumothorax or pleural effusion. No acute or focal bony abnormality. IMPRESSION: No acute disease. Findings suggestive of pulmonary arterial hypertension. Atherosclerosis. Electronically Signed   By: Inge Rise M.D.   On: 11/18/2019 14:42   DG Abd Portable 1V  Result Date: 11/28/2019 CLINICAL DATA:  Abdominal pain and constipation. EXAM: PORTABLE ABDOMEN - 1 VIEW COMPARISON:  Ultrasound 12/16/2003 report. FINDINGS: Soft tissue structures are unremarkable. Stool noted throughout the colon suggesting constipation. No bowel distention or free air. Calcification noted over the liver and spleen suggesting granulomas. Calcific densities noted over the right abdomen possibly phleboliths. Ureteral stones cannot be excluded. Degenerative changes and scoliosis lumbar spine. Degenerative changes both hips. IMPRESSION: 1. Stool noted throughout the colon suggesting constipation. No bowel distention. 2. Calcific densities noted over the right abdomen, possibly phleboliths. Right ureteral stones cannot be excluded. Clinical correlation suggested. Electronically Signed   By: Marcello Moores  Register   On: 11/28/2019 16:05   VAS [place] UPPER EXTREMITY VENOUS DUPLEX  Result Date: 11/24/2019 UPPER VENOUS STUDY  Indications: Edema Comparison Study: No prior study Performing Technologist: Maudry Mayhew MHA, RDMS, RVT, RDCS  Examination Guidelines: A complete evaluation includes B-mode imaging, spectral Doppler, color Doppler, and power Doppler as needed of all accessible portions of each vessel. Bilateral testing is considered an integral part of a complete examination. Limited examinations for reoccurring indications may be performed as noted.  Right Findings: +----------+------------+---------+-----------+----------+-------+ RIGHT      CompressiblePhasicitySpontaneousPropertiesSummary +----------+------------+---------+-----------+----------+-------+ IJV           Full       Yes       Yes                      +----------+------------+---------+-----------+----------+-------+ Subclavian    Full       Yes       Yes                      +----------+------------+---------+-----------+----------+-------+  Axillary      Full       Yes       Yes                      +----------+------------+---------+-----------+----------+-------+ Brachial      Full       Yes       Yes                      +----------+------------+---------+-----------+----------+-------+ Radial        Full                                          +----------+------------+---------+-----------+----------+-------+ Ulnar         Full                                          +----------+------------+---------+-----------+----------+-------+ Cephalic      Full                                          +----------+------------+---------+-----------+----------+-------+ Basilic       Full                                          +----------+------------+---------+-----------+----------+-------+  Left Findings: +----------+------------+---------+-----------+----------+-------+ LEFT      CompressiblePhasicitySpontaneousPropertiesSummary +----------+------------+---------+-----------+----------+-------+ Subclavian               Yes       Yes                      +----------+------------+---------+-----------+----------+-------+  Summary:  Right: No evidence of deep vein thrombosis in the upper extremity. No evidence of superficial vein thrombosis in the upper extremity.  Left: No evidence of thrombosis in the subclavian.  *See table(s) above for measurements and observations.  Diagnosing physician: Harold Barban MD Electronically signed by Harold Barban MD on 11/24/2019 at 8:55:10 AM.    Final    DG FLUORO GUIDE LUMBAR  PUNCTURE  Result Date: 11/21/2019 CLINICAL DATA:  Encephalopathy. Patient presents for fluoroscopically guided lumbar puncture for CSF acquisition. EXAM: DIAGNOSTIC LUMBAR PUNCTURE UNDER FLUOROSCOPIC GUIDANCE FLUOROSCOPY TIME:  Fluoroscopy Time:  40 seconds Radiation Exposure Index (if provided by the fluoroscopic device): 13.7 mGy Number of Acquired Spot Images: 2 PROCEDURE: Informed consent was obtained from the patient prior to the procedure, including potential complications of headache, allergy, and pain. With the patient prone, the lower back was prepped with Betadine. 1% Lidocaine was used for local anesthesia. Lumbar puncture was performed at the L3-L4 level using a 20 gauge spinal needle with return of initially slightly cloudy then clear CSF. Opening pressure was not obtained due to difficulty with patient positioning and to expedite the exam to limit the time the patient was needing to be prone. Twelve ml of CSF were obtained for laboratory studies. The patient tolerated the procedure well and there were no apparent complications. IMPRESSION: 1. Successful fluoroscopically guided lumbar puncture for CSF acquisition. Electronically Signed   By: Lajean Manes M.D.   On: 11/21/2019 15:41  Subjective: No concerns today.  Discharge Exam: Vitals:   11/28/19 2117 11/29/19 0538  BP: 132/67 (!) 148/59  Pulse: 75 69  Resp: 17 18  Temp: 98.3 F (36.8 C) 98.3 F (36.8 C)  SpO2: 97% 99%   Vitals:   11/28/19 0822 11/28/19 1503 11/28/19 2117 11/29/19 0538  BP: (!) 113/51 (!) 103/47 132/67 (!) 148/59  Pulse: 68 79 75 69  Resp: 18 18 17 18   Temp: (!) 97.3 F (36.3 C) 97.9 F (36.6 C) 98.3 F (36.8 C) 98.3 F (36.8 C)  TempSrc: Oral Oral Oral Oral  SpO2: 100% 100% 97% 99%  Weight:      Height:        General: Pt is alert, awake, not in acute distress Cardiovascular: RRR, S1/S2 +, no rubs, no gallops Respiratory: CTA bilaterally, no wheezing, no rhonchi Abdominal: Soft, NT, ND,  bowel sounds + Extremities: 2+ LE edema, no cyanosis    The results of significant diagnostics from this hospitalization (including imaging, microbiology, ancillary and laboratory) are listed below for reference.     Microbiology: Recent Results (from the past 240 hour(s))  CSF culture     Status: None   Collection Time: 11/21/19  3:03 PM   Specimen: PATH Cytology CSF; Cerebrospinal Fluid  Result Value Ref Range Status   Specimen Description   Final    CSF Performed at [facility] [address], [place], [place] [address]    Special Requests   Final    NONE Performed at [facility], [place] [address] [place], [place] [address]    Gram Stain   Final    NO WBC SEEN NO ORGANISMS SEEN CYTOSPIN SMEAR Gram Stain Report Called to,Read Back By and Verified With: S.ADAMSON AT 1805 ON 11/21/19 BY N.THOMPSON Performed at [facility], [place] [address] [place], [place] [address]    Culture   Final    NO GROWTH 3 DAYS Performed at [facility], [place] [address] [place], [place] [address]    Report Status 11/25/2019 FINAL  Final  Respiratory Panel by RT PCR (Flu A&B, Covid) - Nasopharyngeal Swab     Status: None   Collection Time: 11/24/19  6:13 PM   Specimen: Nasopharyngeal Swab  Result Value Ref Range Status   SARS Coronavirus 2 by RT PCR NEGATIVE NEGATIVE Final    Comment: (NOTE) SARS-CoV-2 target nucleic acids are NOT DETECTED. The SARS-CoV-2 RNA is generally detectable in upper respiratoy specimens during the acute phase of infection. The lowest concentration of SARS-CoV-2 viral copies this assay can detect is 131 copies/mL. A negative result does not preclude SARS-Cov-2 infection and should not be used as the sole basis for treatment or other patient management decisions. A negative result may occur with  improper specimen collection/handling, submission of specimen other than nasopharyngeal swab, presence of viral  mutation(s) within the areas targeted by this assay, and inadequate number of viral copies (<131 copies/mL). A negative result must be combined with clinical observations, patient history, and epidemiological information. The expected result is Negative. Fact Sheet for Patients:  PinkCheek.be Fact Sheet for Healthcare Providers:  GravelBags.it This test is not yet ap proved or cleared by the [place] FDA and  has been authorized for detection and/or diagnosis of SARS-CoV-2 by FDA under an Emergency Use Authorization (EUA). This EUA will remain  in effect (meaning this test can be used) for the duration of the COVID-19 declaration under Section 564(b)(1) of the Act,  21 U.S.C. section 360bbb-3(b)(1), unless the authorization is terminated or revoked sooner.    Influenza A by PCR NEGATIVE NEGATIVE Final   Influenza B by PCR NEGATIVE NEGATIVE Final    Comment: (NOTE) The Xpert Xpress SARS-CoV-2/FLU/RSV assay is intended as an aid in  the diagnosis of influenza from Nasopharyngeal swab specimens and  should not be used as a sole basis for treatment. Nasal washings and  aspirates are unacceptable for Xpert Xpress SARS-CoV-2/FLU/RSV  testing. Fact Sheet for Patients: PinkCheek.be Fact Sheet for Healthcare Providers: GravelBags.it This test is not yet approved or cleared by the [place] FDA and  has been authorized for detection and/or diagnosis of SARS-CoV-2 by  FDA under an Emergency Use Authorization (EUA). This EUA will remain  in effect (meaning this test can be used) for the duration of the  Covid-19 declaration under Section 564(b)(1) of the Act, 21  U.S.C. section 360bbb-3(b)(1), unless the authorization is  terminated or revoked. Performed at [facility], [place] [address], [place], [place] [address]   SARS CORONAVIRUS 2 (TAT 6-24 HRS)  Nasopharyngeal Nasopharyngeal Swab     Status: None   Collection Time: 11/28/19  1:57 PM   Specimen: Nasopharyngeal Swab  Result Value Ref Range Status   SARS Coronavirus 2 NEGATIVE NEGATIVE Final    Comment: (NOTE) SARS-CoV-2 target nucleic acids are NOT DETECTED. The SARS-CoV-2 RNA is generally detectable in upper and lower respiratory specimens during the acute phase of infection. Negative results do not preclude SARS-CoV-2 infection, do not rule out co-infections with other pathogens, and should not be used as the sole basis for treatment or other patient management decisions. Negative results must be combined with clinical observations, patient history, and epidemiological information. The expected result is Negative. Fact Sheet for Patients: SugarRoll.be Fact Sheet for Healthcare Providers: https://www.woods-mathews.com/ This test is not yet approved or cleared by the [place] FDA and  has been authorized for detection and/or diagnosis of SARS-CoV-2 by FDA under an Emergency Use Authorization (EUA). This EUA will remain  in effect (meaning this test can be used) for the duration of the COVID-19 declaration under Section 56 4(b)(1) of the Act, 21 U.S.C. section 360bbb-3(b)(1), unless the authorization is terminated or revoked sooner. Performed at [facility], [place] [address] [place], [place] [address]      Labs: BNP (last 3 results) Recent Labs    11/18/19 1405  BNP 237.6*   Basic Metabolic Panel: Recent Labs  Lab 11/23/19 0534 11/23/19 0534 11/24/19 0504 11/24/19 0504 11/25/19 0447 11/25/19 0447 11/26/19 0436 11/27/19 0826 11/27/19 1633 11/28/19 0427 11/29/19 0816  NA 139   < > 139   < > 136   < > 138 136 138 138 140  K 5.3*   < > 5.3*   < > 4.5   < > 4.6 6.0* 4.2 4.5 5.4*  CL 105   < > 106   < > 107   < > 108 108 108 110 109  CO2 24   < > 25   < > 23   < > 24 22 23 24 26   GLUCOSE 311*   < > 152*   < >  156*   < > 177* 152* 151* 113* 86  BUN 39*   < > 45*   < > 44*   < > 46* 49* 43* 48* 41*  CREATININE 1.37*   < > 1.28*   < > 1.12*   < > 1.19* 1.28*  1.17* 1.31* 1.14*  CALCIUM 8.5*   < > 8.5*   < > 7.8*   < > 8.1* 8.1* 8.1* 8.0* 8.3*  MG 2.3  --  2.5*  --  2.4  --  2.6*  --   --   --   --   PHOS 2.8  --  2.7  --  2.8  --  3.2  --   --   --   --    < > = values in this interval not displayed.   Liver Function Tests: Recent Labs  Lab 11/23/19 0534 11/24/19 0504 11/25/19 0447 11/26/19 0436  AST 42* 74* 40 25  ALT 43 70* 56* 49*  ALKPHOS 89 76 72 75  BILITOT 0.4 0.5 0.1* 0.2*  PROT 6.5 6.3* 5.3* 5.4*  ALBUMIN 2.3* 2.2* 1.8* 1.9*   No results for input(s): LIPASE, AMYLASE in the last 168 hours. No results for input(s): AMMONIA in the last 168 hours. CBC: Recent Labs  Lab 11/23/19 0534 11/24/19 0504 11/25/19 0447 11/26/19 0436 11/28/19 0427  WBC 5.6 7.1 6.0 5.6 7.9  NEUTROABS 4.9 6.1 5.2 4.6  --   HGB 10.8* 10.1* 8.8* 9.4* 9.6*  HCT 36.0 32.9* 29.1* 30.7* 31.4*  MCV 87.0 83.7 84.6 83.7 85.3  PLT 228 257 280 314 391   Cardiac Enzymes: No results for input(s): CKTOTAL, CKMB, CKMBINDEX, TROPONINI in the last 168 hours. BNP: Invalid input(s): POCBNP CBG: Recent Labs  Lab 11/28/19 0738 11/28/19 1156 11/28/19 1631 11/28/19 2114 11/29/19 0748  GLUCAP 78 145* 102* 106* 72   D-Dimer No results for input(s): DDIMER in the last 72 hours. Hgb A1c No results for input(s): HGBA1C in the last 72 hours. Lipid Profile No results for input(s): CHOL, HDL, LDLCALC, TRIG, CHOLHDL, LDLDIRECT in the last 72 hours. Thyroid function studies No results for input(s): TSH, T4TOTAL, T3FREE, THYROIDAB in the last 72 hours.  Invalid input(s): FREET3 Anemia work up No results for input(s): VITAMINB12, FOLATE, FERRITIN, TIBC, IRON, RETICCTPCT in the last 72 hours. Urinalysis    Component Value Date/Time   COLORURINE YELLOW 11/18/2019 1431   APPEARANCEUR CLEAR 11/18/2019 1431   LABSPEC  1.024 11/18/2019 1431   PHURINE 5.0 11/18/2019 1431   GLUCOSEU NEGATIVE 11/18/2019 1431   HGBUR NEGATIVE 11/18/2019 1431   BILIRUBINUR NEGATIVE 11/18/2019 1431   KETONESUR 5 (A) 11/18/2019 1431   PROTEINUR 100 (A) 11/18/2019 1431   NITRITE NEGATIVE 11/18/2019 1431   LEUKOCYTESUR NEGATIVE 11/18/2019 1431   Sepsis Labs Invalid input(s): PROCALCITONIN,  WBC,  LACTICIDVEN Microbiology Recent Results (from the past 240 hour(s))  CSF culture     Status: None   Collection Time: 11/21/19  3:03 PM   Specimen: PATH Cytology CSF; Cerebrospinal Fluid  Result Value Ref Range Status   Specimen Description   Final    CSF Performed at [facility], [place] [address] [place], [place] [address]    Special Requests   Final    NONE Performed at [facility], [place] [address], [place], [place] [address]    Gram Stain   Final    NO WBC SEEN NO ORGANISMS SEEN CYTOSPIN SMEAR Gram Stain Report Called to,Read Back By and Verified With: S.ADAMSON AT 1805 ON 11/21/19 BY N.THOMPSON Performed at [facility], [place] [address], [place], [place] [address]    Culture   Final    NO GROWTH 3 DAYS Performed at [facility], [place] [address], [place], [place] [address]  Report Status 11/25/2019 FINAL  Final  Respiratory Panel by RT PCR (Flu A&B, Covid) - Nasopharyngeal Swab     Status: None   Collection Time: 11/24/19  6:13 PM   Specimen: Nasopharyngeal Swab  Result Value Ref Range Status   SARS Coronavirus 2 by RT PCR NEGATIVE NEGATIVE Final    Comment: (NOTE) SARS-CoV-2 target nucleic acids are NOT DETECTED. The SARS-CoV-2 RNA is generally detectable in upper respiratoy specimens during the acute phase of infection. The lowest concentration of SARS-CoV-2 viral copies this assay can detect is 131 copies/mL. A negative result does not preclude SARS-Cov-2 infection and should not be used as the sole basis for treatment or other patient management  decisions. A negative result may occur with  improper specimen collection/handling, submission of specimen other than nasopharyngeal swab, presence of viral mutation(s) within the areas targeted by this assay, and inadequate number of viral copies (<131 copies/mL). A negative result must be combined with clinical observations, patient history, and epidemiological information. The expected result is Negative. Fact Sheet for Patients:  PinkCheek.be Fact Sheet for Healthcare Providers:  GravelBags.it This test is not yet ap proved or cleared by the [place] FDA and  has been authorized for detection and/or diagnosis of SARS-CoV-2 by FDA under an Emergency Use Authorization (EUA). This EUA will remain  in effect (meaning this test can be used) for the duration of the COVID-19 declaration under Section 564(b)(1) of the Act, 21 U.S.C. section 360bbb-3(b)(1), unless the authorization is terminated or revoked sooner.    Influenza A by PCR NEGATIVE NEGATIVE Final   Influenza B by PCR NEGATIVE NEGATIVE Final    Comment: (NOTE) The Xpert Xpress SARS-CoV-2/FLU/RSV assay is intended as an aid in  the diagnosis of influenza from Nasopharyngeal swab specimens and  should not be used as a sole basis for treatment. Nasal washings and  aspirates are unacceptable for Xpert Xpress SARS-CoV-2/FLU/RSV  testing. Fact Sheet for Patients: PinkCheek.be Fact Sheet for Healthcare Providers: GravelBags.it This test is not yet approved or cleared by the [place] FDA and  has been authorized for detection and/or diagnosis of SARS-CoV-2 by  FDA under an Emergency Use Authorization (EUA). This EUA will remain  in effect (meaning this test can be used) for the duration of the  Covid-19 declaration under Section 564(b)(1) of the Act, 21  U.S.C. section 360bbb-3(b)(1), unless the authorization  is  terminated or revoked. Performed at [facility], [place] [address] [place], [place] [address]   SARS CORONAVIRUS 2 (TAT 6-24 HRS) Nasopharyngeal Nasopharyngeal Swab     Status: None   Collection Time: 11/28/19  1:57 PM   Specimen: Nasopharyngeal Swab  Result Value Ref Range Status   SARS Coronavirus 2 NEGATIVE NEGATIVE Final    Comment: (NOTE) SARS-CoV-2 target nucleic acids are NOT DETECTED. The SARS-CoV-2 RNA is generally detectable in upper and lower respiratory specimens during the acute phase of infection. Negative results do not preclude SARS-CoV-2 infection, do not rule out co-infections with other pathogens, and should not be used as the sole basis for treatment or other patient management decisions. Negative results must be combined with clinical observations, patient history, and epidemiological information. The expected result is Negative. Fact Sheet for Patients: SugarRoll.be Fact Sheet for Healthcare Providers: https://www.woods-mathews.com/ This test is not yet approved or cleared by the [place] FDA and  has been authorized for detection and/or diagnosis of SARS-CoV-2 by FDA under an Emergency Use Authorization (EUA). This EUA will remain  in effect (  meaning this test can be used) for the duration of the COVID-19 declaration under Section 56 4(b)(1) of the Act, 21 U.S.C. section 360bbb-3(b)(1), unless the authorization is terminated or revoked sooner. Performed at [facility], [place] [address], [place], [place] [address]      Time coordinating discharge: 35 minutes  SIGNED:   Cordelia Poche, MD [facility] 11/29/2019, 10:57 AM

## 2019-11-29 NOTE — Progress Notes (Signed)
CSW received a call from admissions at [facility] stating the patient has been offered a bed and has been accepted and that the pt can arrive on 11/29/19.  The pt's accepting doctor is SN[facility] MD.  The room number will be 511.  The number for report is [phone].  CSW will update RN and EDP.  Alphonse Guild. Dionisio Aragones, Latanya Presser, LCAS Clinical Social Worker Ph: [phone]

## 2019-12-01 ENCOUNTER — Non-Acute Institutional Stay (SKILLED_NURSING_FACILITY): Payer: Medicare Other | Admitting: Internal Medicine

## 2019-12-01 ENCOUNTER — Encounter: Payer: Self-pay | Admitting: Internal Medicine

## 2019-12-01 DIAGNOSIS — I680 Cerebral amyloid angiopathy: Secondary | ICD-10-CM

## 2019-12-01 DIAGNOSIS — G9340 Encephalopathy, unspecified: Secondary | ICD-10-CM

## 2019-12-01 DIAGNOSIS — E854 Organ-limited amyloidosis: Secondary | ICD-10-CM | POA: Diagnosis not present

## 2019-12-01 NOTE — Progress Notes (Signed)
: Provider:  Hennie Duos., MD Location:  [facility] Room Number: 511-P Place of Service:  SNF (31)  PCP: Audley Hose, MD Patient Care Team: Audley Hose, MD as PCP - General (Internal Medicine)  Extended Emergency Contact Information Primary Emergency Contact: [facility] Address: [address]          [place] [address], [place] [address] [place] Phone: [phone] Relation: Daughter Secondary Emergency Contact: Carroll Sage Address: [address]          [place], [place] [address] [place] of [place] Mobile Phone: [phone] Relation: Granddaughter     Allergies: Penicillins and Tetanus toxoid  Chief Complaint  Patient presents with  . New Admit To SNF    New admission to [facility] SNF    HPI: Patient is an 86 y.o. female with hypertension, chronic lower extremity edema, who was brought to the ER after patient was found confused and unable to walk.  Patient was not wearing anything from the waist down.  Patient was reported to be repeating certain words which were not making any sense.  Per patient's daughter patient with normal the previous night patient had no other symptoms.  In the ED patient's temperature was normal UA was unremarkable CT angiogram of the chest was negative for any acute, EKG shows sinus tachycardia.  Blood cultures and urine cultures were obtained concerning for sepsis and lactic acid was elevated.  CBC was unremarkable, creatinine 1.3, BNP 296, D-dimer was 2.05 AST 66.  An MRI of the brain showed abnormality in the right occipital and temporal area there appeared to be cerebral amyloid angiopathy.  Patient was admitted to [facility] long hospital from 4/20-5/1 for neurology was consulted.  As there was some evidence of vasogenic edema on MRI IV steroids for 5 days were ordered.  Neurology recommended holding off on continued steroids secondary to patient age.  I also recommend repeating MRI in  2 to 4 weeks and today in addition to following up with stroke service Dr. Leonie Man.  Hospital course was complicated by AKI on CKD stage III which improved with IV fluids initially and creatinine was stable.  It was decided to hold losartan, Lasix, and spironolactone on discharge.  Patient is admitted to skilled nursing facility for OT/PT.  While at skilled nursing facility patient will be followed for neuropathy treated with gabapentin, vitamin B12 deficiency treated with replacement and hypertension treated with no medications.    Past Medical History:  Diagnosis Date  . CAP (community acquired pneumonia) 09/21/2015  . Chronic kidney disease, stage III (moderate)   . Encounter for long-term (current) use of other medications   . Glaucoma (increased eye pressure)   . Head pain   . Hypertension   . Impaired glucose tolerance test   . Insomnia, unspecified   . Morbid obesity ([place])   . Neck pain   . Osteoarthrosis, unspecified whether generalized or localized, other specified sites   . Other lymphedema   . Pain in joint, multiple sites   . Primary open-angle glaucoma(365.11)     Past Surgical History:  Procedure Laterality Date  . ABDOMINAL HYSTERECTOMY    . EYE SURGERY      Allergies as of 12/01/2019      Reactions   Penicillins Hives   Has patient had a PCN reaction causing immediate rash, facial/tongue/throat swelling, SOB or lightheadedness with hypotension: No Has patient had a PCN reaction causing severe rash involving mucus membranes or  skin necrosis: No Has patient had a PCN reaction that required hospitalization No Has patient had a PCN reaction occurring within the last 10 years: No If all of the above answers are "NO", then may proceed with Cephalosporin use.   Tetanus Toxoid Hives      Medication List       Accurate as of Dec 01, 2019 10:29 AM. If you have any questions, ask your nurse or doctor.        STOP taking these medications   ALOE VERA JUICE PO Stopped by:  Inocencio Homes, MD     TAKE these medications   acetaminophen 500 MG tablet Commonly known as: TYLENOL Take 500 mg by mouth at bedtime.   aspirin EC 81 MG tablet Take 1 tablet (81 mg total) by mouth daily.   [facility] Vitamin C 500 MG tablet Generic drug: ascorbic acid Take 500 mg by mouth daily.   docusate sodium 50 MG capsule Commonly known as: COLACE Take 1 capsule (50 mg total) by mouth 2 (two) times daily.   gabapentin 100 MG capsule Commonly known as: NEURONTIN Take 1 tablets in AM and 1 tablet at lunch and continue 2 tablets at bedtime   Garlic 3419 MG Caps Take 2,000 mg by mouth daily. Take 2 capsules to = 2000 mg What changed: Another medication with the same name was removed. Continue taking this medication, and follow the directions you see here. Changed by: Inocencio Homes, MD   multivitamin with minerals Tabs tablet Take 1 tablet by mouth daily.   Polyethyl Glycol-Propyl Glycol 0.4-0.3 % Soln Place 1 drop into both eyes 3 (three) times daily as needed (for lubricating eyes).   polyethylene glycol 17 g packet Commonly known as: MIRALAX / GLYCOLAX Take 17 g by mouth 2 (two) times daily. Can decrease once having daily stools   tetrahydrozoline 0.05 % ophthalmic solution Place 1 drop into both eyes daily as needed (eye irritation).   trolamine salicylate 10 % cream Commonly known as: ASPERCREME Apply 1 application topically as needed for muscle pain.   vitamin B-12 1000 MCG tablet Commonly known as: CYANOCOBALAMIN Take 1,000 mcg by mouth daily. What changed: Another medication with the same name was removed. Continue taking this medication, and follow the directions you see here. Changed by: Inocencio Homes, MD       No orders of the defined types were placed in this encounter.   Immunization History  Administered Date(s) Administered  . Pneumococcal Conjugate-13 01/22/2017  . Pneumococcal Polysaccharide-23 02/18/2018    Social History   Tobacco Use    . Smoking status: Never Smoker  . Smokeless tobacco: Never Used  Substance Use Topics  . Alcohol use: No    Alcohol/week: 0.0 standard drinks    Family history is   Family History  Problem Relation Age of Onset  . Cancer Father       Review of Systems  Patient can contribute some GENERAL:  no fevers, fatigue, appetite changes SKIN: No itching, or rash EYES: No eye pain, redness, discharge EARS: No earache, tinnitus, change in hearing NOSE: No congestion, drainage or bleeding  MOUTH/THROAT: No mouth or tooth pain, No sore throat RESPIRATORY: No cough, wheezing, SOB CARDIAC: No chest pain, palpitations, lower extremity edema  GI: No abdominal pain, No N/V/D or constipation, No heartburn or reflux  GU: No dysuria, frequency or urgency, or incontinence  MUSCULOSKELETAL: No unrelieved bone/joint pain NEUROLOGIC: No headache, dizziness or focal weakness PSYCHIATRIC: No c/o anxiety or sadness  Vitals:   12/01/19 0953  BP: (!) 145/64  Pulse: 74  Resp: 19  Temp: 97.7 F (36.5 C)  SpO2: 98%    SpO2 Readings from Last 1 Encounters:  12/01/19 98%   Body mass index is 32.58 kg/m.     Physical Exam  GENERAL APPEARANCE: Alert, conversant, confused no acute distress.  SKIN: No diaphoresis rash HEAD: Normocephalic, atraumatic  EYES: Conjunctiva/lids clear. Pupils round, reactive. EOMs intact.  EARS: External exam WNL, canals clear. Hearing grossly normal.  NOSE: No deformity or discharge.  MOUTH/THROAT: Lips w/o lesions  RESPIRATORY: Breathing is even, unlabored. Lung sounds are clear   CARDIOVASCULAR: Heart RRR no murmurs, rubs or gallops.  1+ peripheral edema.   GASTROINTESTINAL: Abdomen is soft, non-tender, not distended w/ normal bowel sounds. GENITOURINARY: Bladder non tender, not distended  MUSCULOSKELETAL: No abnormal joints or musculature NEUROLOGIC:  Cranial nerves 2-12 grossly intact. Moves all extremities  PSYCHIATRIC: Mood and affect with confusion, no  behavioral issues  Patient Active Problem List   Diagnosis Date Noted  . Hypoalbuminemia 11/29/2019  . Anasarca 11/29/2019  . PVD (peripheral vascular disease) ([place]) 12/19/2018  . Mitral valve insufficiency 03/19/2017  . Other spondylosis with radiculopathy, cervical region 03/14/2017  . Cerumen impaction 12/04/2016  . Generalized weakness 09/22/2015  . Acidosis 09/22/2015  . Acute encephalopathy 09/22/2015  . Hyperglycemia 09/22/2015  . Leukopenia 09/22/2015  . CAP (community acquired pneumonia) 09/22/2015  . Bilateral leg edema 09/22/2015  . Constipation 09/22/2015  . Snoring 09/13/2015  . Glaucoma (increased eye pressure)   . Hypertension   . Neck pain   . Head pain   . Pain in joint, multiple sites   . Impaired glucose tolerance test   . Osteoarthrosis, unspecified whether generalized or localized, other specified sites   . Morbid obesity ([place])   . Medication management   . Other lymphedema   . Insomnia, unspecified   . Chronic kidney disease, stage III (moderate)   . Primary open-angle glaucoma(365.11)       Labs reviewed: Basic Metabolic Panel:    Component Value Date/Time   NA 140 11/29/2019 0816   NA 132 (L) 03/24/2019 0926   K 5.4 (H) 11/29/2019 0816   CL 109 11/29/2019 0816   CO2 26 11/29/2019 0816   GLUCOSE 86 11/29/2019 0816   BUN 41 (H) 11/29/2019 0816   BUN 27 03/24/2019 0926   CREATININE 1.14 (H) 11/29/2019 0816   CALCIUM 8.3 (L) 11/29/2019 0816   PROT 5.4 (L) 11/26/2019 0436   PROT 6.4 03/24/2019 0926   ALBUMIN 1.9 (L) 11/26/2019 0436   ALBUMIN 3.0 (L) 11/19/2019 1732   AST 25 11/26/2019 0436   ALT 49 (H) 11/26/2019 0436   ALKPHOS 75 11/26/2019 0436   BILITOT 0.2 (L) 11/26/2019 0436   BILITOT 0.5 03/24/2019 0926   GFRNONAA 43 (L) 11/29/2019 0816   GFRAA 50 (L) 11/29/2019 0816    Recent Labs    11/24/19 0504 11/24/19 0504 11/25/19 0447 11/25/19 0447 11/26/19 0436 11/27/19 0826 11/27/19 1633 11/28/19 0427 11/29/19 0816  NA 139    < > 136   < > 138   < > 138 138 140  K 5.3*   < > 4.5   < > 4.6   < > 4.2 4.5 5.4*  CL 106   < > 107   < > 108   < > 108 110 109  CO2 25   < > 23   < > 24   < > 23  24 26  GLUCOSE 152*   < > 156*   < > 177*   < > 151* 113* 86  BUN 45*   < > 44*   < > 46*   < > 43* 48* 41*  CREATININE 1.28*   < > 1.12*   < > 1.19*   < > 1.17* 1.31* 1.14*  CALCIUM 8.5*   < > 7.8*   < > 8.1*   < > 8.1* 8.0* 8.3*  MG 2.5*  --  2.4  --  2.6*  --   --   --   --   PHOS 2.7  --  2.8  --  3.2  --   --   --   --    < > = values in this interval not displayed.   Liver Function Tests: Recent Labs    11/24/19 0504 11/25/19 0447 11/26/19 0436  AST 74* 40 25  ALT 70* 56* 49*  ALKPHOS 76 72 75  BILITOT 0.5 0.1* 0.2*  PROT 6.3* 5.3* 5.4*  ALBUMIN 2.2* 1.8* 1.9*   No results for input(s): LIPASE, AMYLASE in the last 8760 hours. Recent Labs    11/19/19 0530  AMMONIA 50*   CBC: Recent Labs    11/24/19 0504 11/24/19 0504 11/25/19 0447 11/26/19 0436 11/28/19 0427  WBC 7.1   < > 6.0 5.6 7.9  NEUTROABS 6.1  --  5.2 4.6  --   HGB 10.1*   < > 8.8* 9.4* 9.6*  HCT 32.9*   < > 29.1* 30.7* 31.4*  MCV 83.7   < > 84.6 83.7 85.3  PLT 257   < > 280 314 391   < > = values in this interval not displayed.   Lipid Recent Labs    12/19/18 1107  CHOL 147  HDL 92  LDLCALC 45  TRIG 50    Cardiac Enzymes: Recent Labs    11/19/19 0442 11/20/19 0528  CKTOTAL 1,074* 690*   BNP: Recent Labs    11/18/19 1405  BNP 296.3*   No results found for: [facility] Lab Results  Component Value Date   HGBA1C 5.8 (H) 11/23/2019   Lab Results  Component Value Date   TSH 1.775 11/19/2019   Lab Results  Component Value Date   VITAMINB12 2,766 (H) 11/25/2019   Lab Results  Component Value Date   FOLATE 18.5 11/25/2019   Lab Results  Component Value Date   IRON 34 11/25/2019   TIBC 213 (L) 11/25/2019   FERRITIN 191 11/25/2019    Imaging and Procedures obtained prior to SNF admission: CT Angio Chest PE W  and/or Wo Contrast  Result Date: 11/19/2019 CLINICAL DATA:  Extremity edema, elevated D-dimer EXAM: CT ANGIOGRAPHY CHEST WITH CONTRAST TECHNIQUE: Multidetector CT imaging of the chest was performed using the standard protocol during bolus administration of intravenous contrast. Multiplanar CT image reconstructions and MIPs were obtained to evaluate the vascular anatomy. CONTRAST:  163mL OMNIPAQUE IOHEXOL 350 MG/ML SOLN COMPARISON:  09/23/2015 FINDINGS: Cardiovascular: No filling defects in the pulmonary arteries to suggest pulmonary emboli. Heart is enlarged. Scattered aortic calcifications. No evidence of aortic aneurysm. Prominent central pulmonary arteries. Main pulmonary artery measures 38 mm. Findings compatible with pulmonary arterial hypertension. Mediastinum/Nodes: No mediastinal, hilar, or axillary adenopathy. Trachea and esophagus are unremarkable. Thyroid unremarkable. Lungs/Pleura: Mild elevation of the right hemidiaphragm. No confluent opacities, effusions or edema. Upper Abdomen: Imaging into the upper abdomen shows no acute findings. Musculoskeletal: No acute bony abnormality. Chest wall soft  tissues are unremarkable. Review of the MIP images confirms the above findings. IMPRESSION: Cardiomegaly.  Pulmonary arterial hypertension. No evidence of pulmonary embolus. Aortic Atherosclerosis (ICD10-I70.0). Electronically Signed   By: Rolm Baptise M.D.   On: 11/19/2019 03:57   MR BRAIN WO CONTRAST  Result Date: 11/18/2019 CLINICAL DATA:  Encephalopathy EXAM: MRI HEAD WITHOUT CONTRAST TECHNIQUE: Multiplanar, multiecho pulse sequences of the brain and surrounding structures were obtained without intravenous contrast. COMPARISON:  None. FINDINGS: Brain: There are new areas of hyperintense T2-weighted signal within the right occipital and anterior right temporal white matter. There is no abnormal diffusion restriction. Normal white matter signal. Normal volume of CSF spaces. numerous chronic  microhemorrhages in a predominantly peripheral distribution. Normal midline structures. Vascular: Normal flow voids. Skull and upper cervical spine: Normal marrow signal. Sinuses/Orbits: Negative. Other: None. IMPRESSION: 1. No acute ischemia or mass effect. 2. New areas of hyperintense T2-weighted signal within the right occipital and anterior right temporal white matter, which may be sequela of prior ischemia or trauma. However, this could also indicate vasogenic edema from metastatic disease. Further imaging with intravenous contrast is recommended to assess for the possibility of metastases. 3. Numerous chronic microhemorrhages in a predominantly peripheral distribution most consistent with cerebral amyloid angiopathy. Electronically Signed   By: Ulyses Jarred M.D.   On: 11/18/2019 20:14   MR BRAIN W CONTRAST  Result Date: 11/19/2019 CLINICAL DATA:  Encephalopathy. Abnormal noncontrast study yesterday, unable to rule out metastatic disease. EXAM: MRI HEAD WITH CONTRAST TECHNIQUE: Multiplanar, multiecho pulse sequences of the brain and surrounding structures were obtained with intravenous contrast. CONTRAST:  19mL GADAVIST GADOBUTROL 1 MMOL/ML IV SOLN COMPARISON:  11/18/2019 FINDINGS: Today's study does not show abnormal contrast enhancement of the brain or leptomeninges. Therefore there is no evidence of metastatic disease. In correlation with yesterday's study, I think that study showed a vasogenic edema pattern rather than cytotoxic edema pattern. We are seeing white matter edema with relative sparing of the overlying cortex. In this patient with multiple foci of hemosiderin deposition in the peripheral brain likely indicating amyloid angiopathy, the most likely diagnosis is either amyloid beta related angiitis or inflammatory cerebral amyloid angiopathy. IMPRESSION: Findings consistent with either amyloid beta related angiitis or inflammatory cerebral amyloid angiopathy. No evidence of metastatic disease.  I doubt the diagnoses of atypical PRES, prior ischemic infarction, prior trauma or PML, which were considered. Electronically Signed   By: Nelson Chimes M.D.   On: 11/19/2019 10:14   DG Chest Portable 1 View  Result Date: 11/18/2019 CLINICAL DATA:  Fever. EXAM: PORTABLE CHEST 1 VIEW COMPARISON:  PA and lateral chest 11/06/2016 and 05/31/2016. FINDINGS: The lungs are clear. Heart size is upper normal. Prominence of the pulmonary outflow tract compatible with pulmonary arterial hypertension noted. Atherosclerotic vascular disease is seen. No pneumothorax or pleural effusion. No acute or focal bony abnormality. IMPRESSION: No acute disease. Findings suggestive of pulmonary arterial hypertension. Atherosclerosis. Electronically Signed   By: Inge Rise M.D.   On: 11/18/2019 14:42     Not all labs, radiology exams or other studies done during hospitalization come through on my EPIC note; however they are reviewed by me.    Assessment and Plan  Amyloid angiopathy/Acute encephalopathy-treated with 5 days of IV steroids; neurology chose not to treat longer secondary to age mental status SNF-admitted for OT/PT.  Patient will need follow-up MRI with contrast in 2 to 4 weeks and follow-up at the stroke clinic with Dr. Leonie Man  AKI on CKD stage III-improved with  IV fluids initially; renal ultrasound showed small size kidneys with signs of atrophy; creatinine was stable; holding losartan Lasix and spironolactone on discharge with consideration of restarting losartan if creatinine is stable SNF-follow-up BMP 5 days  Right arm pain/swelling-venous Doppler ultrasound was negative for DVT  Hypertension -controlled for the moment on no meds; if starts to rise consider restarting losartan  Neuropathy SNF-not stated as uncontrolled; continue 100 mg twice daily and 200 mg nightly  Vitamin B12 deficiency SNF-continue replacement with 1000 mcg daily   Time spent greater than 45 minutes;> 50% of time with  patient was spent reviewing records, labs, tests and studies, counseling and developing plan of care  Hennie Duos, MD

## 2019-12-03 ENCOUNTER — Encounter: Payer: Self-pay | Admitting: Internal Medicine

## 2019-12-03 DIAGNOSIS — I680 Cerebral amyloid angiopathy: Secondary | ICD-10-CM | POA: Insufficient documentation

## 2019-12-03 DIAGNOSIS — E854 Organ-limited amyloidosis: Secondary | ICD-10-CM | POA: Insufficient documentation

## 2019-12-04 ENCOUNTER — Ambulatory Visit (INDEPENDENT_AMBULATORY_CARE_PROVIDER_SITE_OTHER): Payer: Medicare Other | Admitting: Neurology

## 2019-12-04 ENCOUNTER — Encounter: Payer: Self-pay | Admitting: Neurology

## 2019-12-04 ENCOUNTER — Other Ambulatory Visit: Payer: Self-pay

## 2019-12-04 VITALS — BP 160/80 | HR 85 | Temp 98.4°F

## 2019-12-04 DIAGNOSIS — R9089 Other abnormal findings on diagnostic imaging of central nervous system: Secondary | ICD-10-CM | POA: Diagnosis not present

## 2019-12-04 DIAGNOSIS — R410 Disorientation, unspecified: Secondary | ICD-10-CM | POA: Diagnosis not present

## 2019-12-04 NOTE — Progress Notes (Signed)
PATIENT: [name] DOB: [birth year]  Chief Complaint  Patient presents with  . Altered Mental Status    MMSE 27/30 - 5 animals. She is here with her daughter, [name]. Hospital follow up from acute encephalopathy.  She is temporarily at [facility].   Marland Kitchen PCP    Audley Hose, MD     HISTORICAL  [name] is a 86 year old female, seen in request by her primary care physician Dr. Maia Petties, [place] accompanied daughter [name] for evaluation of acute mental status change, initial evaluation was on Dec 04, 2019,  I have reviewed and summarized the referring note from the referring physician.  She had a history of due to glaucoma, cataract, failed left cornea transplant, being independently at her house, able to take care of her daily house chorces,, did have baseline mild gait abnormality, relies on her cane, mild lower extremity edema, also has obesity.  Daughter [name] reported, she was able to carry on a normal conversation with her on November 17, 2019, next day, April 20, she was found by her granddaughter confused, sitting in the chair with pains to her ankle, could not get up, generalized weakness, she is able to repeat  "I am [name]", initially refused to go to the emergency room, later family brought her to the emergency room,  At emergency room, UA was unremarkable, CT angiogram showed no evidence of PE, EKG showed sinus tachycardia, laboratory evaluation showed mild elevated BNP of 296, D-dimer of 2.05,  I personally reviewed MRI of the brain without contrast on November 22, 2019, confluent T2/flair hyperintensity in the posterior right parietal lobe, lateral right temporal lobe vasogenic edema,  MRI of the brain with contrast November 19, 2019, there was no contrast-enhancement identified, no evidence of metastatic disease.  MRI of the brain without contrast November 18, 2019, T2/flair hyperdensity within the right occipital, anterior right  temporal white matter, with evidence of vasogenic edema, numerous chronic microhemorrhage predominantly peripheral distribution most consistent with cerebral amyloid angiopathy.  CT angiogram of the chest: Cardiomegaly, pulmonary arterial hypertension, no evidence of PE Doppler study of bilateral lower extremity show no evidence of DVT  Fluoroscopy guided lumbar puncture November 21, 2019, CSF RBC 49, WBC 0, protein 80, glucose 79, IgG was elevated 8.9, Albumin mildly decreased 3.0, IgG index mildly elevated 0.8,  Laboratory evaluations in May 2021, potassium 5.4, creatinine 1.14, GFR of 50, CBC showed hemoglobin of 9.6, elevated RDW of 15.6, normal ferritin, folic acid, G31 5176, H6W 5.8, CPK 690, normal TSH, ammonia was mildly elevated 50, with normal lactic acid level, troponin was elevated 56,  She is now at rehabilitation since hospital admission, daughter reported 75% improvement, today's Mini-Mental Status Examination 27 out of 30, missed 3 out of 3 recalls, is able to ambulate with walker after receiving physical therapy, there was no seizure-like spells, no fever,  REVIEW OF SYSTEMS: Full 14 system review of systems performed and notable only for as above All other review of systems were negative.  ALLERGIES: Allergies  Allergen Reactions  . Penicillins Hives    Has patient had a PCN reaction causing immediate rash, facial/tongue/throat swelling, SOB or lightheadedness with hypotension: No Has patient had a PCN reaction causing severe rash involving mucus membranes or skin necrosis: No Has patient had a PCN reaction that required hospitalization No Has patient had a PCN reaction occurring within the last 10 years: No If all of the above answers are "NO", then may  proceed with Cephalosporin use.  . Tetanus Toxoid Hives    HOME MEDICATIONS: Current Outpatient Medications  Medication Sig Dispense Refill  . acetaminophen (TYLENOL) 500 MG tablet Take 500 mg by mouth at bedtime.    Marland Kitchen  aspirin EC 81 MG tablet Take 1 tablet (81 mg total) by mouth daily. 90 tablet 3  . CVS VITAMIN C 500 MG tablet Take 500 mg by mouth daily.    Marland Kitchen docusate sodium (COLACE) 50 MG capsule Take 1 capsule (50 mg total) by mouth 2 (two) times daily. 10 capsule 0  . gabapentin (NEURONTIN) 100 MG capsule Take 1 tablets in AM and 1 tablet at lunch and continue 2 tablets at bedtime 360 capsule 1  . Garlic 0762 MG CAPS Take 2,000 mg by mouth daily. Take 2 capsules to = 2000 mg    . Multiple Vitamin (MULTIVITAMIN WITH MINERALS) TABS Take 1 tablet by mouth daily.    Vladimir Faster Glycol-Propyl Glycol 0.4-0.3 % SOLN Place 1 drop into both eyes 3 (three) times daily as needed (for lubricating eyes).    . polyethylene glycol (MIRALAX / GLYCOLAX) 17 g packet Take 17 g by mouth 2 (two) times daily. Can decrease once having daily stools    . tetrahydrozoline 0.05 % ophthalmic solution Place 1 drop into both eyes daily as needed (eye irritation).    . trolamine salicylate (ASPERCREME) 10 % cream Apply 1 application topically as needed for muscle pain.    . vitamin B-12 (CYANOCOBALAMIN) 1000 MCG tablet Take 1,000 mcg by mouth daily.     No current facility-administered medications for this visit.    PAST MEDICAL HISTORY: Past Medical History:  Diagnosis Date  . CAP (community acquired pneumonia) 09/21/2015  . Chronic kidney disease, stage III (moderate)   . Encounter for long-term (current) use of other medications   . Glaucoma (increased eye pressure)   . Hard of hearing   . Head pain   . Hypertension   . Impaired glucose tolerance test   . Insomnia, unspecified   . Memory loss   . Morbid obesity ([place])   . Neck pain   . Osteoarthrosis, unspecified whether generalized or localized, other specified sites   . Other lymphedema   . Pain in joint, multiple sites   . Primary open-angle glaucoma(365.11)     PAST SURGICAL HISTORY: Past Surgical History:  Procedure Laterality Date  . ABDOMINAL HYSTERECTOMY    .  EYE SURGERY      FAMILY HISTORY: Family History  Problem Relation Age of Onset  . Other Mother        "old age"  . Stomach cancer Father   . Stomach cancer Sister   . Aneurysm Brother     SOCIAL HISTORY: Social History   Socioeconomic History  . Marital status: Widowed    Spouse name: Not on file  . Number of children: 1  . Years of education: nursing school  . Highest education level: Not on file  Occupational History  . Occupation: retired Corporate treasurer  Tobacco Use  . Smoking status: Never Smoker  . Smokeless tobacco: Never Used  Substance and Sexual Activity  . Alcohol use: No    Alcohol/week: 0.0 standard drinks  . Drug use: No  . Sexual activity: Not on file  Other Topics Concern  . Not on file  Social History Narrative   Patient lives at home alone , widow. Patient is retired and one child. 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   Right-handed.   No daily use of caffeine.      12/04/19 - She normally lives alone but she is currently in rehab at [facility] facility.      Social Determinants of Health   Financial Resource Strain:   . Difficulty of Paying Living Expenses:   Food Insecurity:   . Worried About Charity fundraiser in the Last Year:   . Arboriculturist in the Last Year:   Transportation Needs:   . Film/video editor (Medical):   Marland Kitchen Lack of Transportation (Non-Medical):   Physical Activity:   . Days of Exercise per Week:   . Minutes of Exercise per Session:   Stress:   . Feeling of Stress :   Social Connections:   . Frequency of Communication with Friends and Family:   . Frequency of Social Gatherings with Friends and Family:   . Attends Religious Services:   . Active Member of Clubs or Organizations:   . Attends Archivist Meetings:   Marland Kitchen Marital Status:   Intimate Partner Violence:   . Fear of Current or Ex-Partner:   . Emotionally Abused:   Marland Kitchen Physically Abused:   . Sexually Abused:      PHYSICAL  EXAM   Vitals:   12/04/19 0821  BP: (!) 160/80  Pulse: 85  Temp: 98.4 F (36.9 C)    Not recorded      There is no height or weight on file to calculate BMI.  PHYSICAL EXAMNIATION:  Gen: NAD, conversant, well nourised, well groomed                     Cardiovascular: Regular rate rhythm, no peripheral edema, warm, nontender. Eyes: Conjunctivae clear without exudates or hemorrhage Neck: Supple, no carotid bruits. Pulmonary: Clear to auscultation bilaterally   NEUROLOGICAL EXAM:  MENTAL STATUS: MMSE - Mini Mental State Exam 12/04/2019  Orientation to time 5  Orientation to Place 5  Registration 3  Attention/ Calculation 5  Recall 0  Language- name 2 objects 2  Language- repeat 1  Language- follow 3 step command 3  Language- read & follow direction 1  Write a sentence 1  Copy design 1  Total score 27     CRANIAL NERVES: CN II: Visual fields are full to confrontation.  Left pupil opaque, CN III, IV, VI: extraocular movement are normal. No ptosis. CN V: Facial sensation is intact to light touch CN VII: Face is symmetric with normal eye closure  CN VIII: Decreased hearing during causal conversation. CN IX, X: Phonation is normal. CN XI: Head turning and shoulder shrug are intact  MOTOR: Moves bilateral upper extremity with no difficulty, lower extremity movement because of her body habitus, bilateral lower extremity pitting edema, but felt there was no significant proximal distal muscle weakness,  REFLEXES: Reflexes are hypoactive and symmetric, plantar responses are flexor.  SENSORY: Limited due to lower extremity pitting edema  COORDINATION: There is no trunk or limb dysmetria noted.  GAIT/STANCE: Deferred  DIAGNOSTIC DATA (LABS, IMAGING, TESTING) - I reviewed patient records, labs, notes, testing and imaging myself where available.   ASSESSMENT AND PLAN  [name] is a 86 y.o. female   Sudden onset confusion, with abnormal MRI of brain,  vasogenic edema involving right parietal, temporal lobe, no contrast-enhancement, most compatible with amyloid related inflammation such as amyloid beta related angiitis, that was stable on repeat MRI of the brain on April 20,  and 24th,  Spinal fluid testing showed elevated total protein 80, with WBC 0  EEG,  May consider repeat MRI of brain with without contrast in 6 months  Continue rehabilitation  Laboratory evaluations, to rule out systemic inflammatory process,  Echocardiogram  Ultrasound of carotid artery   Marcial Pacas, M.D. Ph.D.  [facility] [address], [place], [place] [address] Ph: [phone] Fax: [phone]  CC: Audley Hose, MD

## 2019-12-09 LAB — PAN-ANCA
ANCA Proteinase 3: <3.5 U/mL (ref 0.0–3.5)
Atypical pANCA: <1:20 {titer}
C-ANCA: <1:20 {titer}
Myeloperoxidase Ab: <9 U/mL (ref 0.0–9.0)
P-ANCA: <1:20 {titer}

## 2019-12-09 LAB — SEDIMENTATION RATE: Sed Rate: 63 mm/h — ABNORMAL HIGH (ref 0–40)

## 2019-12-09 LAB — SYPHILIS: RPR W/REFLEX TO RPR TITER AND TREPONEMAL ANTIBODIES, TRADITIONAL SCREENING AND DIAGNOSIS ALGORITHM: RPR Ser Ql: NONREACTIVE

## 2019-12-09 LAB — C-REACTIVE PROTEIN: CRP: 20 mg/L — ABNORMAL HIGH (ref 0–10)

## 2019-12-09 LAB — ANA W/REFLEX IF POSITIVE: Anti Nuclear Antibody (ANA): NEGATIVE

## 2019-12-09 LAB — ANGIOTENSIN CONVERTING ENZYME: Angio Convert Enzyme: 49 U/L (ref 14–82)

## 2019-12-10 ENCOUNTER — Telehealth: Payer: Self-pay | Admitting: Neurology

## 2019-12-10 ENCOUNTER — Non-Acute Institutional Stay (SKILLED_NURSING_FACILITY): Payer: Medicare Other | Admitting: Internal Medicine

## 2019-12-10 ENCOUNTER — Encounter: Payer: Self-pay | Admitting: Internal Medicine

## 2019-12-10 DIAGNOSIS — R9089 Other abnormal findings on diagnostic imaging of central nervous system: Secondary | ICD-10-CM

## 2019-12-10 DIAGNOSIS — I680 Cerebral amyloid angiopathy: Secondary | ICD-10-CM | POA: Diagnosis not present

## 2019-12-10 DIAGNOSIS — R600 Localized edema: Secondary | ICD-10-CM | POA: Diagnosis not present

## 2019-12-10 DIAGNOSIS — I10 Essential (primary) hypertension: Secondary | ICD-10-CM

## 2019-12-10 DIAGNOSIS — R410 Disorientation, unspecified: Secondary | ICD-10-CM

## 2019-12-10 DIAGNOSIS — E854 Organ-limited amyloidosis: Secondary | ICD-10-CM

## 2019-12-10 NOTE — Telephone Encounter (Signed)
Please call patient, repeat laboratory evaluation showed elevated ESR, 63, was normal being less than 40, elevated C-reactive protein 20, with normal being less than 10, rest of the laboratory evaluation showed no significant abnormality  Above findings could suggest of systemic inflammatory process, I have put repeat order in the system, she can come in first week of June for repeat laboratory evaluation, also ordered repeat MRI of the brain earlier than previously discussed due to abnormal laboratory evaluations.

## 2019-12-10 NOTE — Telephone Encounter (Signed)
[employer] medicare order sent to GI. No auth they will reach out to the patient to schedule.  

## 2019-12-10 NOTE — Telephone Encounter (Signed)
I called pt. No answer, left a message asking pt to call me back.   

## 2019-12-10 NOTE — Telephone Encounter (Signed)
[name] the P.A.called stating that the pt is in [facility] and he states that the lab results should be faxed over to them and any other communication that pertains to her needs to be communicated to her at [employer] number [phone] Fax [phone]

## 2019-12-10 NOTE — Progress Notes (Signed)
Location:    [facility] Room Number: 621/H Place of Service:  SNF [phone]) Provider:  Diana Eves, MD  Patient Care Team: Audley Hose, MD as PCP - General (Internal Medicine)  Extended Emergency Contact Information Primary Emergency Contact: [facility] Address: [address]          [place], [place] [address] [place] Phone: [phone] Relation: Daughter Secondary Emergency Contact: Carroll Sage Address: [address]          [place], [place] [address] [place] of [place] Mobile Phone: [phone] Relation: Granddaughter  Code Status:  DNR Goals of care: Advanced Directive information Advanced Directives 12/10/2019  Does Patient Have a Medical Advance Directive? Yes  Type of Advance Directive Out of facility DNR (pink MOST or yellow form)  Does patient want to make changes to medical advance directive? No - Patient declined  Copy of [place] in Chart? -  Would patient like information on creating a medical advance directive? -  Pre-existing out of facility DNR order (yellow form or pink MOST form) Yellow form placed in chart (order not valid for inpatient use)     Chief Complaint  Patient presents with  . Acute Visit    Edema    HPI:  Pt is a 86 y.o. female seen today for an acute visit for follow-up of edema. She has a history of chronic lower extremity edema.  She is here for rehab after she was found confused and unable to walk. MRI of the brain showed an abnormality in the right occipital and temporal areas there appear to be cerebral amyloid angiopathy.  Neurology was consulted since there was some evidence of edema on the MRI IV steroids were ordered.  Neurology recommended against using continued steroids secondary to her age.  Recommended follow-up with neurology and in fact she has seen them they have ordered labs and would like to see her again in early  June for another MRI-apparently there are thoughts there may be an inflammatory process  Her hospital course was somewhat complicated with acute on chronic kidney disease that improved with IV fluids-decision was made to hold her losartan and Lasix and spironolactone on discharge.  Nursing staff feels she may have some gradually increasing edema-speaking with patient she is not complaining of any increased shortness of breath or discomfort but does state that she has benefited from diuretics in the past.  It appears her renal function has stabilized with a creatinine of 0.87 BUN of 17.6 on lab done on May 5.     Past Medical History:  Diagnosis Date  . CAP (community acquired pneumonia) 09/21/2015  . Chronic kidney disease, stage III (moderate)   . Encounter for long-term (current) use of other medications   . Glaucoma (increased eye pressure)   . Hard of hearing   . Head pain   . Hypertension   . Impaired glucose tolerance test   . Insomnia, unspecified   . Memory loss   . Morbid obesity ([facility])   . Neck pain   . Osteoarthrosis, unspecified whether generalized or localized, other specified sites   . Other lymphedema   . Pain in joint, multiple sites   . Primary open-angle glaucoma(365.11)    Past Surgical History:  Procedure Laterality Date  . ABDOMINAL HYSTERECTOMY    . EYE SURGERY      Allergies  Allergen Reactions  . Penicillins Hives    Has patient  had a PCN reaction causing immediate rash, facial/tongue/throat swelling, SOB or lightheadedness with hypotension: No Has patient had a PCN reaction causing severe rash involving mucus membranes or skin necrosis: No Has patient had a PCN reaction that required hospitalization No Has patient had a PCN reaction occurring within the last 10 years: No If all of the above answers are "NO", then may proceed with Cephalosporin use.  . Tetanus Toxoid Hives    Outpatient Encounter Medications as of 12/10/2019  Medication Sig  .  acetaminophen (TYLENOL) 500 MG tablet Take 500 mg by mouth at bedtime.  Marland Kitchen aspirin EC 81 MG tablet Take 1 tablet (81 mg total) by mouth daily.  . Carboxymethylcellulose Sodium (ARTIFICIAL TEARS OP) Place 1 drop into both eyes 3 (three) times daily as needed. for lubercating eyes  . CVS VITAMIN C 500 MG tablet Take 500 mg by mouth daily.  Marland Kitchen docusate sodium (COLACE) 50 MG capsule Take 1 capsule (50 mg total) by mouth 2 (two) times daily.  . furosemide (LASIX) 20 MG tablet Take 20 mg by mouth every Monday, Wednesday, and Friday.  . gabapentin (NEURONTIN) 100 MG capsule Take 1 tablets in AM and 1 tablet at lunch and continue 2 tablets at bedtime  . gabapentin (NEURONTIN) 100 MG capsule Take 200 mg by mouth at bedtime.  . Garlic 6644 MG CAPS Take 2,000 mg by mouth daily. Take 2 capsules to = 2000 mg  . Multiple Vitamin (MULTIVITAMIN WITH MINERALS) TABS Take 1 tablet by mouth daily.  . NON FORMULARY Diet Order: Regular NAS, Heart Healthy Diet, thin liquids.  . polyethylene glycol (MIRALAX / GLYCOLAX) 17 g packet Take 17 g by mouth 2 (two) times daily. Can decrease once having daily stools  . tetrahydrozoline 0.05 % ophthalmic solution Place 1 drop into both eyes daily as needed (eye irritation).  . trolamine salicylate (ASPERCREME) 10 % cream Apply 1 application topically as needed for muscle pain.  . vitamin B-12 (CYANOCOBALAMIN) 1000 MCG tablet Take 1,000 mcg by mouth daily.  . [DISCONTINUED] Polyethyl Glycol-Propyl Glycol 0.4-0.3 % SOLN Place 1 drop into both eyes 3 (three) times daily as needed (for lubricating eyes).   No facility-administered encounter medications on file as of 12/10/2019.    Review of Systems   General she is not complaining of any fever or chills.  Skin does not complain of rashes itching or diaphoresis.  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  Cardiac does not complain of chest pain does have some gradually increasing edema per nursing.  GI is not complaining of abdominal discomfort nausea vomiting diarrhea constipation.  GU does not complain of dysuria.  Musculoskeletal continues with lower extremity weakness does not complain of pain however.  Neurologic is positive for weakness does not complain of dizziness headache or syncope or numbness.  Insight not complaining of being depressed or anxious  Immunization History  Administered Date(s) Administered  . Pneumococcal Conjugate-13 01/22/2017  . Pneumococcal Polysaccharide-23 02/18/2018   Pertinent  Health Maintenance Due  Topic Date Due  . INFLUENZA VACCINE  02/29/2020  . DEXA SCAN  Completed  . PNA vac Low Risk Adult  Completed   Fall Risk  09/09/2019 08/11/2019 03/24/2019 08/29/2018 02/18/2018  Falls in the past year? 0 0 0 0 No  Number falls in past yr: - - - - -  Injury with Fall? - - - - -  Risk for fall due to : - Impaired vision Impaired mobility;Impaired vision Impaired mobility;Impaired vision -   Functional Status Survey:    Vitals:   12/10/19 1614  BP: 125/70  Pulse: 74  Resp: 18  Temp: 98 F (36.7 C)  TempSrc: Oral  SpO2: 98%  Weight: 172 lb (78 kg)  Height: 4\' 10"  (1.473 m)   Body mass index is 35.95 kg/m. Physical Exam   In general this is a pleasant elderly female in no distress sitting comfortably in her chair.  Her skin is warm and dry.   Eyes-does have left eye blindness..  Oropharynx clear mucous membranes moist.  Chest is clear to auscultation there is no labored breathing.  Heart is regular rate and rhythm with an occasional irregular beat she has is a 2+ lower extremity edema this is cool to touch nonerythematous nontender.  Abdomen is obese soft nontender with positive bowel sounds.  Musculoskeletal is able to move all her extremities but does have left-sided weakness-.  Neurologic as noted above her speech is  clear  Psych she is alert and oriented pleasant and appropriate      Labs reviewed:  Dec 03, 2019.  WBC 4.5 hemoglobin 9.0 platelets 323.  Sodium 143 potassium 4.7 BUN 17.6 creatinine 0.87.   Recent Labs    11/24/19 0504 11/24/19 0504 11/25/19 0447 11/25/19 0447 11/26/19 0436 11/27/19 0826 11/27/19 1633 11/28/19 0427 11/29/19 0816  NA 139   < > 136   < > 138   < > 138 138 140  K 5.3*   < > 4.5   < > 4.6   < > 4.2 4.5 5.4*  CL 106   < > 107   < > 108   < > 108 110 109  CO2 25   < > 23   < > 24   < > 23 24 26   GLUCOSE 152*   < > 156*   < > 177*   < > 151* 113* 86  BUN 45*   < > 44*   < > 46*   < > 43* 48* 41*  CREATININE 1.28*   < > 1.12*   < > 1.19*   < > 1.17* 1.31* 1.14*  CALCIUM 8.5*   < > 7.8*   < > 8.1*   < > 8.1* 8.0* 8.3*  MG 2.5*  --  2.4  --  2.6*  --   --   --   --   PHOS 2.7  --  2.8  --  3.2  --   --   --   --    < > = values in this interval not displayed.   Recent Labs    11/24/19 0504 11/25/19 0447 11/26/19 0436  AST 74* 40 25  ALT 70* 56* 49*  ALKPHOS 76 72 75  BILITOT 0.5 0.1* 0.2*  PROT 6.3* 5.3* 5.4*  ALBUMIN 2.2* 1.8* 1.9*   Recent Labs    11/24/19 0504 11/24/19 0504 11/25/19 0447 11/26/19 0436 11/28/19 0427  WBC 7.1   < > 6.0 5.6 7.9  NEUTROABS 6.1  --  5.2 4.6  --   HGB 10.1*   < > 8.8* 9.4* 9.6*  HCT 32.9*   < > 29.1* 30.7* 31.4*  MCV 83.7   < > 84.6 83.7 85.3  PLT 257   < > 280 314 391   < > = values in this interval  not displayed.   Lab Results  Component Value Date   TSH 1.775 11/19/2019   Lab Results  Component Value Date   HGBA1C 5.8 (H) 11/23/2019   Lab Results  Component Value Date   CHOL 147 12/19/2018   HDL 92 12/19/2018   LDLCALC 45 12/19/2018   TRIG 50 12/19/2018   CHOLHDL 1.6 12/19/2018    Significant Diagnostic Results in last 30 days:  DG Elbow 2 Views Right  Result Date: 11/22/2019 CLINICAL DATA:  Atraumatic right elbow pain. EXAM: RIGHT ELBOW - 2 VIEW COMPARISON:  None. FINDINGS: There is no  evidence of acute fracture, dislocation, or joint effusion. There is no evidence of arthropathy or other focal bone abnormality. Thin curvilinear soft tissue calcification is seen adjacent to the right olecranon process and lateral epicondyle of the distal right humerus. IMPRESSION: 1. No acute osseous abnormality. 2. Thin curvilinear soft tissue calcification adjacent to the right olecranon process and lateral epicondyle of the distal right humerus. This may represent calcific tendinopathy. Electronically Signed   By: Virgina Norfolk M.D.   On: 11/22/2019 19:45   CT Angio Chest PE W and/or Wo Contrast  Result Date: 11/19/2019 CLINICAL DATA:  Extremity edema, elevated D-dimer EXAM: CT ANGIOGRAPHY CHEST WITH CONTRAST TECHNIQUE: Multidetector CT imaging of the chest was performed using the standard protocol during bolus administration of intravenous contrast. Multiplanar CT image reconstructions and MIPs were obtained to evaluate the vascular anatomy. CONTRAST:  135mL OMNIPAQUE IOHEXOL 350 MG/ML SOLN COMPARISON:  09/23/2015 FINDINGS: Cardiovascular: No filling defects in the pulmonary arteries to suggest pulmonary emboli. Heart is enlarged. Scattered aortic calcifications. No evidence of aortic aneurysm. Prominent central pulmonary arteries. Main pulmonary artery measures 38 mm. Findings compatible with pulmonary arterial hypertension. Mediastinum/Nodes: No mediastinal, hilar, or axillary adenopathy. Trachea and esophagus are unremarkable. Thyroid unremarkable. Lungs/Pleura: Mild elevation of the right hemidiaphragm. No confluent opacities, effusions or edema. Upper Abdomen: Imaging into the upper abdomen shows no acute findings. Musculoskeletal: No acute bony abnormality. Chest wall soft tissues are unremarkable. Review of the MIP images confirms the above findings. IMPRESSION: Cardiomegaly.  Pulmonary arterial hypertension. No evidence of pulmonary embolus. Aortic Atherosclerosis (ICD10-I70.0). Electronically  Signed   By: Rolm Baptise M.D.   On: 11/19/2019 03:57   MR BRAIN WO CONTRAST  Result Date: 11/22/2019 CLINICAL DATA:  86 year old female with recent altered mental status, cephalopathy. Constellation of findings on recent MRIs most compatible with inflammatory disease related to amyloid angiopathy (e.g. amyloid beta related angiitis). EXAM: MRI HEAD WITHOUT CONTRAST TECHNIQUE: Multiplanar, multiecho pulse sequences of the brain and surrounding structures were obtained without intravenous contrast. COMPARISON:  Brain MRI with contrast 11/19/2019 and without contrast 11/18/2019. FINDINGS: Brain: Continued confluent T2 and FLAIR hyperintensity in the posterior right parietal lobe, lateral right temporal lobe in a vasogenic edema pattern. No change compared to 11/18/2019. Diffusion weighted imaging remains negative. T2* imaging rather than SWI performed today, but again demonstrates evidence of punctate microhemorrhages with the relatively subcortical distribution in the affected areas and also the contralateral left parietal lobe. Stable gray and white matter signal elsewhere. Possible chronic encephalomalacia in the dorsal cerebellum, not significantly changed from a 2014 MRI. No new signal abnormality identified. No restricted diffusion to suggest acute infarction. No midline shift, mass effect, ventriculomegaly, extra-axial collection or acute intracranial hemorrhage. Cervicomedullary junction and pituitary are within normal limits. Vascular: Major intracranial vascular flow voids appear stable. Skull and upper cervical spine: Widespread advanced cervical spine degeneration appears stable from the recent MRI. Visualized bone  marrow signal is within normal limits. Sinuses/Orbits: Stable and negative. Other: Mastoids remain clear. IMPRESSION: 1. Unchanged MRI appearance of the brain since 11/18/2019, with areas of vasogenic edema in the right parietal lobe and left temporal lobe most compatible with amyloid  related inflammation such as ABRA (amyloid beta related angitis). 2. No new intracranial abnormality. Electronically Signed   By: Genevie Ann M.D.   On: 11/22/2019 14:02   MR BRAIN WO CONTRAST  Result Date: 11/18/2019 CLINICAL DATA:  Encephalopathy EXAM: MRI HEAD WITHOUT CONTRAST TECHNIQUE: Multiplanar, multiecho pulse sequences of the brain and surrounding structures were obtained without intravenous contrast. COMPARISON:  None. FINDINGS: Brain: There are new areas of hyperintense T2-weighted signal within the right occipital and anterior right temporal white matter. There is no abnormal diffusion restriction. Normal white matter signal. Normal volume of CSF spaces. numerous chronic microhemorrhages in a predominantly peripheral distribution. Normal midline structures. Vascular: Normal flow voids. Skull and upper cervical spine: Normal marrow signal. Sinuses/Orbits: Negative. Other: None. IMPRESSION: 1. No acute ischemia or mass effect. 2. New areas of hyperintense T2-weighted signal within the right occipital and anterior right temporal white matter, which may be sequela of prior ischemia or trauma. However, this could also indicate vasogenic edema from metastatic disease. Further imaging with intravenous contrast is recommended to assess for the possibility of metastases. 3. Numerous chronic microhemorrhages in a predominantly peripheral distribution most consistent with cerebral amyloid angiopathy. Electronically Signed   By: Ulyses Jarred M.D.   On: 11/18/2019 20:14   MR BRAIN W CONTRAST  Result Date: 11/19/2019 CLINICAL DATA:  Encephalopathy. Abnormal noncontrast study yesterday, unable to rule out metastatic disease. EXAM: MRI HEAD WITH CONTRAST TECHNIQUE: Multiplanar, multiecho pulse sequences of the brain and surrounding structures were obtained with intravenous contrast. CONTRAST:  18mL GADAVIST GADOBUTROL 1 MMOL/ML IV SOLN COMPARISON:  11/18/2019 FINDINGS: Today's study does not show abnormal contrast  enhancement of the brain or leptomeninges. Therefore there is no evidence of metastatic disease. In correlation with yesterday's study, I think that study showed a vasogenic edema pattern rather than cytotoxic edema pattern. We are seeing white matter edema with relative sparing of the overlying cortex. In this patient with multiple foci of hemosiderin deposition in the peripheral brain likely indicating amyloid angiopathy, the most likely diagnosis is either amyloid beta related angiitis or inflammatory cerebral amyloid angiopathy. IMPRESSION: Findings consistent with either amyloid beta related angiitis or inflammatory cerebral amyloid angiopathy. No evidence of metastatic disease. I doubt the diagnoses of atypical PRES, prior ischemic infarction, prior trauma or PML, which were considered. Electronically Signed   By: Nelson Chimes M.D.   On: 11/19/2019 10:14   US RENAL  Result Date: 11/27/2019 CLINICAL DATA:  Acute kidney injury EXAM: RENAL / URINARY TRACT ULTRASOUND COMPLETE COMPARISON:  Ultrasound 12/16/2003 FINDINGS: Right Kidney: Renal measurements: 6.1 by 3.1 x 4.3 cm = volume: 42.3 mL . Echogenicity within normal limits. No mass or hydronephrosis visualized. Left Kidney: Renal measurements: 8.2 x 4.5 x 5.6 cm = volume: 105.4 mL. Echogenicity within normal limits. No mass or hydronephrosis visualized. Bladder: Appears normal for degree of bladder distention. Other: None. IMPRESSION: Somewhat small size of the kidneys, right renal length smaller than the left, suggesting a component of atrophy. There is no hydronephrosis. Electronically Signed   By: Donavan Foil M.D.   On: 11/27/2019 19:42   US Venous Img Lower Bilateral (DVT)  Result Date: 11/15/2019 CLINICAL DATA:  86 year old female with a history of edema EXAM: BILATERAL LOWER EXTREMITY VENOUS DOPPLER  ULTRASOUND TECHNIQUE: Gray-scale sonography with graded compression, as well as color Doppler and duplex ultrasound were performed to evaluate the  lower extremity deep venous systems from the level of the common femoral vein and including the common femoral, femoral, profunda femoral, popliteal and calf veins including the posterior tibial, peroneal and gastrocnemius veins when visible. The superficial great saphenous vein was also interrogated. Spectral Doppler was utilized to evaluate flow at rest and with distal augmentation maneuvers in the common femoral, femoral and popliteal veins. COMPARISON:  None. FINDINGS: RIGHT LOWER EXTREMITY Common Femoral Vein: No evidence of thrombus. Normal compressibility, respiratory phasicity and response to augmentation. Saphenofemoral Junction: No evidence of thrombus. Normal compressibility and flow on color Doppler imaging. Profunda Femoral Vein: No evidence of thrombus. Normal compressibility and flow on color Doppler imaging. Femoral Vein: No evidence of thrombus. Normal compressibility, respiratory phasicity and response to augmentation. Popliteal Vein: No evidence of thrombus. Normal compressibility, respiratory phasicity and response to augmentation. Calf Veins: Calf veins not well visualized. Superficial Great Saphenous Vein: No evidence of thrombus. Normal compressibility and flow on color Doppler imaging. Other Findings:  Edema LEFT LOWER EXTREMITY Common Femoral Vein: No evidence of thrombus. Normal compressibility, respiratory phasicity and response to augmentation. Saphenofemoral Junction: No evidence of thrombus. Normal compressibility and flow on color Doppler imaging. Profunda Femoral Vein: No evidence of thrombus. Normal compressibility and flow on color Doppler imaging. Femoral Vein: No evidence of thrombus. Normal compressibility, respiratory phasicity and response to augmentation. Popliteal Vein: No evidence of thrombus. Normal compressibility, respiratory phasicity and response to augmentation. Calf Veins: Calf veins not well visualized. Superficial Great Saphenous Vein: No evidence of thrombus.  Normal compressibility and flow on color Doppler imaging. Other Findings:  Edema IMPRESSION: Sonographic survey of the bilateral lower extremities negative for DVT. Bilateral lower extremity edema Electronically Signed   By: Corrie Mckusick D.O.   On: 11/15/2019 14:04   DG CHEST PORT 1 VIEW  Result Date: 11/26/2019 CLINICAL DATA:  Altered mental status. EXAM: PORTABLE CHEST 1 VIEW COMPARISON:  Chest radiograph 11/18/2019. FINDINGS: Monitoring leads overlie the patient. Stable enlarged cardiac and mediastinal contours. Aortic atherosclerosis. Pulmonary arterial enlargement. Elevation right hemidiaphragm. No large area pulmonary consolidation. No pleural effusion or pneumothorax. Thoracic spine degenerative changes. IMPRESSION: No acute cardiopulmonary process. Electronically Signed   By: Lovey Newcomer M.D.   On: 11/26/2019 15:01   DG Chest Portable 1 View  Result Date: 11/18/2019 CLINICAL DATA:  Fever. EXAM: PORTABLE CHEST 1 VIEW COMPARISON:  PA and lateral chest 11/06/2016 and 05/31/2016. FINDINGS: The lungs are clear. Heart size is upper normal. Prominence of the pulmonary outflow tract compatible with pulmonary arterial hypertension noted. Atherosclerotic vascular disease is seen. No pneumothorax or pleural effusion. No acute or focal bony abnormality. IMPRESSION: No acute disease. Findings suggestive of pulmonary arterial hypertension. Atherosclerosis. Electronically Signed   By: Inge Rise M.D.   On: 11/18/2019 14:42   DG Abd Portable 1V  Result Date: 11/28/2019 CLINICAL DATA:  Abdominal pain and constipation. EXAM: PORTABLE ABDOMEN - 1 VIEW COMPARISON:  Ultrasound 12/16/2003 report. FINDINGS: Soft tissue structures are unremarkable. Stool noted throughout the colon suggesting constipation. No bowel distention or free air. Calcification noted over the liver and spleen suggesting granulomas. Calcific densities noted over the right abdomen possibly phleboliths. Ureteral stones cannot be excluded.  Degenerative changes and scoliosis lumbar spine. Degenerative changes both hips. IMPRESSION: 1. Stool noted throughout the colon suggesting constipation. No bowel distention. 2. Calcific densities noted over the right abdomen, possibly phleboliths. Right  ureteral stones cannot be excluded. Clinical correlation suggested. Electronically Signed   By: Marcello Moores  Register   On: 11/28/2019 16:05   VAS [place] UPPER EXTREMITY VENOUS DUPLEX  Result Date: 11/24/2019 UPPER VENOUS STUDY  Indications: Edema Comparison Study: No prior study Performing Technologist: Maudry Mayhew MHA, RDMS, RVT, RDCS  Examination Guidelines: A complete evaluation includes B-mode imaging, spectral Doppler, color Doppler, and power Doppler as needed of all accessible portions of each vessel. Bilateral testing is considered an integral part of a complete examination. Limited examinations for reoccurring indications may be performed as noted.  Right Findings: +----------+------------+---------+-----------+----------+-------+ RIGHT     CompressiblePhasicitySpontaneousPropertiesSummary +----------+------------+---------+-----------+----------+-------+ IJV           Full       Yes       Yes                      +----------+------------+---------+-----------+----------+-------+ Subclavian    Full       Yes       Yes                      +----------+------------+---------+-----------+----------+-------+ Axillary      Full       Yes       Yes                      +----------+------------+---------+-----------+----------+-------+ Brachial      Full       Yes       Yes                      +----------+------------+---------+-----------+----------+-------+ Radial        Full                                          +----------+------------+---------+-----------+----------+-------+ Ulnar         Full                                          +----------+------------+---------+-----------+----------+-------+  Cephalic      Full                                          +----------+------------+---------+-----------+----------+-------+ Basilic       Full                                          +----------+------------+---------+-----------+----------+-------+  Left Findings: +----------+------------+---------+-----------+----------+-------+ LEFT      CompressiblePhasicitySpontaneousPropertiesSummary +----------+------------+---------+-----------+----------+-------+ Subclavian               Yes       Yes                      +----------+------------+---------+-----------+----------+-------+  Summary:  Right: No evidence of deep vein thrombosis in the upper extremity. No evidence of superficial vein thrombosis in the upper extremity.  Left: No evidence of thrombosis in the subclavian.  *See table(s) above for measurements and observations.  Diagnosing physician: Harold Barban MD Electronically signed by Harold Barban MD on 11/24/2019 at  8:55:10 AM.    Final    DG FLUORO GUIDE LUMBAR PUNCTURE  Result Date: 11/21/2019 CLINICAL DATA:  Encephalopathy. Patient presents for fluoroscopically guided lumbar puncture for CSF acquisition. EXAM: DIAGNOSTIC LUMBAR PUNCTURE UNDER FLUOROSCOPIC GUIDANCE FLUOROSCOPY TIME:  Fluoroscopy Time:  40 seconds Radiation Exposure Index (if provided by the fluoroscopic device): 13.7 mGy Number of Acquired Spot Images: 2 PROCEDURE: Informed consent was obtained from the patient prior to the procedure, including potential complications of headache, allergy, and pain. With the patient prone, the lower back was prepped with Betadine. 1% Lidocaine was used for local anesthesia. Lumbar puncture was performed at the L3-L4 level using a 20 gauge spinal needle with return of initially slightly cloudy then clear CSF. Opening pressure was not obtained due to difficulty with patient positioning and to expedite the exam to limit the time the patient was needing to be prone. Twelve  ml of CSF were obtained for laboratory studies. The patient tolerated the procedure well and there were no apparent complications. IMPRESSION: 1. Successful fluoroscopically guided lumbar puncture for CSF acquisition. Electronically Signed   By: Lajean Manes M.D.   On: 11/21/2019 15:41    Assessment/Plan   #1 history of edema-there is chronicity to this-it appears her diuretics were held in the hospital including Lasix and spironolactone secondary to renal issues which appear to have improved.  Last creatinine was 0.87-we will cautiously restart Lasix on Monday Wednesday Fridays 20 mg and update a metabolic panel next week.  This is complicated with a history of low albumin it was 1.9 it appears on recent hospital lab-  At this point continue to monitor clinically.  2.  History of altered mental status-encephalopathy-amyloid angiopathy--Per nursing she has returned largely to her baseline which is encouraging-she is followed by neurology they have done labs-per review of neurology notation sed rate is somewhat elevated at 63 and C-reactive protein is elevated as well-there are thought she may have an inflammatory process per neurology opinion-and they would like to see her back in the clinic in early June for another MRI-and further labs-nursing is aware of this.  Clinically she appears to be stable continues to be pleasant cooperative and appropriate.  3.  History of hypertension-this appears to be controlled despite being off the losartan-at this point will monitor recent blood pressures 122/74-125/70-121/75   [id number]

## 2019-12-10 NOTE — Telephone Encounter (Signed)
I printed Dr. Rhea Belton recommendation and have faxed to [facility] per request.

## 2019-12-10 NOTE — Telephone Encounter (Signed)
Attempted to reach the pt, VM was full and not accepting new messages. Will try again at a later time.

## 2019-12-15 ENCOUNTER — Other Ambulatory Visit: Payer: Self-pay | Admitting: Internal Medicine

## 2019-12-15 ENCOUNTER — Non-Acute Institutional Stay (SKILLED_NURSING_FACILITY): Payer: Medicare Other | Admitting: Internal Medicine

## 2019-12-15 DIAGNOSIS — I10 Essential (primary) hypertension: Secondary | ICD-10-CM

## 2019-12-15 DIAGNOSIS — E854 Organ-limited amyloidosis: Secondary | ICD-10-CM | POA: Diagnosis not present

## 2019-12-15 DIAGNOSIS — R202 Paresthesia of skin: Secondary | ICD-10-CM

## 2019-12-15 DIAGNOSIS — I680 Cerebral amyloid angiopathy: Secondary | ICD-10-CM

## 2019-12-15 DIAGNOSIS — R600 Localized edema: Secondary | ICD-10-CM

## 2019-12-15 DIAGNOSIS — G629 Polyneuropathy, unspecified: Secondary | ICD-10-CM

## 2019-12-15 DIAGNOSIS — K5900 Constipation, unspecified: Secondary | ICD-10-CM

## 2019-12-15 NOTE — Progress Notes (Signed)
This is a discharge note.  Level of care skilled.  Facility is [facility].  Chief complaint discharge note.  History of present illness.  Patient is a pleasant 86 year old female who is slated for discharge later this week.  She was admitted here for rehab after she was hospitalized for weakness and confusion.  Initially before her hospitalization she was confused and unable to walk MRI of the brain showed an abnormality in the right occipital and temporal area thought to be a cerebral amyloid angiopathy.  Neurology was consulted and since there was some evidence of edema IV steroids were ordered.  Neurology did recommend against using continued steroids because of her advanced age.  There is follow-up with neurology recommended-they have seen her and they would like to see her again in early June for another MRI-apparently there are thought there is an inflammatory process and they would like to follow this up.  Hospital course was complicated with acute on chronic renal disease-this improved with IV fluids and her Lasix losartan and spironolactone were held.  It was thought she did have some gradually increasing edema and so we have started her on Lasix 20 mg 3 days a week.  Per nursing staff the edema appears stabilized-she is not complaining of any chest pain or shortness of breath-her renal function appears to be stable--with creatinine on Dec 11, 2019 of 0.69.  We will update this before discharge.  Regards to hypertension this appears stable she is not really on any medications currently but recent blood pressures appear to be stable most recently 132/78-133/59.  She is looking forward to going home-she will need continued PT and OT as well as nursing assistance.  Past Medical History:  Diagnosis Date  . CAP (community acquired pneumonia) 09/21/2015  . Chronic kidney disease, stage III (moderate)   . Encounter for long-term (current) use of other medications   .  Glaucoma (increased eye pressure)   . Hard of hearing   . Head pain   . Hypertension   . Impaired glucose tolerance test   . Insomnia, unspecified   . Memory loss   . Morbid obesity ([place])   . Neck pain   . Osteoarthrosis, unspecified whether generalized or localized, other specified sites   . Other lymphedema   . Pain in joint, multiple sites   . Primary open-angle glaucoma(365.11)    Past Surgical History:  Procedure Laterality Date  . ABDOMINAL HYSTERECTOMY    . EYE SURGERY           Allergies  Allergen Reactions  . Penicillins Hives    Has patient had a PCN reaction causing immediate rash, facial/tongue/throat swelling, SOB or lightheadedness with hypotension: No Has patient had a PCN reaction causing severe rash involving mucus membranes or skin necrosis: No Has patient had a PCN reaction that required hospitalization No Has patient had a PCN reaction occurring within the last 10 years: No If all of the above answers are "NO", then may proceed with Cephalosporin use.  . Tetanus Toxoid Hives        MEDICATIONS   Medication Sig  . acetaminophen (TYLENOL) 500 MG tablet Take 500 mg by mouth at bedtime.  Marland Kitchen aspirin EC 81 MG tablet Take 1 tablet (81 mg total) by mouth daily.  . Carboxymethylcellulose Sodium (ARTIFICIAL TEARS OP) Place 1 drop into both eyes 3 (three) times daily as needed. for lubercating eyes  . CVS VITAMIN C 500 MG tablet Take 500 mg by mouth daily.  Marland Kitchen  docusate sodium (COLACE) 50 MG capsule Take 1 capsule (50 mg total) by mouth 2 (two) times daily.  . furosemide (LASIX) 20 MG tablet Take 20 mg by mouth every Monday, Wednesday, and Friday.  . gabapentin (NEURONTIN) 100 MG capsule Take 1 tablets in AM and 1 tablet at lunch and continue 2 tablets at bedtime  . gabapentin (NEURONTIN) 100 MG capsule Take 200 mg by mouth at bedtime.  . Garlic 2951 MG CAPS Take 2,000 mg by mouth daily. Take 2 capsules to = 2000 mg  . Multiple Vitamin  (MULTIVITAMIN WITH MINERALS) TABS Take 1 tablet by mouth daily.  . NON FORMULARY Diet Order: Regular NAS, Heart Healthy Diet, thin liquids.  . polyethylene glycol (MIRALAX / GLYCOLAX) 17 g packet Take 17 g by mouth 2 (two) times daily. Can decrease once having daily stools  . tetrahydrozoline 0.05 % ophthalmic solution Place 1 drop into both eyes daily as needed (eye irritation).  . trolamine salicylate (ASPERCREME) 10 % cream Apply 1 application topically as needed for muscle pain.  . vitamin B-12 (CYANOCOBALAMIN) 1000 MCG tablet Take 1,000 mcg by mouth daily.  . [DISCONTINUED] Polyethyl Glycol-Propyl Glycol 0.4-0.3 % SOLN Place 1 drop into both eyes 3 (three) times daily as needed (for lubricating eyes).   Review of systems.  In general she not complaining of any fever or chills.  Skin does not complain of rashes or itching.  Head ears eyes nose mouth and throat she does have a history of left eye blindness as well as being deaf in the left ear.  Respiratory does not complain of being short of breath or having a cough.  Cardiac does not complain of chest pain appears to have relatively baseline edema this appears to have stabilized with the low-dose Lasix.  GI is not complaining of abdominal pain nausea vomiting diarrhea constipation.  GU does not complain of dysuria.  Musculoskeletal at this point does not complain of pain.  Neurologic she does have some mild left-sided weakness compared to right she does not complain of syncope dizziness or increasing numbness-she does have a history of neuropathy.  And psych does not complain of being depressed or anxious is looking forward to going home.    Physical exam.  Temperature is 98.1 pulse 78 respirations 18 blood pressure 132/78.  Weight is 172 pounds.  In general this is a pleasant elderly female no distress sitting comfortably in her chair.  Her skin is warm and dry.  Eyes she does have left eye blindness visual acuity  right eye appears relatively baseline.  Oropharynx clear mucous membranes moist.  Chest is clear to auscultation there is no labored breathing.  Heart is regular rate and rhythm without murmur gallop or rub she has baseline lower extremity edema this appears to have stabilized with the low-dose Lasix.  It is cool to touch nonerythematous nontender.  Abdomen is soft nontender with positive bowel sounds.  Musculoskeletal does have lower extremity weakness is able to move all extremities x4  Neurologic as noted above her speech is clear she does have some left-sided weakness.  Psych she is largely alert and oriented apparently this has improved significantly since her admission to hospital-she is pleasant and appropriate.   Labs.  Dec 11, 2019.  WBC 3.4 hemoglobin 9.2 platelets 224.  Sodium 142 potassium 4.1 BUN 13.1 creatinine 0.69   Recent Labs (within last 365 days)             Recent Labs    11/24/19  7517 11/24/19 0504 11/25/19 0447 11/25/19 0447 11/26/19 0436 11/27/19 0826 11/27/19 1633 11/28/19 0427 11/29/19 0816  NA 139   < > 136   < > 138   < > 138 138 140  K 5.3*   < > 4.5   < > 4.6   < > 4.2 4.5 5.4*  CL 106   < > 107   < > 108   < > 108 110 109  CO2 25   < > 23   < > 24   < > 23 24 26   GLUCOSE 152*   < > 156*   < > 177*   < > 151* 113* 86  BUN 45*   < > 44*   < > 46*   < > 43* 48* 41*  CREATININE 1.28*   < > 1.12*   < > 1.19*   < > 1.17* 1.31* 1.14*  CALCIUM 8.5*   < > 7.8*   < > 8.1*   < > 8.1* 8.0* 8.3*  MG 2.5*  --  2.4  --  2.6*  --   --   --   --   PHOS 2.7  --  2.8  --  3.2  --   --   --   --    < > = values in this interval not displayed.     Recent Labs (within last 365 days)       Recent Labs    11/24/19 0504 11/25/19 0447 11/26/19 0436  AST 74* 40 25  ALT 70* 56* 49*  ALKPHOS 76 72 75  BILITOT 0.5 0.1* 0.2*  PROT 6.3* 5.3* 5.4*  ALBUMIN 2.2* 1.8* 1.9*     Recent Labs (within last 365 days)         Recent Labs     11/24/19 0504 11/24/19 0504 11/25/19 0447 11/26/19 0436 11/28/19 0427  WBC 7.1   < > 6.0 5.6 7.9  NEUTROABS 6.1  --  5.2 4.6  --   HGB 10.1*   < > 8.8* 9.4* 9.6*  HCT 32.9*   < > 29.1* 30.7* 31.4*  MCV 83.7   < > 84.6 83.7 85.3  PLT 257   < > 280 314 391   < > = values in this interval not displayed.     Recent Labs    Assessment and plan.  #1 Acute encephalopathy-amyloid angiopathy-she was treated in the hospital with steroids-neurology opted not to treat longer secondary to her age-mental status has improved significantly.  She has seen neurology and they recommended follow-up MRI apparently in early June-Ithink there may be an inflammatory process--- currently she appears stable she will need nursing support however --aswell as PT and OT for continued weakness  #2-history of acute on chronic kidney disease stage III-this improved with IV fluids in the hospital-renal ultrasound showed small kidneys with signs of atrophy-creatinine has been stable-her spironolactone losartan and Lasix were held on discharge-secondary to some increased edema we have restarted Lasix 20 mg Monday Wednesday Friday-update metabolic panel is pending.  3.  History of edema-this appears stabilized on the low-dose Lasix as noted above-currently receiving Lasix 20 mg Monday Wednesday Friday.  4.  Hypertension this appears stable not really on any medication except the Lasix 3 days a week recent blood pressures 132/78-133/59.  5.  History of neuropathy at this point appears to be controlled o Neurontin 200 mg nightly.  6.  History of B12 deficiency she is on supplementation-since her  stay here was quite short will defer follow-up to primary care provider.  Again she will need continued PT and OT as well as nursing support-as well as expedient follow-up with primary care provider.--And will see neurology in early June  Clinically appears to be doing well her altered mental status appears to be largely  resolving.  Addendum.  We have received the updated metabolic panel-Dec 16, 2407.  It shows stability with a creatinine of 0.77 BUN of 14.5-potassium of 4.3 and sodium of 143.  [id number] note greater than 30 minutes spent on this discharge summary-greater than 50% of time spent coordinating and formulating a plan of care for numerous diagnoses

## 2019-12-16 ENCOUNTER — Encounter: Payer: Self-pay | Admitting: Internal Medicine

## 2019-12-16 ENCOUNTER — Other Ambulatory Visit: Payer: Self-pay | Admitting: Internal Medicine

## 2019-12-16 MED ORDER — GARLIC 1000 MG PO CAPS: 2000.0000 mg | ORAL_CAPSULE | Freq: Every day | ORAL | 0 refills | Status: DC

## 2019-12-16 MED ORDER — ACETAMINOPHEN 500 MG PO TABS: 500.0000 mg | ORAL_TABLET | Freq: Every day | ORAL | 0 refills | Status: DC

## 2019-12-16 MED ORDER — TETRAHYDROZOLINE HCL 0.05 % OP SOLN: 1.0000 [drp] | Freq: Every day | OPHTHALMIC | 0 refills | Status: DC | PRN

## 2019-12-16 MED ORDER — FUROSEMIDE 20 MG PO TABS: 20.0000 mg | ORAL_TABLET | ORAL | 0 refills | Status: DC

## 2019-12-16 MED ORDER — POLYETHYLENE GLYCOL 3350 17 G PO PACK: 17.0000 g | PACK | Freq: Two times a day (BID) | ORAL | 0 refills | Status: DC

## 2019-12-16 MED ORDER — ARTIFICIAL TEARS 1 % OP SOLN
1.0000 [drp] | Freq: Three times a day (TID) | OPHTHALMIC | 0 refills | Status: DC | PRN
Start: 2019-12-16 — End: 2020-05-13

## 2019-12-16 MED ORDER — ASPIRIN EC 81 MG PO TBEC: 81.0000 mg | DELAYED_RELEASE_TABLET | Freq: Every day | ORAL | 0 refills | Status: DC

## 2019-12-16 MED ORDER — GABAPENTIN 100 MG PO CAPS: ORAL_CAPSULE | ORAL | 0 refills | Status: DC

## 2019-12-16 MED ORDER — GABAPENTIN 100 MG PO CAPS: 200.0000 mg | ORAL_CAPSULE | Freq: Every day | ORAL | 0 refills | Status: DC

## 2019-12-16 MED ORDER — VITAMIN B-12 1000 MCG PO TABS: 1000.0000 ug | ORAL_TABLET | Freq: Every day | ORAL | 0 refills | Status: DC

## 2019-12-16 MED ORDER — CVS VITAMIN C 500 MG PO TABS: 500.0000 mg | ORAL_TABLET | Freq: Every day | ORAL | 0 refills | Status: DC

## 2019-12-16 MED ORDER — DOCUSATE SODIUM 50 MG PO CAPS: 50.0000 mg | ORAL_CAPSULE | Freq: Two times a day (BID) | ORAL | 0 refills | Status: DC

## 2019-12-18 DIAGNOSIS — F8089 Other developmental disorders of speech and language: Secondary | ICD-10-CM | POA: Diagnosis not present

## 2019-12-18 DIAGNOSIS — B351 Tinea unguium: Secondary | ICD-10-CM | POA: Diagnosis not present

## 2019-12-18 DIAGNOSIS — I10 Essential (primary) hypertension: Secondary | ICD-10-CM | POA: Diagnosis not present

## 2019-12-18 DIAGNOSIS — I872 Venous insufficiency (chronic) (peripheral): Secondary | ICD-10-CM | POA: Diagnosis not present

## 2019-12-18 DIAGNOSIS — L03119 Cellulitis of unspecified part of limb: Secondary | ICD-10-CM | POA: Diagnosis not present

## 2019-12-18 NOTE — Telephone Encounter (Signed)
Noted. The patient already has a pending EEG on 01/12/20 and follow up appointment on 03/16/20.

## 2019-12-18 NOTE — Telephone Encounter (Signed)
Granville Lewis PA called to inform pt has been discharged from [facility] [place] [facility] rehab as a FYI if any FU is needed for pt   Cb#[phone]

## 2019-12-19 ENCOUNTER — Other Ambulatory Visit: Payer: Self-pay | Admitting: Internal Medicine

## 2019-12-19 DIAGNOSIS — I10 Essential (primary) hypertension: Secondary | ICD-10-CM

## 2019-12-22 DIAGNOSIS — H4010X0 Unspecified open-angle glaucoma, stage unspecified: Secondary | ICD-10-CM | POA: Diagnosis not present

## 2019-12-22 DIAGNOSIS — M4722 Other spondylosis with radiculopathy, cervical region: Secondary | ICD-10-CM | POA: Diagnosis not present

## 2019-12-22 DIAGNOSIS — Z7982 Long term (current) use of aspirin: Secondary | ICD-10-CM | POA: Diagnosis not present

## 2019-12-22 DIAGNOSIS — I739 Peripheral vascular disease, unspecified: Secondary | ICD-10-CM | POA: Diagnosis not present

## 2019-12-22 DIAGNOSIS — I890 Lymphedema, not elsewhere classified: Secondary | ICD-10-CM | POA: Diagnosis not present

## 2019-12-22 DIAGNOSIS — I340 Nonrheumatic mitral (valve) insufficiency: Secondary | ICD-10-CM | POA: Diagnosis not present

## 2019-12-22 DIAGNOSIS — I700 Atherosclerosis of aorta: Secondary | ICD-10-CM | POA: Diagnosis not present

## 2019-12-22 DIAGNOSIS — M1990 Unspecified osteoarthritis, unspecified site: Secondary | ICD-10-CM | POA: Diagnosis not present

## 2019-12-22 DIAGNOSIS — I129 Hypertensive chronic kidney disease with stage 1 through stage 4 chronic kidney disease, or unspecified chronic kidney disease: Secondary | ICD-10-CM | POA: Diagnosis not present

## 2019-12-22 DIAGNOSIS — N183 Chronic kidney disease, stage 3 unspecified: Secondary | ICD-10-CM | POA: Diagnosis not present

## 2019-12-22 DIAGNOSIS — G9341 Metabolic encephalopathy: Secondary | ICD-10-CM | POA: Diagnosis not present

## 2019-12-24 ENCOUNTER — Ambulatory Visit (INDEPENDENT_AMBULATORY_CARE_PROVIDER_SITE_OTHER): Payer: Medicare Other | Admitting: Primary Care

## 2019-12-26 DIAGNOSIS — G9341 Metabolic encephalopathy: Secondary | ICD-10-CM | POA: Diagnosis not present

## 2019-12-26 DIAGNOSIS — I129 Hypertensive chronic kidney disease with stage 1 through stage 4 chronic kidney disease, or unspecified chronic kidney disease: Secondary | ICD-10-CM | POA: Diagnosis not present

## 2019-12-26 DIAGNOSIS — Z7982 Long term (current) use of aspirin: Secondary | ICD-10-CM | POA: Diagnosis not present

## 2019-12-26 DIAGNOSIS — I739 Peripheral vascular disease, unspecified: Secondary | ICD-10-CM | POA: Diagnosis not present

## 2019-12-26 DIAGNOSIS — M1990 Unspecified osteoarthritis, unspecified site: Secondary | ICD-10-CM | POA: Diagnosis not present

## 2019-12-26 DIAGNOSIS — R079 Chest pain, unspecified: Secondary | ICD-10-CM | POA: Diagnosis not present

## 2019-12-26 DIAGNOSIS — N183 Chronic kidney disease, stage 3 unspecified: Secondary | ICD-10-CM | POA: Diagnosis not present

## 2019-12-26 DIAGNOSIS — M4722 Other spondylosis with radiculopathy, cervical region: Secondary | ICD-10-CM | POA: Diagnosis not present

## 2019-12-26 DIAGNOSIS — I700 Atherosclerosis of aorta: Secondary | ICD-10-CM | POA: Diagnosis not present

## 2019-12-26 DIAGNOSIS — H4010X0 Unspecified open-angle glaucoma, stage unspecified: Secondary | ICD-10-CM | POA: Diagnosis not present

## 2019-12-26 DIAGNOSIS — I890 Lymphedema, not elsewhere classified: Secondary | ICD-10-CM | POA: Diagnosis not present

## 2019-12-26 DIAGNOSIS — I340 Nonrheumatic mitral (valve) insufficiency: Secondary | ICD-10-CM | POA: Diagnosis not present

## 2019-12-30 DIAGNOSIS — Z7982 Long term (current) use of aspirin: Secondary | ICD-10-CM | POA: Diagnosis not present

## 2019-12-30 DIAGNOSIS — M1990 Unspecified osteoarthritis, unspecified site: Secondary | ICD-10-CM | POA: Diagnosis not present

## 2019-12-30 DIAGNOSIS — I890 Lymphedema, not elsewhere classified: Secondary | ICD-10-CM | POA: Diagnosis not present

## 2019-12-30 DIAGNOSIS — I129 Hypertensive chronic kidney disease with stage 1 through stage 4 chronic kidney disease, or unspecified chronic kidney disease: Secondary | ICD-10-CM | POA: Diagnosis not present

## 2019-12-30 DIAGNOSIS — M4722 Other spondylosis with radiculopathy, cervical region: Secondary | ICD-10-CM | POA: Diagnosis not present

## 2019-12-30 DIAGNOSIS — H4010X0 Unspecified open-angle glaucoma, stage unspecified: Secondary | ICD-10-CM | POA: Diagnosis not present

## 2019-12-30 DIAGNOSIS — G9341 Metabolic encephalopathy: Secondary | ICD-10-CM | POA: Diagnosis not present

## 2019-12-30 DIAGNOSIS — I10 Essential (primary) hypertension: Secondary | ICD-10-CM | POA: Diagnosis not present

## 2019-12-30 DIAGNOSIS — I340 Nonrheumatic mitral (valve) insufficiency: Secondary | ICD-10-CM | POA: Diagnosis not present

## 2019-12-30 DIAGNOSIS — I739 Peripheral vascular disease, unspecified: Secondary | ICD-10-CM | POA: Diagnosis not present

## 2019-12-30 DIAGNOSIS — I700 Atherosclerosis of aorta: Secondary | ICD-10-CM | POA: Diagnosis not present

## 2019-12-30 DIAGNOSIS — N183 Chronic kidney disease, stage 3 unspecified: Secondary | ICD-10-CM | POA: Diagnosis not present

## 2020-01-02 DIAGNOSIS — I700 Atherosclerosis of aorta: Secondary | ICD-10-CM | POA: Diagnosis not present

## 2020-01-02 DIAGNOSIS — H4010X0 Unspecified open-angle glaucoma, stage unspecified: Secondary | ICD-10-CM | POA: Diagnosis not present

## 2020-01-02 DIAGNOSIS — I340 Nonrheumatic mitral (valve) insufficiency: Secondary | ICD-10-CM | POA: Diagnosis not present

## 2020-01-02 DIAGNOSIS — G9341 Metabolic encephalopathy: Secondary | ICD-10-CM | POA: Diagnosis not present

## 2020-01-02 DIAGNOSIS — M4722 Other spondylosis with radiculopathy, cervical region: Secondary | ICD-10-CM | POA: Diagnosis not present

## 2020-01-02 DIAGNOSIS — I890 Lymphedema, not elsewhere classified: Secondary | ICD-10-CM | POA: Diagnosis not present

## 2020-01-02 DIAGNOSIS — M1990 Unspecified osteoarthritis, unspecified site: Secondary | ICD-10-CM | POA: Diagnosis not present

## 2020-01-02 DIAGNOSIS — I739 Peripheral vascular disease, unspecified: Secondary | ICD-10-CM | POA: Diagnosis not present

## 2020-01-02 DIAGNOSIS — N183 Chronic kidney disease, stage 3 unspecified: Secondary | ICD-10-CM | POA: Diagnosis not present

## 2020-01-02 DIAGNOSIS — Z7982 Long term (current) use of aspirin: Secondary | ICD-10-CM | POA: Diagnosis not present

## 2020-01-02 DIAGNOSIS — I129 Hypertensive chronic kidney disease with stage 1 through stage 4 chronic kidney disease, or unspecified chronic kidney disease: Secondary | ICD-10-CM | POA: Diagnosis not present

## 2020-01-05 DIAGNOSIS — I739 Peripheral vascular disease, unspecified: Secondary | ICD-10-CM | POA: Diagnosis not present

## 2020-01-05 DIAGNOSIS — I700 Atherosclerosis of aorta: Secondary | ICD-10-CM | POA: Diagnosis not present

## 2020-01-05 DIAGNOSIS — I129 Hypertensive chronic kidney disease with stage 1 through stage 4 chronic kidney disease, or unspecified chronic kidney disease: Secondary | ICD-10-CM | POA: Diagnosis not present

## 2020-01-05 DIAGNOSIS — I890 Lymphedema, not elsewhere classified: Secondary | ICD-10-CM | POA: Diagnosis not present

## 2020-01-05 DIAGNOSIS — H4010X0 Unspecified open-angle glaucoma, stage unspecified: Secondary | ICD-10-CM | POA: Diagnosis not present

## 2020-01-05 DIAGNOSIS — M4722 Other spondylosis with radiculopathy, cervical region: Secondary | ICD-10-CM | POA: Diagnosis not present

## 2020-01-05 DIAGNOSIS — N183 Chronic kidney disease, stage 3 unspecified: Secondary | ICD-10-CM | POA: Diagnosis not present

## 2020-01-05 DIAGNOSIS — M1990 Unspecified osteoarthritis, unspecified site: Secondary | ICD-10-CM | POA: Diagnosis not present

## 2020-01-05 DIAGNOSIS — I340 Nonrheumatic mitral (valve) insufficiency: Secondary | ICD-10-CM | POA: Diagnosis not present

## 2020-01-05 DIAGNOSIS — G9341 Metabolic encephalopathy: Secondary | ICD-10-CM | POA: Diagnosis not present

## 2020-01-05 DIAGNOSIS — Z7982 Long term (current) use of aspirin: Secondary | ICD-10-CM | POA: Diagnosis not present

## 2020-01-06 DIAGNOSIS — I890 Lymphedema, not elsewhere classified: Secondary | ICD-10-CM | POA: Diagnosis not present

## 2020-01-06 DIAGNOSIS — G9341 Metabolic encephalopathy: Secondary | ICD-10-CM | POA: Diagnosis not present

## 2020-01-06 DIAGNOSIS — M1990 Unspecified osteoarthritis, unspecified site: Secondary | ICD-10-CM | POA: Diagnosis not present

## 2020-01-06 DIAGNOSIS — I700 Atherosclerosis of aorta: Secondary | ICD-10-CM | POA: Diagnosis not present

## 2020-01-06 DIAGNOSIS — I129 Hypertensive chronic kidney disease with stage 1 through stage 4 chronic kidney disease, or unspecified chronic kidney disease: Secondary | ICD-10-CM | POA: Diagnosis not present

## 2020-01-06 DIAGNOSIS — I739 Peripheral vascular disease, unspecified: Secondary | ICD-10-CM | POA: Diagnosis not present

## 2020-01-06 DIAGNOSIS — M4722 Other spondylosis with radiculopathy, cervical region: Secondary | ICD-10-CM | POA: Diagnosis not present

## 2020-01-06 DIAGNOSIS — Z7982 Long term (current) use of aspirin: Secondary | ICD-10-CM | POA: Diagnosis not present

## 2020-01-06 DIAGNOSIS — H4010X0 Unspecified open-angle glaucoma, stage unspecified: Secondary | ICD-10-CM | POA: Diagnosis not present

## 2020-01-06 DIAGNOSIS — N183 Chronic kidney disease, stage 3 unspecified: Secondary | ICD-10-CM | POA: Diagnosis not present

## 2020-01-06 DIAGNOSIS — I340 Nonrheumatic mitral (valve) insufficiency: Secondary | ICD-10-CM | POA: Diagnosis not present

## 2020-01-07 DIAGNOSIS — R600 Localized edema: Secondary | ICD-10-CM | POA: Diagnosis not present

## 2020-01-07 DIAGNOSIS — D6489 Other specified anemias: Secondary | ICD-10-CM | POA: Diagnosis not present

## 2020-01-07 DIAGNOSIS — I10 Essential (primary) hypertension: Secondary | ICD-10-CM | POA: Diagnosis not present

## 2020-01-07 DIAGNOSIS — M1990 Unspecified osteoarthritis, unspecified site: Secondary | ICD-10-CM | POA: Diagnosis not present

## 2020-01-07 DIAGNOSIS — L97509 Non-pressure chronic ulcer of other part of unspecified foot with unspecified severity: Secondary | ICD-10-CM | POA: Diagnosis not present

## 2020-01-08 DIAGNOSIS — Z7982 Long term (current) use of aspirin: Secondary | ICD-10-CM | POA: Diagnosis not present

## 2020-01-08 DIAGNOSIS — I340 Nonrheumatic mitral (valve) insufficiency: Secondary | ICD-10-CM | POA: Diagnosis not present

## 2020-01-08 DIAGNOSIS — H4010X0 Unspecified open-angle glaucoma, stage unspecified: Secondary | ICD-10-CM | POA: Diagnosis not present

## 2020-01-08 DIAGNOSIS — I700 Atherosclerosis of aorta: Secondary | ICD-10-CM | POA: Diagnosis not present

## 2020-01-08 DIAGNOSIS — M1990 Unspecified osteoarthritis, unspecified site: Secondary | ICD-10-CM | POA: Diagnosis not present

## 2020-01-08 DIAGNOSIS — G9341 Metabolic encephalopathy: Secondary | ICD-10-CM | POA: Diagnosis not present

## 2020-01-08 DIAGNOSIS — I739 Peripheral vascular disease, unspecified: Secondary | ICD-10-CM | POA: Diagnosis not present

## 2020-01-08 DIAGNOSIS — I129 Hypertensive chronic kidney disease with stage 1 through stage 4 chronic kidney disease, or unspecified chronic kidney disease: Secondary | ICD-10-CM | POA: Diagnosis not present

## 2020-01-08 DIAGNOSIS — I890 Lymphedema, not elsewhere classified: Secondary | ICD-10-CM | POA: Diagnosis not present

## 2020-01-08 DIAGNOSIS — M4722 Other spondylosis with radiculopathy, cervical region: Secondary | ICD-10-CM | POA: Diagnosis not present

## 2020-01-08 DIAGNOSIS — N183 Chronic kidney disease, stage 3 unspecified: Secondary | ICD-10-CM | POA: Diagnosis not present

## 2020-01-09 ENCOUNTER — Other Ambulatory Visit: Payer: Self-pay | Admitting: Internal Medicine

## 2020-01-09 DIAGNOSIS — G9341 Metabolic encephalopathy: Secondary | ICD-10-CM | POA: Diagnosis not present

## 2020-01-09 DIAGNOSIS — N183 Chronic kidney disease, stage 3 unspecified: Secondary | ICD-10-CM | POA: Diagnosis not present

## 2020-01-09 DIAGNOSIS — I129 Hypertensive chronic kidney disease with stage 1 through stage 4 chronic kidney disease, or unspecified chronic kidney disease: Secondary | ICD-10-CM | POA: Diagnosis not present

## 2020-01-09 DIAGNOSIS — I739 Peripheral vascular disease, unspecified: Secondary | ICD-10-CM | POA: Diagnosis not present

## 2020-01-09 DIAGNOSIS — I700 Atherosclerosis of aorta: Secondary | ICD-10-CM | POA: Diagnosis not present

## 2020-01-09 DIAGNOSIS — M4722 Other spondylosis with radiculopathy, cervical region: Secondary | ICD-10-CM | POA: Diagnosis not present

## 2020-01-09 DIAGNOSIS — Z7982 Long term (current) use of aspirin: Secondary | ICD-10-CM | POA: Diagnosis not present

## 2020-01-09 DIAGNOSIS — I340 Nonrheumatic mitral (valve) insufficiency: Secondary | ICD-10-CM | POA: Diagnosis not present

## 2020-01-09 DIAGNOSIS — I890 Lymphedema, not elsewhere classified: Secondary | ICD-10-CM | POA: Diagnosis not present

## 2020-01-09 DIAGNOSIS — M1990 Unspecified osteoarthritis, unspecified site: Secondary | ICD-10-CM | POA: Diagnosis not present

## 2020-01-09 DIAGNOSIS — H4010X0 Unspecified open-angle glaucoma, stage unspecified: Secondary | ICD-10-CM | POA: Diagnosis not present

## 2020-01-12 ENCOUNTER — Other Ambulatory Visit: Payer: Self-pay

## 2020-01-12 ENCOUNTER — Ambulatory Visit: Payer: Medicare Other

## 2020-01-12 DIAGNOSIS — I890 Lymphedema, not elsewhere classified: Secondary | ICD-10-CM | POA: Diagnosis not present

## 2020-01-12 DIAGNOSIS — H4010X0 Unspecified open-angle glaucoma, stage unspecified: Secondary | ICD-10-CM | POA: Diagnosis not present

## 2020-01-12 DIAGNOSIS — I129 Hypertensive chronic kidney disease with stage 1 through stage 4 chronic kidney disease, or unspecified chronic kidney disease: Secondary | ICD-10-CM | POA: Diagnosis not present

## 2020-01-12 DIAGNOSIS — G9341 Metabolic encephalopathy: Secondary | ICD-10-CM | POA: Diagnosis not present

## 2020-01-12 DIAGNOSIS — N183 Chronic kidney disease, stage 3 unspecified: Secondary | ICD-10-CM | POA: Diagnosis not present

## 2020-01-12 DIAGNOSIS — I739 Peripheral vascular disease, unspecified: Secondary | ICD-10-CM | POA: Diagnosis not present

## 2020-01-12 DIAGNOSIS — I340 Nonrheumatic mitral (valve) insufficiency: Secondary | ICD-10-CM | POA: Diagnosis not present

## 2020-01-12 DIAGNOSIS — M1990 Unspecified osteoarthritis, unspecified site: Secondary | ICD-10-CM | POA: Diagnosis not present

## 2020-01-12 DIAGNOSIS — I700 Atherosclerosis of aorta: Secondary | ICD-10-CM | POA: Diagnosis not present

## 2020-01-12 DIAGNOSIS — Z7982 Long term (current) use of aspirin: Secondary | ICD-10-CM | POA: Diagnosis not present

## 2020-01-12 DIAGNOSIS — M4722 Other spondylosis with radiculopathy, cervical region: Secondary | ICD-10-CM | POA: Diagnosis not present

## 2020-01-13 DIAGNOSIS — I700 Atherosclerosis of aorta: Secondary | ICD-10-CM | POA: Diagnosis not present

## 2020-01-13 DIAGNOSIS — I340 Nonrheumatic mitral (valve) insufficiency: Secondary | ICD-10-CM | POA: Diagnosis not present

## 2020-01-13 DIAGNOSIS — I739 Peripheral vascular disease, unspecified: Secondary | ICD-10-CM | POA: Diagnosis not present

## 2020-01-13 DIAGNOSIS — I890 Lymphedema, not elsewhere classified: Secondary | ICD-10-CM | POA: Diagnosis not present

## 2020-01-13 DIAGNOSIS — G9341 Metabolic encephalopathy: Secondary | ICD-10-CM | POA: Diagnosis not present

## 2020-01-13 DIAGNOSIS — I129 Hypertensive chronic kidney disease with stage 1 through stage 4 chronic kidney disease, or unspecified chronic kidney disease: Secondary | ICD-10-CM | POA: Diagnosis not present

## 2020-01-13 DIAGNOSIS — M1990 Unspecified osteoarthritis, unspecified site: Secondary | ICD-10-CM | POA: Diagnosis not present

## 2020-01-13 DIAGNOSIS — N183 Chronic kidney disease, stage 3 unspecified: Secondary | ICD-10-CM | POA: Diagnosis not present

## 2020-01-13 DIAGNOSIS — M4722 Other spondylosis with radiculopathy, cervical region: Secondary | ICD-10-CM | POA: Diagnosis not present

## 2020-01-13 DIAGNOSIS — Z7982 Long term (current) use of aspirin: Secondary | ICD-10-CM | POA: Diagnosis not present

## 2020-01-13 DIAGNOSIS — H4010X0 Unspecified open-angle glaucoma, stage unspecified: Secondary | ICD-10-CM | POA: Diagnosis not present

## 2020-01-14 ENCOUNTER — Other Ambulatory Visit: Payer: Medicare Other

## 2020-01-14 ENCOUNTER — Ambulatory Visit: Payer: Medicare Other | Admitting: Podiatrist

## 2020-01-14 ENCOUNTER — Other Ambulatory Visit: Payer: Self-pay

## 2020-01-14 VITALS — Temp 96.3°F

## 2020-01-14 DIAGNOSIS — M79609 Pain in unspecified limb: Secondary | ICD-10-CM

## 2020-01-14 DIAGNOSIS — M79676 Pain in unspecified toe(s): Secondary | ICD-10-CM

## 2020-01-14 DIAGNOSIS — R609 Edema, unspecified: Secondary | ICD-10-CM

## 2020-01-14 DIAGNOSIS — B351 Tinea unguium: Secondary | ICD-10-CM | POA: Diagnosis not present

## 2020-01-14 NOTE — Progress Notes (Signed)
Boil, swelling, nails  Chief Complaint  Patient presents with  . Nail Problem    Thick, long toenails     HPI: Patient is 86 y.o. female who presents today for the concerns as listed above.  Patient also relates she has a boil on the right dorsum of her foot of which she is using saline to cleanse and a triple antibiotic ointment with a bandage.  She also relates she is prediabetic   Review of Systems No fevers, chills, nausea, muscle aches, no difficulty breathing, no calf pain, no chest pain or shortness of breath.   Physical Exam  GENERAL APPEARANCE: Alert, conversant. Appropriately groomed. No acute distress.   VASCULAR: Pedal pulses palpable DP and PT bilateral.  Capillary refill time is immediate to all digits,  Proximal to distal cooling it warm to warm.  Edema noted bilateral  NEUROLOGIC: sensation is intact epicritically and protectively to 5.07 monofilament at 5/5 sites bilateral.  Light touch is intact bilateral, vibratory sensation intact bilateral, achilles tendon reflex is intact bilateral.   MUSCULOSKELETAL: acceptable muscle strength, tone and stability bilateral.  No gross boney pedal deformities noted.  No pain, crepitus or limitation noted with foot and ankle range of motion bilateral.   DERMATOLOGIC: skin is warm, supple, and dry.  No open lesions noted.  No rash, no pre ulcerative lesions. Digital nails are thick, discolored, dystrophic, painful with palpation and debridement.  Dorsum of the right foot has a resolving boil with a base that appears healthy and healing.  No sign of infection noted.    Assessment   Pain due to onychomycosis of nail  Edema, unspecified type    Plan  Debridement of toenails was recommended.  Onychoreduction of symptomatic toenails was performed via nail nipper and power burr without iatrogenic incident.  I applied antibiotic ointment and a dressing to the right foot.  Patient was instructed on signs and symptoms of infection and  was told to call immediately should any of these arise.

## 2020-01-15 DIAGNOSIS — H4010X0 Unspecified open-angle glaucoma, stage unspecified: Secondary | ICD-10-CM | POA: Diagnosis not present

## 2020-01-15 DIAGNOSIS — I700 Atherosclerosis of aorta: Secondary | ICD-10-CM | POA: Diagnosis not present

## 2020-01-15 DIAGNOSIS — G9341 Metabolic encephalopathy: Secondary | ICD-10-CM | POA: Diagnosis not present

## 2020-01-15 DIAGNOSIS — N183 Chronic kidney disease, stage 3 unspecified: Secondary | ICD-10-CM | POA: Diagnosis not present

## 2020-01-15 DIAGNOSIS — M4722 Other spondylosis with radiculopathy, cervical region: Secondary | ICD-10-CM | POA: Diagnosis not present

## 2020-01-15 DIAGNOSIS — M1990 Unspecified osteoarthritis, unspecified site: Secondary | ICD-10-CM | POA: Diagnosis not present

## 2020-01-15 DIAGNOSIS — I739 Peripheral vascular disease, unspecified: Secondary | ICD-10-CM | POA: Diagnosis not present

## 2020-01-15 DIAGNOSIS — I129 Hypertensive chronic kidney disease with stage 1 through stage 4 chronic kidney disease, or unspecified chronic kidney disease: Secondary | ICD-10-CM | POA: Diagnosis not present

## 2020-01-15 DIAGNOSIS — I340 Nonrheumatic mitral (valve) insufficiency: Secondary | ICD-10-CM | POA: Diagnosis not present

## 2020-01-15 DIAGNOSIS — Z7982 Long term (current) use of aspirin: Secondary | ICD-10-CM | POA: Diagnosis not present

## 2020-01-15 DIAGNOSIS — I890 Lymphedema, not elsewhere classified: Secondary | ICD-10-CM | POA: Diagnosis not present

## 2020-01-19 DIAGNOSIS — I700 Atherosclerosis of aorta: Secondary | ICD-10-CM | POA: Diagnosis not present

## 2020-01-19 DIAGNOSIS — H4010X0 Unspecified open-angle glaucoma, stage unspecified: Secondary | ICD-10-CM | POA: Diagnosis not present

## 2020-01-19 DIAGNOSIS — I890 Lymphedema, not elsewhere classified: Secondary | ICD-10-CM | POA: Diagnosis not present

## 2020-01-19 DIAGNOSIS — Z7982 Long term (current) use of aspirin: Secondary | ICD-10-CM | POA: Diagnosis not present

## 2020-01-19 DIAGNOSIS — I129 Hypertensive chronic kidney disease with stage 1 through stage 4 chronic kidney disease, or unspecified chronic kidney disease: Secondary | ICD-10-CM | POA: Diagnosis not present

## 2020-01-19 DIAGNOSIS — N183 Chronic kidney disease, stage 3 unspecified: Secondary | ICD-10-CM | POA: Diagnosis not present

## 2020-01-19 DIAGNOSIS — G9341 Metabolic encephalopathy: Secondary | ICD-10-CM | POA: Diagnosis not present

## 2020-01-19 DIAGNOSIS — I739 Peripheral vascular disease, unspecified: Secondary | ICD-10-CM | POA: Diagnosis not present

## 2020-01-19 DIAGNOSIS — M1990 Unspecified osteoarthritis, unspecified site: Secondary | ICD-10-CM | POA: Diagnosis not present

## 2020-01-19 DIAGNOSIS — I340 Nonrheumatic mitral (valve) insufficiency: Secondary | ICD-10-CM | POA: Diagnosis not present

## 2020-01-19 DIAGNOSIS — M4722 Other spondylosis with radiculopathy, cervical region: Secondary | ICD-10-CM | POA: Diagnosis not present

## 2020-01-20 ENCOUNTER — Emergency Department (HOSPITAL_COMMUNITY): Payer: Medicare Other

## 2020-01-20 ENCOUNTER — Inpatient Hospital Stay (HOSPITAL_COMMUNITY): Payer: Medicare Other

## 2020-01-20 ENCOUNTER — Other Ambulatory Visit: Payer: Self-pay

## 2020-01-20 ENCOUNTER — Encounter: Payer: Self-pay | Admitting: Podiatrist

## 2020-01-20 ENCOUNTER — Inpatient Hospital Stay (HOSPITAL_COMMUNITY)
Admission: EM | Admit: 2020-01-20 | Discharge: 2020-01-24 | DRG: 545 | Disposition: A | Discharge status: Home Health | Payer: Medicare Other | Attending: Family Medicine | Admitting: Family Medicine

## 2020-01-20 ENCOUNTER — Encounter (HOSPITAL_COMMUNITY): Payer: Self-pay

## 2020-01-20 DIAGNOSIS — I10 Essential (primary) hypertension: Secondary | ICD-10-CM | POA: Diagnosis not present

## 2020-01-20 DIAGNOSIS — H401190 Primary open-angle glaucoma, unspecified eye, stage unspecified: Secondary | ICD-10-CM | POA: Diagnosis not present

## 2020-01-20 DIAGNOSIS — I890 Lymphedema, not elsewhere classified: Secondary | ICD-10-CM | POA: Diagnosis present

## 2020-01-20 DIAGNOSIS — E876 Hypokalemia: Secondary | ICD-10-CM | POA: Diagnosis present

## 2020-01-20 DIAGNOSIS — G9341 Metabolic encephalopathy: Secondary | ICD-10-CM | POA: Diagnosis present

## 2020-01-20 DIAGNOSIS — H9190 Unspecified hearing loss, unspecified ear: Secondary | ICD-10-CM | POA: Diagnosis present

## 2020-01-20 DIAGNOSIS — R440 Auditory hallucinations: Secondary | ICD-10-CM | POA: Diagnosis present

## 2020-01-20 DIAGNOSIS — R404 Transient alteration of awareness: Secondary | ICD-10-CM | POA: Diagnosis not present

## 2020-01-20 DIAGNOSIS — R413 Other amnesia: Secondary | ICD-10-CM | POA: Diagnosis present

## 2020-01-20 DIAGNOSIS — I609 Nontraumatic subarachnoid hemorrhage, unspecified: Secondary | ICD-10-CM | POA: Diagnosis not present

## 2020-01-20 DIAGNOSIS — Z9841 Cataract extraction status, right eye: Secondary | ICD-10-CM

## 2020-01-20 DIAGNOSIS — D6489 Other specified anemias: Secondary | ICD-10-CM | POA: Diagnosis not present

## 2020-01-20 DIAGNOSIS — R441 Visual hallucinations: Secondary | ICD-10-CM | POA: Diagnosis present

## 2020-01-20 DIAGNOSIS — L03115 Cellulitis of right lower limb: Secondary | ICD-10-CM | POA: Diagnosis not present

## 2020-01-20 DIAGNOSIS — I6200 Nontraumatic subdural hemorrhage, unspecified: Secondary | ICD-10-CM | POA: Diagnosis not present

## 2020-01-20 DIAGNOSIS — N1831 Chronic kidney disease, stage 3a: Secondary | ICD-10-CM | POA: Diagnosis not present

## 2020-01-20 DIAGNOSIS — Z9842 Cataract extraction status, left eye: Secondary | ICD-10-CM

## 2020-01-20 DIAGNOSIS — R400 Somnolence: Secondary | ICD-10-CM

## 2020-01-20 DIAGNOSIS — F0390 Unspecified dementia without behavioral disturbance: Secondary | ICD-10-CM | POA: Diagnosis present

## 2020-01-20 DIAGNOSIS — K5900 Constipation, unspecified: Secondary | ICD-10-CM

## 2020-01-20 DIAGNOSIS — Z8679 Personal history of other diseases of the circulatory system: Secondary | ICD-10-CM | POA: Diagnosis not present

## 2020-01-20 DIAGNOSIS — Z800 Family history of malignant neoplasm of digestive organs: Secondary | ICD-10-CM | POA: Diagnosis not present

## 2020-01-20 DIAGNOSIS — I739 Peripheral vascular disease, unspecified: Secondary | ICD-10-CM | POA: Diagnosis present

## 2020-01-20 DIAGNOSIS — Z20822 Contact with and (suspected) exposure to covid-19: Secondary | ICD-10-CM | POA: Diagnosis present

## 2020-01-20 DIAGNOSIS — H5340 Unspecified visual field defects: Secondary | ICD-10-CM | POA: Diagnosis present

## 2020-01-20 DIAGNOSIS — I4891 Unspecified atrial fibrillation: Secondary | ICD-10-CM | POA: Diagnosis not present

## 2020-01-20 DIAGNOSIS — R202 Paresthesia of skin: Secondary | ICD-10-CM

## 2020-01-20 DIAGNOSIS — Z7982 Long term (current) use of aspirin: Secondary | ICD-10-CM

## 2020-01-20 DIAGNOSIS — Z03818 Encounter for observation for suspected exposure to other biological agents ruled out: Secondary | ICD-10-CM | POA: Diagnosis not present

## 2020-01-20 DIAGNOSIS — M179 Osteoarthritis of knee, unspecified: Secondary | ICD-10-CM | POA: Diagnosis not present

## 2020-01-20 DIAGNOSIS — N183 Chronic kidney disease, stage 3 unspecified: Secondary | ICD-10-CM | POA: Diagnosis present

## 2020-01-20 DIAGNOSIS — R410 Disorientation, unspecified: Secondary | ICD-10-CM | POA: Diagnosis not present

## 2020-01-20 DIAGNOSIS — E854 Organ-limited amyloidosis: Secondary | ICD-10-CM | POA: Diagnosis present

## 2020-01-20 DIAGNOSIS — R6889 Other general symptoms and signs: Secondary | ICD-10-CM | POA: Diagnosis not present

## 2020-01-20 DIAGNOSIS — Z79899 Other long term (current) drug therapy: Secondary | ICD-10-CM

## 2020-01-20 DIAGNOSIS — I700 Atherosclerosis of aorta: Secondary | ICD-10-CM | POA: Diagnosis not present

## 2020-01-20 DIAGNOSIS — I129 Hypertensive chronic kidney disease with stage 1 through stage 4 chronic kidney disease, or unspecified chronic kidney disease: Secondary | ICD-10-CM | POA: Diagnosis present

## 2020-01-20 DIAGNOSIS — Z743 Need for continuous supervision: Secondary | ICD-10-CM | POA: Diagnosis not present

## 2020-01-20 DIAGNOSIS — M1990 Unspecified osteoarthritis, unspecified site: Secondary | ICD-10-CM | POA: Diagnosis not present

## 2020-01-20 DIAGNOSIS — R4182 Altered mental status, unspecified: Secondary | ICD-10-CM | POA: Diagnosis present

## 2020-01-20 DIAGNOSIS — I680 Cerebral amyloid angiopathy: Secondary | ICD-10-CM | POA: Diagnosis not present

## 2020-01-20 DIAGNOSIS — R600 Localized edema: Secondary | ICD-10-CM | POA: Diagnosis not present

## 2020-01-20 DIAGNOSIS — G9340 Encephalopathy, unspecified: Secondary | ICD-10-CM | POA: Diagnosis present

## 2020-01-20 DIAGNOSIS — I872 Venous insufficiency (chronic) (peripheral): Secondary | ICD-10-CM | POA: Diagnosis not present

## 2020-01-20 DIAGNOSIS — M1711 Unilateral primary osteoarthritis, right knee: Secondary | ICD-10-CM | POA: Diagnosis not present

## 2020-01-20 DIAGNOSIS — Z90710 Acquired absence of both cervix and uterus: Secondary | ICD-10-CM | POA: Diagnosis not present

## 2020-01-20 DIAGNOSIS — R5383 Other fatigue: Secondary | ICD-10-CM | POA: Diagnosis not present

## 2020-01-20 DIAGNOSIS — L97509 Non-pressure chronic ulcer of other part of unspecified foot with unspecified severity: Secondary | ICD-10-CM | POA: Diagnosis not present

## 2020-01-20 LAB — URINALYSIS, ROUTINE W REFLEX MICROSCOPIC
Bilirubin Urine: NEGATIVE
Glucose, UA: NEGATIVE mg/dL
Ketones, ur: NEGATIVE mg/dL
Leukocytes,Ua: NEGATIVE
Nitrite: NEGATIVE
Protein, ur: NEGATIVE mg/dL
Specific Gravity, Urine: 1.012 (ref 1.005–1.030)
pH: 6 (ref 5.0–8.0)

## 2020-01-20 LAB — CBC WITH DIFFERENTIAL/PLATELET
Abs Immature Granulocytes: 0.02 10*3/uL (ref 0.00–0.07)
Basophils Absolute: 0 10*3/uL (ref 0.0–0.1)
Basophils Relative: 0 %
Eosinophils Absolute: 0 10*3/uL (ref 0.0–0.5)
Eosinophils Relative: 0 %
HCT: 34.2 % — ABNORMAL LOW (ref 36.0–46.0)
Hemoglobin: 10.5 g/dL — ABNORMAL LOW (ref 12.0–15.0)
Immature Granulocytes: 0 %
Lymphocytes Relative: 16 %
Lymphs Abs: 1.1 10*3/uL (ref 0.7–4.0)
MCH: 25.5 pg — ABNORMAL LOW (ref 26.0–34.0)
MCHC: 30.7 g/dL (ref 30.0–36.0)
MCV: 83.2 fL (ref 80.0–100.0)
Monocytes Absolute: 0.9 10*3/uL (ref 0.1–1.0)
Monocytes Relative: 14 %
Neutro Abs: 4.7 10*3/uL (ref 1.7–7.7)
Neutrophils Relative %: 70 %
Platelets: 260 10*3/uL (ref 150–400)
RBC: 4.11 MIL/uL (ref 3.87–5.11)
RDW: 18.3 % — ABNORMAL HIGH (ref 11.5–15.5)
WBC: 6.7 10*3/uL (ref 4.0–10.5)
nRBC: 0 % (ref 0.0–0.2)

## 2020-01-20 LAB — AMMONIA: Ammonia: 15 umol/L (ref 9–35)

## 2020-01-20 LAB — COMPREHENSIVE METABOLIC PANEL
ALT: 14 U/L (ref 0–44)
AST: 25 U/L (ref 15–41)
Albumin: 3.5 g/dL (ref 3.5–5.0)
Alkaline Phosphatase: 76 U/L (ref 38–126)
Anion gap: 12 (ref 5–15)
BUN: 18 mg/dL (ref 8–23)
CO2: 30 mmol/L (ref 22–32)
Calcium: 9.2 mg/dL (ref 8.9–10.3)
Chloride: 96 mmol/L — ABNORMAL LOW (ref 98–111)
Creatinine, Ser: 1.05 mg/dL — ABNORMAL HIGH (ref 0.44–1.00)
GFR calc Af Amer: 55 mL/min — ABNORMAL LOW (ref >60)
GFR calc non Af Amer: 48 mL/min — ABNORMAL LOW (ref >60)
Glucose, Bld: 141 mg/dL — ABNORMAL HIGH (ref 70–99)
Potassium: 3.4 mmol/L — ABNORMAL LOW (ref 3.5–5.1)
Sodium: 138 mmol/L (ref 135–145)
Total Bilirubin: 1.2 mg/dL (ref 0.3–1.2)
Total Protein: 7.4 g/dL (ref 6.5–8.1)

## 2020-01-20 LAB — TROPONIN I (HIGH SENSITIVITY)
Troponin I (High Sensitivity): 24 ng/L — ABNORMAL HIGH (ref <18)
Troponin I (High Sensitivity): 25 ng/L — ABNORMAL HIGH (ref <18)

## 2020-01-20 LAB — PROTIME-INR
INR: 1.1 (ref 0.8–1.2)
Prothrombin Time: 14 seconds (ref 11.4–15.2)

## 2020-01-20 LAB — LACTIC ACID, PLASMA
Lactic Acid, Venous: 1.3 mmol/L (ref 0.5–1.9)
Lactic Acid, Venous: 1.4 mmol/L (ref 0.5–1.9)

## 2020-01-20 LAB — RAPID URINE DRUG SCREEN, HOSP PERFORMED
Amphetamines: NOT DETECTED
Barbiturates: NOT DETECTED
Benzodiazepines: NOT DETECTED
Cocaine: NOT DETECTED
Opiates: NOT DETECTED
Tetrahydrocannabinol: NOT DETECTED

## 2020-01-20 LAB — APTT: aPTT: 34 seconds (ref 24–36)

## 2020-01-20 LAB — SARS CORONAVIRUS 2 BY RT PCR (HOSPITAL ORDER, PERFORMED IN ~~LOC~~ HOSPITAL LAB): SARS Coronavirus 2: NEGATIVE

## 2020-01-20 MED ORDER — LOSARTAN POTASSIUM 50 MG PO TABS
50.0000 mg | ORAL_TABLET | Freq: Every day | ORAL | Status: DC
  Administered 2020-01-21 – 2020-01-24 (×4): 50 mg via ORAL
  Filled 2020-01-20 (×4): qty 1

## 2020-01-20 MED ORDER — VITAMIN B-12 1000 MCG PO TABS
1000.0000 ug | ORAL_TABLET | Freq: Every day | ORAL | Status: DC
  Administered 2020-01-21 – 2020-01-24 (×4): 1000 ug via ORAL
  Filled 2020-01-20 (×4): qty 1

## 2020-01-20 MED ORDER — FUROSEMIDE 20 MG PO TABS
20.0000 mg | ORAL_TABLET | Freq: Every day | ORAL | Status: DC
  Administered 2020-01-21 – 2020-01-24 (×4): 20 mg via ORAL
  Filled 2020-01-20 (×4): qty 1

## 2020-01-20 MED ORDER — SPIRONOLACTONE 25 MG PO TABS
25.0000 mg | ORAL_TABLET | Freq: Every day | ORAL | Status: DC
  Administered 2020-01-21 – 2020-01-24 (×4): 25 mg via ORAL
  Filled 2020-01-20 (×4): qty 1

## 2020-01-20 MED ORDER — GABAPENTIN 100 MG PO CAPS
200.0000 mg | ORAL_CAPSULE | Freq: Every day | ORAL | Status: DC
  Administered 2020-01-20: 200 mg via ORAL
  Filled 2020-01-20: qty 2

## 2020-01-20 MED ORDER — GABAPENTIN 100 MG PO CAPS: 100.0000 mg | ORAL_CAPSULE | ORAL | Status: DC

## 2020-01-20 MED ORDER — ONDANSETRON HCL 4 MG/2ML IJ SOLN
4.0000 mg | Freq: Once | INTRAMUSCULAR | Status: AC
  Administered 2020-01-20: 4 mg via INTRAVENOUS
  Filled 2020-01-20: qty 2

## 2020-01-20 MED ORDER — VANCOMYCIN HCL IN DEXTROSE 1-5 GM/200ML-% IV SOLN
1000.0000 mg | Freq: Once | INTRAVENOUS | Status: AC
  Administered 2020-01-20: 1000 mg via INTRAVENOUS
  Filled 2020-01-20: qty 200

## 2020-01-20 MED ORDER — SODIUM CHLORIDE 0.9 % IV SOLN
1.0000 g | INTRAVENOUS | Status: DC
  Administered 2020-01-20 – 2020-01-23 (×4): 1 g via INTRAVENOUS
  Filled 2020-01-20 (×2): qty 1
  Filled 2020-01-20: qty 10
  Filled 2020-01-20: qty 1

## 2020-01-20 MED ORDER — POLYSACCHARIDE IRON COMPLEX 150 MG PO CAPS
150.0000 mg | ORAL_CAPSULE | Freq: Every day | ORAL | Status: DC
  Administered 2020-01-21 – 2020-01-24 (×4): 150 mg via ORAL
  Filled 2020-01-20 (×4): qty 1

## 2020-01-20 MED ORDER — ONDANSETRON HCL 4 MG/2ML IJ SOLN: 4.0000 mg | Freq: Once | INTRAMUSCULAR | Status: DC

## 2020-01-20 MED ORDER — ONDANSETRON HCL 4 MG PO TABS: 4.0000 mg | ORAL_TABLET | Freq: Once | ORAL | Status: DC

## 2020-01-20 MED ORDER — SODIUM CHLORIDE 0.9 % IV BOLUS
1000.0000 mL | Freq: Once | INTRAVENOUS | Status: AC
  Administered 2020-01-20: 1000 mL via INTRAVENOUS

## 2020-01-20 MED ORDER — GABAPENTIN 100 MG PO CAPS
100.0000 mg | ORAL_CAPSULE | Freq: Every day | ORAL | Status: DC
  Administered 2020-01-21: 100 mg via ORAL
  Filled 2020-01-20: qty 1

## 2020-01-20 MED ORDER — ASPIRIN EC 81 MG PO TBEC
81.0000 mg | DELAYED_RELEASE_TABLET | Freq: Every day | ORAL | Status: DC
  Administered 2020-01-21 – 2020-01-23 (×3): 81 mg via ORAL
  Filled 2020-01-20 (×3): qty 1

## 2020-01-20 MED ORDER — ENOXAPARIN SODIUM 40 MG/0.4ML ~~LOC~~ SOLN
40.0000 mg | SUBCUTANEOUS | Status: DC
  Administered 2020-01-20: 40 mg via SUBCUTANEOUS
  Filled 2020-01-20: qty 0.4

## 2020-01-20 NOTE — H&P (Signed)
History and Physical    [name] PIR:[id number] DOB: 02-26-1930 DOA: 01/20/2020  PCP: Audley Hose, MD  Patient coming from: Home, accompanied by daughter  I have personally briefly reviewed patient's old medical records in [place]  Chief Complaint: Altered mental status, hallucinations  HPI: [name] is a 86 y.o. female with medical history significant for History of cerebral emboli angioplasty, hypertension, PVD, OSA, CKD stage IIIa, dementia who presents with concerns of altered mental status.  Daughter at bedside provides the history.  She says that earlier this afternoon patient was doing okay but then later on around 6 PM she found her "fidgeting and shaking" in her chair and appeared confused.  She was having auditory and visual hallucinations.  Is seeing a man and his wife and kids on her bed.  Patient also vomited today and has some mild diarrhea but she is on stool softeners.  She has had decreased appetite and daughter usually has to force her to eat and drink fluids. She reportedly told ED physician that she was having abdominal pain or lower back pain although daughter does not recall her saying that at home.  She was hospitalized in May for acute metabolic encephalopathy concerning for cerebral amyloid angiopathy from MRI. Neurology recommended IV steroids for 5 days with follow up repeat MRI. She has since followed up with outpatient neurology and they recommended MRI with and without contrast in 6 months.   In the ED She was febrile up to 100.3, mildly tachycardic and hypertensive on room air.   No leukocytosis, stable Hgb of 10.5. Na of 138, K of 3.4, BG of 141, creatinine of 1.05 which is improved from baseline  CXR with marked enlargement of pulmonary arteries bilateral showing marked pulmonary arterial HTN  Review of Systems:  Unable to obtain due to patient's altered mental status  Past Medical History:  Diagnosis Date  . CAP (community  acquired pneumonia) 09/21/2015  . Chronic kidney disease, stage III (moderate)   . Encounter for long-term (current) use of other medications   . Glaucoma (increased eye pressure)   . Hard of hearing   . Head pain   . Hypertension   . Impaired glucose tolerance test   . Insomnia, unspecified   . Memory loss   . Morbid obesity ([place])   . Neck pain   . Osteoarthrosis, unspecified whether generalized or localized, other specified sites   . Other lymphedema   . Pain in joint, multiple sites   . Primary open-angle glaucoma(365.11)     Past Surgical History:  Procedure Laterality Date  . ABDOMINAL HYSTERECTOMY    . EYE SURGERY       reports that she has never smoked. She has never used smokeless tobacco. She reports that she does not drink alcohol and does not use drugs.  Allergies  Allergen Reactions  . Penicillins Hives    Has patient had a PCN reaction causing immediate rash, facial/tongue/throat swelling, SOB or lightheadedness with hypotension: No Has patient had a PCN reaction causing severe rash involving mucus membranes or skin necrosis: No Has patient had a PCN reaction that required hospitalization No Has patient had a PCN reaction occurring within the last 10 years: No If all of the above answers are "NO", then may proceed with Cephalosporin use.  . Tetanus Toxoid Hives    Family History  Problem Relation Age of Onset  . Other Mother        "old age"  .  Stomach cancer Father   . Stomach cancer Sister   . Aneurysm Brother      Prior to Admission medications   Medication Sig Start Date End Date Taking? Authorizing Provider  acetaminophen (TYLENOL) 500 MG tablet Take 1 tablet (500 mg total) by mouth at bedtime. 12/16/19  Yes Lassen, Arlo C, PA-C  aspirin 81 MG EC tablet TAKE 1 TABLET BY MOUTH EVERY DAY Patient taking differently: Take 81 mg by mouth daily.  12/19/19  Yes Lassen, Arlo C, PA-C  carboxymethylcellulose (ARTIFICIAL TEARS) 1 % ophthalmic solution Place 1  drop into both eyes 3 (three) times daily as needed (for lubercating eyes). 12/16/19  Yes Lassen, Arlo C, PA-C  [employer] VITAMIN C 500 MG tablet Take 1 tablet (500 mg total) by mouth daily. 12/16/19  Yes Lassen, Arlo C, PA-C  docusate sodium (COLACE) 50 MG capsule Take 1 capsule (50 mg total) by mouth 2 (two) times daily. Patient taking differently: Take 50-100 mg by mouth daily as needed for mild constipation.  12/16/19  Yes Oscar La, Arlo C, PA-C  furosemide (LASIX) 20 MG tablet Take 1 tablet (20 mg total) by mouth every Monday, Wednesday, and Friday. Patient taking differently: Take 20 mg by mouth daily.  12/17/19  Yes Lassen, Arlo C, PA-C  gabapentin (NEURONTIN) 100 MG capsule Take 1 tablets in AM and 1 tablet at lunch and continue 2 tablets at bedtime Patient taking differently: Take 100-200 mg by mouth See admin instructions. Take 100mg  in AM and 200mg  at bedtime 12/16/19  Yes Lassen, Arlo C, PA-C  Garlic 5638 MG CAPS Take 2 capsules (2,000 mg total) by mouth daily. Take 2 capsules to = 2000 mg 12/16/19  Yes Lassen, Arlo C, PA-C  iron polysaccharides (NIFEREX) 150 MG capsule Take 150 mg by mouth daily.   Yes [provider]  losartan (COZAAR) 50 MG tablet Take 50 mg by mouth daily.   Yes [provider]  spironolactone (ALDACTONE) 25 MG tablet Take 25 mg by mouth daily.   Yes [provider]  tetrahydrozoline 0.05 % ophthalmic solution Place 1 drop into both eyes daily as needed (eye irritation). 12/16/19  Yes Lassen, Arlo C, PA-C  trolamine salicylate (ASPERCREME) 10 % cream Apply 1 application topically as needed for muscle pain.   Yes [provider]  vitamin B-12 (CYANOCOBALAMIN) 1000 MCG tablet Take 1 tablet (1,000 mcg total) by mouth daily. 12/16/19  Yes Lassen, Arlo C, PA-C  gabapentin (NEURONTIN) 100 MG capsule Take 2 capsules (200 mg total) by mouth at bedtime. Patient not taking: Reported on 01/20/2020 12/16/19   Wille Celeste, PA-C  NON FORMULARY Diet Order:  Regular NAS, Heart Healthy Diet, thin liquids.    [provider]  polyethylene glycol (MIRALAX / GLYCOLAX) 17 g packet Take 17 g by mouth 2 (two) times daily. Can decrease once having daily stools Patient not taking: Reported on 01/20/2020 12/16/19   Wille Celeste, PA-C    Physical Exam: Vitals:   01/20/20 1630 01/20/20 1706 01/20/20 1842 01/20/20 1900  BP: (!) 189/94  (!) 135/55 135/85  Pulse: (!) 103  82 79  Resp: (!) 23  17 15   Temp:      TempSrc:      SpO2: 97%  93% 94%  Weight:  72.6 kg    Height:  4\' 9"  (1.448 m)      Constitutional: NAD, calm, comfortable, thin lethargic elderly female laying asleep in bed at 40 degree incline Vitals:   01/20/20 1630 01/20/20  1706 01/20/20 1842 01/20/20 1900  BP: (!) 189/94  (!) 135/55 135/85  Pulse: (!) 103  82 79  Resp: (!) 23  17 15   Temp:      TempSrc:      SpO2: 97%  93% 94%  Weight:  72.6 kg    Height:  4\' 9"  (1.448 m)     Eyes: PERRL, lids and conjunctivae normal ENMT: Mucous membranes are moist.  Neck: normal, supple Respiratory: clear to auscultation bilaterally, no wheezing, no crackles. Normal respiratory effort.  Cardiovascular: Regular rate and rhythm, no murmurs / rubs / gallops.  Bilateral distal lower extremity and ankle nonpitting edema. abdomen: Unable to assess tenderness given altered mental status but no redness or guarding during exam, no masses palpated. Bowel sounds positive.  Musculoskeletal: no clubbing / cyanosis. No joint deformity upper and lower extremities.  Chronic venous stasis changes of bilateral lower extremity with more erythema to right distal lower extremity and increased warmth to palpation. Skin: no rashes, lesions, ulcers. No induration Neurologic: Patient lethargic avoid awake to voice and light touch but would immediately fall back asleep.  Patient only able to mumble but not answer questions or follow commands.   Psychiatric: Altered mental status    Labs on Admission: I have  personally reviewed following labs and imaging studies  CBC: Recent Labs  Lab 01/20/20 1339  WBC 6.7  NEUTROABS 4.7  HGB 10.5*  HCT 34.2*  MCV 83.2  PLT 417   Basic Metabolic Panel: Recent Labs  Lab 01/20/20 1339  NA 138  K 3.4*  CL 96*  CO2 30  GLUCOSE 141*  BUN 18  CREATININE 1.05*  CALCIUM 9.2   GFR: Estimated Creatinine Clearance: 31.1 mL/min (A) (by C-G formula based on SCr of 1.05 mg/dL (H)). Liver Function Tests: Recent Labs  Lab 01/20/20 1339  AST 25  ALT 14  ALKPHOS 76  BILITOT 1.2  PROT 7.4  ALBUMIN 3.5   No results for input(s): LIPASE, AMYLASE in the last 168 hours. Recent Labs  Lab 01/20/20 1340  AMMONIA 15   Coagulation Profile: Recent Labs  Lab 01/20/20 1549  INR 1.1   Cardiac Enzymes: No results for input(s): CKTOTAL, CKMB, CKMBINDEX, TROPONINI in the last 168 hours. BNP (last 3 results) No results for input(s): PROBNP in the last 8760 hours. HbA1C: No results for input(s): HGBA1C in the last 72 hours. CBG: No results for input(s): GLUCAP in the last 168 hours. Lipid Profile: No results for input(s): CHOL, HDL, LDLCALC, TRIG, CHOLHDL, LDLDIRECT in the last 72 hours. Thyroid Function Tests: No results for input(s): TSH, T4TOTAL, FREET4, T3FREE, THYROIDAB in the last 72 hours. Anemia Panel: No results for input(s): VITAMINB12, FOLATE, FERRITIN, TIBC, IRON, RETICCTPCT in the last 72 hours. Urine analysis:    Component Value Date/Time   COLORURINE YELLOW 01/20/2020 1657   APPEARANCEUR CLEAR 01/20/2020 1657   LABSPEC 1.012 01/20/2020 1657   PHURINE 6.0 01/20/2020 1657   GLUCOSEU NEGATIVE 01/20/2020 1657   HGBUR SMALL (A) 01/20/2020 1657   BILIRUBINUR NEGATIVE 01/20/2020 [place] 01/20/2020 1657   PROTEINUR NEGATIVE 01/20/2020 1657   NITRITE NEGATIVE 01/20/2020 1657   LEUKOCYTESUR NEGATIVE 01/20/2020 1657    Radiological Exams on Admission: DG Chest Port 1 View  Result Date: 01/20/2020 CLINICAL DATA:   Confusion.  Altered mental status. EXAM: PORTABLE CHEST 1 VIEW COMPARISON:  Chest x-ray dated 11/26/2019 and CT angiogram of the chest dated 11/19/2018 FINDINGS: Heart size is normal. There is marked enlargement  of the pulmonary arteries bilaterally, increased since the prior study. Aortic atherosclerosis. No discrete infiltrates or effusions. No acute bone abnormality. IMPRESSION: Marked enlargement of the pulmonary arteries bilaterally, increased since the prior study. Findings are consistent with marked pulmonary arterial hypertension. Aortic Atherosclerosis (ICD10-I70.0). Electronically Signed   By: Lorriane Shire M.D.   On: 01/20/2020 17:32      Assessment/Plan Right lower extremity cellulitis w/chronic lymphedema Start IV Rocephin Follow blood cultures  Acute metabolic encephalopathy with hallucinations  Right lower extremity cellulitis unlikely to account for her hallucinations.  Patient has recent history of cerebral amyloid angioplasty treated with IV steroid per neurology.  Will obtain stat MRI brain She had follow-up with neurology back in May and EEG was planned.  Will obtain that here. Hold gabapentin for now  Hypertension Continue losartan, spironolactone, Lasix  PVD Continue aspirin  CKD stage IIIa Creatinine stable.  Avoid nephrotoxic agent.  DVT prophylaxis:.Lovenox Code Status: Full Family Communication: Plan discussed with daughter at bedside  disposition Plan: Home with at least 2 midnight stays  Consults called:  Admission status: inpatient   Status is: Inpatient  Remains inpatient appropriate because:Inpatient level of care appropriate due to severity of illness   Dispo: The patient is from: Home              Anticipated d/c is to: Home              Anticipated d/c date is: > 3 days              Patient currently is not medically stable to d/c.         Orene Desanctis DO [facility]   If 7PM-7AM, please contact  night-coverage [email or web address]   01/20/2020, 8:44 PM

## 2020-01-20 NOTE — ED Triage Notes (Signed)
Pt arrived via [facility] from doctor office for check up CC Confusion since 0200 this morning, " seeing hearing things that weren't there". Pt and family deny urininay symptoms or other deficits. WC at baseline able to to stand to assist and A/OX3 per EMS. Daughter otw.    Hx wound on right foot abx complete. Past admission for similar complaint.

## 2020-01-20 NOTE — Progress Notes (Signed)
Failed attempt at MRI. Patient unable to be positioned supine allowing for her head to fit into the coil.

## 2020-01-20 NOTE — ED Notes (Signed)
Messaged pharmacy to verify meds

## 2020-01-20 NOTE — ED Notes (Signed)
Only able to obtain 1 set of cultures due to difficult stick
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
## 2020-01-20 NOTE — ED Notes (Signed)
Pt transported to MRI 

## 2020-01-20 NOTE — ED Notes (Signed)
Pt transported to CT ?

## 2020-01-20 NOTE — ED Notes (Signed)
Per MRI staff pt was unable to hold head down for MRI. Not redirectable.

## 2020-01-20 NOTE — ED Notes (Signed)
Pt was given hot water and a [place] sandwich.

## 2020-01-21 ENCOUNTER — Other Ambulatory Visit: Payer: Medicare Other

## 2020-01-21 ENCOUNTER — Inpatient Hospital Stay (HOSPITAL_COMMUNITY): Payer: Medicare Other

## 2020-01-21 ENCOUNTER — Telehealth: Payer: Self-pay

## 2020-01-21 DIAGNOSIS — E854 Organ-limited amyloidosis: Principal | ICD-10-CM

## 2020-01-21 DIAGNOSIS — L03115 Cellulitis of right lower limb: Secondary | ICD-10-CM

## 2020-01-21 DIAGNOSIS — F0390 Unspecified dementia without behavioral disturbance: Secondary | ICD-10-CM

## 2020-01-21 LAB — URINE CULTURE

## 2020-01-21 LAB — BASIC METABOLIC PANEL
Anion gap: 9 (ref 5–15)
BUN: 15 mg/dL (ref 8–23)
CO2: 30 mmol/L (ref 22–32)
Calcium: 8.7 mg/dL — ABNORMAL LOW (ref 8.9–10.3)
Chloride: 99 mmol/L (ref 98–111)
Creatinine, Ser: 0.83 mg/dL (ref 0.44–1.00)
GFR calc Af Amer: >60 mL/min (ref >60)
GFR calc non Af Amer: >60 mL/min (ref >60)
Glucose, Bld: 116 mg/dL — ABNORMAL HIGH (ref 70–99)
Potassium: 3.4 mmol/L — ABNORMAL LOW (ref 3.5–5.1)
Sodium: 138 mmol/L (ref 135–145)

## 2020-01-21 LAB — CBC
HCT: 31.9 % — ABNORMAL LOW (ref 36.0–46.0)
Hemoglobin: 9.8 g/dL — ABNORMAL LOW (ref 12.0–15.0)
MCH: 25.1 pg — ABNORMAL LOW (ref 26.0–34.0)
MCHC: 30.7 g/dL (ref 30.0–36.0)
MCV: 81.8 fL (ref 80.0–100.0)
Platelets: 240 10*3/uL (ref 150–400)
RBC: 3.9 MIL/uL (ref 3.87–5.11)
RDW: 18.1 % — ABNORMAL HIGH (ref 11.5–15.5)
WBC: 4.5 10*3/uL (ref 4.0–10.5)
nRBC: 0 % (ref 0.0–0.2)

## 2020-01-21 MED ORDER — GADOBUTROL 1 MMOL/ML IV SOLN
7.0000 mL | Freq: Once | INTRAVENOUS | Status: AC | PRN
  Administered 2020-01-21: 7 mL via INTRAVENOUS

## 2020-01-21 MED ORDER — SODIUM CHLORIDE 0.9 % IV SOLN
1000.0000 mg | INTRAVENOUS | Status: AC
  Administered 2020-01-21 – 2020-01-22 (×2): 1000 mg via INTRAVENOUS
  Filled 2020-01-21 (×2): qty 8

## 2020-01-21 NOTE — ED Notes (Signed)
Pt placed on hospital bed

## 2020-01-21 NOTE — Progress Notes (Addendum)
PROGRESS NOTE  [name]:[id number] DOB: March 29, 1930 DOA: 01/20/2020 PCP: Audley Hose, MD  Brief History   86 year old woman PMH including dementia, cerebral amyloid angiopathy presented with altered mental status, auditory and visual hallucinations. In emergency department low-grade temperature 100.3. Admitted for right lower extremity cellulitis with chronic lymphedema.  Head imaging pursued, CT head questionable small amount of subarachnoid blood.  A & P  Right lower extremity cellulitis with lymphedema --Likely improving.  Continue empiric antibiotic.  Follow-up culture data.  Acute metabolic encephalopathy with hallucinations superimposed on dementia, cerebral amyloid angiopathy. --Etiology unclear.  May be progression of dementia or amyloid angiopathy.  Hallucinations resolved.  Probably close to baseline. --No evidence of infection other than right lower extremity cellulitis.  Follow-up MRI.  Per charted discussion with neurosurgery overnight, CT findings not felt to be significant or explanatory. --EEG planned as outpatient, will obtain --Gabapentin on hold for now  Possible small amount of subarachnoid blood cortical surface right frontal lobe. --Per chart discussion overnight, not felt to be significant.  Follow-up MRI.  No gross focal deficits noted on exam.  Essential hypertension --Stable.  Dementia --As above  Aortic atherosclerosis  Lymphedema  Chart review  Admit 4/20 to 11/29/19 for acute metabolic encephalopathy, concern for amyloid angiopathy.  Seen by neurology, treated with 5 days of IV steroids.  No outpatient steroids secondary to age per neurology. Followed by Dr. Marcial Pacas, neurology, and there was planned MRI brain without contrast for 01/26/2020 given concern for inflammatory process involving her amyloid angiopathy.  Treated with doxycycline by podiatrist for small abscess cellulitis dorsum right foot.   Addendum.  MRI shows subarachnoid  hemorrhage and subdural hematoma.  Also progression of amyloid angiopathy with inflammation.  I discussed with Dr. Ronnald Ramp neurosurgery who will see in consultation, no indication for transfer at this point.  Also consulted neurology for further recommendations.  Disposition Plan:  Discussion: Continue plan as above  Status is: Inpatient  Remains inpatient appropriate because:Altered mental status   Dispo: The patient is from: Home              Anticipated d/c is to: Home              Anticipated d/c date is: 2 days              Patient currently is not medically stable to d/c.  DVT prophylaxis:  enoxaparin (LOVENOX) injection 40 mg Start: 01/20/20 2200 Code Status: Full Family Communication: granddaughter at bedside  Murray Hodgkins, MD  [facility] Direct contact: see [email or web address] (further directions at bottom of note if needed) 7PM-7AM contact night coverage as at bottom of note 01/21/2020, 11:45 AM  LOS: 1 day   Significant Hospital Events   .    Consults:  .    Procedures:  .   Significant Diagnostic Tests:  . CT head faint hyperdensity right frontal lobe questionable for small amount of subarachnoid blood. . Chest x-ray no acute disease   Micro Data:  . SARS-CoV-2 negative . Blood cultures pending 6/22 . Urine culture pending 6/22   Antimicrobials:  .   Interval History/Subjective  Feels well, no complaints. Granddaughter at bedside reports intermittent confusion but no hallucinations today.  Patient has been confused at home, but hallucinations were new.  Objective   Vitals:  Vitals:   01/21/20 1012 01/21/20 1030  BP: (!) 150/63 (!) 154/60  Pulse:  96  Resp:  16  Temp:    SpO2:  99%  Exam:  Constitutional.  Appears calm and comfortable. Respiratory.  Clear to auscultation bilaterally.  No wheezes, rales or rhonchi.  Normal respiratory effort. Cardiovascular.  Regular rate and rhythm.  No murmur, rub or gallop.  2+ bilateral lower extremity  edema. Skin.  Right lower extremity warm, mild erythema noted.  Erythema over lower leg.  Healing lesion on dorsum appears to be healing blister.  No open wounds noted. Psychiatric.  Alert, oriented to self, year, not location or month.  Correctly states president and name of granddaughter although reports that she is her grandmother.. Neurologic.  Moves all extremities.  Cranial nerves appear grossly intact.  Left corneal scarring noted secondary to glaucoma.  I have personally reviewed the following:   Today's Data  . Potassium 3.4, remainder BMP unremarkable  . Hemoglobin stable at 9.8 . Infectious work-up unremarkable thus far  Scheduled Meds: . aspirin EC  81 mg Oral Daily  . enoxaparin (LOVENOX) injection  40 mg Subcutaneous Q24H  . furosemide  20 mg Oral Daily  . gabapentin  100 mg Oral Daily   And  . gabapentin  200 mg Oral QHS  . iron polysaccharides  150 mg Oral Daily  . losartan  50 mg Oral Daily  . spironolactone  25 mg Oral Daily  . vitamin B-12  1,000 mcg Oral Daily   Continuous Infusions: . cefTRIAXone (ROCEPHIN)  IV Stopped (01/20/20 2239)    Principal Problem:   Cellulitis of right leg Active Problems:   Hypertension   Chronic kidney disease, stage III (moderate)   Acute encephalopathy   PVD (peripheral vascular disease) (HCC)   Cerebral amyloid angiopathy (HCC)   AMS (altered mental status)   Dementia without behavioral disturbance ([place])   LOS: 1 day   How to contact the [facility] Attending or Consulting provider 7A - 7P or covering provider during after hours [place], for this patient?  1. Check the care team in [facility] and look for a) attending/consulting [facility] provider listed and b) the [facility] team listed 2. Log into [email or web address] and use [place]'s universal password to access. If you do not have the password, please contact the hospital operator. 3. Locate the [facility] provider you are looking for under [facility] and page to a number that you can be directly  reached. 4. If you still have difficulty reaching the provider, please page the [facility] (Director on Call) for the Hospitalists listed on amion for assistance.

## 2020-01-21 NOTE — Telephone Encounter (Signed)
I called the patient's daughter again and left her a message letting her know that we will make Dr. Krista Blue aware that [name] has been hospitalized. If she needs anything further, I provided our number to call back.

## 2020-01-21 NOTE — Progress Notes (Signed)
I responded to a [place] to provide Advance Directive information for the patient. I visited the patient's room with her daughter, Solmon Ice, present. I provided an overview of the AD and answered their questions. I left a copy of the AD for them to complete at a later time. I shared that the Chaplain is available for additional support as needed or requested.    01/21/20 1533  Clinical Encounter Type  Visited With Patient and family together  Visit Type Spiritual support  Referral From Nurse  Consult/Referral To Chaplain  Spiritual Encounters  Spiritual Needs Literature;Prayer    Chaplain Dr Redgie Grayer

## 2020-01-21 NOTE — Telephone Encounter (Signed)
Freddi Che called to discuss pt's appt today and left a VM asking for a call back.

## 2020-01-21 NOTE — Consult Note (Addendum)
Reason for Consult: sdh, sah Referring Physician: hospitalists  DELL HURTUBISE is an 86 y.o. female.   HPI:  86 year old female who was brought into [facility] yesterday after having some hallucinations that started Monday early morning. Her daughter is at the bedside and is the historian. According to her daughter, the patient woke up Monday morning at 2am wanting to sit in the recliner and refused to go back to bed. She states that she was having hallucinations of a man in the living room, a couple having oral sex in her bed, and kids playing in the bathtub. She has been having increase tremors in her hands and has been "fidgeting with her clothes." she walks slumped over because of her back. She was supposed to have back surgery years ago but refused to since her friend had a bad experience. She has been seeing neurology as an outpatient for "protein plaques" on her MRI brain according to her daughter. The patient is awake alert and eating lunch in bed during my examination  Past Medical History:  Diagnosis Date  . CAP (community acquired pneumonia) 09/21/2015  . Chronic kidney disease, stage III (moderate)   . Encounter for long-term (current) use of other medications   . Glaucoma (increased eye pressure)   . Hard of hearing   . Head pain   . Hypertension   . Impaired glucose tolerance test   . Insomnia, unspecified   . Memory loss   . Morbid obesity ([place])   . Neck pain   . Osteoarthrosis, unspecified whether generalized or localized, other specified sites   . Other lymphedema   . Pain in joint, multiple sites   . Primary open-angle glaucoma(365.11)     Past Surgical History:  Procedure Laterality Date  . ABDOMINAL HYSTERECTOMY    . EYE SURGERY      Allergies  Allergen Reactions  . Penicillins Hives    Has patient had a PCN reaction causing immediate rash, facial/tongue/throat swelling, SOB or lightheadedness with hypotension: No Has patient had a PCN reaction causing severe  rash involving mucus membranes or skin necrosis: No Has patient had a PCN reaction that required hospitalization No Has patient had a PCN reaction occurring within the last 10 years: No If all of the above answers are "NO", then may proceed with Cephalosporin use.  . Tetanus Toxoid Hives    Social History   Tobacco Use  . Smoking status: Never Smoker  . Smokeless tobacco: Never Used  Substance Use Topics  . Alcohol use: No    Alcohol/week: 0.0 standard drinks    Family History  Problem Relation Age of Onset  . Other Mother        "old age"  . Stomach cancer Father   . Stomach cancer Sister   . Aneurysm Brother      Review of Systems  Positive ROS: as above  All other systems have been reviewed and were otherwise negative with the exception of those mentioned in the HPI and as above.  Objective: Vital signs in last 24 hours: Temp:  [97.4 F (36.3 C)-99.6 F (37.6 C)] 99.6 F (37.6 C) (06/23 1443) Pulse Rate:  [69-103] 100 (06/23 1443) Resp:  [14-26] 21 (06/23 1443) BP: (108-189)/(51-94) 153/65 (06/23 1443) SpO2:  [93 %-100 %] 95 % (06/23 1443) Weight:  [72.6 kg] 72.6 kg (06/22 1706)  General Appearance: Alert, cooperative, no distress, appears stated age Head: Normocephalic, without obvious abnormality, atraumatic Eyes: PERRL, conjunctiva/corneas clear, EOM's intact, fundi  benign, both eyes      Lungs: respirations unlabored Heart: Regular rate and rhythm Pulses: 2+ and symmetric all extremities Skin: Skin color, texture, turgor normal, no rashes or lesions  NEUROLOGIC:   Mental status: A&O x4, no aphasia, good attention span, Memory and fund of knowledge Motor Exam - grossly normal, normal tone and bulk Sensory Exam - grossly normal Reflexes: symmetric, no pathologic reflexes, No Hoffman's, No clonus Coordination - not tested Gait -not tested Balance - not tested Cranial Nerves: I: smell Not tested  II: visual acuity  OS: na    OD: na  II: visual fields  Full to confrontation  II: pupils Equal, round, reactive to light  III,VII: ptosis None  III,IV,VI: extraocular muscles  Full ROM  V: mastication Normal  V: facial light touch sensation  Normal  V,VII: corneal reflex  Present  VII: facial muscle function - upper  Normal  VII: facial muscle function - lower Normal  VIII: hearing Not tested  IX: soft palate elevation  Normal  IX,X: gag reflex Present  XI: trapezius strength  5/5  XI: sternocleidomastoid strength 5/5  XI: neck flexion strength  5/5  XII: tongue strength  Normal    Data Review Lab Results  Component Value Date   WBC 4.5 01/21/2020   HGB 9.8 (L) 01/21/2020   HCT 31.9 (L) 01/21/2020   MCV 81.8 01/21/2020   PLT 240 01/21/2020   Lab Results  Component Value Date   NA 138 01/21/2020   K 3.4 (L) 01/21/2020   CL 99 01/21/2020   CO2 30 01/21/2020   BUN 15 01/21/2020   CREATININE 0.83 01/21/2020   GLUCOSE 116 (H) 01/21/2020   Lab Results  Component Value Date   INR 1.1 01/20/2020    Radiology: CT HEAD WO CONTRAST  Result Date: 01/20/2020 CLINICAL DATA:  Altered mental status EXAM: CT HEAD WITHOUT CONTRAST TECHNIQUE: Contiguous axial images were obtained from the base of the skull through the vertex without intravenous contrast. COMPARISON:  MRI 11/22/2019, CT brain 10/28/2016 FINDINGS: Brain: White matter hypodensity/edema within the right parietal and temporal lobes. No significant mass effect. No midline shift. Questionable subtle hyperdensity at right frontal sulcus. Stable ventricle size. Vascular: No hyperdense vessels.  Carotid vascular calcification Skull: Normal. Negative for fracture or focal lesion. Sinuses/Orbits: No acute finding. Other: None IMPRESSION: 1. Similar pattern of white matter hypodensity/edema within the right parietal and temporal lobes as compared with MRI from April 2021 2. Faint hyperdensity at the cortical surface of the right frontal lobe is questionable for a small amount of subarachnoid  blood. Electronically Signed   By: Donavan Foil M.D.   On: 01/20/2020 23:11   MR BRAIN WO CONTRAST  Result Date: 01/21/2020 CLINICAL DATA:  Altered mental status.  Encephalopathy. EXAM: MRI HEAD WITHOUT CONTRAST TECHNIQUE: Multiplanar, multiecho pulse sequences of the brain and surrounding structures were obtained without intravenous contrast. COMPARISON:  CT head 01/20/2020. MRI head 11/22/2019, 11/19/2019, 11/18/2019. FINDINGS: Brain: Rapid sequence scanning performed. The patient was not able to complete the study. No postcontrast images obtained. There was IV infiltration. Patient refused further imaging. White matter hyperintensity in the right temporal lobe has progressed since prior MRI studies. White matter hyperintensity in the posterior parietal lobe is stable. 15 mm hyperintensity in the right frontal convexity white matter has progressed Hyperintensity in the left anterior occipital lobe has progressed since the prior study. High signal in the right frontal sulcus on FLAIR suggestive of recent hemorrhage is new since prior  MRI. Small subdural fluid collection lateral the right temporal lobe may also represent small area of subdural hemorrhage. In addition, there are scattered areas of microhemorrhage in the posterior parietal lobe bilaterally and in the right temporal lobe unchanged. Vascular: Normal arterial flow voids. Skull and upper cervical spine: Negative Sinuses/Orbits: Mild mucosal edema left maxillary sinus. Bilateral cataract extraction. Other: None IMPRESSION: 1. Multiple areas of white matter edema with progression in the right temporal lobe, right high frontal lobe, and left anterior occipital lobe. Right posterior parietal lobe lesion appears similar in size. There is associated microhemorrhage with these lesions. These lesions did not enhance on prior studies. Favor cerebral amyloid with inflammation. 2. Interval development of mild subarachnoid hemorrhage on the right as well as  right temporal subdural hemorrhage. Electronically Signed   By: Franchot Gallo M.D.   On: 01/21/2020 10:12   DG Chest Port 1 View  Result Date: 01/20/2020 CLINICAL DATA:  Confusion.  Altered mental status. EXAM: PORTABLE CHEST 1 VIEW COMPARISON:  Chest x-ray dated 11/26/2019 and CT angiogram of the chest dated 11/19/2018 FINDINGS: Heart size is normal. There is marked enlargement of the pulmonary arteries bilaterally, increased since the prior study. Aortic atherosclerosis. No discrete infiltrates or effusions. No acute bone abnormality. IMPRESSION: Marked enlargement of the pulmonary arteries bilaterally, increased since the prior study. Findings are consistent with marked pulmonary arterial hypertension. Aortic Atherosclerosis (ICD10-I70.0). Electronically Signed   By: Lorriane Shire M.D.   On: 01/20/2020 17:32     Assessment/Plan: 86 year old presented to the hospital 2 days ago with increased confusion and hallucinations. MRI head shows multiple areas of white matter edema with very small right temporal sah and sdh. I do not believe that these small hemorrhages are the cause of her confusion/hallucinations. Given her history from the daughter, I do think that this is likely parkinsons and dementia. Would recommend neurology see her as an inpatient. No surgical intervention is needed at this time. Will sign off for now. Please call us if we can be of further assistance.    Ocie [facility] 01/21/2020 4:28 PM

## 2020-01-21 NOTE — Progress Notes (Signed)
Informed by  Pt.'s Primary Nurse Madlyn Frankel that pt.'s family preferred for IV placement at pt.'s hands and not on arms /forearms. Obtained PIV at right ant forearm w/ G20 w/ GBR, dressed and secured. Per assessment, unable to start PIV on pt.'s hands due to small veins  and unstable site  Pt. Is on IV antibiotics. RN made aware.

## 2020-01-21 NOTE — ED Notes (Signed)
Patient is requesting food, there is an NPO order at this time. Patient's daughter requesting patient eat. RN messaged doctor to make him aware of request.

## 2020-01-21 NOTE — Progress Notes (Signed)
Patient was unable to lay supine to obtain her MRI brain so a stat CT head was ordered instead. Results returned shows faint hyperdensity at the cortical surface of right frontal lobe questionable for small amount of subarachnoid blood.   I discussed case with neurosurgery Dr. Cato Mulligan and he believes it is likely an incidential finding and does not account for her hallucination symptoms. He recommends re-attempting MRI brain imaging as planned. No repeat CT head needed.

## 2020-01-21 NOTE — Telephone Encounter (Signed)
Left message for a return call

## 2020-01-22 ENCOUNTER — Inpatient Hospital Stay (HOSPITAL_COMMUNITY)
Admit: 2020-01-22 | Discharge: 2020-01-22 | Disposition: A | Discharge status: Home / Self Care | Payer: Medicare Other | Attending: Neurology | Admitting: Neurology

## 2020-01-22 DIAGNOSIS — I680 Cerebral amyloid angiopathy: Secondary | ICD-10-CM

## 2020-01-22 DIAGNOSIS — G9340 Encephalopathy, unspecified: Secondary | ICD-10-CM

## 2020-01-22 MED ORDER — ENSURE MAX PROTEIN PO LIQD
11.0000 [oz_av] | Freq: Every day | ORAL | Status: DC
  Administered 2020-01-22 – 2020-01-24 (×3): 11 [oz_av] via ORAL

## 2020-01-22 MED ORDER — POLYVINYL ALCOHOL 1.4 % OP SOLN
1.0000 [drp] | OPHTHALMIC | Status: DC | PRN
  Administered 2020-01-22 – 2020-01-23 (×2): 1 [drp] via OPHTHALMIC
  Filled 2020-01-22 (×2): qty 15

## 2020-01-22 NOTE — Procedures (Signed)
Patient Name: [name]  MRN: [id number]  Epilepsy Attending: Lora Havens  Referring Physician/Provider: Dr Ileene Musa Date: 01/22/2020 Duration: 24.54 minutes  Patient history: 86 year old female with cerebral amyloid angiopathy presented with altered mental status.  EEG evaluate for seizures.  Level of alertness: Awake, drowsy, sleep, comatose, lethargic  AEDs during EEG study: None  Technical aspects: This EEG study was done with scalp electrodes positioned according to the 10-20 International system of electrode placement. Electrical activity was acquired at a sampling rate of 500Hz  and reviewed with a high frequency filter of 70Hz  and a low frequency filter of 1Hz . EEG data were recorded continuously and digitally stored.   Description: The posterior dominant rhythm consists of 8Hz  activity of moderate voltage (25-35 uV) seen predominantly in posterior head regions, symmetric and reactive to eye opening and eye closing. EEG showed intermittent generalized 3 to 6 Hz theta-delta slowing. Triphasic waves, generalized and maximal bifrontal were also seen. Hyperventilation and photic stimulation were not performed.     ABNORMALITY -Intermittent slow, generalized -Triphasic waves, generalized  IMPRESSION: This study is suggestive of mild diffuse encephalopathy, nonspecific etiology but could be related to toxic-metabolic etiology. No seizures or definite epileptiform discharges were seen throughout the recording.    Damien Cisar Barbra Sarks

## 2020-01-22 NOTE — Consult Note (Signed)
Neurology Consultation Reason for Consult: Worsening confusion Referring Physician: Sarajane Jews, D  CC: Worsening confusion  History is obtained from: Chart review, patient  HPI: [name] is a 86 y.o. female with a history of recently diagnosed amyloid angiitis based on imaging,  She has had a fairly extensive work-up including ANA, ACE, RPR which were negative.  ESR was 63 and CRP 20.  CSF WBC zero, protein 80, glucose 79.  She was treated with high-dose IV Solu-Medrol with some improvement previously.  She went home and was doing okay, she has good days and bad days, but doing ok. She then had a fairly abrupt worsening yesterday, with visual hallucinations and is for this reason that she was brought back into the emergency department.  Once here, CT scan revealed some subtle small areas of hemorrhage which was confirmed on MRI.  The MRI revealed worsening of her previously seen hyperintensities on T2.  This again is felt of most likely represents cerebral amyloid angiitis.    ROS: A 14 point ROS was performed and is negative except as noted in the HPI.   Past Medical History:  Diagnosis Date  . CAP (community acquired pneumonia) 09/21/2015  . Chronic kidney disease, stage III (moderate)   . Encounter for long-term (current) use of other medications   . Glaucoma (increased eye pressure)   . Hard of hearing   . Head pain   . Hypertension   . Impaired glucose tolerance test   . Insomnia, unspecified   . Memory loss   . Morbid obesity ([place])   . Neck pain   . Osteoarthrosis, unspecified whether generalized or localized, other specified sites   . Other lymphedema   . Pain in joint, multiple sites   . Primary open-angle glaucoma(365.11)      Family History  Problem Relation Age of Onset  . Other Mother        "old age"  . Stomach cancer Father   . Stomach cancer Sister   . Aneurysm Brother      Social History:  reports that she has never smoked. She has never used  smokeless tobacco. She reports that she does not drink alcohol and does not use drugs.   Exam: Current vital signs: BP (!) 144/75 (BP Location: Left Arm)   Pulse 74   Temp (!) 97.4 F (36.3 C)   Resp 18   Ht '4\' 9"'$  (1.448 m)   Wt 72.6 kg   SpO2 100%   BMI 34.62 kg/m  Vital signs in last 24 hours: Temp:  [97.4 F (36.3 C)-99.6 F (37.6 C)] 97.4 F (36.3 C) (06/24 0516) Pulse Rate:  [74-103] 74 (06/24 0516) Resp:  [16-21] 18 (06/24 0516) BP: (138-154)/(60-75) 144/75 (06/24 0516) SpO2:  [93 %-100 %] 100 % (06/24 0516)   Physical Exam  Constitutional: Appears well-developed and well-nourished.  Psych: Affect appropriate to situation Eyes: No scleral injection, corneal opacification on the left HENT: No OP obstrucion MSK: no joint deformities.  Cardiovascular: Normal rate and regular rhythm.  Respiratory: Effort normal, non-labored breathing GI: Soft.  No distension. There is no tenderness.  Skin: WDI  Neuro: Mental Status: Patient is awake, alert, oriented to person, place, month, year, and situation. Patient is able to give a clear and coherent history. No signs of aphasia or neglect Cranial Nerves: II: She has a left hemianopia in her right eye and is blind in the left eye pupils are equal, round, and reactive to light.   III,IV, VI:  EOMI without ptosis or diploplia.  V: Facial sensation is symmetric to temperature VII: Facial movement is symmetric.  VIII: hearing is intact to voice X: Uvula elevates symmetrically XI: Shoulder shrug is symmetric. XII: tongue is midline without atrophy or fasciculations.  Motor: Tone is normal. Bulk is normal.  She has no drift, is able to give good effort in bilateral upper extremities with relatively symmetric strength, she has poor effort bilateral lower extremities, 4/5 strength. Sensory: Sensation is symmetric to light touch and temperature in the arms and legs. Cerebellar: she does have mild tremor without past-pointing  bilaterally  I have reviewed labs in epic and the results pertinent to this consultation are: BMP is unremarkable other than mild hypokalemia at 3.4  I have reviewed the images obtained: MRI brain-mild worsening of previously identified T2 lesions, these were nonenhancing on previous studies.  Impression: 86 year old female with previously diagnosed cerebral amyloid angiopathy with radiographic progression and presentation consistent with delirium.  Given worsening mental status and MRI findings, I do think that further steroid therapy is prudent.  I do not know that repeating a full 5 days of steroids so soon after her previous pulse dose is indicated therefore I would limit her to two or 3 days of IV Solu-Medrol followed by oral prednisone.  Recommendations: 1) Solu-Medrol day two 2) following Solu-Medrol would start her on prednisone 60 mg/day 3) EEG 4) neurology will continue to follow   Roland Rack, MD [facility] [phone]  If 7pm- 7am, please page neurology on call as listed in [facility].

## 2020-01-22 NOTE — Progress Notes (Signed)
EEG completed, results pending. 

## 2020-01-22 NOTE — Progress Notes (Signed)
Initial Nutrition Assessment  DOCUMENTATION CODES:   Obesity unspecified  INTERVENTION:   -Ensure MAX Protein po daily, each supplement provides 150 kcal and 30 grams of protein  NUTRITION DIAGNOSIS:   Inadequate oral intake related to poor appetite as evidenced by  (chart review).  GOAL:   Patient will meet greater than or equal to 90% of their needs  MONITOR:   PO intake, Supplement acceptance, Labs, Weight trends, I & O's  REASON FOR ASSESSMENT:   Malnutrition Screening Tool    ASSESSMENT:   86 year old woman PMH including dementia, cerebral amyloid angiopathy presented with altered mental status, auditory and visual hallucinations. In emergency department low-grade temperature 100.3. Admitted for right lower extremity cellulitis with chronic lymphedema.  **RD working remotely**  Patient admitted on 6/22 with confusion and hallucinations. Per MD note, pt with dementia. Currently a/o x 4. Attempted to speak with pt over the phone, no answer. Per chart review, pt with reported poor appetite and lower PO intake recently with mental status changes. Will order Ensure Max for additional protein.  Per weight records, pt with overall weight increase over the past year. Uncertain if this is fluid related. Per nursing documentation, pt with moderate BLE edema.   I/Os: +1.5L since admit UOP: 300 ml x 24 hrs  Medications: Lasix, Niferex, Vitamin B-12 Labs reviewed: Low K  NUTRITION - FOCUSED PHYSICAL EXAM:  RD working remotely.  Diet Order:   Diet Order            DIET DYS 3 Room service appropriate? Yes; Fluid consistency: Thin  Diet effective now                 EDUCATION NEEDS:   No education needs have been identified at this time  Skin:  Skin Assessment: Reviewed RN Assessment  Last BM:  6/23  Height:   Ht Readings from Last 1 Encounters:  01/20/20 4\' 9"  (1.448 m)    Weight:   Wt Readings from Last 1 Encounters:  01/20/20 72.6 kg   BMI:   Body mass index is 34.62 kg/m.  Estimated Nutritional Needs:   Kcal:  1350-1550  Protein:  55-70g  Fluid:  1.5L/day  Clayton Bibles, MS, RD, LDN Inpatient Clinical Dietitian Contact information available via Am[employer]

## 2020-01-22 NOTE — Care Plan (Signed)
Advance Care Planning   Purpose of Encounter To assess patient's current status with daughter/healthcare power of attorney and to assess goals of care.   Parties in Attendance Daughter  Discussion:  Discussed in detail regarding  patient's current condition, diagnoses and recommended treatment.   She reported understanding that if the condition worsens, there is little else that can be done and hospice would probably be appropriate at that time.  She expressed understanding of this.  At this time she plans to take the patient back home.    Discussed CODE STATUS.  The patient/POA/family decided   At this time patient remains full code although she plans to discuss this with 2 of her daughters who are both nurses.  She will update me if she makes a decision to change CODE STATUS.   Murray Hodgkins, MD [facility]

## 2020-01-22 NOTE — Progress Notes (Addendum)
PROGRESS NOTE  [name] LFY:[id number] DOB: August 31, 1929 DOA: 01/20/2020 PCP: Audley Hose, MD  Brief History   86 year old woman PMH including dementia, cerebral amyloid angiopathy presented with altered mental status, auditory and visual hallucinations. In emergency department low-grade temperature 100.3. Admitted for right lower extremity cellulitis with chronic lymphedema.  Head imaging pursued, CT head questionable small amount of subarachnoid blood.  A & P  Right lower extremity cellulitis with lymphedema --Appears to be rapidly resolving.  Change to oral antibiotics 6/25.   Acute metabolic encephalopathy with hallucinations superimposed on dementia, secondary to progressive cerebral amyloid angiopathy. --Hallucinations resolved.   --MRI shows progressive cerebral amyloid angiopathy.  [place] neurology.  Will continue high-dose steroids today.  Discharge on oral prednisone. --EEG nonspecific --Gabapentin remains on hold for now  Small subarachnoid hemorrhage, subdural hematoma, spontaneous --Discussed with neurosurgery, seen by neurosurgery, no further evaluation suggested.  Essential hypertension --Remained stable, at goal.  Dementia --Appears stable.  Aortic atherosclerosis  Lymphedema  Disposition Plan:  Discussion: Seems to be improving.  Continue steroids high-dose today.  Reevaluate in a.m.  Further recommendations per neurology.  Status is: Inpatient  Remains inpatient appropriate because:Altered mental status   Dispo: The patient is from: Home              Anticipated d/c is to: Home              Anticipated d/c date is: 2 days or 1 day              Patient currently is not medically stable to d/c.  DVT prophylaxis:   Code Status: Full Family Communication: Daughter at bedside  Murray Hodgkins, MD  [facility] Direct contact: see [email or web address] (further directions at bottom of note if needed) 7PM-7AM contact night coverage as at bottom  of note 01/22/2020, 11:54 AM  LOS: 2 days   Significant Hospital Events   . 6/22 admitted for confusion   Consults:  . Neurosurgery . Neurology   Procedures:  .   Significant Diagnostic Tests:  . CT head faint hyperdensity right frontal lobe questionable for small amount of subarachnoid blood. . Chest x-ray no acute disease . MRI brain multiple areas of white matter edema with progression.  Associated microhemorrhage.  Favor cerebral amyloid with inflammation.  Mild subarachnoid hemorrhage.  Right temporal subdural hemorrhage.   Micro Data:  . SARS-CoV-2 negative . Blood cultures pending 6/22 . Urine culture pending 6/22   Antimicrobials:  .   Interval History/Subjective  Patient has no complaints today.  Daughter feels like the patient has improved, no hallucinations.  Objective   Vitals:  Vitals:   01/21/20 2236 01/22/20 0516  BP: 138/71 (!) 144/75  Pulse: 87 74  Resp: 18 18  Temp: 98 F (36.7 C) (!) 97.4 F (36.3 C)  SpO2: 100% 100%    Exam:  Constitutional.  Appears calm and comfortable. Psychiatric.  Speech fluent and clear.  Not always appropriate. Cardiovascular.  Regular rate and rhythm.  No murmur, rub or gallop. Respiratory.  Clear to auscultation bilaterally.  No wheezes, rales or rhonchi.  Normal respiratory effort. Musculoskeletal.  Moves all extremities. Skin.  Right lower extremity appears unremarkable.  I have personally reviewed the following:   Today's Data  . No new labs. Marland Kitchen MRI brain yesterday noted.  Scheduled Meds: . aspirin EC  81 mg Oral Daily  . furosemide  20 mg Oral Daily  . iron polysaccharides  150 mg Oral Daily  . losartan  50  mg Oral Daily  . spironolactone  25 mg Oral Daily  . vitamin B-12  1,000 mcg Oral Daily   Continuous Infusions: . cefTRIAXone (ROCEPHIN)  IV 1 g (01/21/20 2100)  . methylPREDNISolone (SOLU-MEDROL) injection 1,000 mg (01/21/20 1723)    Principal Problem:   Cellulitis of right leg Active  Problems:   Hypertension   Chronic kidney disease, stage III (moderate)   Acute encephalopathy   PVD (peripheral vascular disease) (HCC)   Cerebral amyloid angiopathy (HCC)   AMS (altered mental status)   Dementia without behavioral disturbance ([place])   LOS: 2 days   How to contact the [facility] Attending or Consulting provider 7A - 7P or covering provider during after hours [place], for this patient?  1. Check the care team in [facility] and look for a) attending/consulting [facility] provider listed and b) the [facility] team listed 2. Log into [email or web address] and use [place]'s universal password to access. If you do not have the password, please contact the hospital operator. 3. Locate the [facility] provider you are looking for under [facility] and page to a number that you can be directly reached. 4. If you still have difficulty reaching the provider, please page the [facility] (Director on Call) for the Hospitalists listed on amion for assistance.

## 2020-01-23 MED ORDER — CALCIUM CARBONATE ANTACID 500 MG PO CHEW: 1.0000 | CHEWABLE_TABLET | Freq: Every day | ORAL | 0 refills | Status: AC

## 2020-01-23 MED ORDER — DOCUSATE SODIUM 50 MG PO CAPS: 50.0000 mg | ORAL_CAPSULE | Freq: Every day | ORAL | Status: DC | PRN

## 2020-01-23 MED ORDER — PANTOPRAZOLE SODIUM 40 MG PO TBEC
40.0000 mg | DELAYED_RELEASE_TABLET | Freq: Every day | ORAL | 1 refills | Status: DC
Start: 2020-01-23 — End: 2020-05-13

## 2020-01-23 MED ORDER — ENSURE MAX PROTEIN PO LIQD: 11.0000 [oz_av] | Freq: Every day | ORAL | Status: DC

## 2020-01-23 MED ORDER — CEPHALEXIN 500 MG PO CAPS: 500.0000 mg | ORAL_CAPSULE | Freq: Two times a day (BID) | ORAL | 0 refills | Status: AC

## 2020-01-23 MED ORDER — PREDNISONE 20 MG PO TABS
40.0000 mg | ORAL_TABLET | Freq: Every day | ORAL | Status: DC
  Administered 2020-01-23 – 2020-01-24 (×2): 40 mg via ORAL
  Filled 2020-01-23 (×2): qty 2

## 2020-01-23 MED ORDER — GABAPENTIN 100 MG PO CAPS: 100.0000 mg | ORAL_CAPSULE | ORAL | Status: DC

## 2020-01-23 MED ORDER — PREDNISONE 20 MG PO TABS: 40.0000 mg | ORAL_TABLET | Freq: Every day | ORAL | 0 refills | Status: DC

## 2020-01-23 NOTE — Evaluation (Signed)
Physical Therapy Evaluation Patient Details Name: [name] MRN: [id number] DOB: 09/19/1929 Today's Date: 01/23/2020   History of Present Illness  Pt is an 86 year old female admitted for acute metabolic encephalopathy with hallucinations superimposed on dementia, secondary to progressive cerebral amyloid angiopathy.  Clinical Impression  Pt admitted with above diagnosis.  Pt currently with functional limitations due to the deficits listed below (see PT Problem List). Pt will benefit from skilled PT to increase their independence and safety with mobility to allow discharge to the venue listed below.  Pt assisted to recliner and then requested to use BSC.  Pt then returned to recliner and too fatigued to ambulate.  Pt currently min/guard to min assist for mobility.  Pt was receiving home health services per daughter prior to this admission.     Follow Up Recommendations Supervision/Assistance - 24 hour;Home health PT    Equipment Recommendations  None recommended by PT    Recommendations for Other Services       Precautions / Restrictions Precautions Precautions: Fall      Mobility  Bed Mobility Overal bed mobility: Needs Assistance Bed Mobility: Supine to Sit     Supine to sit: Min guard     General bed mobility comments: increased time  Transfers Overall transfer level: Needs assistance Equipment used: Rolling walker (2 wheeled) Transfers: Sit to/from [facility] Sit to Stand: Min assist Stand pivot transfers: Min assist       General transfer comment: slight assist for rise, cues for hand placement, pt relies on UE assist  Ambulation/Gait             General Gait Details: too fatigued (had hygiene in bed with nursing prior to PT due to BM and then pt assisted to [facility] to have another small BM this session)  Stairs            Wheelchair Mobility    Modified Rankin (Stroke Patients Only)       Balance Overall balance  assessment: Needs assistance         Standing balance support: Bilateral upper extremity supported Standing balance-Leahy Scale: Poor Standing balance comment: reliant on UE support                             Pertinent Vitals/Pain Pain Assessment: No/denies pain Pain Intervention(s): Repositioned    Home Living Family/patient expects to be discharged to:: Private residence Living Arrangements: Children (daughter) Available Help at Discharge: Family;Available PRN/intermittently Type of Home: House       Home Layout: One level Home Equipment: [place] - 2 wheels;Cane - single point;Grab bars - tub/shower;Shower seat;Walker - 4 wheels;Wheelchair - manual      Prior Function Level of Independence: Independent with assistive device(s)         Comments: pt recently at SNF for rehab and currently receiving ST, OT, PT home health     Hand Dominance        Extremity/Trunk Assessment        Lower Extremity Assessment Lower Extremity Assessment: Generalized weakness       Communication   Communication: HOH;Expressive difficulties  Cognition Arousal/Alertness: Awake/alert Behavior During Therapy: Flat affect Overall Cognitive Status: History of cognitive impairments - at baseline  General Comments      Exercises     Assessment/Plan    PT Assessment Patient needs continued PT services  PT Problem List Decreased balance;Decreased range of motion;Decreased strength;Decreased mobility;Decreased knowledge of use of DME;Decreased activity tolerance       PT Treatment Interventions DME instruction;Gait training;Balance training;Therapeutic exercise;Functional mobility training;Therapeutic activities;Patient/family education    PT Goals (Current goals can be found in the Care Plan section)  Acute Rehab PT Goals PT Goal Formulation: With patient Time For Goal Achievement: 02/05/20 Potential to  Achieve Goals: Good    Frequency Min 3X/week   Barriers to discharge        Co-evaluation               AM-PAC PT "6 Clicks" Mobility  Outcome Measure Help needed turning from your back to your side while in a flat bed without using bedrails?: A Little Help needed moving from lying on your back to sitting on the side of a flat bed without using bedrails?: A Little Help needed moving to and from a bed to a chair (including a wheelchair)?: A Little Help needed standing up from a chair using your arms (e.g., wheelchair or bedside chair)?: A Little Help needed to walk in hospital room?: A Little Help needed climbing 3-5 steps with a railing? : A Lot 6 Click Score: 17    End of Session Equipment Utilized During Treatment: Gait belt Activity Tolerance: Patient tolerated treatment well Patient left: in chair;with call bell/phone within reach;with chair alarm set;with family/visitor present Nurse Communication: Mobility status PT Visit Diagnosis: Other abnormalities of gait and mobility (R26.89)    Time: 4403-4742 PT Time Calculation (min) (ACUTE ONLY): 33 min   Charges:   PT Evaluation $PT Eval Low Complexity: 1 Low PT Treatments $Therapeutic Activity: 8-22 mins       Jannette Spanner PT, DPT Acute Rehabilitation Services Pager: [phone] Office: [phone]  York Ram E 01/23/2020, 4:29 PM

## 2020-01-23 NOTE — Discharge Summary (Signed)
Physician Discharge Summary  [name] KNL:[id number] DOB: August 06, 1929 DOA: 01/20/2020  PCP: Audley Hose, MD  Admit date: 01/20/2020 Discharge date: 01/23/2020  Recommendations for Outpatient Follow-up:   Acute metabolic encephalopathy with hallucinations superimposed on dementia, secondary to progressive cerebral amyloid angiopathy. --MRI showed progressive cerebral amyloid angiopathy.  EEG nonspecific Treated with IV Solu-Medrol, now home on prednisone 40 mg daily, potassium supplementation and Protonix. --Follow-up with Dr. Leonie Man in 2 weeks for consideration of steroid sparing therapy, possible Cytoxan or Rituxan     Follow-up Information    Garvin Fila, MD. Call in 1 week(s).   Specialties: Neurology, Radiology Why: office will call with appointment Contact information: [place] [address] [phone]                Discharge Diagnoses: Principal diagnosis is #1 1. Acute metabolic encephalopathy with hallucinations superimposed on dementia, secondary to progressive cerebral amyloid angiopathy. 2. Small subarachnoid hemorrhage, subdural hematoma, spontaneous 3. Right lower extremity cellulitis with lymphedema 4. Essential hypertension 5. Dementia 6. Aortic atherosclerosis  Discharge Condition: improved Disposition: home, resume HH  Diet recommendation: dysphagia 3 diet, thin liquids  Filed Weights   01/20/20 1706  Weight: 72.6 kg    History of present illness:  86 year old woman PMH including dementia, cerebral amyloid angiopathy presented with altered mental status, auditory and visual hallucinations. In emergency department low-grade temperature 100.3. Admitted for right lower extremity cellulitis with chronic lymphedema.  Head imaging pursued, CT head questionable small amount of subarachnoid blood.   Hospital Course:  Further evaluation revealed most likely cause of hallucinations and mental worsening was  cerebral amyloid angiopathy.  Seen by neurology with recommendation for high-dose steroids and transition to oral steroids.  Seen by neurosurgery from small subdural hematoma and subarachnoid hemorrhage.  No further intervention recommended.  Mentation improved, hallucinations resolved and the patient is alert and oriented.  Also was treated for mild right lower extremity cellulitis which has resolved.  As she continues to improve, felt stable for discharge.  Has been active with home health PT and daughter would not pursue SNF regardless.  Right lower extremity cellulitis with lymphedema --Appears to be rapidly resolving.  Change to oral antibiotics 6/25 and finished short course.  Acute metabolic encephalopathy with hallucinations superimposed on dementia, secondary to progressive cerebral amyloid angiopathy. --Hallucinations resolved, mentation improved.  No confusion today. --MRI showed progressive cerebral amyloid angiopathy.  EEG nonspecific --Appreciate neurology.  Treated with IV Solu-Medrol, now home on prednisone 40 mg daily, potassium supplementation and Protonix. --Follow-up with Dr. Leonie Man in 2 weeks for consideration of steroid sparing therapy, possible Cytoxan or Rituxan  Small subarachnoid hemorrhage, subdural hematoma, spontaneous --Discussed with neurosurgery, seen by neurosurgery, no further evaluation suggested.  Essential hypertension --Remained stable  Dementia --Appears stable.  Aortic atherosclerosis   Significant Hospital Events    6/22 admitted for confusion  Consults:   Neurosurgery  Neurology  Procedures:   None  Significant Diagnostic Tests:   CT head faint hyperdensity right frontal lobe questionable for small amount of subarachnoid blood.  Chest x-ray no acute disease  MRI brain multiple areas of white matter edema with progression.  Associated microhemorrhage.  Favor cerebral amyloid with inflammation.  Mild subarachnoid hemorrhage.   Right temporal subdural hemorrhage.  Micro Data:   SARS-CoV-2 negative  Blood cultures pending 6/22, no growth to date  Urine culture multiple species.   Today's assessment: S: Doing better today, no complaints.  Daughter at bedside. O: Vitals:  Vitals:   01/23/20 0545 01/23/20 1340  BP: (!) 142/69 (!) 142/69  Pulse: 67 85  Resp: 19 17  Temp: 97.6 F (36.4 C) (!) 97.4 F (36.3 C)  SpO2: 100% 100%    Constitutional:  . Appears calm and comfortable Respiratory:  . CTA bilaterally, no w/r/r.  . Respiratory effort normal.  Cardiovascular:  . RRR, no m/r/g . Decreased, 1+ bilateral LE extremity edema   Skin:  . Right lower extremity warmth, erythema resolved.  Right dorsum wound appears healed. Psychiatric:  . Mental status o Mood, affect appropriate o Oriented to self, location, month, year, reason for hospitalization  No new data  Discharge Instructions  Discharge Instructions    Ambulatory referral to Neurology   Complete by: As directed    An appointment is requested in approximately: 2 weeks   Diet - low sodium heart healthy   Complete by: As directed    Discharge instructions   Complete by: As directed    Call physician or seek immediate medical attention for confusion, fever, lack of eating, vomiting or worsening of condition.   Discharge wound care:   Complete by: As directed    Wound care  Daily      Comments: Right foot superficial dorsal wound. Antibiotic ointment, cover with gauze and bandage.   Increase activity slowly   Complete by: As directed      Allergies as of 01/23/2020      Reactions   Penicillins Hives   Has patient had a PCN reaction causing immediate rash, facial/tongue/throat swelling, SOB or lightheadedness with hypotension: No Has patient had a PCN reaction causing severe rash involving mucus membranes or skin necrosis: No Has patient had a PCN reaction that required hospitalization No Has patient had a PCN reaction occurring  within the last 10 years: No If all of the above answers are "NO", then may proceed with Cephalosporin use.   Tetanus Toxoid Hives      Medication List    STOP taking these medications   aspirin 81 MG EC tablet   polyethylene glycol 17 g packet Commonly known as: MIRALAX / GLYCOLAX     TAKE these medications   acetaminophen 500 MG tablet Commonly known as: TYLENOL Take 1 tablet (500 mg total) by mouth at bedtime.   Artificial Tears 1 % ophthalmic solution Generic drug: carboxymethylcellulose Place 1 drop into both eyes 3 (three) times daily as needed (for lubercating eyes).   calcium carbonate 500 MG chewable tablet Commonly known as: Tums Chew 1 tablet (200 mg of elemental calcium total) by mouth daily.   cephALEXin 500 MG capsule Commonly known as: KEFLEX Take 1 capsule (500 mg total) by mouth 2 (two) times daily for 2 days.   [employer] Vitamin C 500 MG tablet Generic drug: ascorbic acid Take 1 tablet (500 mg total) by mouth daily.   docusate sodium 50 MG capsule Commonly known as: COLACE Take 1-2 capsules (50-100 mg total) by mouth daily as needed for mild constipation.   Ensure Max Protein Liqd Take 330 mLs (11 oz total) by mouth daily. Start taking on: January 24, 2020   furosemide 20 MG tablet Commonly known as: LASIX Take 1 tablet (20 mg total) by mouth every Monday, Wednesday, and Friday. What changed: when to take this   gabapentin 100 MG capsule Commonly known as: NEURONTIN Take 1-2 capsules (100-200 mg total) by mouth See admin instructions. Take 100mg  in AM and 200mg  at bedtime What changed:   how much to  take  how to take this  when to take this  additional instructions  Another medication with the same name was removed. Continue taking this medication, and follow the directions you see here.   Garlic 2703 MG Caps Take 2 capsules (2,000 mg total) by mouth daily. Take 2 capsules to = 2000 mg   iron polysaccharides 150 MG capsule Commonly known as:  NIFEREX Take 150 mg by mouth daily.   losartan 50 MG tablet Commonly known as: COZAAR Take 50 mg by mouth daily.   NON FORMULARY Diet Order: Regular NAS, Heart Healthy Diet, thin liquids.   pantoprazole 40 MG tablet Commonly known as: Protonix Take 1 tablet (40 mg total) by mouth daily.   predniSONE 20 MG tablet Commonly known as: DELTASONE Take 2 tablets (40 mg total) by mouth daily with breakfast. Start taking on: January 24, 2020   spironolactone 25 MG tablet Commonly known as: ALDACTONE Take 25 mg by mouth daily.   tetrahydrozoline 0.05 % ophthalmic solution Place 1 drop into both eyes daily as needed (eye irritation).   trolamine salicylate 10 % cream Commonly known as: ASPERCREME Apply 1 application topically as needed for muscle pain.   vitamin B-12 1000 MCG tablet Commonly known as: CYANOCOBALAMIN Take 1 tablet (1,000 mcg total) by mouth daily.            Discharge Care Instructions  (From admission, onward)         Start     Ordered   01/23/20 0000  Discharge wound care:       Comments: Wound care  Daily      Comments: Right foot superficial dorsal wound. Antibiotic ointment, cover with gauze and bandage.   01/23/20 1612         Allergies  Allergen Reactions  . Penicillins Hives    Has patient had a PCN reaction causing immediate rash, facial/tongue/throat swelling, SOB or lightheadedness with hypotension: No Has patient had a PCN reaction causing severe rash involving mucus membranes or skin necrosis: No Has patient had a PCN reaction that required hospitalization No Has patient had a PCN reaction occurring within the last 10 years: No If all of the above answers are "NO", then may proceed with Cephalosporin use.  . Tetanus Toxoid Hives    The results of significant diagnostics from this hospitalization (including imaging, microbiology, ancillary and laboratory) are listed below for reference.    Significant Diagnostic Studies: EEG  Result  Date: 01/22/2020 Lora Havens, MD     01/22/2020  1:36 PM Patient Name: [name] MRN: [id number] Epilepsy Attending: Lora Havens Referring Physician/Provider: Dr Ileene Musa Date: 01/22/2020 Duration: 24.54 minutes Patient history: 86 year old female with cerebral amyloid angiopathy presented with altered mental status.  EEG evaluate for seizures. Level of alertness: Awake, drowsy, sleep, comatose, lethargic AEDs during EEG study: None Technical aspects: This EEG study was done with scalp electrodes positioned according to the 10-20 International system of electrode placement. Electrical activity was acquired at a sampling rate of 500Hz  and reviewed with a high frequency filter of 70Hz  and a low frequency filter of 1Hz . EEG data were recorded continuously and digitally stored. Description: The posterior dominant rhythm consists of 8Hz  activity of moderate voltage (25-35 uV) seen predominantly in posterior head regions, symmetric and reactive to eye opening and eye closing. EEG showed intermittent generalized 3 to 6 Hz theta-delta slowing. Triphasic waves, generalized and maximal bifrontal were also seen. Hyperventilation and photic stimulation were not performed.  ABNORMALITY -Intermittent slow, generalized -Triphasic waves, generalized IMPRESSION: This study is suggestive of mild diffuse encephalopathy, nonspecific etiology but could be related to toxic-metabolic etiology. No seizures or definite epileptiform discharges were seen throughout the recording. Lora Havens   CT HEAD WO CONTRAST  Result Date: 01/20/2020 CLINICAL DATA:  Altered mental status EXAM: CT HEAD WITHOUT CONTRAST TECHNIQUE: Contiguous axial images were obtained from the base of the skull through the vertex without intravenous contrast. COMPARISON:  MRI 11/22/2019, CT brain 10/28/2016 FINDINGS: Brain: White matter hypodensity/edema within the right parietal and temporal lobes. No significant mass effect. No midline shift.  Questionable subtle hyperdensity at right frontal sulcus. Stable ventricle size. Vascular: No hyperdense vessels.  Carotid vascular calcification Skull: Normal. Negative for fracture or focal lesion. Sinuses/Orbits: No acute finding. Other: None IMPRESSION: 1. Similar pattern of white matter hypodensity/edema within the right parietal and temporal lobes as compared with MRI from April 2021 2. Faint hyperdensity at the cortical surface of the right frontal lobe is questionable for a small amount of subarachnoid blood. Electronically Signed   By: Donavan Foil M.D.   On: 01/20/2020 23:11   MR BRAIN WO CONTRAST  Result Date: 01/21/2020 CLINICAL DATA:  Altered mental status.  Encephalopathy. EXAM: MRI HEAD WITHOUT CONTRAST TECHNIQUE: Multiplanar, multiecho pulse sequences of the brain and surrounding structures were obtained without intravenous contrast. COMPARISON:  CT head 01/20/2020. MRI head 11/22/2019, 11/19/2019, 11/18/2019. FINDINGS: Brain: Rapid sequence scanning performed. The patient was not able to complete the study. No postcontrast images obtained. There was IV infiltration. Patient refused further imaging. White matter hyperintensity in the right temporal lobe has progressed since prior MRI studies. White matter hyperintensity in the posterior parietal lobe is stable. 15 mm hyperintensity in the right frontal convexity white matter has progressed Hyperintensity in the left anterior occipital lobe has progressed since the prior study. High signal in the right frontal sulcus on FLAIR suggestive of recent hemorrhage is new since prior MRI. Small subdural fluid collection lateral the right temporal lobe may also represent small area of subdural hemorrhage. In addition, there are scattered areas of microhemorrhage in the posterior parietal lobe bilaterally and in the right temporal lobe unchanged. Vascular: Normal arterial flow voids. Skull and upper cervical spine: Negative Sinuses/Orbits: Mild mucosal  edema left maxillary sinus. Bilateral cataract extraction. Other: None IMPRESSION: 1. Multiple areas of white matter edema with progression in the right temporal lobe, right high frontal lobe, and left anterior occipital lobe. Right posterior parietal lobe lesion appears similar in size. There is associated microhemorrhage with these lesions. These lesions did not enhance on prior studies. Favor cerebral amyloid with inflammation. 2. Interval development of mild subarachnoid hemorrhage on the right as well as right temporal subdural hemorrhage. Electronically Signed   By: Franchot Gallo M.D.   On: 01/21/2020 10:12   DG Chest Port 1 View  Result Date: 01/20/2020 CLINICAL DATA:  Confusion.  Altered mental status. EXAM: PORTABLE CHEST 1 VIEW COMPARISON:  Chest x-ray dated 11/26/2019 and CT angiogram of the chest dated 11/19/2018 FINDINGS: Heart size is normal. There is marked enlargement of the pulmonary arteries bilaterally, increased since the prior study. Aortic atherosclerosis. No discrete infiltrates or effusions. No acute bone abnormality. IMPRESSION: Marked enlargement of the pulmonary arteries bilaterally, increased since the prior study. Findings are consistent with marked pulmonary arterial hypertension. Aortic Atherosclerosis (ICD10-I70.0). Electronically Signed   By: Lorriane Shire M.D.   On: 01/20/2020 17:32    Microbiology: Recent Results (from the past 240 hour(s))  Blood culture (routine x 2)     Status: None (Preliminary result)   Collection Time: 01/20/20  3:40 PM   Specimen: BLOOD LEFT ARM  Result Value Ref Range Status   Specimen Description   Final    BLOOD LEFT ARM Performed at [facility], [address] [place], [place] [address]    Special Requests   Final    BOTTLES DRAWN AEROBIC AND ANAEROBIC Blood Culture adequate volume Performed at [place] [address] [place], [place] [address]    Culture   Final    NO GROWTH 3 DAYS Performed at  [facility], [place] [address] [place], [place] [address]    Report Status PENDING  Incomplete  Urine culture     Status: Abnormal   Collection Time: 01/20/20  4:57 PM   Specimen: Urine, Clean Catch  Result Value Ref Range Status   Specimen Description   Final    URINE, CLEAN CATCH Performed at [facility], [place] [address] [place], [place] [address]    Special Requests   Final    NONE Performed at [facility], [place] [address] [place], [place] [address]    Culture MULTIPLE SPECIES PRESENT, SUGGEST RECOLLECTION (A)  Final   Report Status 01/21/2020 FINAL  Final  SARS Coronavirus 2 by RT PCR (hospital order, performed in [facility] hospital lab) Nasopharyngeal Nasopharyngeal Swab     Status: None   Collection Time: 01/20/20  5:59 PM   Specimen: Nasopharyngeal Swab  Result Value Ref Range Status   SARS Coronavirus 2 NEGATIVE NEGATIVE Final    Comment: (NOTE) SARS-CoV-2 target nucleic acids are NOT DETECTED.  The SARS-CoV-2 RNA is generally detectable in upper and lower respiratory specimens during the acute phase of infection. The lowest concentration of SARS-CoV-2 viral copies this assay can detect is 250 copies / mL. A negative result does not preclude SARS-CoV-2 infection and should not be used as the sole basis for treatment or other patient management decisions.  A negative result may occur with improper specimen collection / handling, submission of specimen other than nasopharyngeal swab, presence of viral mutation(s) within the areas targeted by this assay, and inadequate number of viral copies (<250 copies / mL). A negative result must be combined with clinical observations, patient history, and epidemiological information.  Fact Sheet for Patients:   StrictlyIdeas.no  Fact Sheet for Healthcare Providers: BankingDealers.co.za  This test is not yet approved or  cleared by the  [place] FDA and has been authorized for detection and/or diagnosis of SARS-CoV-2 by FDA under an Emergency Use Authorization (EUA).  This EUA will remain in effect (meaning this test can be used) for the duration of the COVID-19 declaration under Section 564(b)(1) of the Act, 21 U.S.C. section 360bbb-3(b)(1), unless the authorization is terminated or revoked sooner.  Performed at [facility], [place] [address], [place], [place] [address]      Labs: Basic Metabolic Panel: Recent Labs  Lab 01/20/20 1339 01/21/20 0459  NA 138 138  K 3.4* 3.4*  CL 96* 99  CO2 30 30  GLUCOSE 141* 116*  BUN 18 15  CREATININE 1.05* 0.83  CALCIUM 9.2 8.7*   Liver Function Tests: Recent Labs  Lab 01/20/20 1339  AST 25  ALT 14  ALKPHOS 76  BILITOT 1.2  PROT 7.4  ALBUMIN 3.5    Recent Labs  Lab 01/20/20 1340  AMMONIA 15   CBC: Recent Labs  Lab 01/20/20 1339 01/21/20 0459  WBC 6.7 4.5  NEUTROABS 4.7  --   HGB 10.5* 9.8*  HCT 34.2* 31.9*  MCV 83.2 81.8  PLT 260 240    Principal Problem:   Cellulitis of right leg Active Problems:   Hypertension   Chronic kidney disease, stage III (moderate)   Acute encephalopathy   PVD (peripheral vascular disease) (HCC)   Cerebral amyloid angiopathy (HCC)   AMS (altered mental status)   Dementia without behavioral disturbance ([place])   Time coordinating discharge: 35 minutes  Signed:  Murray Hodgkins, MD  [facility]  01/23/2020, 4:16 PM

## 2020-01-23 NOTE — Care Management Important Message (Signed)
Important Message  Patient Details IM Letter given to [place]  Case Manager to present to the Patient Name: [name] MRN: [id number] Date of Birth: 1930/05/18   Medicare Important Message Given:  Yes     Kerin Salen 01/23/2020, 12:22 PM

## 2020-01-23 NOTE — Progress Notes (Signed)
Reason for consult: Amyloid angiitis  Subjective: Patient no longer having hallucinations according to daughter and has improved.  She is alert oriented x3, can name the president vice president and follows commands.   ROS: negative except above  Examination  Vital signs in last 24 hours: Temp:  [97.4 F (36.3 C)-98.3 F (36.8 C)] 97.4 F (36.3 C) (06/25 1340) Pulse Rate:  [67-85] 85 (06/25 1340) Resp:  [17-19] 17 (06/25 1340) BP: (142)/(69-94) 142/69 (06/25 1340) SpO2:  [100 %] 100 % (06/25 1340)  General: lying in bed CVS: pulse-normal rate and rhythm RS: breathing comfortably Extremities: normal   Neuro: MS: Alert, oriented, follows commands CN: pupils equal and reactive,  EOMI, blind in the left eye and inferior quadrantanopsia in the left, face symmetric, tongue midline, normal sensation over face, Motor: 4/5 strength in all 4 extremities Sensory: Symmetric bilaterally Coordination: normal Gait: not tested  Basic Metabolic Panel: Recent Labs  Lab 01/20/20 1339 01/21/20 0459  NA 138 138  K 3.4* 3.4*  CL 96* 99  CO2 30 30  GLUCOSE 141* 116*  BUN 18 15  CREATININE 1.05* 0.83  CALCIUM 9.2 8.7*    CBC: Recent Labs  Lab 01/20/20 1339 01/21/20 0459  WBC 6.7 4.5  NEUTROABS 4.7  --   HGB 10.5* 9.8*  HCT 34.2* 31.9*  MCV 83.2 81.8  PLT 260 240     Coagulation Studies: Recent Labs    01/20/20 1549  LABPROT 14.0  INR 1.1    Imaging Reviewed:     ASSESSMENT AND PLAN  86 year old female with previously diagnosed cerebral amyloid angiopathy with radiographic progression and presentation consistent with delirium.  Patient has received 3 days of IV Solu-Medrol and has improved.  Alert oriented x3, no longer having hallucinations.  At times gets confused intermittently-but that also has been improving.  Continues to have visual field cut in the right eye.   Recommendations: 1)  continue oral prednisone 40mg  daily on discharge, start patient on  calcium supplement and Protonix 2) PT /OT 3) follow-up in 2 weeks with Dr. Leonie Man to consider for steroid sparing therapy/possible Cytoxan or Rituxan    Lavayah Vita [facility] Pager Number [phone] For questions after 7pm please refer to AMION to reach the Neurologist on call

## 2020-01-23 NOTE — TOC Initial Note (Signed)
Transition of Care [facility]) - Initial/Assessment Note    Patient Details  Name: [name] MRN: [id number] Date of Birth: Apr 01, 1930  Transition of Care [facility]) CM/SW Contact:    Lia Hopping, Corinne Phone Number: 01/23/2020, 5:57 PM  Clinical Narrative:                 CSW met with the patient and her daughter at bedside. Patient allowed her daughter to converse with CSW about her care at home. Daughter reports she lives in the home with the patient and helps to care for her. She reports the patient can usually bathe and dress herself. When she is unable, or too tired to do so the patient daughter assist her. She reports the patient has been active with [facility] since returning home from SNF rehab PT/OT see the patient 2-3 times a week. Daughter reports the patient is ambulatory with a rolling walker. She also has a rolling walker with a set and wheelchair. Daughter reports the patient granddaughters also help care for the patient. She is hoping that [facility] will hire more nurse aides to assist the patient.  Home Health ordered for discharge.  CSW unable to reach anyone at [facility].  TOC will attempt to call in the am,to notify them of resumption of care.   Expected Discharge Plan: [place] Barriers to Discharge: No Barriers Identified   Patient Goals and CMS Choice Patient states their goals for this hospitalization and ongoing recovery are:: Per daughter-Return home   Choice offered to / list presented to : NA (Active with [facility])  Expected Discharge Plan and Services Expected Discharge Plan: [place] In-house Referral: Clinical Social Work Discharge Planning Services: CM Consult Post Acute Care Choice: [place] arrangements for the past 2 months: Single Family Home Expected Discharge Date: 01/23/20               DME Arranged: N/A         HH Arranged: PT, OT, RN [place] Agency: [place]        Prior Living Arrangements/Services Living arrangements for the past 2 months: Single Family Home Lives with:: Adult Children Patient language and need for interpreter reviewed:: No Do you feel safe going back to the place where you live?: Yes      Need for Family Participation in Patient Care: Yes (Comment) Care giver support system in place?: Yes (comment) Current home services: DME, Home PT, Home OT Criminal Activity/Legal Involvement Pertinent to Current Situation/Hospitalization: No - Comment as needed  Activities of Daily Living Home Assistive Devices/Equipment: Cane (specify quad or straight), Eyeglasses, Walker (specify type), Wheelchair (reading glasses) ADL Screening (condition at time of admission) Patient's cognitive ability adequate to safely complete daily activities?: No Is the patient deaf or have difficulty hearing?: Yes Does the patient have difficulty seeing, even when wearing glasses/contacts?: Yes (no vision in left eye) Does the patient have difficulty concentrating, remembering, or making decisions?: Yes Patient able to express need for assistance with ADLs?: Yes Does the patient have difficulty dressing or bathing?: Yes Independently performs ADLs?: No Communication: Independent Dressing (OT): Needs assistance Is this a change from baseline?: Change from baseline, expected to last >3 days Grooming: Needs assistance Is this a change from baseline?: Change from baseline, expected to last >3 days Feeding: Needs assistance Is this a change from baseline?: Change from baseline, expected to last >3 days Bathing: Needs assistance  Is this a change from baseline?: Change from baseline, expected to last >3 days Toileting: Needs assistance Is this a change from baseline?: Change from baseline, expected to last >3days In/Out Bed: Needs assistance Is this a change from baseline?: Change from baseline, expected to last >3 days Walks in Home:  Dependent Is this a change from baseline?: Change from baseline, expected to last >3 days Does the patient have difficulty walking or climbing stairs?: Yes Weakness of Legs: Both Weakness of Arms/Hands: Both  Permission Sought/Granted Permission sought to share information with : Case Manager    Share Information with NAME: Freddi Che Daughter [phone]  Permission granted to share info w AGENCY: [place] granted to share info w Relationship: Daughter     Emotional Assessment Appearance:: Appears stated age Attitude/Demeanor/Rapport: Gracious Affect (typically observed): Accepting Orientation: : Oriented to Self, Oriented to Place, Oriented to Situation Alcohol / Substance Use: Not Applicable Psych Involvement: No (comment)  Admission diagnosis:  Delirium [R41.0] AMS (altered mental status) [R41.82] Patient Active Problem List   Diagnosis Date Noted  . Cellulitis of right leg 01/21/2020  . Dementia without behavioral disturbance ([place]) 01/21/2020  . AMS (altered mental status) 01/20/2020  . Abnormal finding on MRI of brain 12/04/2019  . Confusion 12/04/2019  . Cerebral amyloid angiopathy ([place]) 12/03/2019  . Hypoalbuminemia 11/29/2019  . Anasarca 11/29/2019  . PVD (peripheral vascular disease) ([place]) 12/19/2018  . Mitral valve insufficiency 03/19/2017  . Other spondylosis with radiculopathy, cervical region 03/14/2017  . Cerumen impaction 12/04/2016  . Generalized weakness 09/22/2015  . Acidosis 09/22/2015  . Acute encephalopathy 09/22/2015  . Hyperglycemia 09/22/2015  . Leukopenia 09/22/2015  . CAP (community acquired pneumonia) 09/22/2015  . Bilateral leg edema 09/22/2015  . Constipation 09/22/2015  . Snoring 09/13/2015  . Glaucoma (increased eye pressure)   . Hypertension   . Neck pain   . Head pain   . Pain in joint, multiple sites   . Impaired glucose tolerance test   . Osteoarthrosis, unspecified whether generalized or  localized, other specified sites   . Morbid obesity ([place])   . Medication management   . Other lymphedema   . Insomnia, unspecified   . Chronic kidney disease, stage III (moderate)   . Primary open-angle glaucoma(365.11)    PCP:  Audley Hose, MD Pharmacy:   [id number] [id number] [id number]: [phone]: [phone]    Social Determinants of Health (SDOH) Interventions    Readmission Risk Interventions No flowsheet data found.

## 2020-01-23 NOTE — Progress Notes (Signed)
Patient and family member denied wanting to move to a low bed.

## 2020-01-24 NOTE — Plan of Care (Signed)
Patient and caregiver were given discharge instructions. All questions were answered and patient was taken to main entrance via wheelchair.

## 2020-01-24 NOTE — TOC Progression Note (Signed)
Transition of Care [facility]) - Progression Note    Patient Details  Name: [name] MRN: [id number] Date of Birth: [birth year]  [facility] CM/SW Contact  Joaquin Courts, RN Phone Number: 01/24/2020, 8:51 AM  Clinical Narrative:    CM spoke with [facility] rep and notified of planned discharge and need to resume [facility] services.    Expected Discharge Plan: [place] Barriers to Discharge: No Barriers Identified  Expected Discharge Plan and Services Expected Discharge Plan: [place] In-house Referral: Clinical Social Work Discharge Planning Services: CM Consult Post Acute Care Choice: [place] arrangements for the past 2 months: Single Family Home Expected Discharge Date: 01/23/20               DME Arranged: N/A         HH Arranged: PT, OT, RN [place] Agency: [place]         Social Determinants of Health (SDOH) Interventions    Readmission Risk Interventions No flowsheet data found.

## 2020-01-25 LAB — CULTURE, BLOOD (ROUTINE X 2)
Culture: NO GROWTH
Special Requests: ADEQUATE

## 2020-01-26 ENCOUNTER — Other Ambulatory Visit: Payer: Medicare Other

## 2020-01-26 DIAGNOSIS — G9341 Metabolic encephalopathy: Secondary | ICD-10-CM | POA: Diagnosis not present

## 2020-01-26 DIAGNOSIS — Z7982 Long term (current) use of aspirin: Secondary | ICD-10-CM | POA: Diagnosis not present

## 2020-01-26 DIAGNOSIS — I700 Atherosclerosis of aorta: Secondary | ICD-10-CM | POA: Diagnosis not present

## 2020-01-26 DIAGNOSIS — I129 Hypertensive chronic kidney disease with stage 1 through stage 4 chronic kidney disease, or unspecified chronic kidney disease: Secondary | ICD-10-CM | POA: Diagnosis not present

## 2020-01-26 DIAGNOSIS — I739 Peripheral vascular disease, unspecified: Secondary | ICD-10-CM | POA: Diagnosis not present

## 2020-01-26 DIAGNOSIS — H4010X0 Unspecified open-angle glaucoma, stage unspecified: Secondary | ICD-10-CM | POA: Diagnosis not present

## 2020-01-26 DIAGNOSIS — M4722 Other spondylosis with radiculopathy, cervical region: Secondary | ICD-10-CM | POA: Diagnosis not present

## 2020-01-26 DIAGNOSIS — M1990 Unspecified osteoarthritis, unspecified site: Secondary | ICD-10-CM | POA: Diagnosis not present

## 2020-01-26 DIAGNOSIS — N183 Chronic kidney disease, stage 3 unspecified: Secondary | ICD-10-CM | POA: Diagnosis not present

## 2020-01-26 DIAGNOSIS — I890 Lymphedema, not elsewhere classified: Secondary | ICD-10-CM | POA: Diagnosis not present

## 2020-01-26 DIAGNOSIS — I340 Nonrheumatic mitral (valve) insufficiency: Secondary | ICD-10-CM | POA: Diagnosis not present

## 2020-01-27 DIAGNOSIS — I700 Atherosclerosis of aorta: Secondary | ICD-10-CM | POA: Diagnosis not present

## 2020-01-27 DIAGNOSIS — I129 Hypertensive chronic kidney disease with stage 1 through stage 4 chronic kidney disease, or unspecified chronic kidney disease: Secondary | ICD-10-CM | POA: Diagnosis not present

## 2020-01-27 DIAGNOSIS — H4010X0 Unspecified open-angle glaucoma, stage unspecified: Secondary | ICD-10-CM | POA: Diagnosis not present

## 2020-01-27 DIAGNOSIS — M4722 Other spondylosis with radiculopathy, cervical region: Secondary | ICD-10-CM | POA: Diagnosis not present

## 2020-01-27 DIAGNOSIS — I739 Peripheral vascular disease, unspecified: Secondary | ICD-10-CM | POA: Diagnosis not present

## 2020-01-27 DIAGNOSIS — G9341 Metabolic encephalopathy: Secondary | ICD-10-CM | POA: Diagnosis not present

## 2020-01-27 DIAGNOSIS — I340 Nonrheumatic mitral (valve) insufficiency: Secondary | ICD-10-CM | POA: Diagnosis not present

## 2020-01-27 DIAGNOSIS — N183 Chronic kidney disease, stage 3 unspecified: Secondary | ICD-10-CM | POA: Diagnosis not present

## 2020-01-27 DIAGNOSIS — I890 Lymphedema, not elsewhere classified: Secondary | ICD-10-CM | POA: Diagnosis not present

## 2020-01-27 DIAGNOSIS — M1990 Unspecified osteoarthritis, unspecified site: Secondary | ICD-10-CM | POA: Diagnosis not present

## 2020-01-27 DIAGNOSIS — Z7982 Long term (current) use of aspirin: Secondary | ICD-10-CM | POA: Diagnosis not present

## 2020-01-28 ENCOUNTER — Encounter (HOSPITAL_COMMUNITY): Payer: Self-pay | Admitting: Emergency Medicine

## 2020-01-28 ENCOUNTER — Emergency Department (HOSPITAL_COMMUNITY): Payer: Medicare Other

## 2020-01-28 ENCOUNTER — Observation Stay (HOSPITAL_COMMUNITY)
Admission: EM | Admit: 2020-01-28 | Discharge: 2020-01-30 | Disposition: A | Discharge status: Home / Self Care | Payer: Medicare Other | Attending: Internal Medicine | Admitting: Internal Medicine

## 2020-01-28 ENCOUNTER — Ambulatory Visit: Payer: Medicare Other | Admitting: Podiatry

## 2020-01-28 DIAGNOSIS — Z8679 Personal history of other diseases of the circulatory system: Secondary | ICD-10-CM | POA: Diagnosis not present

## 2020-01-28 DIAGNOSIS — K5900 Constipation, unspecified: Secondary | ICD-10-CM | POA: Diagnosis not present

## 2020-01-28 DIAGNOSIS — Z6839 Body mass index (BMI) 39.0-39.9, adult: Secondary | ICD-10-CM | POA: Diagnosis not present

## 2020-01-28 DIAGNOSIS — Z880 Allergy status to penicillin: Secondary | ICD-10-CM | POA: Insufficient documentation

## 2020-01-28 DIAGNOSIS — I680 Cerebral amyloid angiopathy: Secondary | ICD-10-CM | POA: Insufficient documentation

## 2020-01-28 DIAGNOSIS — I083 Combined rheumatic disorders of mitral, aortic and tricuspid valves: Secondary | ICD-10-CM | POA: Insufficient documentation

## 2020-01-28 DIAGNOSIS — Z887 Allergy status to serum and vaccine status: Secondary | ICD-10-CM | POA: Diagnosis not present

## 2020-01-28 DIAGNOSIS — F0390 Unspecified dementia without behavioral disturbance: Secondary | ICD-10-CM | POA: Insufficient documentation

## 2020-01-28 DIAGNOSIS — R0789 Other chest pain: Secondary | ICD-10-CM | POA: Diagnosis not present

## 2020-01-28 DIAGNOSIS — M4722 Other spondylosis with radiculopathy, cervical region: Secondary | ICD-10-CM | POA: Diagnosis not present

## 2020-01-28 DIAGNOSIS — Z7952 Long term (current) use of systemic steroids: Secondary | ICD-10-CM | POA: Insufficient documentation

## 2020-01-28 DIAGNOSIS — R1013 Epigastric pain: Secondary | ICD-10-CM | POA: Diagnosis not present

## 2020-01-28 DIAGNOSIS — N183 Chronic kidney disease, stage 3 unspecified: Secondary | ICD-10-CM | POA: Diagnosis not present

## 2020-01-28 DIAGNOSIS — Z79899 Other long term (current) drug therapy: Secondary | ICD-10-CM | POA: Insufficient documentation

## 2020-01-28 DIAGNOSIS — R079 Chest pain, unspecified: Secondary | ICD-10-CM | POA: Diagnosis not present

## 2020-01-28 DIAGNOSIS — I1310 Hypertensive heart and chronic kidney disease without heart failure, with stage 1 through stage 4 chronic kidney disease, or unspecified chronic kidney disease: Secondary | ICD-10-CM | POA: Diagnosis not present

## 2020-01-28 DIAGNOSIS — E854 Organ-limited amyloidosis: Secondary | ICD-10-CM | POA: Insufficient documentation

## 2020-01-28 DIAGNOSIS — I499 Cardiac arrhythmia, unspecified: Secondary | ICD-10-CM | POA: Diagnosis not present

## 2020-01-28 DIAGNOSIS — M1990 Unspecified osteoarthritis, unspecified site: Secondary | ICD-10-CM | POA: Insufficient documentation

## 2020-01-28 DIAGNOSIS — H409 Unspecified glaucoma: Secondary | ICD-10-CM | POA: Diagnosis not present

## 2020-01-28 DIAGNOSIS — Z743 Need for continuous supervision: Secondary | ICD-10-CM | POA: Diagnosis not present

## 2020-01-28 DIAGNOSIS — I10 Essential (primary) hypertension: Secondary | ICD-10-CM | POA: Diagnosis not present

## 2020-01-28 DIAGNOSIS — Z20822 Contact with and (suspected) exposure to covid-19: Secondary | ICD-10-CM | POA: Insufficient documentation

## 2020-01-28 DIAGNOSIS — I4891 Unspecified atrial fibrillation: Secondary | ICD-10-CM | POA: Diagnosis present

## 2020-01-28 DIAGNOSIS — I517 Cardiomegaly: Secondary | ICD-10-CM | POA: Diagnosis not present

## 2020-01-28 DIAGNOSIS — R000 Tachycardia, unspecified: Secondary | ICD-10-CM | POA: Diagnosis not present

## 2020-01-28 DIAGNOSIS — I480 Paroxysmal atrial fibrillation: Principal | ICD-10-CM | POA: Insufficient documentation

## 2020-01-28 DIAGNOSIS — Z03818 Encounter for observation for suspected exposure to other biological agents ruled out: Secondary | ICD-10-CM | POA: Diagnosis not present

## 2020-01-28 DIAGNOSIS — J811 Chronic pulmonary edema: Secondary | ICD-10-CM | POA: Diagnosis not present

## 2020-01-28 LAB — LIPASE, BLOOD: Lipase: 27 U/L (ref 11–51)

## 2020-01-28 LAB — BASIC METABOLIC PANEL
Anion gap: 9 (ref 5–15)
BUN: 38 mg/dL — ABNORMAL HIGH (ref 8–23)
CO2: 28 mmol/L (ref 22–32)
Calcium: 9 mg/dL (ref 8.9–10.3)
Chloride: 102 mmol/L (ref 98–111)
Creatinine, Ser: 1 mg/dL (ref 0.44–1.00)
GFR calc Af Amer: 59 mL/min — ABNORMAL LOW (ref >60)
GFR calc non Af Amer: 51 mL/min — ABNORMAL LOW (ref >60)
Glucose, Bld: 160 mg/dL — ABNORMAL HIGH (ref 70–99)
Potassium: 3.9 mmol/L (ref 3.5–5.1)
Sodium: 139 mmol/L (ref 135–145)

## 2020-01-28 LAB — CBC
HCT: 34.2 % — ABNORMAL LOW (ref 36.0–46.0)
Hemoglobin: 10.2 g/dL — ABNORMAL LOW (ref 12.0–15.0)
MCH: 24.3 pg — ABNORMAL LOW (ref 26.0–34.0)
MCHC: 29.8 g/dL — ABNORMAL LOW (ref 30.0–36.0)
MCV: 81.4 fL (ref 80.0–100.0)
Platelets: 318 10*3/uL (ref 150–400)
RBC: 4.2 MIL/uL (ref 3.87–5.11)
RDW: 18.1 % — ABNORMAL HIGH (ref 11.5–15.5)
WBC: 8.2 10*3/uL (ref 4.0–10.5)
nRBC: 0 % (ref 0.0–0.2)

## 2020-01-28 LAB — SARS CORONAVIRUS 2 BY RT PCR (HOSPITAL ORDER, PERFORMED IN ~~LOC~~ HOSPITAL LAB): SARS Coronavirus 2: NEGATIVE

## 2020-01-28 LAB — HEPATIC FUNCTION PANEL
ALT: 17 U/L (ref 0–44)
AST: 19 U/L (ref 15–41)
Albumin: 2.7 g/dL — ABNORMAL LOW (ref 3.5–5.0)
Alkaline Phosphatase: 63 U/L (ref 38–126)
Bilirubin, Direct: 0.2 mg/dL (ref 0.0–0.2)
Indirect Bilirubin: 0.4 mg/dL (ref 0.3–0.9)
Total Bilirubin: 0.6 mg/dL (ref 0.3–1.2)
Total Protein: 5.5 g/dL — ABNORMAL LOW (ref 6.5–8.1)

## 2020-01-28 LAB — BRAIN NATRIURETIC PEPTIDE: B Natriuretic Peptide: 375.5 pg/mL — ABNORMAL HIGH (ref 0.0–100.0)

## 2020-01-28 LAB — TROPONIN I (HIGH SENSITIVITY): Troponin I (High Sensitivity): 40 ng/L — ABNORMAL HIGH (ref <18)

## 2020-01-28 MED ORDER — VITAMIN B-12 1000 MCG PO TABS
1000.0000 ug | ORAL_TABLET | Freq: Every day | ORAL | Status: DC
  Administered 2020-01-29 – 2020-01-30 (×2): 1000 ug via ORAL
  Filled 2020-01-28 (×2): qty 1

## 2020-01-28 MED ORDER — CALCIUM CARBONATE ANTACID 500 MG PO CHEW
1.0000 | CHEWABLE_TABLET | Freq: Every day | ORAL | Status: DC
  Administered 2020-01-29 – 2020-01-30 (×2): 200 mg via ORAL
  Filled 2020-01-28 (×2): qty 1

## 2020-01-28 MED ORDER — DILTIAZEM LOAD VIA INFUSION
10.0000 mg | Freq: Once | INTRAVENOUS | Status: AC
  Administered 2020-01-28: 10 mg via INTRAVENOUS
  Filled 2020-01-28: qty 10

## 2020-01-28 MED ORDER — SPIRONOLACTONE 25 MG PO TABS
25.0000 mg | ORAL_TABLET | Freq: Every day | ORAL | Status: DC
  Administered 2020-01-29 – 2020-01-30 (×2): 25 mg via ORAL
  Filled 2020-01-28 (×3): qty 1

## 2020-01-28 MED ORDER — ONDANSETRON HCL 4 MG/2ML IJ SOLN: 4.0000 mg | Freq: Four times a day (QID) | INTRAMUSCULAR | Status: DC | PRN

## 2020-01-28 MED ORDER — POLYSACCHARIDE IRON COMPLEX 150 MG PO CAPS
150.0000 mg | ORAL_CAPSULE | Freq: Every day | ORAL | Status: DC
  Administered 2020-01-29 – 2020-01-30 (×2): 150 mg via ORAL
  Filled 2020-01-28 (×3): qty 1

## 2020-01-28 MED ORDER — CARBOXYMETHYLCELLULOSE SODIUM 1 % OP SOLN: 1.0000 [drp] | Freq: Three times a day (TID) | OPHTHALMIC | Status: DC | PRN

## 2020-01-28 MED ORDER — DILTIAZEM HCL-DEXTROSE 125-5 MG/125ML-% IV SOLN (PREMIX): 5.0000 mg/h | INTRAVENOUS | Status: DC

## 2020-01-28 MED ORDER — FUROSEMIDE 20 MG PO TABS
20.0000 mg | ORAL_TABLET | Freq: Every day | ORAL | Status: DC
  Administered 2020-01-29 – 2020-01-30 (×2): 20 mg via ORAL
  Filled 2020-01-28 (×2): qty 1

## 2020-01-28 MED ORDER — DOCUSATE SODIUM 100 MG PO CAPS: 100.0000 mg | ORAL_CAPSULE | Freq: Every day | ORAL | Status: DC | PRN

## 2020-01-28 MED ORDER — SODIUM CHLORIDE 0.9% FLUSH
3.0000 mL | Freq: Once | INTRAVENOUS | Status: AC
  Administered 2020-01-28: 3 mL via INTRAVENOUS

## 2020-01-28 MED ORDER — ACETAMINOPHEN 325 MG PO TABS: 650.0000 mg | ORAL_TABLET | ORAL | Status: DC | PRN

## 2020-01-28 MED ORDER — POLYVINYL ALCOHOL 1.4 % OP SOLN
1.0000 [drp] | OPHTHALMIC | Status: DC | PRN
  Filled 2020-01-28: qty 15

## 2020-01-28 MED ORDER — DILTIAZEM HCL-DEXTROSE 125-5 MG/125ML-% IV SOLN (PREMIX)
5.0000 mg/h | INTRAVENOUS | Status: DC
  Administered 2020-01-28: 5 mg/h via INTRAVENOUS
  Filled 2020-01-28: qty 125

## 2020-01-28 MED ORDER — GABAPENTIN 100 MG PO CAPS
200.0000 mg | ORAL_CAPSULE | Freq: Every day | ORAL | Status: DC
  Administered 2020-01-28 – 2020-01-29 (×2): 200 mg via ORAL
  Filled 2020-01-28 (×3): qty 2

## 2020-01-28 MED ORDER — GARLIC 1000 MG PO CAPS: 2000.0000 mg | ORAL_CAPSULE | Freq: Every day | ORAL | Status: DC

## 2020-01-28 MED ORDER — GABAPENTIN 100 MG PO CAPS: 100.0000 mg | ORAL_CAPSULE | ORAL | Status: DC

## 2020-01-28 MED ORDER — PANTOPRAZOLE SODIUM 40 MG PO TBEC
40.0000 mg | DELAYED_RELEASE_TABLET | Freq: Every day | ORAL | Status: DC
  Administered 2020-01-29 – 2020-01-30 (×2): 40 mg via ORAL
  Filled 2020-01-28 (×2): qty 1

## 2020-01-28 MED ORDER — GABAPENTIN 100 MG PO CAPS
100.0000 mg | ORAL_CAPSULE | Freq: Every day | ORAL | Status: DC
  Administered 2020-01-29 – 2020-01-30 (×2): 100 mg via ORAL
  Filled 2020-01-28 (×2): qty 1

## 2020-01-28 MED ORDER — LOSARTAN POTASSIUM 50 MG PO TABS
50.0000 mg | ORAL_TABLET | Freq: Every day | ORAL | Status: DC
  Administered 2020-01-29 – 2020-01-30 (×2): 50 mg via ORAL
  Filled 2020-01-28 (×2): qty 1

## 2020-01-28 NOTE — ED Triage Notes (Signed)
Patient sent to [facility] from PCP for afib with rapid rate, 130's. Patient alert and oriented at this time, hard of hearing.  Denies SOB, nausea, vomiting, diaphoresis. Arrives with 22g saline lock in left forearm.

## 2020-01-28 NOTE — ED Notes (Signed)
[place] provided.

## 2020-01-28 NOTE — ED Notes (Signed)
Called main lab to add on BNP, HFP, Lipase, Troponin

## 2020-01-28 NOTE — ED Provider Notes (Signed)
[facility] EMERGENCY DEPARTMENT Provider Note   CSN: [id number] Arrival date & time: 01/28/20  1656     History Chief Complaint  Patient presents with  . Atrial Fibrillation    [name] is a 86 y.o. female.  The history is provided by the patient and medical records. No language interpreter was used.  Atrial Fibrillation   [name] is a 86 y.o. female who presents to the Emergency Department complaining of atrial fibrillation. She is referred to the emergency department by her PCP's office for evaluation of new onset a fib. She went to her PCP today for abdominal pain that began earlier today. She states that she had pain around her waist that is now resolved. No prior similar symptoms. Per P[facility] with [facility] she was in the office for a post hospitalization visit. At PCPs office she reported epigastric pain without nausea or vomiting that started yesterday. On her their examination the office she had a fib with RVR with heart rate to 166. She was recently admitted for acute encephalopathy with visual and auditory hallucinations from 622 to 625. During that states she was found to have a spontaneous small subarachnoid and subdural hemorrhage that did not require intervention. Her aspirin was discontinued at the time of hospital discharge. She lives at home with her daughter. She denies any fevers, chest pain, shortness of breath, nausea, vomiting, diarrhea.    Past Medical History:  Diagnosis Date  . CAP (community acquired pneumonia) 09/21/2015  . Chronic kidney disease, stage III (moderate)   . Encounter for long-term (current) use of other medications   . Glaucoma (increased eye pressure)   . Hard of hearing   . Head pain   . Hypertension   . Impaired glucose tolerance test   . Insomnia, unspecified   . Memory loss   . Morbid obesity ([place])   . Neck pain   . Osteoarthrosis, unspecified whether generalized or localized, other  specified sites   . Other lymphedema   . Pain in joint, multiple sites   . Primary open-angle glaucoma(365.11)     Patient Active Problem List   Diagnosis Date Noted  . Atrial fibrillation with RVR ([place]) 01/28/2020  . New onset atrial fibrillation ([place]) 01/28/2020  . History of subdural hematoma, 01/21/20 01/28/2020  . History of subarachnoid hemorrhage, 01/21/20 01/28/2020  . Atrial fibrillation with rapid ventricular response ([place]) 01/28/2020  . Epigastric pain 01/28/2020  . Cellulitis of right leg 01/21/2020  . Dementia without behavioral disturbance ([place]) 01/21/2020  . AMS (altered mental status) 01/20/2020  . Abnormal finding on MRI of brain 12/04/2019  . Confusion 12/04/2019  . Cerebral amyloid angiopathy ([place]) 12/03/2019  . Hypoalbuminemia 11/29/2019  . Anasarca 11/29/2019  . PVD (peripheral vascular disease) ([place]) 12/19/2018  . Mitral valve insufficiency 03/19/2017  . Other spondylosis with radiculopathy, cervical region 03/14/2017  . Cerumen impaction 12/04/2016  . Generalized weakness 09/22/2015  . Acidosis 09/22/2015  . Acute encephalopathy 09/22/2015  . Hyperglycemia 09/22/2015  . Leukopenia 09/22/2015  . CAP (community acquired pneumonia) 09/22/2015  . Bilateral leg edema 09/22/2015  . Constipation 09/22/2015  . Snoring 09/13/2015  . Glaucoma (increased eye pressure)   . Hypertension   . Neck pain   . Head pain   . Pain in joint, multiple sites   . Impaired glucose tolerance test   . Osteoarthrosis, unspecified whether generalized or localized, other specified sites   . Morbid obesity ([place])   .  Medication management   . Other lymphedema   . Insomnia, unspecified   . Chronic kidney disease, stage III (moderate)   . Primary open-angle glaucoma(365.11)     Past Surgical History:  Procedure Laterality Date  . ABDOMINAL HYSTERECTOMY    . EYE SURGERY       OB History   No obstetric history on file.     Family History  Problem Relation Age of Onset    . Other Mother        "old age"  . Stomach cancer Father   . Stomach cancer Sister   . Aneurysm Brother     Social History   Tobacco Use  . Smoking status: Never Smoker  . Smokeless tobacco: Never Used  Substance Use Topics  . Alcohol use: No    Alcohol/week: 0.0 standard drinks  . Drug use: No    Home Medications Prior to Admission medications   Medication Sig Start Date End Date Taking? Authorizing Provider  acetaminophen (TYLENOL) 500 MG tablet Take 1 tablet (500 mg total) by mouth at bedtime. 12/16/19  Yes Oscar La, Arlo C, PA-C  calcium carbonate (TUMS) 500 MG chewable tablet Chew 1 tablet (200 mg of elemental calcium total) by mouth daily. 01/23/20 02/22/20 Yes Samuella Cota, MD  carboxymethylcellulose (ARTIFICIAL TEARS) 1 % ophthalmic solution Place 1 drop into both eyes 3 (three) times daily as needed (for lubercating eyes). 12/16/19  Yes Lassen, Arlo C, PA-C  [facility] VITAMIN C 500 MG tablet Take 1 tablet (500 mg total) by mouth daily. 12/16/19  Yes Lassen, Arlo C, PA-C  docusate sodium (COLACE) 50 MG capsule Take 1-2 capsules (50-100 mg total) by mouth daily as needed for mild constipation. 01/23/20  Yes Samuella Cota, MD  Ensure Max Protein (ENSURE MAX PROTEIN) LIQD Take 330 mLs (11 oz total) by mouth daily. 01/24/20  Yes Samuella Cota, MD  furosemide (LASIX) 20 MG tablet Take 1 tablet (20 mg total) by mouth every Monday, Wednesday, and Friday. Patient taking differently: Take 20 mg by mouth daily.  12/17/19  Yes Lassen, Arlo C, PA-C  gabapentin (NEURONTIN) 100 MG capsule Take 1-2 capsules (100-200 mg total) by mouth See admin instructions. Take 100mg  in AM and 200mg  at bedtime 01/23/20  Yes Samuella Cota, MD  Garlic 9528 MG CAPS Take 2 capsules (2,000 mg total) by mouth daily. Take 2 capsules to = 2000 mg 12/16/19  Yes Lassen, Arlo C, PA-C  iron polysaccharides (NIFEREX) 150 MG capsule Take 150 mg by mouth daily.   Yes [provider]  losartan (COZAAR) 50 MG  tablet Take 50 mg by mouth daily.   Yes [provider]  pantoprazole (PROTONIX) 40 MG tablet Take 1 tablet (40 mg total) by mouth daily. 01/23/20 03/23/20 Yes Samuella Cota, MD  predniSONE (DELTASONE) 20 MG tablet Take 2 tablets (40 mg total) by mouth daily with breakfast. 01/24/20  Yes Samuella Cota, MD  spironolactone (ALDACTONE) 25 MG tablet Take 25 mg by mouth daily.   Yes [provider]  tetrahydrozoline 0.05 % ophthalmic solution Place 1 drop into both eyes daily as needed (eye irritation). 12/16/19  Yes Lassen, Arlo C, PA-C  trolamine salicylate (ASPERCREME) 10 % cream Apply 1 application topically as needed for muscle pain.   Yes [provider]  vitamin B-12 (CYANOCOBALAMIN) 1000 MCG tablet Take 1 tablet (1,000 mcg total) by mouth daily. 12/16/19  Yes Wille Celeste, PA-C  NON FORMULARY Diet Order: Regular NAS, Heart Healthy  Diet, thin liquids.    [provider]    Allergies    Penicillins and Tetanus toxoid  Review of Systems   Review of Systems  All other systems reviewed and are negative.   Physical Exam Updated Vital Signs BP 124/69   Pulse 79   Temp 98.1 F (36.7 C) (Oral)   Resp 15   Ht 4\' 9"  (1.448 m)   Wt 72.6 kg   SpO2 100%   BMI 34.62 kg/m   Physical Exam Vitals and nursing note reviewed.  Constitutional:      Appearance: She is well-developed.  HENT:     Head: Normocephalic and atraumatic.  Cardiovascular:     Rate and Rhythm: Tachycardia present. Rhythm irregular.     Heart sounds: No murmur heard.   Pulmonary:     Effort: Pulmonary effort is normal. No respiratory distress.     Breath sounds: Normal breath sounds.  Abdominal:     Palpations: Abdomen is soft.     Tenderness: There is no abdominal tenderness. There is no guarding or rebound.  Musculoskeletal:        General: No tenderness.     Comments: 3+ pitting edema to bilateral lower extremities  Skin:    General: Skin is warm and dry.    Neurological:     Mental Status: She is alert and oriented to person, place, and time.     Comments: Five out of five strength in all four extremities.  Psychiatric:        Behavior: Behavior normal.     ED Results / Procedures / Treatments   Labs (all labs ordered are listed, but only abnormal results are displayed) Labs Reviewed  BASIC METABOLIC PANEL - Abnormal; Notable for the following components:      Result Value   Glucose, Bld 160 (*)    BUN 38 (*)    GFR calc non Af Amer 51 (*)    GFR calc Af Amer 59 (*)    All other components within normal limits  CBC - Abnormal; Notable for the following components:   Hemoglobin 10.2 (*)    HCT 34.2 (*)    MCH 24.3 (*)    MCHC 29.8 (*)    RDW 18.1 (*)    All other components within normal limits  BRAIN NATRIURETIC PEPTIDE - Abnormal; Notable for the following components:   B Natriuretic Peptide 375.5 (*)    All other components within normal limits  HEPATIC FUNCTION PANEL - Abnormal; Notable for the following components:   Total Protein 5.5 (*)    Albumin 2.7 (*)    All other components within normal limits  TROPONIN I (HIGH SENSITIVITY) - Abnormal; Notable for the following components:   Troponin I (High Sensitivity) 40 (*)    All other components within normal limits  SARS CORONAVIRUS 2 BY RT PCR (HOSPITAL ORDER, [place] [facility])  LIPASE, BLOOD  BASIC METABOLIC PANEL  CBC  LIPID PANEL  TROPONIN I (HIGH SENSITIVITY)    EKG EKG Interpretation  Date/Time:  Wednesday January 28 2020 17:02:17 EDT Ventricular Rate:  149 PR Interval:    QRS Duration: 75 QT Interval:  291 QTC Calculation: 472 R Axis:   32 Text Interpretation: Atrial fibrillation with rapid V-rate Artifact in lead(s) I III aVR aVL Confirmed by Quintella Reichert [phone]) on 01/28/2020 5:09:07 PM   Radiology DG Chest Port 1 View  Result Date: 01/28/2020 CLINICAL DATA:  New onset atrial fibrillation EXAM: PORTABLE  CHEST 1 VIEW COMPARISON:   11/26/2019, 01/20/2020 FINDINGS: Mild cardiomegaly. Enlarged central pulmonary vessels as before. Aortic atherosclerosis. No consolidation, pleural effusion or pulmonary edema. No pneumothorax. IMPRESSION: Cardiomegaly without edema or infiltrate. Enlarged central pulmonary arteries suspect for pulmonary arterial hypertension. Electronically Signed   By: Donavan Foil M.D.   On: 01/28/2020 17:57    Procedures Procedures (including critical care time) CRITICAL CARE Performed by: Quintella Reichert   Total critical care time: 35 minutes  Critical care time was exclusive of separately billable procedures and treating other patients.  Critical care was necessary to treat or prevent imminent or life-threatening deterioration.  Critical care was time spent personally by me on the following activities: development of treatment plan with patient and/or surrogate as well as nursing, discussions with consultants, evaluation of patient's response to treatment, examination of patient, obtaining history from patient or surrogate, ordering and performing treatments and interventions, ordering and review of laboratory studies, ordering and review of radiographic studies, pulse oximetry and re-evaluation of patient's condition.  Medications Ordered in ED Medications  diltiazem (CARDIZEM) 1 mg/mL load via infusion 10 mg (10 mg Intravenous Bolus from Bag 01/28/20 1841)    And  diltiazem (CARDIZEM) 125 mg in dextrose 5% 125 mL (1 mg/mL) infusion (5 mg/hr Intravenous Restarted 01/28/20 2321)  furosemide (LASIX) tablet 20 mg (has no administration in time range)  losartan (COZAAR) tablet 50 mg (has no administration in time range)  spironolactone (ALDACTONE) tablet 25 mg (has no administration in time range)  calcium carbonate (TUMS - dosed in mg elemental calcium) chewable tablet 200 mg of elemental calcium (has no administration in time range)  docusate sodium (COLACE) capsule 100 mg (has no administration in time  range)  pantoprazole (PROTONIX) EC tablet 40 mg (has no administration in time range)  iron polysaccharides (NIFEREX) capsule 150 mg (has no administration in time range)  vitamin B-12 (CYANOCOBALAMIN) tablet 1,000 mcg (has no administration in time range)  acetaminophen (TYLENOL) tablet 650 mg (has no administration in time range)  ondansetron (ZOFRAN) injection 4 mg (has no administration in time range)  gabapentin (NEURONTIN) capsule 100 mg (has no administration in time range)    And  gabapentin (NEURONTIN) capsule 200 mg (200 mg Oral Given 01/28/20 2158)  polyvinyl alcohol (LIQUIFILM TEARS) 1.4 % ophthalmic solution 1 drop (has no administration in time range)  sodium chloride flush (NS) 0.9 % injection 3 mL (3 mLs Intravenous Given 01/28/20 1839)    ED Course  I have reviewed the triage vital signs and the nursing notes.  Pertinent labs & imaging results that were available during my care of the patient were reviewed by me and considered in my medical decision making (see chart for details).    MDM Rules/Calculators/A&P                          Patient referred to the emergency department for new onset a fib with RVR. Patient did have abdominal pain earlier today, now resolved. Question if rapid atrial fibrillation contributed to her transient abdominal pain. She was recently admitted to the hospital for AMS and was found to have a small SAH, appears to be at her neurologic baseline on ED assessment. She was treated with Cardizem for rate control. Given recent hospitalization for subarachnoid hemorrhage anticoagulation was withheld at this time. Rate controlled after Cardizem initiated. Patient and daughter updated of findings of studies recommendation for admission and they are in agreement with treatment plan.  Hospitalist consulted for admission. Final Clinical Impression(s) / ED Diagnoses Final diagnoses:  Atrial fibrillation with RVR [facility])    Rx / DC Orders ED Discharge Orders           Ordered    Amb referral to [place]  Reprint     01/28/20 2046           Quintella Reichert, MD 01/28/20 2344

## 2020-01-28 NOTE — H&P (Addendum)
History and Physical    [name] ZOX:[id number] DOB: 1929-12-16 DOA: 01/28/2020  PCP: Audley Hose, MD   Patient coming from: home  I have personally briefly reviewed patient's old medical records in [facility]  Chief Complaint: Sent by PCP with diagnosis of new onset A. fib  HPI: [name] is a 86 y.o. female with medical history significant for HTN, recently diagnosed with progressive cerebral amyloid angiopathy, during hospitalization from 01/20/2020 to 01/23/2020 after presenting with AMS and hallucinations, with finding of small subarachnoid and subdural hematoma on MRI on 01/21/2020, treated with Solu-Medrol and discharged on prednisone and Protonix, who went to her PCPs office for posthospitalization follow-up with a complaint for abdominal pain, with work-up revealing rapid A. fib with rate in the 160s.  She denied chest pain or shortness of breath, lightheadedness or palpitations.  Abdominal pain was a one-time episode mostly in the upper abdomen of moderate to severe intensity and radiating to the back associated with decreased appetite no associated nausea vomiting or change in bowel habit.  No black or bloody stool.  Was alleviated an hour later by drinking a warm Sprite  ED Course: On arrival, she appeared comfortable.  Vitals were unremarkable however EKG showed A. fib with RVR of 166.  Lipase was normal.  BNP elevated at 375.5.  Other blood work at baseline and unremarkable.  Chest x-ray showed cardiomegaly without edema or infiltrate.  Patient was treated with a diltiazem bolus and started on diltiazem infusion.  Hospitalist consulted for admission.  Review of Systems: As per HPI otherwise all other systems on review of systems negative.    Past Medical History:  Diagnosis Date  . CAP (community acquired pneumonia) 09/21/2015  . Chronic kidney disease, stage III (moderate)   . Encounter for long-term (current) use of other medications   . Glaucoma (increased  eye pressure)   . Hard of hearing   . Head pain   . Hypertension   . Impaired glucose tolerance test   . Insomnia, unspecified   . Memory loss   . Morbid obesity ([place])   . Neck pain   . Osteoarthrosis, unspecified whether generalized or localized, other specified sites   . Other lymphedema   . Pain in joint, multiple sites   . Primary open-angle glaucoma(365.11)     Past Surgical History:  Procedure Laterality Date  . ABDOMINAL HYSTERECTOMY    . EYE SURGERY       reports that she has never smoked. She has never used smokeless tobacco. She reports that she does not drink alcohol and does not use drugs.  Allergies  Allergen Reactions  . Penicillins Hives    Has patient had a PCN reaction causing immediate rash, facial/tongue/throat swelling, SOB or lightheadedness with hypotension: No Has patient had a PCN reaction causing severe rash involving mucus membranes or skin necrosis: No Has patient had a PCN reaction that required hospitalization No Has patient had a PCN reaction occurring within the last 10 years: No If all of the above answers are "NO", then may proceed with Cephalosporin use.  . Tetanus Toxoid Hives    Family History  Problem Relation Age of Onset  . Other Mother        "old age"  . Stomach cancer Father   . Stomach cancer Sister   . Aneurysm Brother       Prior to Admission medications   Medication Sig Start Date End Date Taking? Authorizing Provider  acetaminophen (TYLENOL)  500 MG tablet Take 1 tablet (500 mg total) by mouth at bedtime. 12/16/19  Yes Oscar La, Arlo C, PA-C  calcium carbonate (TUMS) 500 MG chewable tablet Chew 1 tablet (200 mg of elemental calcium total) by mouth daily. 01/23/20 02/22/20 Yes Samuella Cota, MD  carboxymethylcellulose (ARTIFICIAL TEARS) 1 % ophthalmic solution Place 1 drop into both eyes 3 (three) times daily as needed (for lubercating eyes). 12/16/19  Yes Lassen, Arlo C, PA-C  [employer] VITAMIN C 500 MG tablet Take 1 tablet (500  mg total) by mouth daily. 12/16/19  Yes Lassen, Arlo C, PA-C  docusate sodium (COLACE) 50 MG capsule Take 1-2 capsules (50-100 mg total) by mouth daily as needed for mild constipation. 01/23/20  Yes Samuella Cota, MD  Ensure Max Protein (ENSURE MAX PROTEIN) LIQD Take 330 mLs (11 oz total) by mouth daily. 01/24/20  Yes Samuella Cota, MD  furosemide (LASIX) 20 MG tablet Take 1 tablet (20 mg total) by mouth every Monday, Wednesday, and Friday. Patient taking differently: Take 20 mg by mouth daily.  12/17/19  Yes Lassen, Arlo C, PA-C  gabapentin (NEURONTIN) 100 MG capsule Take 1-2 capsules (100-200 mg total) by mouth See admin instructions. Take 100mg  in AM and 200mg  at bedtime 01/23/20  Yes Samuella Cota, MD  Garlic 9233 MG CAPS Take 2 capsules (2,000 mg total) by mouth daily. Take 2 capsules to = 2000 mg 12/16/19  Yes Lassen, Arlo C, PA-C  iron polysaccharides (NIFEREX) 150 MG capsule Take 150 mg by mouth daily.   Yes [provider]  losartan (COZAAR) 50 MG tablet Take 50 mg by mouth daily.   Yes [provider]  pantoprazole (PROTONIX) 40 MG tablet Take 1 tablet (40 mg total) by mouth daily. 01/23/20 03/23/20 Yes Samuella Cota, MD  predniSONE (DELTASONE) 20 MG tablet Take 2 tablets (40 mg total) by mouth daily with breakfast. 01/24/20  Yes Samuella Cota, MD  spironolactone (ALDACTONE) 25 MG tablet Take 25 mg by mouth daily.   Yes [provider]  tetrahydrozoline 0.05 % ophthalmic solution Place 1 drop into both eyes daily as needed (eye irritation). 12/16/19  Yes Lassen, Arlo C, PA-C  trolamine salicylate (ASPERCREME) 10 % cream Apply 1 application topically as needed for muscle pain.   Yes [provider]  vitamin B-12 (CYANOCOBALAMIN) 1000 MCG tablet Take 1 tablet (1,000 mcg total) by mouth daily. 12/16/19  Yes Lassen, Arlo C, PA-C  NON FORMULARY Diet Order: Regular NAS, Heart Healthy Diet, thin liquids.    [provider]    Physical  Exam: Vitals:   01/28/20 2038 01/28/20 2039 01/28/20 2040 01/28/20 2041  BP:      Pulse: 61 (!) 48 100 93  Resp: 16 17 (!) 22 (!) 22  Temp:      TempSrc:      SpO2: 100% 100% 100% 100%     Vitals:   01/28/20 2038 01/28/20 2039 01/28/20 2040 01/28/20 2041  BP:      Pulse: 61 (!) 48 100 93  Resp: 16 17 (!) 22 (!) 22  Temp:      TempSrc:      SpO2: 100% 100% 100% 100%      Constitutional: Alert and oriented x 3 . Not in any apparent distress HEENT:      Head: Normocephalic and atraumatic.         Eyes: PERLA, EOMI, Conjunctivae are normal.  Opacity of the left cornea  Mouth/Throat: Mucous membranes are moist.       Neck: Supple with no signs of meningismus. Cardiovascular:  Irregularly irregular. No murmurs, gallops, or rubs. 2+ symmetrical distal pulses are present . No JVD.  2+ nonpitting LE edema Respiratory: Respiratory effort normal .Lungs sounds clear bilaterally. No wheezes, crackles, or rhonchi.  Gastrointestinal: Soft, non tender, and non distended with positive bowel sounds. No rebound or guarding. Genitourinary: No CVA tenderness. Musculoskeletal: Nontender with normal range of motion in all extremities. No cyanosis, or erythema of extremities. Neurologic: Normal speech and language. Face is symmetric. Moving all extremities. No gross focal neurologic deficits . Skin: Skin is warm, dry.  No rash or ulcers Psychiatric: Mood and affect are normal Speech and behavior are normal   Labs on Admission: I have personally reviewed following labs and imaging studies  CBC: Recent Labs  Lab 01/28/20 1703  WBC 8.2  HGB 10.2*  HCT 34.2*  MCV 81.4  PLT 921   Basic Metabolic Panel: Recent Labs  Lab 01/28/20 1703  NA 139  K 3.9  CL 102  CO2 28  GLUCOSE 160*  BUN 38*  CREATININE 1.00  CALCIUM 9.0   GFR: Estimated Creatinine Clearance: 32.7 mL/min (by C-G formula based on SCr of 1 mg/dL). Liver Function Tests: Recent Labs  Lab 01/28/20 1703  AST 19    ALT 17  ALKPHOS 63  BILITOT 0.6  PROT 5.5*  ALBUMIN 2.7*   Recent Labs  Lab 01/28/20 1703  LIPASE 27   No results for input(s): AMMONIA in the last 168 hours. Coagulation Profile: No results for input(s): INR, PROTIME in the last 168 hours. Cardiac Enzymes: No results for input(s): CKTOTAL, CKMB, CKMBINDEX, TROPONINI in the last 168 hours. BNP (last 3 results) No results for input(s): PROBNP in the last 8760 hours. HbA1C: No results for input(s): HGBA1C in the last 72 hours. CBG: No results for input(s): GLUCAP in the last 168 hours. Lipid Profile: No results for input(s): CHOL, HDL, LDLCALC, TRIG, CHOLHDL, LDLDIRECT in the last 72 hours. Thyroid Function Tests: No results for input(s): TSH, T4TOTAL, FREET4, T3FREE, THYROIDAB in the last 72 hours. Anemia Panel: No results for input(s): VITAMINB12, FOLATE, FERRITIN, TIBC, IRON, RETICCTPCT in the last 72 hours. Urine analysis:    Component Value Date/Time   COLORURINE YELLOW 01/20/2020 1657   APPEARANCEUR CLEAR 01/20/2020 1657   LABSPEC 1.012 01/20/2020 1657   PHURINE 6.0 01/20/2020 1657   GLUCOSEU NEGATIVE 01/20/2020 1657   HGBUR SMALL (A) 01/20/2020 1657   BILIRUBINUR NEGATIVE 01/20/2020 1657   KETONESUR NEGATIVE 01/20/2020 1657   PROTEINUR NEGATIVE 01/20/2020 1657   NITRITE NEGATIVE 01/20/2020 1657   LEUKOCYTESUR NEGATIVE 01/20/2020 1657    Radiological Exams on Admission: DG Chest Port 1 View  Result Date: 01/28/2020 CLINICAL DATA:  New onset atrial fibrillation EXAM: PORTABLE CHEST 1 VIEW COMPARISON:  11/26/2019, 01/20/2020 FINDINGS: Mild cardiomegaly. Enlarged central pulmonary vessels as before. Aortic atherosclerosis. No consolidation, pleural effusion or pulmonary edema. No pneumothorax. IMPRESSION: Cardiomegaly without edema or infiltrate. Enlarged central pulmonary arteries suspect for pulmonary arterial hypertension. Electronically Signed   By: Donavan Foil M.D.   On: 01/28/2020 17:57    EKG:  Independently reviewed. Interpretation : A. fib with rate 166.  Assessment/Plan 86 year old female with medical history significant for HTN, recently diagnosed with progressive cerebral amyloid angiopathy, during hospitalization from 01/20/2020 to 01/23/2020 after presenting with AMS and hallucinations, with finding of small subarachnoid and subdural hematoma on MRI on 01/21/2020 sent from PCPs office with  rapid A. fib with rate in the 160s after post hospital follow-up.  Patient was complaining of abdominal pain when work-up was started. .   Atrial fibrillation with rapid ventricular response (HCC)   New onset atrial fibrillation (HCC) -EKG with A. fib rate in the 160s -Continue diltiazem infusion to titrate to oral per protocol -CHA2DS2-VASc score of 4 but not a candidate for anticoagulation for stroke prevention given recent history of subdural hematoma and subarachnoid hemorrhage on MRI 01/21/2020 -We will get baseline troponin, especially in view of presentation with abdominal pain  Abdominal/epigastric pain -Likely secondary to recent Solu-Medrol/prednisone therapy for her diagnosed cerebral amyloid angiopathy -Differential could be cardiac so we will get troponins.  BNP was elevated at 362.  No congestion on chest x-ray and patient symptomatic    Cerebral amyloid angiopathy ([place]) -Patient completed a course of prednisone on the day of arrival and will be following up with neurology -She presented with altered mental status and hallucinations    History of subdural hematoma, 01/21/20   History of subarachnoid hemorrhage, 01/21/20 -Avoiding blood thinners    DVT prophylaxis: SCDs Code Status: full code, discussed with patient with daughter at bedside Family Communication: Daughter Felicia at bedside Disposition Plan: Back to previous home environment Consults called: none  Status: Observation    Athena Masse MD [facility]     01/28/2020, 8:47 PM

## 2020-01-29 ENCOUNTER — Observation Stay (HOSPITAL_BASED_OUTPATIENT_CLINIC_OR_DEPARTMENT_OTHER): Payer: Medicare Other

## 2020-01-29 ENCOUNTER — Other Ambulatory Visit: Payer: Self-pay

## 2020-01-29 ENCOUNTER — Encounter (HOSPITAL_COMMUNITY): Payer: Self-pay | Admitting: Internal Medicine

## 2020-01-29 DIAGNOSIS — I340 Nonrheumatic mitral (valve) insufficiency: Secondary | ICD-10-CM | POA: Diagnosis not present

## 2020-01-29 DIAGNOSIS — E854 Organ-limited amyloidosis: Secondary | ICD-10-CM

## 2020-01-29 DIAGNOSIS — I4891 Unspecified atrial fibrillation: Secondary | ICD-10-CM | POA: Diagnosis not present

## 2020-01-29 DIAGNOSIS — Z8679 Personal history of other diseases of the circulatory system: Secondary | ICD-10-CM | POA: Diagnosis not present

## 2020-01-29 DIAGNOSIS — I680 Cerebral amyloid angiopathy: Secondary | ICD-10-CM

## 2020-01-29 DIAGNOSIS — I361 Nonrheumatic tricuspid (valve) insufficiency: Secondary | ICD-10-CM | POA: Diagnosis not present

## 2020-01-29 DIAGNOSIS — R1013 Epigastric pain: Secondary | ICD-10-CM | POA: Diagnosis not present

## 2020-01-29 LAB — LIPID PANEL
Cholesterol: 141 mg/dL (ref 0–200)
HDL: 71 mg/dL (ref >40)
LDL Cholesterol: 61 mg/dL (ref 0–99)
Total CHOL/HDL Ratio: 2 RATIO
Triglycerides: 46 mg/dL (ref <150)
VLDL: 9 mg/dL (ref 0–40)

## 2020-01-29 LAB — CBC
HCT: 37.5 % (ref 36.0–46.0)
Hemoglobin: 11.1 g/dL — ABNORMAL LOW (ref 12.0–15.0)
MCH: 25.1 pg — ABNORMAL LOW (ref 26.0–34.0)
MCHC: 29.6 g/dL — ABNORMAL LOW (ref 30.0–36.0)
MCV: 84.7 fL (ref 80.0–100.0)
Platelets: 298 10*3/uL (ref 150–400)
RBC: 4.43 MIL/uL (ref 3.87–5.11)
RDW: 18.3 % — ABNORMAL HIGH (ref 11.5–15.5)
WBC: 7.9 10*3/uL (ref 4.0–10.5)
nRBC: 0 % (ref 0.0–0.2)

## 2020-01-29 LAB — BASIC METABOLIC PANEL
Anion gap: 11 (ref 5–15)
BUN: 33 mg/dL — ABNORMAL HIGH (ref 8–23)
CO2: 25 mmol/L (ref 22–32)
Calcium: 8.7 mg/dL — ABNORMAL LOW (ref 8.9–10.3)
Chloride: 104 mmol/L (ref 98–111)
Creatinine, Ser: 1 mg/dL (ref 0.44–1.00)
GFR calc Af Amer: 59 mL/min — ABNORMAL LOW (ref >60)
GFR calc non Af Amer: 51 mL/min — ABNORMAL LOW (ref >60)
Glucose, Bld: 132 mg/dL — ABNORMAL HIGH (ref 70–99)
Potassium: 3.6 mmol/L (ref 3.5–5.1)
Sodium: 140 mmol/L (ref 135–145)

## 2020-01-29 LAB — ECHOCARDIOGRAM COMPLETE
Height: 57 in
Weight: 2560 oz

## 2020-01-29 LAB — TROPONIN I (HIGH SENSITIVITY): Troponin I (High Sensitivity): 38 ng/L — ABNORMAL HIGH (ref <18)

## 2020-01-29 MED ORDER — DILTIAZEM HCL 60 MG PO TABS
60.0000 mg | ORAL_TABLET | Freq: Three times a day (TID) | ORAL | Status: DC
  Administered 2020-01-29: 60 mg via ORAL
  Filled 2020-01-29 (×2): qty 1

## 2020-01-29 MED ORDER — DILTIAZEM HCL 60 MG PO TABS
60.0000 mg | ORAL_TABLET | Freq: Three times a day (TID) | ORAL | Status: DC
  Administered 2020-01-29 – 2020-01-30 (×2): 60 mg via ORAL
  Filled 2020-01-29 (×2): qty 1

## 2020-01-29 MED ORDER — PREDNISONE 20 MG PO TABS
40.0000 mg | ORAL_TABLET | Freq: Every day | ORAL | Status: DC
  Administered 2020-01-29 – 2020-01-30 (×2): 40 mg via ORAL
  Filled 2020-01-29 (×3): qty 2

## 2020-01-29 NOTE — ED Notes (Signed)
SDU Breakfast Ordered 

## 2020-01-29 NOTE — Progress Notes (Signed)
PROGRESS NOTE    [name]  RCV:[id number] DOB: [birth year] DOA: 01/28/2020 PCP: Audley Hose, MD    Brief Narrative:  86 y.o. female with medical history significant for HTN, recently diagnosed with progressive cerebral amyloid angiopathy, during hospitalization from 01/20/2020 to 01/23/2020 after presenting with AMS and hallucinations, with finding of small subarachnoid and subdural hematoma on MRI on 01/21/2020, treated with Solu-Medrol and discharged on prednisone and Protonix, who went to her PCPs office for posthospitalization follow-up with a complaint for abdominal pain, with work-up revealing rapid A. fib with rate in the 160s.  She denied chest pain or shortness of breath, lightheadedness or palpitations.  Abdominal pain was a one-time episode mostly in the upper abdomen of moderate to severe intensity and radiating to the back associated with decreased appetite no associated nausea vomiting or change in bowel habit.  No black or bloody stool.  Was alleviated an hour later by drinking a warm Sprite  ED Course: On arrival, she appeared comfortable.  Vitals were unremarkable however EKG showed A. fib with RVR of 166.  Lipase was normal.  BNP elevated at 375.5.  Other blood work at baseline and unremarkable.  Chest x-ray showed cardiomegaly without edema or infiltrate.  Patient was treated with a diltiazem bolus and started on diltiazem infusion.  Hospitalist consulted for admission.  Assessment & Plan:   Principal Problem:   Atrial fibrillation with rapid ventricular response (HCC) Active Problems:   Cerebral amyloid angiopathy (HCC)   New onset atrial fibrillation (HCC)   History of subdural hematoma, 01/21/20   History of subarachnoid hemorrhage, 01/21/20   Epigastric pain    Atrial fibrillation with rapid ventricular response ([place])   New onset atrial fibrillation (HCC) -EKG with A. fib rate in the 160s -Was started on diltiazem infusion for rate control -CHA2DS2-VASc score  of 4 but not a candidate for anticoagulation for stroke prevention given recent history of subdural hematoma and subarachnoid hemorrhage on MRI 01/21/2020 -Serial trop trending down, only minimally elevated, likely secondary to presenting RVR -Pt now rate controlled, will wean off cardizem gtt and cont on PO cardizem. Likely transition to CD dosing tomorrow if pt remains stable  Abdominal/epigastric pain -Suspected secondary to recent Solu-Medrol/prednisone therapy for her diagnosed cerebral amyloid angiopathy    Cerebral amyloid angiopathy ([place]) -Patient completed a course of prednisone on the day of arrival and will be following up with neurology -She presented with altered mental status and hallucinations -Seems to be stable this AM    History of subdural hematoma, 01/21/20   History of subarachnoid hemorrhage, 01/21/20 -Appears to be neurologically stable -Will cont to hold off on therapeutic anticoagulation given recent intracranial hemorrhage  DVT prophylaxis: SCD's Code Status: Full Family Communication: Pt in room, family not at bedside  Status is: Observation  The patient remains OBS appropriate and will d/c before 2 midnights.  Dispo: The patient is from: Home              Anticipated d/c is to: Pending PT eval              Anticipated d/c date is: 1 day              Patient currently is not medically stable to d/c.  Consultants:     Procedures:     Antimicrobials: Anti-infectives (From admission, onward)   None       Subjective: Without complaints this AM  Objective: Vitals:   01/29/20 0900 01/29/20 1108 01/29/20 1115  01/29/20 1152  BP: (!) 157/79 134/69  (!) 143/69  Pulse: 65 76  87  Resp: (!) 22 16  20   Temp:   98 F (36.7 C) 97.7 F (36.5 C)  TempSrc:   Oral Oral  SpO2: 100% 100%  100%  Weight:      Height:        Intake/Output Summary (Last 24 hours) at 01/29/2020 1533 Last data filed at 01/29/2020 0943 Gross per 24 hour  Intake 38.33 ml   Output 900 ml  Net -861.67 ml   Filed Weights   01/28/20 2159  Weight: 72.6 kg    Examination:  General exam: Appears calm and comfortable  Respiratory system: Clear to auscultation. Respiratory effort normal. Cardiovascular system: S1 & S2 heard, Regular Gastrointestinal system: Abdomen is nondistended, No organomegaly or masses felt. Normal bowel sounds heard. Central nervous system: Alert and oriented. No focal neurological deficits. Extremities: Symmetric 5 x 5 power. Skin: No rashes, lesions  Psychiatry: Judgement and insight appear normal. Mood & affect appropriate.   Data Reviewed: I have personally reviewed following labs and imaging studies  CBC: Recent Labs  Lab 01/28/20 1703 01/29/20 0500  WBC 8.2 7.9  HGB 10.2* 11.1*  HCT 34.2* 37.5  MCV 81.4 84.7  PLT 318 030   Basic Metabolic Panel: Recent Labs  Lab 01/28/20 1703 01/29/20 0500  NA 139 140  K 3.9 3.6  CL 102 104  CO2 28 25  GLUCOSE 160* 132*  BUN 38* 33*  CREATININE 1.00 1.00  CALCIUM 9.0 8.7*   GFR: Estimated Creatinine Clearance: 32.7 mL/min (by C-G formula based on SCr of 1 mg/dL). Liver Function Tests: Recent Labs  Lab 01/28/20 1703  AST 19  ALT 17  ALKPHOS 63  BILITOT 0.6  PROT 5.5*  ALBUMIN 2.7*   Recent Labs  Lab 01/28/20 1703  LIPASE 27   No results for input(s): AMMONIA in the last 168 hours. Coagulation Profile: No results for input(s): INR, PROTIME in the last 168 hours. Cardiac Enzymes: No results for input(s): CKTOTAL, CKMB, CKMBINDEX, TROPONINI in the last 168 hours. BNP (last 3 results) No results for input(s): PROBNP in the last 8760 hours. HbA1C: No results for input(s): HGBA1C in the last 72 hours. CBG: No results for input(s): GLUCAP in the last 168 hours. Lipid Profile: Recent Labs    01/29/20 0500  CHOL 141  HDL 71  LDLCALC 61  TRIG 46  CHOLHDL 2.0   Thyroid Function Tests: No results for input(s): TSH, T4TOTAL, FREET4, T3FREE, THYROIDAB in the  last 72 hours. Anemia Panel: No results for input(s): VITAMINB12, FOLATE, FERRITIN, TIBC, IRON, RETICCTPCT in the last 72 hours. Sepsis Labs: No results for input(s): PROCALCITON, LATICACIDVEN in the last 168 hours.  Recent Results (from the past 240 hour(s))  Blood culture (routine x 2)     Status: None   Collection Time: 01/20/20  3:40 PM   Specimen: BLOOD LEFT ARM  Result Value Ref Range Status   Specimen Description   Final    BLOOD LEFT ARM Performed at [facility], [address] [place], [place] [address]    Special Requests   Final    BOTTLES DRAWN AEROBIC AND ANAEROBIC Blood Culture adequate volume Performed at [facility] [address] [place], [place] [address]    Culture   Final    NO GROWTH 5 DAYS Performed at [facility], [place] [address] [place], [place] [address]    Report  Status 01/25/2020 FINAL  Final  Urine culture     Status: Abnormal   Collection Time: 01/20/20  4:57 PM   Specimen: Urine, Clean Catch  Result Value Ref Range Status   Specimen Description   Final    URINE, CLEAN CATCH Performed at [facility], [place] [address], [place], [place] [address]    Special Requests   Final    NONE Performed at [facility], [place] [address] [place], [place] [address]    Culture MULTIPLE SPECIES PRESENT, SUGGEST RECOLLECTION (A)  Final   Report Status 01/21/2020 FINAL  Final  SARS Coronavirus 2 by RT PCR (hospital order, performed in [facility] hospital lab) Nasopharyngeal Nasopharyngeal Swab     Status: None   Collection Time: 01/20/20  5:59 PM   Specimen: Nasopharyngeal Swab  Result Value Ref Range Status   SARS Coronavirus 2 NEGATIVE NEGATIVE Final    Comment: (NOTE) SARS-CoV-2 target nucleic acids are NOT DETECTED.  The SARS-CoV-2 RNA is generally detectable in upper and lower respiratory specimens during the acute phase of infection. The lowest concentration of SARS-CoV-2 viral  copies this assay can detect is 250 copies / mL. A negative result does not preclude SARS-CoV-2 infection and should not be used as the sole basis for treatment or other patient management decisions.  A negative result may occur with improper specimen collection / handling, submission of specimen other than nasopharyngeal swab, presence of viral mutation(s) within the areas targeted by this assay, and inadequate number of viral copies (<250 copies / mL). A negative result must be combined with clinical observations, patient history, and epidemiological information.  Fact Sheet for Patients:   StrictlyIdeas.no  Fact Sheet for Healthcare Providers: BankingDealers.co.za  This test is not yet approved or  cleared by the [place] FDA and has been authorized for detection and/or diagnosis of SARS-CoV-2 by FDA under an Emergency Use Authorization (EUA).  This EUA will remain in effect (meaning this test can be used) for the duration of the COVID-19 declaration under Section 564(b)(1) of the Act, 21 U.S.C. section 360bbb-3(b)(1), unless the authorization is terminated or revoked sooner.  Performed at [facility], [place] [address], [place], [place] [address]   SARS Coronavirus 2 by RT PCR (hospital order, performed in [facility] hospital lab) Nasopharyngeal Nasopharyngeal Swab     Status: None   Collection Time: 01/28/20  5:53 PM   Specimen: Nasopharyngeal Swab  Result Value Ref Range Status   SARS Coronavirus 2 NEGATIVE NEGATIVE Final    Comment: (NOTE) SARS-CoV-2 target nucleic acids are NOT DETECTED.  The SARS-CoV-2 RNA is generally detectable in upper and lower respiratory specimens during the acute phase of infection. The lowest concentration of SARS-CoV-2 viral copies this assay can detect is 250 copies / mL. A negative result does not preclude SARS-CoV-2 infection and should not be used as the sole basis for  treatment or other patient management decisions.  A negative result may occur with improper specimen collection / handling, submission of specimen other than nasopharyngeal swab, presence of viral mutation(s) within the areas targeted by this assay, and inadequate number of viral copies (<250 copies / mL). A negative result must be combined with clinical observations, patient history, and epidemiological information.  Fact Sheet for Patients:   StrictlyIdeas.no  Fact Sheet for Healthcare Providers: BankingDealers.co.za  This test is not yet approved or  cleared by the [place] FDA and has been authorized for detection and/or diagnosis of SARS-CoV-2  by FDA under an Emergency Use Authorization (EUA).  This EUA will remain in effect (meaning this test can be used) for the duration of the COVID-19 declaration under Section 564(b)(1) of the Act, 21 U.S.C. section 360bbb-3(b)(1), unless the authorization is terminated or revoked sooner.  Performed at [facility], [place] [address], [place], [place] [address]      Radiology Studies: DG Chest Port 1 View  Result Date: 01/28/2020 CLINICAL DATA:  New onset atrial fibrillation EXAM: PORTABLE CHEST 1 VIEW COMPARISON:  11/26/2019, 01/20/2020 FINDINGS: Mild cardiomegaly. Enlarged central pulmonary vessels as before. Aortic atherosclerosis. No consolidation, pleural effusion or pulmonary edema. No pneumothorax. IMPRESSION: Cardiomegaly without edema or infiltrate. Enlarged central pulmonary arteries suspect for pulmonary arterial hypertension. Electronically Signed   By: Donavan Foil M.D.   On: 01/28/2020 17:57    Scheduled Meds: . calcium carbonate  1 tablet Oral Daily  . diltiazem  60 mg Oral Q8H  . furosemide  20 mg Oral Daily  . gabapentin  100 mg Oral Daily   And  . gabapentin  200 mg Oral QHS  . iron polysaccharides  150 mg Oral Daily  . losartan  50 mg Oral Daily  .  pantoprazole  40 mg Oral Daily  . predniSONE  40 mg Oral Q breakfast  . spironolactone  25 mg Oral Daily  . vitamin B-12  1,000 mcg Oral Daily   Continuous Infusions: . diltiazem (CARDIZEM) infusion Stopped (01/29/20 1054)     LOS: 0 days   Marylu Lund, MD [facility] Pager On Amion  If 7PM-7AM, please contact night-coverage 01/29/2020, 3:33 PM

## 2020-01-29 NOTE — Progress Notes (Signed)
  Echocardiogram 2D Echocardiogram has been performed.  Jennette Dubin 01/29/2020, 10:43 AM

## 2020-01-30 DIAGNOSIS — Z8679 Personal history of other diseases of the circulatory system: Secondary | ICD-10-CM | POA: Diagnosis not present

## 2020-01-30 DIAGNOSIS — I4891 Unspecified atrial fibrillation: Secondary | ICD-10-CM | POA: Diagnosis not present

## 2020-01-30 DIAGNOSIS — I680 Cerebral amyloid angiopathy: Secondary | ICD-10-CM | POA: Diagnosis not present

## 2020-01-30 DIAGNOSIS — R1013 Epigastric pain: Secondary | ICD-10-CM

## 2020-01-30 MED ORDER — DILTIAZEM HCL ER COATED BEADS 180 MG PO CP24
180.0000 mg | ORAL_CAPSULE | Freq: Every day | ORAL | Status: DC
  Filled 2020-01-30: qty 1

## 2020-01-30 MED ORDER — AMIODARONE HCL 200 MG PO TABS: 200.0000 mg | ORAL_TABLET | Freq: Every day | ORAL | Status: DC

## 2020-01-30 MED ORDER — AMIODARONE HCL 200 MG PO TABS
200.0000 mg | ORAL_TABLET | Freq: Every day | ORAL | Status: DC
  Administered 2020-01-30: 200 mg via ORAL
  Filled 2020-01-30: qty 1

## 2020-01-30 MED ORDER — DILTIAZEM HCL ER COATED BEADS 120 MG PO CP24: 120.0000 mg | ORAL_CAPSULE | Freq: Every day | ORAL | Status: DC

## 2020-01-30 MED ORDER — AMIODARONE HCL 200 MG PO TABS: 200.0000 mg | ORAL_TABLET | Freq: Every day | ORAL | 0 refills | Status: DC

## 2020-01-30 MED FILL — AMIODARONE HCL 200 MG TABS: 200 | 30 days supply | Qty: 30 | Fill #0

## 2020-01-30 NOTE — Discharge Instructions (Signed)
Atrial Fibrillation  Atrial fibrillation is a type of irregular or rapid heartbeat (arrhythmia). In atrial fibrillation, the top part of the heart (atria) beats in an irregular pattern. This makes the heart unable to pump blood normally and effectively. The goal of treatment is to prevent blood clots from forming, control your heart rate, or restore your heartbeat to a normal rhythm. If this condition is not treated, it can cause serious problems, such as a weakened heart muscle (cardiomyopathy) or a stroke. What are the causes? This condition is often caused by medical conditions that damage the heart's electrical system. These include:  High blood pressure (hypertension). This is the most common cause.  Certain heart problems or conditions, such as heart failure, coronary artery disease, heart valve problems, or heart surgery.  Diabetes.  Overactive thyroid (hyperthyroidism).  Obesity.  Chronic kidney disease. In some cases, the cause of this condition is not known. What increases the risk? This condition is more likely to develop in:  Older people.  People who smoke.  Athletes who Lianne Carreto endurance exercise.  People who have a family history of atrial fibrillation.  Men.  People who use drugs.  People who drink a lot of alcohol.  People who have lung conditions, such as emphysema, pneumonia, or COPD.  People who have obstructive sleep apnea. What are the signs or symptoms? Symptoms of this condition include:  A feeling that your heart is racing or beating irregularly.  Discomfort or pain in your chest.  Shortness of breath.  Sudden light-headedness or weakness.  Tiring easily during exercise or activity.  Fatigue.  Syncope (fainting).  Sweating. In some cases, there are no symptoms. How is this diagnosed? Your health care provider may detect atrial fibrillation when taking your pulse. If detected, this condition may be diagnosed with:  An electrocardiogram  (ECG) to check electrical signals of the heart.  An ambulatory cardiac monitor to record your heart's activity for a few days.  A transthoracic echocardiogram (TTE) to create pictures of your heart.  A transesophageal echocardiogram (TEE) to create even closer pictures of your heart.  A stress test to check your blood supply while you exercise.  Imaging tests, such as a CT scan or chest X-ray.  Blood tests. How is this treated? Treatment depends on underlying conditions and how you feel when you experience atrial fibrillation. This condition may be treated with:  Medicines to prevent blood clots or to treat heart rate or heart rhythm problems.  Electrical cardioversion to reset the heart's rhythm.  A pacemaker to correct abnormal heart rhythm.  Ablation to remove the heart tissue that sends abnormal signals.  Left atrial appendage closure to seal the area where blood clots can form. In some cases, underlying conditions will be treated. Follow these instructions at home: Medicines  Take over-the counter and prescription medicines only as told by your health care provider.  Maxi Rodas not take any new medicines without talking to your health care provider.  If you are taking blood thinners: ? Talk with your health care provider before you take any medicines that contain aspirin or NSAIDs, such as ibuprofen. These medicines increase your risk for dangerous bleeding. ? Take your medicine exactly as told, at the same time every day. ? Avoid activities that could cause injury or bruising, and follow instructions about how to prevent falls. ? Wear a medical alert bracelet or carry a card that lists what medicines you take. Lifestyle      Genelle Economou not use any products   that contain nicotine or tobacco, such as cigarettes, e-cigarettes, and chewing tobacco. If you need help quitting, ask your health care provider.  Eat heart-healthy foods. Talk with a dietitian to make an eating plan that is  right for you.  Exercise regularly as told by your health care provider.  Latausha Flamm not drink alcohol.  Lose weight if you are overweight.  Shivaun Bilello not use drugs, including cannabis. General instructions  If you have obstructive sleep apnea, manage your condition as told by your health care provider.  Dontel Harshberger not use diet pills unless your health care provider approves. Diet pills can make heart problems worse.  Keep all follow-up visits as told by your health care provider. This is important. Contact a health care provider if you:  Notice a change in the rate, rhythm, or strength of your heartbeat.  Are taking a blood thinner and you notice more bruising.  Tire more easily when you exercise or Shalini Mair heavy work.  Have a sudden change in weight. Get help right away if you have:   Chest pain, abdominal pain, sweating, or weakness.  Trouble breathing.  Side effects of blood thinners, such as blood in your vomit, stool, or urine, or bleeding that cannot stop.  Any symptoms of a stroke. "BE FAST" is an easy way to remember the main warning signs of a stroke: ? B - Balance. Signs are dizziness, sudden trouble walking, or loss of balance. ? E - Eyes. Signs are trouble seeing or a sudden change in vision. ? F - Face. Signs are sudden weakness or numbness of the face, or the face or eyelid drooping on one side. ? A - Arms. Signs are weakness or numbness in an arm. This happens suddenly and usually on one side of the body. ? S - Speech. Signs are sudden trouble speaking, slurred speech, or trouble understanding what people say. ? T - Time. Time to call emergency services. Write down what time symptoms started.  Other signs of a stroke, such as: ? A sudden, severe headache with no known cause. ? Nausea or vomiting. ? Seizure. These symptoms may represent a serious problem that is an emergency. Ollen Rao not wait to see if the symptoms will go away. Get medical help right away. Call your local emergency  services (911 in the [place] Micaiah Remillard not drive yourself to the hospital. Summary  Atrial fibrillation is a type of irregular or rapid heartbeat (arrhythmia).  Symptoms include a feeling that your heart is beating fast or irregularly.  You may be given medicines to prevent blood clots or to treat heart rate or heart rhythm problems.  Get help right away if you have signs or symptoms of a stroke.  Get help right away if you cannot catch your breath or have chest pain or pressure. This information is not intended to replace advice given to you by your health care provider. Make sure you discuss any questions you have with your health care provider. Document Revised: 01/08/2019 Document Reviewed: 01/08/2019 Elsevier Patient Education  2020 [employer]  

## 2020-01-30 NOTE — Progress Notes (Signed)
Pt's stable, DC home via wheelchair with family.

## 2020-01-30 NOTE — TOC Initial Note (Addendum)
Transition of Care [facility]) - Initial/Assessment Note    Patient Details  Name: [name] MRN: [id number] Date of Birth: [birth year]  [facility] CM/SW Contact:    Arvella Merles, [place] Phone Number: 01/30/2020, 2:10 PM  Clinical Narrative:                 CSW met with patient and introduced self and role. Patient provided CSW permission to contact her daughter Salvatore Marvel to discuss discharge plan. CSW contacted Ms. Sanders explained PT recommendation for short-term SNF placement at discharge.  Ms. Baird Cancer expressed she does not know if SNF placement is the best best discharge plan at this time. Ms. Baird Cancer shared herself or patient's granddaughters are always with her and she also receives [facility] services from [place]. Ms. Baird Cancer would like to think about the options and follow-up with CSW.   Update: Ms. Baird Cancer contacted CSW and expressed she would like patient to return home with [facility] services. She expressed Nanine Means [facility] is scheduled to come to the home Tuesday to continue PT services. Patient has a rolling walker with a seat and wheelchair. No further questions expressed at this time.  Expected Discharge Plan: [place] Barriers to Discharge: Continued Medical Work up   Patient Goals and CMS Choice   CMS Medicare.gov Compare Post Acute Care list provided to:: Patient Represenative (must comment) Solmon Ice) Choice offered to / list presented to : Adult Children  Expected Discharge Plan and Services Expected Discharge Plan: [place]       Living arrangements for the past 2 months: Single Family Home                                      Prior Living Arrangements/Services Living arrangements for the past 2 months: Single Family Home Lives with:: Adult Children Patient language and need for interpreter reviewed:: Yes Do you feel safe going back to the place where you live?: Yes      Need for Family  Participation in Patient Care: No (Comment) Care giver support system in place?: Yes (comment)   Criminal Activity/Legal Involvement Pertinent to Current Situation/Hospitalization: No - Comment as needed  Activities of Daily Living Home Assistive Devices/Equipment: Walker (specify type) ADL Screening (condition at time of admission) Patient's cognitive ability adequate to safely complete daily activities?: Yes Is the patient deaf or have difficulty hearing?: Yes Does the patient have difficulty seeing, even when wearing glasses/contacts?: Yes Does the patient have difficulty concentrating, remembering, or making decisions?: No Patient able to express need for assistance with ADLs?: Yes Does the patient have difficulty dressing or bathing?: No Independently performs ADLs?: Yes (appropriate for developmental age) Communication: Independent Dressing (OT): Independent Is this a change from baseline?: Pre-admission baseline Grooming: Needs assistance Is this a change from baseline?: Pre-admission baseline Feeding: Needs assistance Is this a change from baseline?: Pre-admission baseline Bathing: Needs assistance Is this a change from baseline?: Pre-admission baseline Toileting: Needs assistance Is this a change from baseline?: Pre-admission baseline In/Out Bed: Needs assistance Is this a change from baseline?: Pre-admission baseline Walks in Home: Needs assistance Is this a change from baseline?: Pre-admission baseline Does the patient have difficulty walking or climbing stairs?: Yes Weakness of Legs: Both Weakness of Arms/Hands: None  Permission Sought/Granted Permission sought to share information with : Family Supports Permission granted to share information with : Yes, Verbal  Permission Granted  Share Information with NAME: Solmon Ice     Permission granted to share info w Relationship: Daughter  Permission granted to share info w Contact Information: [phone]  Emotional  Assessment   Attitude/Demeanor/Rapport: Unable to Assess Affect (typically observed): Unable to Assess Orientation: : Oriented to Self, Oriented to Place, Oriented to  Time, Oriented to Situation Alcohol / Substance Use: Not Applicable Psych Involvement: No (comment)  Admission diagnosis:  New onset atrial fibrillation ([place]) [I48.91] Atrial fibrillation with rapid ventricular response (H[facility]) [I48.91] Atrial fibrillation with RVR ([place]) [I48.91] Patient Active Problem List   Diagnosis Date Noted  . Atrial fibrillation with RVR ([place]) 01/28/2020  . New onset atrial fibrillation ([place]) 01/28/2020  . History of subdural hematoma, 01/21/20 01/28/2020  . History of subarachnoid hemorrhage, 01/21/20 01/28/2020  . Atrial fibrillation with rapid ventricular response ([place]) 01/28/2020  . Epigastric pain 01/28/2020  . Cellulitis of right leg 01/21/2020  . Dementia without behavioral disturbance ([place]) 01/21/2020  . AMS (altered mental status) 01/20/2020  . Abnormal finding on MRI of brain 12/04/2019  . Confusion 12/04/2019  . Cerebral amyloid angiopathy ([place]) 12/03/2019  . Hypoalbuminemia 11/29/2019  . Anasarca 11/29/2019  . PVD (peripheral vascular disease) ([place]) 12/19/2018  . Mitral valve insufficiency 03/19/2017  . Other spondylosis with radiculopathy, cervical region 03/14/2017  . Cerumen impaction 12/04/2016  . Generalized weakness 09/22/2015  . Acidosis 09/22/2015  . Acute encephalopathy 09/22/2015  . Hyperglycemia 09/22/2015  . Leukopenia 09/22/2015  . CAP (community acquired pneumonia) 09/22/2015  . Bilateral leg edema 09/22/2015  . Constipation 09/22/2015  . Snoring 09/13/2015  . Glaucoma (increased eye pressure)   . Hypertension   . Neck pain   . Head pain   . Pain in joint, multiple sites   . Impaired glucose tolerance test   . Osteoarthrosis, unspecified whether generalized or localized, other specified sites   . Morbid obesity ([place])   . Medication management   . Other  lymphedema   . Insomnia, unspecified   . Chronic kidney disease, stage III (moderate)   . Primary open-angle glaucoma(365.11)    PCP:  Audley Hose, MD Pharmacy:   [id number] [place] [place]Phone: [phone]: [phone]    Social Determinants of Health (SDOH) Interventions    Readmission Risk Interventions No flowsheet data found.

## 2020-01-30 NOTE — Evaluation (Addendum)
Addendum:  After OT session, pt reports she would be open to SNF level rehab before going home. This combined with PT prior reluctance about getting into the house, cause change in PT recommendation to SNF level rehab prior to return home to improve strength and endurance. Thank you  Physical Therapy Evaluation Patient Details Name: [name] MRN: [id number] DOB: 25-Mar-1930 Today's Date: 01/30/2020   History of Present Illness  Pt is an 86 year old female admitted for acute metabolic encephalopathy with hallucinations superimposed on dementia, secondary to progressive cerebral amyloid angiopathy.  Clinical Impression  PTA pt living with daughter in single story home with 6 steps to enter. Pt reports ambulation with RW household distances, daughter assists with bathing and dressing and daughter performs iADLS. Pt is limited in safe mobility by decreased awareness of deficits and safety in presence of generalized weakness and decreased endurance. Pt takes increased time and effort for all mobility, however pt min guard for bed mobility and transfers and min A for ambulation of 12 feet with RW. PT with concerns about climbing 6 steps but pt reports she is able to complete with daughters assist. PT recommending HHPT rehab to improve strength and endurance for safe mobility in her home environment. PT will continue to follow acutely, however pt will likely d/c this afternoon.     Follow Up Recommendations SNF    Equipment Recommendations  None recommended by PT       Precautions / Restrictions Precautions Precautions: Fall      Mobility  Bed Mobility Overal bed mobility: Needs Assistance Bed Mobility: Supine to Sit     Supine to sit: Min guard     General bed mobility comments: increased time  Transfers Overall transfer level: Needs assistance Equipment used: Rolling walker (2 wheeled) Transfers: Sit to/from [facility] Sit to Stand: Min guard          General transfer comment: min guard for safety, increased time and effort required  Ambulation/Gait Ambulation/Gait assistance: Min assist Gait Distance (Feet): 18 Feet Assistive device: Rolling walker (2 wheeled) Gait Pattern/deviations: Decreased stride length;Step-through pattern;Shuffle;Trunk flexed Gait velocity: slowed Gait velocity interpretation: <1.31 ft/sec, indicative of household ambulator General Gait Details: ambulates with min A for steadying, extremely slow ambulation with mild instability no LoB or knee buckling. Pt reports close to baseline level of ambulation         Balance Overall balance assessment: Needs assistance Sitting-balance support: Feet supported;No upper extremity supported;Bilateral upper extremity supported;Single extremity supported Sitting balance-Leahy Scale: Fair     Standing balance support: Bilateral upper extremity supported Standing balance-Leahy Scale: Poor Standing balance comment: reliant on UE support                             Pertinent Vitals/Pain Pain Assessment: 0-10 Pain Score: 7  Pain Location: bilateral knees Pain Descriptors / Indicators: Aching;Sore Pain Intervention(s): Limited activity within patient's tolerance;Monitored during session;Repositioned    Home Living Family/patient expects to be discharged to:: Private residence Living Arrangements: Children Available Help at Discharge: Family;Available PRN/intermittently Type of Home: House Home Access: Stairs to enter Entrance Stairs-Rails: Right Entrance Stairs-Number of Steps: 6 Home Layout: One level Home Equipment: Walker - 2 wheels;Cane - single point;Grab bars - tub/shower;Shower seat;Walker - 4 wheels;Wheelchair - manual      Prior Function Level of Independence: Needs assistance   Gait / Transfers Assistance Needed: ambulates household distance with RW  ADL's / Homemaking  Assistance Needed: daughter assists with bathing and dressing as well  as iADLs  Comments: pt recently at SNF for rehab and currently receiving ST, OT, PT home health     Hand Dominance   Dominant Hand: Right    Extremity/Trunk Assessment   Upper Extremity Assessment Upper Extremity Assessment: Defer to OT evaluation    Lower Extremity Assessment Lower Extremity Assessment: Generalized weakness    Cervical / Trunk Assessment Cervical / Trunk Assessment: Kyphotic  Communication   Communication: HOH;Expressive difficulties  Cognition Arousal/Alertness: Awake/alert Behavior During Therapy: Flat affect Overall Cognitive Status: History of cognitive impairments - at baseline                                        General Comments General comments (skin integrity, edema, etc.): pt with increased edema in extremities, VSS BP 122/56, max HR with ambulation 88bpm, SaO2 on RA >90%        Assessment/Plan    PT Assessment Patient needs continued PT services  PT Problem List Decreased balance;Decreased range of motion;Decreased strength;Decreased mobility;Decreased knowledge of use of DME;Decreased activity tolerance;Decreased cognition;Decreased coordination;Decreased safety awareness       PT Treatment Interventions DME instruction;Gait training;Balance training;Therapeutic exercise;Functional mobility training;Therapeutic activities;Patient/family education    PT Goals (Current goals can be found in the Care Plan section)  Acute Rehab PT Goals PT Goal Formulation: With patient Time For Goal Achievement: 02/05/20 Potential to Achieve Goals: Good    Frequency Min 3X/week    AM-PAC PT "6 Clicks" Mobility  Outcome Measure Help needed turning from your back to your side while in a flat bed without using bedrails?: None Help needed moving from lying on your back to sitting on the side of a flat bed without using bedrails?: A Little Help needed moving to and from a bed to a chair (including a wheelchair)?: A Little Help needed  standing up from a chair using your arms (e.g., wheelchair or bedside chair)?: A Little Help needed to walk in hospital room?: A Little Help needed climbing 3-5 steps with a railing? : A Lot 6 Click Score: 18    End of Session Equipment Utilized During Treatment: Gait belt Activity Tolerance: Patient tolerated treatment well Patient left: in chair;with call bell/phone within reach;with chair alarm set;with family/visitor present Nurse Communication: Mobility status PT Visit Diagnosis: Other abnormalities of gait and mobility (R26.89)    Time: 8341-9622 PT Time Calculation (min) (ACUTE ONLY): 28 min   Charges:   PT Evaluation $PT Eval Moderate Complexity: 1 Mod PT Treatments $Gait Training: 8-22 mins        Nakema Fake B. Migdalia Dk PT, DPT Acute Rehabilitation Services Pager [phone] Office [phone]   [place] 01/30/2020, 11:21 AM

## 2020-01-30 NOTE — Discharge Summary (Signed)
Physician Discharge Summary  [name] NAT:[id number] DOB: November 19, 1929 DOA: 01/28/2020  PCP: Audley Hose, MD  Admit date: 01/28/2020 Discharge date: 01/30/2020  Admitted From: Home Disposition:  Home  Recommendations for Outpatient Follow-up:  1. Follow up with PCP in 1-2 weeks 2. Follow up with Dr. Terrence Dupont as scheduled  Home Health:PT, OT   Discharge Condition:Improved CODE STATUS:Full Diet recommendation: Heart healthy   Brief/Interim Summary: 86 y.o.femalewith medical history significant forHTN, recently diagnosed with progressive cerebral amyloid angiopathy, during hospitalization from 01/20/2020 to 01/23/2020 after presenting with AMS and hallucinations, with finding of small subarachnoid and subdural hematoma on MRI on 01/21/2020, treated with Solu-Medrol and discharged on prednisone and Protonix, who went to her PCPs office for posthospitalization follow-up with a complaint for abdominal pain, with work-up revealing rapid A. fib with rate in the 160s. She denied chest pain or shortness of breath, lightheadedness or palpitations. Abdominal pain was a one-time episode mostly in the upper abdomen of moderate to severe intensity and radiating to the back associated with decreased appetiteno associated nausea vomiting or change in bowel habit. No black or bloody stool. Was alleviated an hour later by drinking a warm Sprite  ED Course:On arrival, she appeared comfortable. Vitals were unremarkable however EKG showed A. fib with RVR of 166. Lipase was normal. BNP elevated at 375.5. Other blood work at baseline and unremarkable. Chest x-ray showed cardiomegaly without edema or infiltrate. Patient was treated with a diltiazem bolus and started on diltiazem infusion. Hospitalist consulted for admission.  Discharge Diagnoses:  Principal Problem:   Atrial fibrillation with rapid ventricular response (HCC) Active Problems:   Cerebral amyloid angiopathy (HCC)   New onset  atrial fibrillation (HCC)   History of subdural hematoma, 01/21/20   History of subarachnoid hemorrhage, 01/21/20   Epigastric pain  Atrial fibrillation with rapid ventricular response ([place]) New onset atrial fibrillation (HCC) -EKG with A. fib rate in the 160s -Was started on diltiazem infusion for rate control -CHA2DS2-VASc score of 4 but not a candidate for anticoagulation for stroke prevention given recent history of subdural hematoma and subarachnoid hemorrhage on MRI 01/21/2020 -Serial trop trending down, only minimally elevated, likely secondary to presenting RVR -Pt now rate controlled, will wean off cardizem gtt and transitioned to PO cardizem. Now in sinus, rate controlled -Discussed with primary Cardiologist, Dr. Terrence Dupont, who recommends amiodarone for rhythm control. Have started daily amiodarone 200mg   Abdominal/epigastric pain -Suspected secondary to recent Solu-Medrol/prednisone therapy for her diagnosed cerebral amyloid angiopathy  Cerebral amyloid angiopathy ([place]) -Patient completed a course of prednisone on the day of arrival and will be following up with neurology -She presented with altered mental status and hallucinations -Seems to be stable thus far  History of subdural hematoma, 01/21/20 History of subarachnoid hemorrhage, 01/21/20 -Appears to be neurologically stable -Will cont to hold off on therapeutic anticoagulation given recent intracranial hemorrhage   Discharge Instructions  Discharge Instructions    Amb referral to [facility]   Complete by: As directed      Allergies as of 01/30/2020      Reactions   Penicillins Hives   Has patient had a PCN reaction causing immediate rash, facial/tongue/throat swelling, SOB or lightheadedness with hypotension: No Has patient had a PCN reaction causing severe rash involving mucus membranes or skin necrosis: No Has patient had a PCN reaction that required hospitalization No Has patient had a PCN reaction  occurring within the last 10 years: No If all of the above answers are "NO", then may  proceed with Cephalosporin use.   Tetanus Toxoid Hives      Medication List    TAKE these medications   acetaminophen 500 MG tablet Commonly known as: TYLENOL Take 1 tablet (500 mg total) by mouth at bedtime.   amiodarone 200 MG tablet Commonly known as: PACERONE Take 1 tablet (200 mg total) by mouth daily. Start taking on: January 31, 2020   Artificial Tears 1 % ophthalmic solution Generic drug: carboxymethylcellulose Place 1 drop into both eyes 3 (three) times daily as needed (for lubercating eyes).   calcium carbonate 500 MG chewable tablet Commonly known as: Tums Chew 1 tablet (200 mg of elemental calcium total) by mouth daily.   [employer] Vitamin C 500 MG tablet Generic drug: ascorbic acid Take 1 tablet (500 mg total) by mouth daily.   docusate sodium 50 MG capsule Commonly known as: COLACE Take 1-2 capsules (50-100 mg total) by mouth daily as needed for mild constipation.   Ensure Max Protein Liqd Take 330 mLs (11 oz total) by mouth daily.   furosemide 20 MG tablet Commonly known as: LASIX Take 1 tablet (20 mg total) by mouth every Monday, Wednesday, and Friday. What changed: when to take this   gabapentin 100 MG capsule Commonly known as: NEURONTIN Take 1-2 capsules (100-200 mg total) by mouth See admin instructions. Take 100mg  in AM and 200mg  at bedtime   Garlic 6045 MG Caps Take 2 capsules (2,000 mg total) by mouth daily. Take 2 capsules to = 2000 mg   iron polysaccharides 150 MG capsule Commonly known as: NIFEREX Take 150 mg by mouth daily.   losartan 50 MG tablet Commonly known as: COZAAR Take 50 mg by mouth daily.   NON FORMULARY Diet Order: Regular NAS, Heart Healthy Diet, thin liquids.   pantoprazole 40 MG tablet Commonly known as: Protonix Take 1 tablet (40 mg total) by mouth daily.   predniSONE 20 MG tablet Commonly known as: DELTASONE Take 2 tablets (40 mg  total) by mouth daily with breakfast.   spironolactone 25 MG tablet Commonly known as: ALDACTONE Take 25 mg by mouth daily.   tetrahydrozoline 0.05 % ophthalmic solution Place 1 drop into both eyes daily as needed (eye irritation).   trolamine salicylate 10 % cream Commonly known as: ASPERCREME Apply 1 application topically as needed for muscle pain.   vitamin B-12 1000 MCG tablet Commonly known as: CYANOCOBALAMIN Take 1 tablet (1,000 mcg total) by mouth daily.       Follow-up Information    [place] [place] Follow up.   Specialty: Home Health Services Why: Registered Nurse and Physcial Therapy-office to call for visit times.  Contact information: [place] [address] [phone]        Audley Hose, MD. Schedule an appointment as soon as possible for a visit in 1 week(s).   Specialty: Internal Medicine Contact information: [place] [id number] [phone]              Allergies  Allergen Reactions  . Penicillins Hives    Has patient had a PCN reaction causing immediate rash, facial/tongue/throat swelling, SOB or lightheadedness with hypotension: No Has patient had a PCN reaction causing severe rash involving mucus membranes or skin necrosis: No Has patient had a PCN reaction that required hospitalization No Has patient had a PCN reaction occurring within the last 10 years: No If all of the above answers are "NO", then may proceed with Cephalosporin  use.  . Tetanus Toxoid Hives    Consultations:  Discussed with Dr. Terrence Dupont over phone  Procedures/Studies: EEG  Result Date: 01/22/2020 Lora Havens, MD     01/22/2020  1:36 PM Patient Name: [name] MRN: [id number] Epilepsy Attending: Lora Havens Referring Physician/Provider: Dr Ileene Musa Date: 01/22/2020 Duration: 24.54 minutes Patient history: 86 year old female with cerebral amyloid angiopathy presented with  altered mental status.  EEG evaluate for seizures. Level of alertness: Awake, drowsy, sleep, comatose, lethargic AEDs during EEG study: None Technical aspects: This EEG study was done with scalp electrodes positioned according to the 10-20 International system of electrode placement. Electrical activity was acquired at a sampling rate of 500Hz  and reviewed with a high frequency filter of 70Hz  and a low frequency filter of 1Hz . EEG data were recorded continuously and digitally stored. Description: The posterior dominant rhythm consists of 8Hz  activity of moderate voltage (25-35 uV) seen predominantly in posterior head regions, symmetric and reactive to eye opening and eye closing. EEG showed intermittent generalized 3 to 6 Hz theta-delta slowing. Triphasic waves, generalized and maximal bifrontal were also seen. Hyperventilation and photic stimulation were not performed.   ABNORMALITY -Intermittent slow, generalized -Triphasic waves, generalized IMPRESSION: This study is suggestive of mild diffuse encephalopathy, nonspecific etiology but could be related to toxic-metabolic etiology. No seizures or definite epileptiform discharges were seen throughout the recording. Lora Havens   CT HEAD WO CONTRAST  Result Date: 01/20/2020 CLINICAL DATA:  Altered mental status EXAM: CT HEAD WITHOUT CONTRAST TECHNIQUE: Contiguous axial images were obtained from the base of the skull through the vertex without intravenous contrast. COMPARISON:  MRI 11/22/2019, CT brain 10/28/2016 FINDINGS: Brain: White matter hypodensity/edema within the right parietal and temporal lobes. No significant mass effect. No midline shift. Questionable subtle hyperdensity at right frontal sulcus. Stable ventricle size. Vascular: No hyperdense vessels.  Carotid vascular calcification Skull: Normal. Negative for fracture or focal lesion. Sinuses/Orbits: No acute finding. Other: None IMPRESSION: 1. Similar pattern of white matter hypodensity/edema  within the right parietal and temporal lobes as compared with MRI from April 2021 2. Faint hyperdensity at the cortical surface of the right frontal lobe is questionable for a small amount of subarachnoid blood. Electronically Signed   By: Donavan Foil M.D.   On: 01/20/2020 23:11   MR BRAIN WO CONTRAST  Result Date: 01/21/2020 CLINICAL DATA:  Altered mental status.  Encephalopathy. EXAM: MRI HEAD WITHOUT CONTRAST TECHNIQUE: Multiplanar, multiecho pulse sequences of the brain and surrounding structures were obtained without intravenous contrast. COMPARISON:  CT head 01/20/2020. MRI head 11/22/2019, 11/19/2019, 11/18/2019. FINDINGS: Brain: Rapid sequence scanning performed. The patient was not able to complete the study. No postcontrast images obtained. There was IV infiltration. Patient refused further imaging. White matter hyperintensity in the right temporal lobe has progressed since prior MRI studies. White matter hyperintensity in the posterior parietal lobe is stable. 15 mm hyperintensity in the right frontal convexity white matter has progressed Hyperintensity in the left anterior occipital lobe has progressed since the prior study. High signal in the right frontal sulcus on FLAIR suggestive of recent hemorrhage is new since prior MRI. Small subdural fluid collection lateral the right temporal lobe may also represent small area of subdural hemorrhage. In addition, there are scattered areas of microhemorrhage in the posterior parietal lobe bilaterally and in the right temporal lobe unchanged. Vascular: Normal arterial flow voids. Skull and upper cervical spine: Negative Sinuses/Orbits: Mild mucosal edema left maxillary sinus. Bilateral cataract extraction. Other: None IMPRESSION:  1. Multiple areas of white matter edema with progression in the right temporal lobe, right high frontal lobe, and left anterior occipital lobe. Right posterior parietal lobe lesion appears similar in size. There is associated  microhemorrhage with these lesions. These lesions did not enhance on prior studies. Favor cerebral amyloid with inflammation. 2. Interval development of mild subarachnoid hemorrhage on the right as well as right temporal subdural hemorrhage. Electronically Signed   By: Franchot Gallo M.D.   On: 01/21/2020 10:12   DG Chest Port 1 View  Result Date: 01/28/2020 CLINICAL DATA:  New onset atrial fibrillation EXAM: PORTABLE CHEST 1 VIEW COMPARISON:  11/26/2019, 01/20/2020 FINDINGS: Mild cardiomegaly. Enlarged central pulmonary vessels as before. Aortic atherosclerosis. No consolidation, pleural effusion or pulmonary edema. No pneumothorax. IMPRESSION: Cardiomegaly without edema or infiltrate. Enlarged central pulmonary arteries suspect for pulmonary arterial hypertension. Electronically Signed   By: Donavan Foil M.D.   On: 01/28/2020 17:57   DG Chest Port 1 View  Result Date: 01/20/2020 CLINICAL DATA:  Confusion.  Altered mental status. EXAM: PORTABLE CHEST 1 VIEW COMPARISON:  Chest x-ray dated 11/26/2019 and CT angiogram of the chest dated 11/19/2018 FINDINGS: Heart size is normal. There is marked enlargement of the pulmonary arteries bilaterally, increased since the prior study. Aortic atherosclerosis. No discrete infiltrates or effusions. No acute bone abnormality. IMPRESSION: Marked enlargement of the pulmonary arteries bilaterally, increased since the prior study. Findings are consistent with marked pulmonary arterial hypertension. Aortic Atherosclerosis (ICD10-I70.0). Electronically Signed   By: Lorriane Shire M.D.   On: 01/20/2020 17:32   ECHOCARDIOGRAM COMPLETE  Result Date: 01/29/2020    ECHOCARDIOGRAM REPORT   Patient Name:   MAKAYELA SECREST Date of Exam: 01/29/2020 Medical Rec #:  [id number]      Height:       57.0 in Accession #:    [id number]     Weight:       160.0 lb Date of Birth:  11-15-1929     BSA:          1.636 m Patient Age:    30 years       BP:           157/79 mmHg Patient Gender: F               HR:           74 bpm. Exam Location:  Inpatient Procedure: 2D Echo Indications:    Atrial Fibrillation I48.91  History:        Patient has prior history of Echocardiogram examinations, most                 recent 09/23/2015. Risk Factors:Hypertension.  Sonographer:    Mikki Santee RDCS (AE) Referring Phys: [id number] [place]  1. Left ventricular ejection fraction, by estimation, is 55 to 60%. The left ventricle has normal function. The left ventricle has no regional wall motion abnormalities. Left ventricular diastolic parameters are indeterminate.  2. Right ventricular systolic function is normal. The right ventricular size is mildly enlarged. There is severely elevated pulmonary artery systolic pressure. The estimated right ventricular systolic pressure is 60.1 mmHg.  3. Left atrial size was mild to moderately dilated.  4. There is a left to right shunt at the atrial level, likely represents patent foramen ovale given patient's age and overall normal size and function of right ventricle. . Evidence of atrial level shunting detected by color flow Doppler.  5. Right atrial size was mild to moderately dilated.  6. The mitral valve is degenerative. Moderate to severe mitral valve regurgitation. No evidence of mitral stenosis.  7. The tricuspid valve is degenerative. Tricuspid valve regurgitation is moderate to severe.  8. The aortic valve is abnormal. Aortic valve regurgitation is trivial. No aortic stenosis is present. FINDINGS  Left Ventricle: Left ventricular ejection fraction, by estimation, is 55 to 60%. The left ventricle has normal function. The left ventricle has no regional wall motion abnormalities. The left ventricular internal cavity size was normal in size. There is  no left ventricular hypertrophy. Left ventricular diastolic parameters are indeterminate. Right Ventricle: The right ventricular size is mildly enlarged. No increase in right ventricular wall thickness. Right  ventricular systolic function is normal. There is severely elevated pulmonary artery systolic pressure. The tricuspid regurgitant velocity is 3.43 m/s, and with an assumed right atrial pressure of 15 mmHg, the estimated right ventricular systolic pressure is 43.1 mmHg. Left Atrium: Left atrial size was mild to moderately dilated. Right Atrium: Right atrial size was mild to moderately dilated. Pericardium: There is no evidence of pericardial effusion. Mitral Valve: The mitral valve is degenerative in appearance. Moderate to severe mitral valve regurgitation. No evidence of mitral valve stenosis. Tricuspid Valve: The tricuspid valve is degenerative in appearance. Tricuspid valve regurgitation is moderate to severe. Aortic Valve: The aortic valve is abnormal. . There is mild thickening and mild calcification of the aortic valve. Aortic valve regurgitation is trivial. Aortic regurgitation PHT measures 517 msec. No aortic stenosis is present. There is mild thickening of the aortic valve. There is mild calcification of the aortic valve. Pulmonic Valve: The pulmonic valve was grossly normal. Pulmonic valve regurgitation is mild. Aorta: The aortic root and ascending aorta are structurally normal, with no evidence of dilitation. IAS/Shunts: Evidence of atrial level shunting detected by color flow Doppler.  LEFT VENTRICLE PLAX 2D LVIDd:         4.10 cm LVIDs:         3.00 cm LV PW:         1.00 cm LV IVS:        1.00 cm LVOT diam:     2.00 cm LV SV:         53 LV SV Index:   32 LVOT Area:     3.14 cm  RIGHT VENTRICLE RV S prime:     8.27 cm/s TAPSE (M-mode): 1.2 cm LEFT ATRIUM             Index       RIGHT ATRIUM           Index LA diam:        4.40 cm 2.69 cm/m  RA Area:     13.80 cm LA Vol (A2C):   54.3 ml 33.19 ml/m RA Volume:   31.40 ml  19.19 ml/m LA Vol (A4C):   48.0 ml 29.34 ml/m LA Biplane Vol: 53.8 ml 32.89 ml/m  AORTIC VALVE LVOT Vmax:   80.00 cm/s LVOT Vmean:  56.067 cm/s LVOT VTI:    0.167 m AI PHT:       517 msec  AORTA Ao Root diam: 3.05 cm Ao Asc diam:  3.10 cm MR Peak grad:    117.1 mmHg  TRICUSPID VALVE MR Mean grad:    79.0 mmHg   TR Peak grad:   47.1 mmHg MR Vmax:         541.00 cm/s TR Vmax:        343.00 cm/s MR Vmean:  424.0 cm/s MR PISA:         3.08 cm    SHUNTS MR PISA Eff ROA: 18 mm      Systemic VTI:  0.17 m MR PISA Radius:  0.70 cm     Systemic Diam: 2.00 cm Cherlynn Kaiser MD Electronically signed by Cherlynn Kaiser MD Signature Date/Time: 01/29/2020/4:46:31 PM    Final      Subjective: Eager to go home  Discharge Exam: Vitals:   01/30/20 1100 01/30/20 1250  BP: (!) 114/52 (!) 116/54  Pulse:  (!) 58  Resp: 16 16  Temp:  98.8 F (37.1 C)  SpO2:  98%   Vitals:   01/30/20 0800 01/30/20 1000 01/30/20 1100 01/30/20 1250  BP:  (!) 128/53 (!) 114/52 (!) 116/54  Pulse: 66   (!) 58  Resp: 18 (!) 21 16 16   Temp:    98.8 F (37.1 C)  TempSrc:    Axillary  SpO2: 100%   98%  Weight:      Height:        General: Pt is alert, awake, not in acute distress Cardiovascular: RRR, S1/S2 +, no rubs, no gallops Respiratory: CTA bilaterally, no wheezing, no rhonchi Abdominal: Soft, NT, ND, bowel sounds + Extremities: no edema, no cyanosis   The results of significant diagnostics from this hospitalization (including imaging, microbiology, ancillary and laboratory) are listed below for reference.     Microbiology: Recent Results (from the past 240 hour(s))  Blood culture (routine x 2)     Status: None   Collection Time: 01/20/20  3:40 PM   Specimen: BLOOD LEFT ARM  Result Value Ref Range Status   Specimen Description   Final    BLOOD LEFT ARM Performed at [facility], [address] [place], [place] [address]    Special Requests   Final    BOTTLES DRAWN AEROBIC AND ANAEROBIC Blood Culture adequate volume Performed at [facility] [address] [place], [place] [address]    Culture   Final    NO GROWTH 5 DAYS Performed at [facility], [place] [address], [place], [place] [address]    Report Status 01/25/2020 FINAL  Final  Urine culture     Status: Abnormal   Collection Time: 01/20/20  4:57 PM   Specimen: Urine, Clean Catch  Result Value Ref Range Status   Specimen Description   Final    URINE, CLEAN CATCH Performed at [facility], [place] [address], [place], [place] [address]    Special Requests   Final    NONE Performed at [facility], [place] [address], [place], [place] [address]    Culture MULTIPLE SPECIES PRESENT, SUGGEST RECOLLECTION (A)  Final   Report Status 01/21/2020 FINAL  Final  SARS Coronavirus 2 by RT PCR (hospital order, performed in [facility] hospital lab) Nasopharyngeal Nasopharyngeal Swab     Status: None   Collection Time: 01/20/20  5:59 PM   Specimen: Nasopharyngeal Swab  Result Value Ref Range Status   SARS Coronavirus 2 NEGATIVE NEGATIVE Final    Comment: (NOTE) SARS-CoV-2 target nucleic acids are NOT DETECTED.  The SARS-CoV-2 RNA is generally detectable in upper and lower respiratory specimens during the acute phase of infection. The lowest concentration of SARS-CoV-2 viral copies this assay can detect is 250 copies / mL. A negative result does not preclude SARS-CoV-2 infection and should not be used as the sole basis for treatment or other patient  management decisions.  A negative result may occur with improper specimen collection / handling, submission of specimen other than nasopharyngeal swab, presence of viral mutation(s) within the areas targeted by this assay, and inadequate number of viral copies (<250 copies / mL). A negative result must be combined with clinical observations, patient history, and epidemiological information.  Fact Sheet for Patients:   StrictlyIdeas.no  Fact Sheet for Healthcare Providers: BankingDealers.co.za  This test is not yet approved or  cleared by the [place] FDA and has been authorized for detection and/or diagnosis of SARS-CoV-2 by FDA under an Emergency Use Authorization (EUA).  This EUA will remain in effect (meaning this test can be used) for the duration of the COVID-19 declaration under Section 564(b)(1) of the Act, 21 U.S.C. section 360bbb-3(b)(1), unless the authorization is terminated or revoked sooner.  Performed at [facility], [place] [address], [place], [place] [address]   SARS Coronavirus 2 by RT PCR (hospital order, performed in [facility] hospital lab) Nasopharyngeal Nasopharyngeal Swab     Status: None   Collection Time: 01/28/20  5:53 PM   Specimen: Nasopharyngeal Swab  Result Value Ref Range Status   SARS Coronavirus 2 NEGATIVE NEGATIVE Final    Comment: (NOTE) SARS-CoV-2 target nucleic acids are NOT DETECTED.  The SARS-CoV-2 RNA is generally detectable in upper and lower respiratory specimens during the acute phase of infection. The lowest concentration of SARS-CoV-2 viral copies this assay can detect is 250 copies / mL. A negative result does not preclude SARS-CoV-2 infection and should not be used as the sole basis for treatment or other patient management decisions.  A negative result may occur with improper specimen collection / handling, submission of specimen other than nasopharyngeal swab, presence of viral mutation(s) within the areas targeted by this assay, and inadequate number of viral copies (<250 copies / mL). A negative result must be combined with clinical observations, patient history, and epidemiological information.  Fact Sheet for Patients:   StrictlyIdeas.no  Fact Sheet for Healthcare Providers: BankingDealers.co.za  This test is not yet approved or  cleared by the [place] FDA and has been authorized for detection and/or diagnosis of SARS-CoV-2 by FDA under an Emergency Use Authorization (EUA).  This EUA will  remain in effect (meaning this test can be used) for the duration of the COVID-19 declaration under Section 564(b)(1) of the Act, 21 U.S.C. section 360bbb-3(b)(1), unless the authorization is terminated or revoked sooner.  Performed at [facility], [place] [address] [place], [place] [address]      Labs: BNP (last 3 results) Recent Labs    11/18/19 1405 01/28/20 1703  BNP 296.3* 643.3*   Basic Metabolic Panel: Recent Labs  Lab 01/28/20 1703 01/29/20 0500  NA 139 140  K 3.9 3.6  CL 102 104  CO2 28 25  GLUCOSE 160* 132*  BUN 38* 33*  CREATININE 1.00 1.00  CALCIUM 9.0 8.7*   Liver Function Tests: Recent Labs  Lab 01/28/20 1703  AST 19  ALT 17  ALKPHOS 63  BILITOT 0.6  PROT 5.5*  ALBUMIN 2.7*   Recent Labs  Lab 01/28/20 1703  LIPASE 27   No results for input(s): AMMONIA in the last 168 hours. CBC: Recent Labs  Lab 01/28/20 1703 01/29/20 0500  WBC 8.2 7.9  HGB 10.2* 11.1*  HCT 34.2* 37.5  MCV 81.4 84.7  PLT 318 298   Cardiac Enzymes: No results for input(s): CKTOTAL, CKMB, CKMBINDEX, TROPONINI in the last 168  hours. BNP: Invalid input(s): POCBNP CBG: No results for input(s): GLUCAP in the last 168 hours. D-Dimer No results for input(s): DDIMER in the last 72 hours. Hgb A1c No results for input(s): HGBA1C in the last 72 hours. Lipid Profile Recent Labs    01/29/20 0500  CHOL 141  HDL 71  LDLCALC 61  TRIG 46  CHOLHDL 2.0   Thyroid function studies No results for input(s): TSH, T4TOTAL, T3FREE, THYROIDAB in the last 72 hours.  Invalid input(s): FREET3 Anemia work up No results for input(s): VITAMINB12, FOLATE, FERRITIN, TIBC, IRON, RETICCTPCT in the last 72 hours. Urinalysis    Component Value Date/Time   COLORURINE YELLOW 01/20/2020 1657   APPEARANCEUR CLEAR 01/20/2020 1657   LABSPEC 1.012 01/20/2020 1657   PHURINE 6.0 01/20/2020 1657   GLUCOSEU NEGATIVE 01/20/2020 1657   HGBUR SMALL (A) 01/20/2020 1657   BILIRUBINUR  NEGATIVE 01/20/2020 1657   KETONESUR NEGATIVE 01/20/2020 1657   PROTEINUR NEGATIVE 01/20/2020 1657   NITRITE NEGATIVE 01/20/2020 1657   LEUKOCYTESUR NEGATIVE 01/20/2020 1657   Sepsis Labs Invalid input(s): PROCALCITONIN,  WBC,  LACTICIDVEN Microbiology Recent Results (from the past 240 hour(s))  Blood culture (routine x 2)     Status: None   Collection Time: 01/20/20  3:40 PM   Specimen: BLOOD LEFT ARM  Result Value Ref Range Status   Specimen Description   Final    BLOOD LEFT ARM Performed at [facility], [place] [address] [place], [place] [address]    Special Requests   Final    BOTTLES DRAWN AEROBIC AND ANAEROBIC Blood Culture adequate volume Performed at [place] [address] [place], [place] [address]    Culture   Final    NO GROWTH 5 DAYS Performed at [facility], [place] [address] [place], [place] [address]    Report Status 01/25/2020 FINAL  Final  Urine culture     Status: Abnormal   Collection Time: 01/20/20  4:57 PM   Specimen: Urine, Clean Catch  Result Value Ref Range Status   Specimen Description   Final    URINE, CLEAN CATCH Performed at [facility], [place] [address] [place], [place] [address]    Special Requests   Final    NONE Performed at [facility], [place] [address], [place], [place] [address]    Culture MULTIPLE SPECIES PRESENT, SUGGEST RECOLLECTION (A)  Final   Report Status 01/21/2020 FINAL  Final  SARS Coronavirus 2 by RT PCR (hospital order, performed in [facility] hospital lab) Nasopharyngeal Nasopharyngeal Swab     Status: None   Collection Time: 01/20/20  5:59 PM   Specimen: Nasopharyngeal Swab  Result Value Ref Range Status   SARS Coronavirus 2 NEGATIVE NEGATIVE Final    Comment: (NOTE) SARS-CoV-2 target nucleic acids are NOT DETECTED.  The SARS-CoV-2 RNA is generally detectable in upper and lower respiratory specimens during the acute phase of infection. The  lowest concentration of SARS-CoV-2 viral copies this assay can detect is 250 copies / mL. A negative result does not preclude SARS-CoV-2 infection and should not be used as the sole basis for treatment or other patient management decisions.  A negative result may occur with improper specimen collection / handling, submission of specimen other than nasopharyngeal swab, presence of viral mutation(s) within the areas targeted by this assay, and inadequate number of viral copies (<250 copies / mL). A negative result must be combined with clinical observations, patient history, and epidemiological  information.  Fact Sheet for Patients:   StrictlyIdeas.no  Fact Sheet for Healthcare Providers: BankingDealers.co.za  This test is not yet approved or  cleared by the [place] FDA and has been authorized for detection and/or diagnosis of SARS-CoV-2 by FDA under an Emergency Use Authorization (EUA).  This EUA will remain in effect (meaning this test can be used) for the duration of the COVID-19 declaration under Section 564(b)(1) of the Act, 21 U.S.C. section 360bbb-3(b)(1), unless the authorization is terminated or revoked sooner.  Performed at [facility], [place] [address] [place], [place] [address]   SARS Coronavirus 2 by RT PCR (hospital order, performed in [facility] hospital lab) Nasopharyngeal Nasopharyngeal Swab     Status: None   Collection Time: 01/28/20  5:53 PM   Specimen: Nasopharyngeal Swab  Result Value Ref Range Status   SARS Coronavirus 2 NEGATIVE NEGATIVE Final    Comment: (NOTE) SARS-CoV-2 target nucleic acids are NOT DETECTED.  The SARS-CoV-2 RNA is generally detectable in upper and lower respiratory specimens during the acute phase of infection. The lowest concentration of SARS-CoV-2 viral copies this assay can detect is 250 copies / mL. A negative result does not preclude SARS-CoV-2 infection and  should not be used as the sole basis for treatment or other patient management decisions.  A negative result may occur with improper specimen collection / handling, submission of specimen other than nasopharyngeal swab, presence of viral mutation(s) within the areas targeted by this assay, and inadequate number of viral copies (<250 copies / mL). A negative result must be combined with clinical observations, patient history, and epidemiological information.  Fact Sheet for Patients:   StrictlyIdeas.no  Fact Sheet for Healthcare Providers: BankingDealers.co.za  This test is not yet approved or  cleared by the [place] FDA and has been authorized for detection and/or diagnosis of SARS-CoV-2 by FDA under an Emergency Use Authorization (EUA).  This EUA will remain in effect (meaning this test can be used) for the duration of the COVID-19 declaration under Section 564(b)(1) of the Act, 21 U.S.C. section 360bbb-3(b)(1), unless the authorization is terminated or revoked sooner.  Performed at [facility], [place] [address], [place], [place] [address]    Time spent: 30 min  SIGNED:   Marylu Lund, MD  [facility] 01/30/2020, 3:20 PM  If 7PM-7AM, please contact night-coverage

## 2020-01-30 NOTE — Progress Notes (Signed)
Occupational Therapy Evaluation Patient Details Name: [name] MRN: [id number] DOB: 10-Dec-1929 Today's Date: 01/30/2020    History of Present Illness Pt is an 86 year old female admitted for acute metabolic encephalopathy with hallucinations superimposed on dementia, secondary to progressive cerebral amyloid angiopathy.   Clinical Impression   Patient lives with daughter in a single level home with 6 steps to enter.  Prior to admission patient able to use RW for in home mobility.  Requires assist with bathing, dressing, and IADLs.  Today patient presenting with decreased strength and balance, as well as decreased safety awareness and processing speed.  Patient needing increased cueing for proper use of walker.  Requiring rest breaks after in room mobility indicating poor activity tolerance.  Able to complete UB ADLs seated with set up and min guard-min assist for LB ADLs.  Patient moves very slowly.  Would benefit from a short stay at SNF to increase strength and activity tolerance for improved safety at home.  Will continue to follow with OT acutely to address the deficits listed below.      Follow Up Recommendations  SNF;Supervision/Assistance - 24 hour    Equipment Recommendations  None recommended by OT    Recommendations for Other Services       Precautions / Restrictions Precautions Precautions: Fall      Mobility Bed Mobility Overal bed mobility: Needs Assistance Bed Mobility: Supine to Sit     Supine to sit: Min guard     General bed mobility comments: In chair on arrival  Transfers Overall transfer level: Needs assistance Equipment used: Rolling walker (2 wheeled) Transfers: Sit to/from [facility] Sit to Stand: Min guard Stand pivot transfers: Min guard       General transfer comment: min guard for safety, increased time and effort required    Balance Overall balance assessment: Needs assistance Sitting-balance support: Feet  supported;No upper extremity supported;Bilateral upper extremity supported;Single extremity supported Sitting balance-Leahy Scale: Fair     Standing balance support: Bilateral upper extremity supported Standing balance-Leahy Scale: Poor Standing balance comment: reliant on UE support, leans on counter top when standing at sink                           ADL either performed or assessed with clinical judgement   ADL Overall ADL's : Needs assistance/impaired Eating/Feeding: Set up;Sitting   Grooming: Min guard;Standing   Upper Body Bathing: Set up;Sitting   Lower Body Bathing: Minimal assistance;Sit to/from stand   Upper Body Dressing : Set up;Sitting   Lower Body Dressing: Minimal assistance;Sit to/from stand   Toilet Transfer: Min guard;Ambulation;Regular Toilet;RW   Toileting- Clothing Manipulation and Hygiene: Minimal assistance;Sit to/from stand       Functional mobility during ADLs: Min guard;Rolling walker General ADL Comments: Moves very slowly. Verbal cues for safety with walker use during standing ADLs.     Vision Baseline Vision/History: Cataracts (L eye vision is poor) Patient Visual Report: No change from baseline       Perception     Praxis      Pertinent Vitals/Pain Pain Assessment: Faces  Faces Pain Scale: Hurts little more Pain Location: bilateral knees Pain Descriptors / Indicators: Aching;Sore Pain Intervention(s): Limited activity within patient's tolerance;Monitored during session;Repositioned     Hand Dominance Right   Extremity/Trunk Assessment Upper Extremity Assessment Upper Extremity Assessment: Generalized weakness   Lower Extremity Assessment Lower Extremity Assessment: Generalized weakness   Cervical / Trunk Assessment Cervical /  Trunk Assessment: Kyphotic   Communication Communication Communication: HOH;Expressive difficulties   Cognition Arousal/Alertness: Awake/alert Behavior During Therapy: Flat  affect Overall Cognitive Status: History of cognitive impairments - at baseline                                 General Comments: Patient able to follow simple commands.  She demonstrates poor safety awareness and often changed answers to questions, tho could be due to [facility].  Processing speed is reduced   General Comments  Has increased edema in all extremities.  BP 114/52 after stand.      Exercises     Shoulder Instructions      Home Living Family/patient expects to be discharged to:: Private residence Living Arrangements: Children Available Help at Discharge: Family;Available PRN/intermittently Type of Home: House Home Access: Stairs to enter [employer] of Steps: 6 Entrance Stairs-Rails: Right Home Layout: One level     Bathroom Shower/Tub: Tub/shower unit         Home Equipment: Environmental consultant - 2 wheels;Cane - single point;Grab bars - tub/shower;Shower seat;Walker - 4 wheels;Wheelchair - manual          Prior Functioning/Environment Level of Independence: Needs assistance  Gait / Transfers Assistance Needed: ambulates household distance with RW ADL's / Homemaking Assistance Needed: daughter assists with bathing and dressing as well as iADLs. Reports doing sponge baths Communication / Swallowing Assistance Needed: HOH  Comments: pt recently at SNF for rehab and currently receiving ST, OT, PT home health        OT Problem List: Decreased strength;Decreased activity tolerance;Impaired balance (sitting and/or standing);Impaired vision/perception;Decreased cognition;Decreased safety awareness;Decreased knowledge of use of DME or AE;Increased edema      OT Treatment/Interventions: Self-care/ADL training;Therapeutic exercise;Energy conservation;DME and/or AE instruction;Therapeutic activities;Cognitive remediation/compensation;Patient/family education;Balance training;Visual/perceptual remediation/compensation    OT Goals(Current goals can be found in  the care plan section) Acute Rehab OT Goals Patient Stated Goal: To get stronger before going home OT Goal Formulation: With patient Time For Goal Achievement: 02/13/20 Potential to Achieve Goals: Good  OT Frequency: Min 2X/week   Barriers to D/C: Decreased caregiver support  Has 6 steps to enter house and with decreased strength/balance this will be difficult.         Co-evaluation              AM-PAC OT "6 Clicks" Daily Activity     Outcome Measure Help from another person eating meals?: A Little Help from another person taking care of personal grooming?: A Little Help from another person toileting, which includes using toliet, bedpan, or urinal?: A Little Help from another person bathing (including washing, rinsing, drying)?: A Little Help from another person to put on and taking off regular upper body clothing?: A Little Help from another person to put on and taking off regular lower body clothing?: A Little 6 Click Score: 18   End of Session Equipment Utilized During Treatment: Gait belt;Rolling walker Nurse Communication: Mobility status  Activity Tolerance: Patient tolerated treatment well Patient left: in chair;with call bell/phone within reach;with chair alarm set  OT Visit Diagnosis: Unsteadiness on feet (R26.81);Muscle weakness (generalized) (M62.81);Other symptoms and signs involving cognitive function                Time: 1011-1107 OT Time Calculation (min): 56 min Charges:  OT General Charges $OT Visit: 1 Visit OT Evaluation $OT Eval Moderate Complexity: 1 Mod OT Treatments $Self Care/Home Management :  38-52 mins  August Luz, OTR/L   Phylliss Bob 01/30/2020, 12:50 PM

## 2020-01-30 NOTE — Care Management Obs Status (Signed)
[facility] NOTIFICATION   Patient Details  Name: VENDA DICE MRN: [id number] Date of Birth: 01-Jul-1930   Medicare Observation Status Notification Given:  Yes    [place], Merida, RN 01/30/2020, 8:08 AM

## 2020-01-30 NOTE — Progress Notes (Signed)
Visited with patient per her [name] request.  Patient sitting in recliner and talkative. Patient said she was feeling very good. Patient was very pleasant and welcoming. Provided emotional support, prayer and ministry of presence.  Chaplain will follow as needed.  Jaclynn Major, [place], [facility], Pager [phone]

## 2020-02-03 DIAGNOSIS — G9341 Metabolic encephalopathy: Secondary | ICD-10-CM | POA: Diagnosis not present

## 2020-02-03 DIAGNOSIS — M4722 Other spondylosis with radiculopathy, cervical region: Secondary | ICD-10-CM | POA: Diagnosis not present

## 2020-02-03 DIAGNOSIS — I890 Lymphedema, not elsewhere classified: Secondary | ICD-10-CM | POA: Diagnosis not present

## 2020-02-03 DIAGNOSIS — M1990 Unspecified osteoarthritis, unspecified site: Secondary | ICD-10-CM | POA: Diagnosis not present

## 2020-02-03 DIAGNOSIS — Z7982 Long term (current) use of aspirin: Secondary | ICD-10-CM | POA: Diagnosis not present

## 2020-02-03 DIAGNOSIS — H4010X0 Unspecified open-angle glaucoma, stage unspecified: Secondary | ICD-10-CM | POA: Diagnosis not present

## 2020-02-03 DIAGNOSIS — I340 Nonrheumatic mitral (valve) insufficiency: Secondary | ICD-10-CM | POA: Diagnosis not present

## 2020-02-03 DIAGNOSIS — I129 Hypertensive chronic kidney disease with stage 1 through stage 4 chronic kidney disease, or unspecified chronic kidney disease: Secondary | ICD-10-CM | POA: Diagnosis not present

## 2020-02-03 DIAGNOSIS — N183 Chronic kidney disease, stage 3 unspecified: Secondary | ICD-10-CM | POA: Diagnosis not present

## 2020-02-03 DIAGNOSIS — I739 Peripheral vascular disease, unspecified: Secondary | ICD-10-CM | POA: Diagnosis not present

## 2020-02-03 DIAGNOSIS — I700 Atherosclerosis of aorta: Secondary | ICD-10-CM | POA: Diagnosis not present

## 2020-02-05 DIAGNOSIS — Z7982 Long term (current) use of aspirin: Secondary | ICD-10-CM | POA: Diagnosis not present

## 2020-02-05 DIAGNOSIS — I340 Nonrheumatic mitral (valve) insufficiency: Secondary | ICD-10-CM | POA: Diagnosis not present

## 2020-02-05 DIAGNOSIS — M1990 Unspecified osteoarthritis, unspecified site: Secondary | ICD-10-CM | POA: Diagnosis not present

## 2020-02-05 DIAGNOSIS — I739 Peripheral vascular disease, unspecified: Secondary | ICD-10-CM | POA: Diagnosis not present

## 2020-02-05 DIAGNOSIS — I890 Lymphedema, not elsewhere classified: Secondary | ICD-10-CM | POA: Diagnosis not present

## 2020-02-05 DIAGNOSIS — I129 Hypertensive chronic kidney disease with stage 1 through stage 4 chronic kidney disease, or unspecified chronic kidney disease: Secondary | ICD-10-CM | POA: Diagnosis not present

## 2020-02-05 DIAGNOSIS — M4722 Other spondylosis with radiculopathy, cervical region: Secondary | ICD-10-CM | POA: Diagnosis not present

## 2020-02-05 DIAGNOSIS — H4010X0 Unspecified open-angle glaucoma, stage unspecified: Secondary | ICD-10-CM | POA: Diagnosis not present

## 2020-02-05 DIAGNOSIS — N183 Chronic kidney disease, stage 3 unspecified: Secondary | ICD-10-CM | POA: Diagnosis not present

## 2020-02-05 DIAGNOSIS — I700 Atherosclerosis of aorta: Secondary | ICD-10-CM | POA: Diagnosis not present

## 2020-02-05 DIAGNOSIS — G9341 Metabolic encephalopathy: Secondary | ICD-10-CM | POA: Diagnosis not present

## 2020-02-06 DIAGNOSIS — R7303 Prediabetes: Secondary | ICD-10-CM | POA: Diagnosis not present

## 2020-02-06 DIAGNOSIS — I480 Paroxysmal atrial fibrillation: Secondary | ICD-10-CM | POA: Diagnosis not present

## 2020-02-06 DIAGNOSIS — I609 Nontraumatic subarachnoid hemorrhage, unspecified: Secondary | ICD-10-CM | POA: Diagnosis not present

## 2020-02-06 DIAGNOSIS — E785 Hyperlipidemia, unspecified: Secondary | ICD-10-CM | POA: Diagnosis not present

## 2020-02-06 DIAGNOSIS — I10 Essential (primary) hypertension: Secondary | ICD-10-CM | POA: Diagnosis not present

## 2020-02-10 DIAGNOSIS — G9341 Metabolic encephalopathy: Secondary | ICD-10-CM | POA: Diagnosis not present

## 2020-02-10 DIAGNOSIS — I340 Nonrheumatic mitral (valve) insufficiency: Secondary | ICD-10-CM | POA: Diagnosis not present

## 2020-02-10 DIAGNOSIS — I739 Peripheral vascular disease, unspecified: Secondary | ICD-10-CM | POA: Diagnosis not present

## 2020-02-10 DIAGNOSIS — I700 Atherosclerosis of aorta: Secondary | ICD-10-CM | POA: Diagnosis not present

## 2020-02-10 DIAGNOSIS — M1990 Unspecified osteoarthritis, unspecified site: Secondary | ICD-10-CM | POA: Diagnosis not present

## 2020-02-10 DIAGNOSIS — I890 Lymphedema, not elsewhere classified: Secondary | ICD-10-CM | POA: Diagnosis not present

## 2020-02-10 DIAGNOSIS — M4722 Other spondylosis with radiculopathy, cervical region: Secondary | ICD-10-CM | POA: Diagnosis not present

## 2020-02-10 DIAGNOSIS — Z7982 Long term (current) use of aspirin: Secondary | ICD-10-CM | POA: Diagnosis not present

## 2020-02-10 DIAGNOSIS — N183 Chronic kidney disease, stage 3 unspecified: Secondary | ICD-10-CM | POA: Diagnosis not present

## 2020-02-10 DIAGNOSIS — H4010X0 Unspecified open-angle glaucoma, stage unspecified: Secondary | ICD-10-CM | POA: Diagnosis not present

## 2020-02-10 DIAGNOSIS — I129 Hypertensive chronic kidney disease with stage 1 through stage 4 chronic kidney disease, or unspecified chronic kidney disease: Secondary | ICD-10-CM | POA: Diagnosis not present

## 2020-02-12 DIAGNOSIS — I129 Hypertensive chronic kidney disease with stage 1 through stage 4 chronic kidney disease, or unspecified chronic kidney disease: Secondary | ICD-10-CM | POA: Diagnosis not present

## 2020-02-12 DIAGNOSIS — I340 Nonrheumatic mitral (valve) insufficiency: Secondary | ICD-10-CM | POA: Diagnosis not present

## 2020-02-12 DIAGNOSIS — I700 Atherosclerosis of aorta: Secondary | ICD-10-CM | POA: Diagnosis not present

## 2020-02-12 DIAGNOSIS — N183 Chronic kidney disease, stage 3 unspecified: Secondary | ICD-10-CM | POA: Diagnosis not present

## 2020-02-12 DIAGNOSIS — I739 Peripheral vascular disease, unspecified: Secondary | ICD-10-CM | POA: Diagnosis not present

## 2020-02-12 DIAGNOSIS — Z7982 Long term (current) use of aspirin: Secondary | ICD-10-CM | POA: Diagnosis not present

## 2020-02-12 DIAGNOSIS — G9341 Metabolic encephalopathy: Secondary | ICD-10-CM | POA: Diagnosis not present

## 2020-02-12 DIAGNOSIS — I890 Lymphedema, not elsewhere classified: Secondary | ICD-10-CM | POA: Diagnosis not present

## 2020-02-12 DIAGNOSIS — M1990 Unspecified osteoarthritis, unspecified site: Secondary | ICD-10-CM | POA: Diagnosis not present

## 2020-02-12 DIAGNOSIS — H4010X0 Unspecified open-angle glaucoma, stage unspecified: Secondary | ICD-10-CM | POA: Diagnosis not present

## 2020-02-12 DIAGNOSIS — Z0001 Encounter for general adult medical examination with abnormal findings: Secondary | ICD-10-CM | POA: Diagnosis not present

## 2020-02-12 DIAGNOSIS — M4722 Other spondylosis with radiculopathy, cervical region: Secondary | ICD-10-CM | POA: Diagnosis not present

## 2020-02-12 DIAGNOSIS — I10 Essential (primary) hypertension: Secondary | ICD-10-CM | POA: Diagnosis not present

## 2020-02-17 ENCOUNTER — Other Ambulatory Visit (INDEPENDENT_AMBULATORY_CARE_PROVIDER_SITE_OTHER): Payer: Self-pay | Admitting: Primary Care

## 2020-02-17 NOTE — Telephone Encounter (Signed)
Requested medication (s) are due for refill today: Yes  Requested medication (s) are on the active medication list: Yes  Last refill:  01/23/20  Future visit scheduled: Yes  Notes to clinic:  Unable to refill per protocol due to last refill by another provider     Requested Prescriptions  Pending Prescriptions Disp Refills   predniSONE (DELTASONE) 20 MG tablet [Pharmacy Med Name: PREDNISONE 20 MG TABLET] 60 tablet 0    Sig: Take 2 tablets (40 mg total) by mouth daily with breakfast.      Not Delegated - Endocrinology:  Oral Corticosteroids Failed - 02/17/2020  1:12 PM      Failed - This refill cannot be delegated      Passed - Last BP in normal range    BP Readings from Last 1 Encounters:  01/30/20 (!) 116/54          Passed - Valid encounter within last 6 months    Recent Outpatient Visits           5 months ago Essential hypertension   [place], Michelle P, NP   6 months ago Essential hypertension   [place], Michelle P, NP   11 months ago Essential hypertension   [place], [place], NP   1 year ago Impacted cerumen, unspecified laterality   Real, Milford Cage, NP   1 year ago Wound of right lower extremity, initial encounter   [place], [place], NP       Future Appointments             In 2 weeks Oletta Lamas, Milford Cage, NP [facility]              pantoprazole (PROTONIX) 40 MG tablet [Pharmacy Med Name: PANTOPRAZOLE SOD DR 40 MG TAB] 30 tablet 1    Sig: TAKE 1 TABLET BY MOUTH EVERY DAY      Gastroenterology: Proton Pump Inhibitors Passed - 02/17/2020  1:12 PM      Passed - Valid encounter within last 12 months    Recent Outpatient Visits           5 months ago Essential hypertension   [place], Mona, NP   6  months ago Essential hypertension   [place], Michelle P, NP   11 months ago Essential hypertension   [place], Hope, NP   1 year ago Impacted cerumen, unspecified laterality   [place], Milford Cage, NP   1 year ago Wound of right lower extremity, initial encounter   Leonore, Ashton, NP       Future Appointments             In 2 weeks Oletta Lamas Milford Cage, NP [place]

## 2020-02-19 DIAGNOSIS — M1990 Unspecified osteoarthritis, unspecified site: Secondary | ICD-10-CM | POA: Diagnosis not present

## 2020-02-19 DIAGNOSIS — N183 Chronic kidney disease, stage 3 unspecified: Secondary | ICD-10-CM | POA: Diagnosis not present

## 2020-02-19 DIAGNOSIS — M4722 Other spondylosis with radiculopathy, cervical region: Secondary | ICD-10-CM | POA: Diagnosis not present

## 2020-02-19 DIAGNOSIS — I129 Hypertensive chronic kidney disease with stage 1 through stage 4 chronic kidney disease, or unspecified chronic kidney disease: Secondary | ICD-10-CM | POA: Diagnosis not present

## 2020-02-19 DIAGNOSIS — I340 Nonrheumatic mitral (valve) insufficiency: Secondary | ICD-10-CM | POA: Diagnosis not present

## 2020-02-19 DIAGNOSIS — I700 Atherosclerosis of aorta: Secondary | ICD-10-CM | POA: Diagnosis not present

## 2020-02-19 DIAGNOSIS — H4010X0 Unspecified open-angle glaucoma, stage unspecified: Secondary | ICD-10-CM | POA: Diagnosis not present

## 2020-02-19 DIAGNOSIS — I890 Lymphedema, not elsewhere classified: Secondary | ICD-10-CM | POA: Diagnosis not present

## 2020-02-19 DIAGNOSIS — G9341 Metabolic encephalopathy: Secondary | ICD-10-CM | POA: Diagnosis not present

## 2020-02-19 DIAGNOSIS — I739 Peripheral vascular disease, unspecified: Secondary | ICD-10-CM | POA: Diagnosis not present

## 2020-02-19 DIAGNOSIS — Z7982 Long term (current) use of aspirin: Secondary | ICD-10-CM | POA: Diagnosis not present

## 2020-02-24 DIAGNOSIS — Z1211 Encounter for screening for malignant neoplasm of colon: Secondary | ICD-10-CM | POA: Diagnosis not present

## 2020-02-24 DIAGNOSIS — J209 Acute bronchitis, unspecified: Secondary | ICD-10-CM | POA: Diagnosis not present

## 2020-02-24 DIAGNOSIS — Z0001 Encounter for general adult medical examination with abnormal findings: Secondary | ICD-10-CM | POA: Diagnosis not present

## 2020-02-24 DIAGNOSIS — Z23 Encounter for immunization: Secondary | ICD-10-CM | POA: Diagnosis not present

## 2020-02-24 DIAGNOSIS — Z136 Encounter for screening for cardiovascular disorders: Secondary | ICD-10-CM | POA: Diagnosis not present

## 2020-02-25 ENCOUNTER — Other Ambulatory Visit: Payer: Self-pay | Admitting: Internal Medicine

## 2020-02-25 DIAGNOSIS — Z13820 Encounter for screening for osteoporosis: Secondary | ICD-10-CM

## 2020-03-08 ENCOUNTER — Ambulatory Visit (INDEPENDENT_AMBULATORY_CARE_PROVIDER_SITE_OTHER): Payer: Medicare Other | Admitting: Primary Care

## 2020-03-10 ENCOUNTER — Other Ambulatory Visit: Payer: Self-pay | Admitting: Internal Medicine

## 2020-03-10 DIAGNOSIS — Z1231 Encounter for screening mammogram for malignant neoplasm of breast: Secondary | ICD-10-CM

## 2020-03-16 ENCOUNTER — Ambulatory Visit: Payer: Medicare Other | Admitting: Neurology

## 2020-03-19 DIAGNOSIS — I890 Lymphedema, not elsewhere classified: Secondary | ICD-10-CM | POA: Diagnosis not present

## 2020-03-19 DIAGNOSIS — H66009 Acute suppurative otitis media without spontaneous rupture of ear drum, unspecified ear: Secondary | ICD-10-CM | POA: Diagnosis not present

## 2020-03-19 DIAGNOSIS — R0981 Nasal congestion: Secondary | ICD-10-CM | POA: Diagnosis not present

## 2020-03-19 DIAGNOSIS — H6502 Acute serous otitis media, left ear: Secondary | ICD-10-CM | POA: Diagnosis not present

## 2020-04-01 ENCOUNTER — Ambulatory Visit (INDEPENDENT_AMBULATORY_CARE_PROVIDER_SITE_OTHER): Payer: Medicare Other | Admitting: Neurology

## 2020-04-01 ENCOUNTER — Encounter: Payer: Self-pay | Admitting: Neurology

## 2020-04-01 ENCOUNTER — Other Ambulatory Visit: Payer: Self-pay

## 2020-04-01 VITALS — BP 117/58 | HR 71 | Ht <= 58 in | Wt 150.5 lb

## 2020-04-01 DIAGNOSIS — R410 Disorientation, unspecified: Secondary | ICD-10-CM | POA: Diagnosis not present

## 2020-04-01 DIAGNOSIS — G9340 Encephalopathy, unspecified: Secondary | ICD-10-CM | POA: Diagnosis not present

## 2020-04-01 DIAGNOSIS — R9089 Other abnormal findings on diagnostic imaging of central nervous system: Secondary | ICD-10-CM

## 2020-04-01 MED ORDER — LAMOTRIGINE 25 MG PO TABS
ORAL_TABLET | ORAL | 0 refills | Status: DC
Start: 2020-04-01 — End: 2020-05-10

## 2020-04-01 MED ORDER — LAMOTRIGINE 100 MG PO TABS: 100.0000 mg | ORAL_TABLET | Freq: Two times a day (BID) | ORAL | 11 refills | Status: DC

## 2020-04-01 NOTE — Progress Notes (Signed)
PATIENT: [name] DOB: [birth year]  Chief Complaint  Patient presents with  . Altered Mental Status    She is here with her daughter, [name]. She continues to have altered mental status/hallucinations. Recent MMSE 27/30 x 5 animals. She was seen in the ED on 01/28/20. She has completed additional testing since last here. She back home now and her daughter has moved in with her.     HISTORICAL  [name] is a 86 year old female, seen in request by her primary care physician Dr. Maia Petties, [name] accompanied daughter [name] for evaluation of acute mental status change, initial evaluation was on Dec 04, 2019,  I have reviewed and summarized the referring note from the referring physician.  She had a history of left blindness due to glaucoma, cataract, failed left cornea transplant, being independently at her house, able to take care of her daily house chorces, did have baseline mild gait abnormality, relies on her cane, mild lower extremity edema, also has obesity.  Daughter [name] reported, she was able to carry on a normal conversation with her on November 17, 2019, next day, April 20, she was found by her granddaughter confused, sitting in the chair with pants to her ankle, could not get up, generalized weakness, she is able to repeat  "I am [name]", initially refused to go to the emergency room,    Later was brought to emergency room by family, UA was unremarkable, CT angiogram of chest, showed no evidence of PE, EKG showed sinus tachycardia, laboratory evaluation showed mild elevated BNP of 296, D-dimer of 2.05,  MRI of the brain without contrast November 18, 2019, T2/flair hyperdensity within the right occipital, anterior right temporal white matter, with evidence of vasogenic edema, numerous chronic microhemorrhage predominantly peripheral distribution most consistent with cerebral amyloid angiopathy. There was no contrast enhancement on repeat testing April 21.  CT  angiogram of the chest: Cardiomegaly, pulmonary arterial hypertension, no evidence of PE Doppler study of bilateral lower extremity show no evidence of DVT  Fluoroscopy guided lumbar puncture November 21, 2019, CSF RBC 49, WBC 0, protein 80, glucose 79, IgG was elevated 8.9, Albumin mildly decreased 3.0, IgG index mildly elevated 0.8,  Laboratory evaluations in May 2021, potassium 5.4, creatinine 1.14, GFR of 50, CBC showed hemoglobin of 9.6, elevated RDW of 15.6, normal ferritin, folic acid, F64 3329, J1O 5.8, CPK 690, normal TSH, ammonia was mildly elevated 50, with normal lactic acid level, troponin was elevated 56,  She is now at rehabilitation since hospital admission, daughter reported 75% improvement, today's Mini-Mental Status Examination 27 out of 30, missed 3 out of 3 recalls, is able to ambulate with walker after receiving physical therapy, there was no seizure-like spells, no fever,  UPDATE Sept 2 2021: She was readmitted to hospital again on June 24, again June 30 through January 30, 2020, presenting with acute mental status change, hallucinations, repeat MRI of the brain on January 21, 2020, multiple areas of white matter edema with progression in the right temporal lobe, right high frontal, left anterior occipital lobe, there was associated microhemorrhage within this lesions, still favored a diagnosis of cerebral amyloid with inflammation, there was also interval development of mild subarachnoid hemorrhage on the right as well as right temporal subdural hemorrhage  Chest x-ray showed cardiomegaly without edema or infiltration, EEG on January 22, 2020 showed intermittent slow, triphasic waves, mild diffuse encephalopathy,  Echocardiogram in July 2021, ejection fraction 55 to 60%  Laboratory evaluation in 2021: LDL  61, mild anemia hemoglobin of 11.1, elevated RDW 18.3, BMP showed marked decreased GFR of 59, mild elevated high-sensitivity troponin 38, BNP of 375, UDS was negative  She was seen by  neuro hospitalist, was treated with IV Solu-Medrol for 3 days (June 23-25), followed by p.o. prednisone 40 mg daily, stopped prednisone abruptly around February 22, 2020, after use up her 60 tablet supply, there was no significant change stopping prednisone abruptly,    Daughter reported she is almost back to her baseline, ambulate with walker, able to dress, feed, bathing, using bathroom alone  Patient is very involved in today's discussion, cooperative on history taking, examination, and medication discussion., We personally reviewed repeat MRI of the brain on January 21, 2020, in comparison with previous MRI on November 22, 2019, there was multifocal areas of white matter edema progression in the right temporal lobe, right high frontal, left anterior occipital lobe, right posterior parietal lobe lesion appears similar in size, there was associated microhemorrhage with this lesions, there was no contrast-enhancement, still favor the diagnosis of cerebral amyloid with inflammation, interval development of mild subarachnoid hemorrhage on the right as well as right temporal subdural hemorrhage  REVIEW OF SYSTEMS: Full 14 system review of systems performed and notable only for as above All other review of systems were negative.  ALLERGIES: Allergies  Allergen Reactions  . Penicillins Hives    Has patient had a PCN reaction causing immediate rash, facial/tongue/throat swelling, SOB or lightheadedness with hypotension: No Has patient had a PCN reaction causing severe rash involving mucus membranes or skin necrosis: No Has patient had a PCN reaction that required hospitalization No Has patient had a PCN reaction occurring within the last 10 years: No If all of the above answers are "NO", then may proceed with Cephalosporin use.  . Tetanus Toxoid Hives    HOME MEDICATIONS: Current Outpatient Medications  Medication Sig Dispense Refill  . acetaminophen (TYLENOL) 500 MG tablet Take 1 tablet (500 mg total) by  mouth at bedtime. 30 tablet 0  . carboxymethylcellulose (ARTIFICIAL TEARS) 1 % ophthalmic solution Place 1 drop into both eyes 3 (three) times daily as needed (for lubercating eyes). 15 mL 0  . CVS VITAMIN C 500 MG tablet Take 1 tablet (500 mg total) by mouth daily. 30 tablet 0  . docusate sodium (COLACE) 50 MG capsule Take 1-2 capsules (50-100 mg total) by mouth daily as needed for mild constipation.    . Ensure Max Protein (ENSURE MAX PROTEIN) LIQD Take 330 mLs (11 oz total) by mouth daily.    . furosemide (LASIX) 20 MG tablet Take 1 tablet (20 mg total) by mouth every Monday, Wednesday, and Friday. (Patient taking differently: Take 20 mg by mouth daily. ) 12 tablet 0  . gabapentin (NEURONTIN) 100 MG capsule Take 1-2 capsules (100-200 mg total) by mouth See admin instructions. Take 100mg  in AM and 200mg  at bedtime    . Garlic 9381 MG CAPS Take 2 capsules (2,000 mg total) by mouth daily. Take 2 capsules to = 2000 mg 60 capsule 0  . iron polysaccharides (NIFEREX) 150 MG capsule Take 150 mg by mouth daily.    Marland Kitchen losartan (COZAAR) 50 MG tablet Take 50 mg by mouth daily.    . NON FORMULARY Diet Order: Regular NAS, Heart Healthy Diet, thin liquids.    . predniSONE (DELTASONE) 20 MG tablet Take 2 tablets (40 mg total) by mouth daily with breakfast. 60 tablet 0  . spironolactone (ALDACTONE) 25 MG tablet Take 25 mg  by mouth daily.    Marland Kitchen tetrahydrozoline 0.05 % ophthalmic solution Place 1 drop into both eyes daily as needed (eye irritation). 15 mL 0  . trolamine salicylate (ASPERCREME) 10 % cream Apply 1 application topically as needed for muscle pain.    . vitamin B-12 (CYANOCOBALAMIN) 1000 MCG tablet Take 1 tablet (1,000 mcg total) by mouth daily. 30 tablet 0  . amiodarone (PACERONE) 200 MG tablet Take 1 tablet (200 mg total) by mouth daily. 30 tablet 0  . pantoprazole (PROTONIX) 40 MG tablet Take 1 tablet (40 mg total) by mouth daily. 30 tablet 1   No current facility-administered medications for this  visit.    PAST MEDICAL HISTORY: Past Medical History:  Diagnosis Date  . CAP (community acquired pneumonia) 09/21/2015  . Chronic kidney disease, stage III (moderate)   . Encounter for long-term (current) use of other medications   . Glaucoma (increased eye pressure)   . Hard of hearing   . Head pain   . Hypertension   . Impaired glucose tolerance test   . Insomnia, unspecified   . Memory loss   . Morbid obesity ([place])   . Neck pain   . Osteoarthrosis, unspecified whether generalized or localized, other specified sites   . Other lymphedema   . Pain in joint, multiple sites   . Primary open-angle glaucoma(365.11)     PAST SURGICAL HISTORY: Past Surgical History:  Procedure Laterality Date  . ABDOMINAL HYSTERECTOMY    . EYE SURGERY      FAMILY HISTORY: Family History  Problem Relation Age of Onset  . Other Mother        "old age"  . Stomach cancer Father   . Stomach cancer Sister   . Aneurysm Brother     SOCIAL HISTORY: Social History   Socioeconomic History  . Marital status: Widowed    Spouse name: Not on file  . Number of children: 1  . Years of education: nursing school  . Highest education level: Not on file  Occupational History  . Occupation: retired Corporate treasurer  Tobacco Use  . Smoking status: Never Smoker  . Smokeless tobacco: Never Used  Vaping Use  . Vaping Use: Never used  Substance and Sexual Activity  . Alcohol use: No    Alcohol/week: 0.0 standard drinks  . Drug use: No  . Sexual activity: Not on file  Other Topics Concern  . Not on file  Social History Narrative   Patient lives at home alone , widow. Patient is retired and one child. 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   Right-handed.   No daily use of caffeine.      12/04/19 - She normally lives alone but she is currently in rehab at [facility] facility.      Social Determinants of Health   Financial Resource Strain:   . Difficulty of Paying Living Expenses:  Not on file  Food Insecurity:   . Worried About Charity fundraiser in the Last Year: Not on file  . Ran Out of Food in the Last Year: Not on file  Transportation Needs:   . Lack of Transportation (Medical): Not on file  . Lack of Transportation (Non-Medical): Not on file  Physical Activity:   . Days of Exercise per Week: Not on file  . Minutes of Exercise per Session: Not on file  Stress:   . Feeling of Stress : Not on file  Social Connections:   .  Frequency of Communication with Friends and Family: Not on file  . Frequency of Social Gatherings with Friends and Family: Not on file  . Attends Religious Services: Not on file  . Active Member of Clubs or Organizations: Not on file  . Attends Archivist Meetings: Not on file  . Marital Status: Not on file  Intimate Partner Violence:   . Fear of Current or Ex-Partner: Not on file  . Emotionally Abused: Not on file  . Physically Abused: Not on file  . Sexually Abused: Not on file     PHYSICAL EXAM   Vitals:   04/01/20 1032  BP: (!) 117/58  Pulse: 71  Weight: 150 lb 8 oz (68.3 kg)  Height: 4\' 9"  (1.448 m)   Not recorded     Body mass index is 32.57 kg/m.  PHYSICAL EXAMNIATION:  Gen: NAD, conversant, well nourised, well groomed                     Cardiovascular: Regular rate rhythm, no peripheral edema, warm, nontender. Eyes: Conjunctivae clear without exudates or hemorrhage Neck: Supple, no carotid bruits. Pulmonary: Clear to auscultation bilaterally   NEUROLOGICAL EXAM:  MENTAL STATUS: MMSE - Mini Mental State Exam 12/04/2019  Orientation to time 5  Orientation to Place 5  Registration 3  Attention/ Calculation 5  Recall 0  Language- name 2 objects 2  Language- repeat 1  Language- follow 3 step command 3  Language- read & follow direction 1  Write a sentence 1  Copy design 1  Total score 27   Awake, alert cooperative on exam, history taking, and medication discussion, no dysarthria,  CRANIAL  NERVES: CN II: right visual field was intact, Left pupil opaque, CN III, IV, VI: extraocular movement are normal. No ptosis. CN V: Facial sensation is intact to light touch CN VII: Face is symmetric with normal eye closure  CN VIII: Decreased hearing during causal conversation. CN IX, X: Phonation is normal. CN XI: Head turning and shoulder shrug are intact  MOTOR: No significant upper or lower extremity muscle weakness  REFLEXES: Reflexes are hypoactive and symmetric, plantar responses are flexor.  SENSORY: Limited due to lower extremity pitting edema  COORDINATION: There is no trunk or limb dysmetria noted.  GAIT/STANCE: She push on chair arm to getup, mildly unsteady, atalgic.  DIAGNOSTIC DATA (LABS, IMAGING, TESTING) - I reviewed patient records, labs, notes, testing and imaging myself where available.   ASSESSMENT AND PLAN  [name] is a 87 y.o. female   Sudden onset confusion, with abnormal MRI of brain, vasogenic edema involving right parietal, temporal lobe, no contrast-enhancement, most compatible with amyloid related inflammation such as amyloid beta related angiitis, that was stable on repeat MRI of the brain on April 20, and 24th, there was no contrast-enhancement Recurrent episode of acute confusion, visual hallucination in June 2021  She has significant improvement after steroid treatment, iv solumedrol 1g (June 23-25, 2021) was followed by prednisone 40mg  daily, stopped abruptly on February 22 2020,   There was no recurrent symptoms after abrupt stopping prednisone 40mg  daily.  DDx also include partial seizure, will try titrating dose of lamtorigine to 100mg  bid  Call clinic for worsening/recurrent symptoms.  Return to clinic in 6 weeks  [name], M.D. Ph.D.  [facility] [address] [place], [place] [address] Ph: [phone] Fax: [phone]  CC: Catherine Hose, MD

## 2020-04-21 ENCOUNTER — Ambulatory Visit: Payer: Medicare Other | Admitting: Podiatry

## 2020-04-21 ENCOUNTER — Other Ambulatory Visit: Payer: Self-pay

## 2020-04-21 ENCOUNTER — Encounter (HOSPITAL_COMMUNITY): Payer: Self-pay

## 2020-04-21 ENCOUNTER — Ambulatory Visit (INDEPENDENT_AMBULATORY_CARE_PROVIDER_SITE_OTHER): Payer: Medicare Other

## 2020-04-21 ENCOUNTER — Ambulatory Visit (HOSPITAL_COMMUNITY)
Admission: EM | Admit: 2020-04-21 | Discharge: 2020-04-21 | Disposition: A | Discharge status: Home / Self Care | Payer: Medicare Other | Attending: Family Medicine | Admitting: Family Medicine

## 2020-04-21 DIAGNOSIS — W19XXXA Unspecified fall, initial encounter: Secondary | ICD-10-CM

## 2020-04-21 DIAGNOSIS — M25561 Pain in right knee: Secondary | ICD-10-CM

## 2020-04-21 DIAGNOSIS — T148XXA Other injury of unspecified body region, initial encounter: Secondary | ICD-10-CM

## 2020-04-21 DIAGNOSIS — M25562 Pain in left knee: Secondary | ICD-10-CM

## 2020-04-21 DIAGNOSIS — M1712 Unilateral primary osteoarthritis, left knee: Secondary | ICD-10-CM | POA: Diagnosis not present

## 2020-04-21 DIAGNOSIS — S8992XA Unspecified injury of left lower leg, initial encounter: Secondary | ICD-10-CM | POA: Diagnosis not present

## 2020-04-21 DIAGNOSIS — M1711 Unilateral primary osteoarthritis, right knee: Secondary | ICD-10-CM | POA: Diagnosis not present

## 2020-04-21 NOTE — ED Triage Notes (Signed)
Pt presents with right elbow pain and right knee pain after a fall in her driveway today while trying to get transportation to an appointment.

## 2020-04-21 NOTE — ED Provider Notes (Signed)
[place]    CSN: [id number] Arrival date & time: 04/21/20  1519      History   Chief Complaint Chief Complaint  Patient presents with  . Fall    HPI [name] is a 86 y.o. female.   Here today for evaluation of multiple areas of pain following a mechanical fall on her driveway this afternoon while ambulating with her walker. She states the wheels turned and she fell over onto her right side, catching herself in part with her right elbow. Her knees are the most painful but also having some mild pain in right wrist, elbow and shoulder. Small abrasion to right elbow but otherwise no broken skin. Denies any impact to head, dizziness, syncope, decreased ROM to any of her joints beyond her baseline arthritis and poor ambulation. Has not taken anything OTC for any of this.      Past Medical History:  Diagnosis Date  . CAP (community acquired pneumonia) 09/21/2015  . Chronic kidney disease, stage III (moderate)   . Encounter for long-term (current) use of other medications   . Glaucoma (increased eye pressure)   . Hard of hearing   . Head pain   . Hypertension   . Impaired glucose tolerance test   . Insomnia, unspecified   . Memory loss   . Morbid obesity ([place])   . Neck pain   . Osteoarthrosis, unspecified whether generalized or localized, other specified sites   . Other lymphedema   . Pain in joint, multiple sites   . Primary open-angle glaucoma(365.11)     Patient Active Problem List   Diagnosis Date Noted  . Atrial fibrillation with RVR ([place]) 01/28/2020  . New onset atrial fibrillation ([place]) 01/28/2020  . History of subdural hematoma, 01/21/20 01/28/2020  . History of subarachnoid hemorrhage, 01/21/20 01/28/2020  . Atrial fibrillation with rapid ventricular response ([place]) 01/28/2020  . Epigastric pain 01/28/2020  . Cellulitis of right leg 01/21/2020  . Dementia without behavioral disturbance ([place]) 01/21/2020  . AMS (altered mental status) 01/20/2020    . Abnormal finding on MRI of brain 12/04/2019  . Confusion 12/04/2019  . Cerebral amyloid angiopathy ([place]) 12/03/2019  . Hypoalbuminemia 11/29/2019  . Anasarca 11/29/2019  . PVD (peripheral vascular disease) ([place]) 12/19/2018  . Mitral valve insufficiency 03/19/2017  . Other spondylosis with radiculopathy, cervical region 03/14/2017  . Cerumen impaction 12/04/2016  . Generalized weakness 09/22/2015  . Acidosis 09/22/2015  . Acute encephalopathy 09/22/2015  . Hyperglycemia 09/22/2015  . Leukopenia 09/22/2015  . CAP (community acquired pneumonia) 09/22/2015  . Bilateral leg edema 09/22/2015  . Constipation 09/22/2015  . Snoring 09/13/2015  . Glaucoma (increased eye pressure)   . Hypertension   . Neck pain   . Head pain   . Pain in joint, multiple sites   . Impaired glucose tolerance test   . Osteoarthrosis, unspecified whether generalized or localized, other specified sites   . Morbid obesity ([place])   . Medication management   . Other lymphedema   . Insomnia, unspecified   . Chronic kidney disease, stage III (moderate)   . Primary open-angle glaucoma(365.11)     Past Surgical History:  Procedure Laterality Date  . ABDOMINAL HYSTERECTOMY    . EYE SURGERY      OB History   No obstetric history on file.      Home Medications    Prior to Admission medications   Medication Sig Start Date End Date Taking? Authorizing Provider  acetaminophen (TYLENOL) 500 MG tablet  Take 1 tablet (500 mg total) by mouth at bedtime. 12/16/19   Wille Celeste, PA-C  amiodarone (PACERONE) 200 MG tablet Take 1 tablet (200 mg total) by mouth daily. 01/31/20 03/01/20  Donne Hazel, MD  carboxymethylcellulose (ARTIFICIAL TEARS) 1 % ophthalmic solution Place 1 drop into both eyes 3 (three) times daily as needed (for lubercating eyes). 12/16/19   Granville Lewis C, PA-C  [facility] VITAMIN C 500 MG tablet Take 1 tablet (500 mg total) by mouth daily. 12/16/19   Granville Lewis C, PA-C  docusate sodium (COLACE) 50 MG  capsule Take 1-2 capsules (50-100 mg total) by mouth daily as needed for mild constipation. 01/23/20   Samuella Cota, MD  Ensure Max Protein (ENSURE MAX PROTEIN) LIQD Take 330 mLs (11 oz total) by mouth daily. 01/24/20   Samuella Cota, MD  furosemide (LASIX) 20 MG tablet Take 1 tablet (20 mg total) by mouth every Monday, Wednesday, and Friday. Patient taking differently: Take 20 mg by mouth daily.  12/17/19   Granville Lewis C, PA-C  gabapentin (NEURONTIN) 100 MG capsule Take 1-2 capsules (100-200 mg total) by mouth See admin instructions. Take 100mg  in AM and 200mg  at bedtime 01/23/20   Samuella Cota, MD  Garlic 0973 MG CAPS Take 2 capsules (2,000 mg total) by mouth daily. Take 2 capsules to = 2000 mg 12/16/19   Granville Lewis C, PA-C  iron polysaccharides (NIFEREX) 150 MG capsule Take 150 mg by mouth daily.    [provider]  lamoTRIgine (LAMICTAL) 100 MG tablet Take 1 tablet (100 mg total) by mouth 2 (two) times daily. 04/01/20   Marcial Pacas, MD  lamoTRIgine (LAMICTAL) 25 MG tablet 1 tab bid x one week 2 tab bid x 2nd week 3 tab bid x 3rd week 04/01/20   Marcial Pacas, MD  losartan (COZAAR) 50 MG tablet Take 50 mg by mouth daily.    [provider]  NON FORMULARY Diet Order: Regular NAS, Heart Healthy Diet, thin liquids.    [provider]  pantoprazole (PROTONIX) 40 MG tablet Take 1 tablet (40 mg total) by mouth daily. 01/23/20 03/23/20  Samuella Cota, MD  predniSONE (DELTASONE) 20 MG tablet Take 2 tablets (40 mg total) by mouth daily with breakfast. 01/24/20   Samuella Cota, MD  spironolactone (ALDACTONE) 25 MG tablet Take 25 mg by mouth daily.    [provider]  tetrahydrozoline 0.05 % ophthalmic solution Place 1 drop into both eyes daily as needed (eye irritation). 12/16/19   Granville Lewis C, PA-C  trolamine salicylate (ASPERCREME) 10 % cream Apply 1 application topically as needed for muscle pain.    [provider]  vitamin B-12  (CYANOCOBALAMIN) 1000 MCG tablet Take 1 tablet (1,000 mcg total) by mouth daily. 12/16/19   Wille Celeste, PA-C    Family History Family History  Problem Relation Age of Onset  . Other Mother        "old age"  . Stomach cancer Father   . Stomach cancer Sister   . Aneurysm Brother     Social History Social History   Tobacco Use  . Smoking status: Never Smoker  . Smokeless tobacco: Never Used  Vaping Use  . Vaping Use: Never used  Substance Use Topics  . Alcohol use: No    Alcohol/week: 0.0 standard drinks  . Drug use: No     Allergies   Penicillins and Tetanus toxoid   Review of Systems Review of Systems PER  HPI    Physical Exam Triage Vital Signs ED Triage Vitals [04/21/20 1719]  Enc Vitals Group     BP 138/68     Pulse Rate 62     Resp 16     Temp 98.1 F (36.7 C)     Temp Source Oral     SpO2 100 %     Weight      Height      Head Circumference      Peak Flow      Pain Score 6     Pain Loc      Pain Edu?      Excl. in [place]?    No data found.  Updated Vital Signs BP 138/68 (BP Location: Right Arm)   Pulse 62   Temp 98.1 F (36.7 C) (Oral)   Resp 16   SpO2 100%   Visual Acuity Right Eye Distance:   Left Eye Distance:   Bilateral Distance:    Right Eye Near:   Left Eye Near:    Bilateral Near:     Physical Exam Vitals and nursing note reviewed.  Constitutional:      Appearance: Normal appearance. She is obese.  HENT:     Head: Atraumatic.     Mouth/Throat:     Mouth: Mucous membranes are moist.  Eyes:     Extraocular Movements: Extraocular movements intact.     Conjunctiva/sclera: Conjunctivae normal.  Cardiovascular:     Rate and Rhythm: Normal rate.     Pulses: Normal pulses.  Pulmonary:     Effort: Pulmonary effort is normal.     Breath sounds: Normal breath sounds.  Abdominal:     General: Bowel sounds are normal.     Palpations: Abdomen is soft.  Musculoskeletal:        General: Swelling (2+ edema b/l LEs - baseline  per patient) and tenderness (generalized ttp over b/l knees, particularly anteriorly) present.     Comments: Unclear what her baseline strength and mobility is, but b/l knees intact to PROM exercises and no joint laxity apparent on exam  Skin:    General: Skin is warm and dry.  Neurological:     Mental Status: She is alert and oriented to person, place, and time.     Comments: All 4 extremities appear neurovascularly intact  Psychiatric:        Mood and Affect: Mood normal.        Thought Content: Thought content normal.        Judgment: Judgment normal.    UC Treatments / Results  Labs (all labs ordered are listed, but only abnormal results are displayed) Labs Reviewed - No data to display  EKG   Radiology DG Knee AP/LAT W/Sunrise Left  Result Date: 04/21/2020 CLINICAL DATA:  Knee pain following fall yesterday, initial encounter EXAM: LEFT KNEE 3 VIEWS COMPARISON:  03/27/2017 FINDINGS: Tricompartmental degenerative changes are noted. No acute fracture is seen. No joint effusion is noted. IMPRESSION: Tricompartmental degenerative changes are again seen. No acute bony abnormality is noted. Electronically Signed   By: Inez Catalina M.D.   On: 04/21/2020 19:08   DG Knee AP/LAT W/Sunrise Right  Result Date: 04/21/2020 CLINICAL DATA:  Fall 1 day ago with right knee pain, initial encounter EXAM: RIGHT KNEE 3 VIEWS COMPARISON:  None. FINDINGS: Tricompartmental degenerative changes are noted. No acute fracture or dislocation is seen. No joint effusion is noted. Diffuse vascular calcifications are seen. IMPRESSION: Degenerative change without acute abnormality. Electronically  Signed   By: Inez Catalina M.D.   On: 04/21/2020 19:07    Procedures Procedures (including critical care time)  Medications Ordered in UC Medications - No data to display  Initial Impression / Assessment and Plan / UC Course  I have reviewed the triage vital signs and the nursing notes.  Pertinent labs & imaging  results that were available during my care of the patient were reviewed by me and considered in my medical decision making (see chart for details).     Mechanical fall this morning resulting in right sided multi joint pain and small abrasion to right elbow. Elbow bandaged to cover small open area of abrasion. X-rays of both knees performed per patient request though exam appeared reassuring on these, imaging showed stable degenerative changes with no acute abnormality. Use ice, voltaren gel and tylenol prn for pain. Declines x-rays on right wrist, elbow and shoulder and all of these were benign appearing on exam.   Final Clinical Impressions(s) / UC Diagnoses   Final diagnoses:  Acute pain of both knees  Fall, initial encounter  Abrasion     Discharge Instructions     Apply ice off and on for pain and swelling of the knees. Use voltaren gel or aspercream as needed to all areas of pain from fall. Keep abrasion to right elbow clean and watch for signs of infection    ED Prescriptions    None     PDMP not reviewed this encounter.   Volney American, [place] 04/21/20 1936

## 2020-04-21 NOTE — Discharge Instructions (Addendum)
Apply ice off and on for pain and swelling of the knees. Use voltaren gel or aspercream as needed to all areas of pain from fall. Keep abrasion to right elbow clean and watch for signs of infection

## 2020-04-26 ENCOUNTER — Telehealth: Payer: Self-pay | Admitting: Neurology

## 2020-04-26 NOTE — Telephone Encounter (Signed)
I spoke to the patient's daughter who reports significant gait difficulty and dizziness since increasing her Lamictal to 100mg  BID. Per vo by Dr. Krista Blue, she would like for her to continue Lamictal 100mg  but can decrease the dosage to 0.5 tablet BID. The patient's daughter verbalized understanding of this plan. She will call back if symptoms do not improve after the reduction of the medication.

## 2020-04-26 NOTE — Telephone Encounter (Signed)
Pt's daughter, Freddi Che (on [place]) called, lamoTRIgine (LAMICTAL) 100 MG tablet causing weakness in her legs, have to walk with a walker. She's not having seizure and ask that she stop taking lamoTRIgine (LAMICTAL) 100 MG tablet. Would like a call from the nurse.

## 2020-04-27 DIAGNOSIS — W19XXXA Unspecified fall, initial encounter: Secondary | ICD-10-CM | POA: Diagnosis not present

## 2020-04-27 DIAGNOSIS — K921 Melena: Secondary | ICD-10-CM | POA: Diagnosis not present

## 2020-04-27 DIAGNOSIS — N183 Chronic kidney disease, stage 3 unspecified: Secondary | ICD-10-CM | POA: Diagnosis not present

## 2020-04-27 DIAGNOSIS — M1712 Unilateral primary osteoarthritis, left knee: Secondary | ICD-10-CM | POA: Diagnosis not present

## 2020-04-27 DIAGNOSIS — B372 Candidiasis of skin and nail: Secondary | ICD-10-CM | POA: Diagnosis not present

## 2020-04-27 DIAGNOSIS — R251 Tremor, unspecified: Secondary | ICD-10-CM | POA: Diagnosis not present

## 2020-05-07 DIAGNOSIS — I480 Paroxysmal atrial fibrillation: Secondary | ICD-10-CM | POA: Diagnosis not present

## 2020-05-07 DIAGNOSIS — R7303 Prediabetes: Secondary | ICD-10-CM | POA: Diagnosis not present

## 2020-05-07 DIAGNOSIS — I10 Essential (primary) hypertension: Secondary | ICD-10-CM | POA: Diagnosis not present

## 2020-05-07 DIAGNOSIS — E785 Hyperlipidemia, unspecified: Secondary | ICD-10-CM | POA: Diagnosis not present

## 2020-05-07 DIAGNOSIS — M1990 Unspecified osteoarthritis, unspecified site: Secondary | ICD-10-CM | POA: Diagnosis not present

## 2020-05-07 NOTE — Telephone Encounter (Signed)
Pt's daughter has called back to report the medication still has pt a little shakey.  Daughter feels the medication may be affecting pt's kidneys and liver because she is having to have lab work done again due to some malfunctioning.  Please call.

## 2020-05-10 MED ORDER — DIVALPROEX SODIUM 250 MG PO DR TAB: 250.0000 mg | DELAYED_RELEASE_TABLET | Freq: Two times a day (BID) | ORAL | 11 refills | Status: DC

## 2020-05-10 NOTE — Telephone Encounter (Addendum)
I returned the call to the patient's daughter. She has reduced the Lamictal dose to 50mg , one tablet BID. States her mother still does not feel well when taking this medication. She is having tremors and feels jittery. They are requesting a medication change.  Her daughter states the patient is going back to her PCP for repeat labs of her abnormal kidney function. In review of her chart, this has been an issue in the past. I encouraged her to make sure her mother is staying well hydrated with water.

## 2020-05-10 NOTE — Telephone Encounter (Signed)
The other medication options are Depakote DR 250mg  bid,

## 2020-05-10 NOTE — Telephone Encounter (Signed)
Per vo by Dr. Krista Blue,  stop Lamictal and start Depakote. Her daughter verbalized understanding of the plan. She was agreeable to the change in therapy.

## 2020-05-10 NOTE — Addendum Note (Signed)
Addended by: Noberto Retort C on: 05/10/2020 04:00 PM   Modules accepted: Orders

## 2020-05-11 NOTE — Telephone Encounter (Signed)
Pt's daughter, Ree Edman, called, with divalproex (DEPAKOTE) 250 MG DR tablet she was on 100mg , now she is on 500 mg a day. Do she take 500 mg a day? Would like a call from the nurse for clarification.

## 2020-05-11 NOTE — Telephone Encounter (Signed)
Her mother was previously prescribed lamotrigine 100mg  BID. The medication was stopped due to adverse side effects. She is now being prescribed divalproex 250 BID.   I returned the call to her daughter to explain that lamotrigine and divalproex are two different medications and the the mg of the tablets are not equivalent. She appreciated the clarification.

## 2020-05-12 ENCOUNTER — Observation Stay (HOSPITAL_COMMUNITY)
Admission: EM | Admit: 2020-05-12 | Discharge: 2020-05-13 | Disposition: A | Discharge status: Home / Self Care | Payer: Medicare Other | Attending: Internal Medicine | Admitting: Internal Medicine

## 2020-05-12 ENCOUNTER — Encounter (HOSPITAL_COMMUNITY): Payer: Self-pay

## 2020-05-12 ENCOUNTER — Other Ambulatory Visit: Payer: Self-pay

## 2020-05-12 ENCOUNTER — Emergency Department (HOSPITAL_COMMUNITY): Payer: Medicare Other

## 2020-05-12 DIAGNOSIS — R410 Disorientation, unspecified: Secondary | ICD-10-CM

## 2020-05-12 DIAGNOSIS — S06350A Traumatic hemorrhage of left cerebrum without loss of consciousness, initial encounter: Secondary | ICD-10-CM

## 2020-05-12 DIAGNOSIS — M7989 Other specified soft tissue disorders: Secondary | ICD-10-CM | POA: Insufficient documentation

## 2020-05-12 DIAGNOSIS — S06320A Contusion and laceration of left cerebrum without loss of consciousness, initial encounter: Secondary | ICD-10-CM

## 2020-05-12 DIAGNOSIS — G40901 Epilepsy, unspecified, not intractable, with status epilepticus: Secondary | ICD-10-CM

## 2020-05-12 DIAGNOSIS — I129 Hypertensive chronic kidney disease with stage 1 through stage 4 chronic kidney disease, or unspecified chronic kidney disease: Secondary | ICD-10-CM | POA: Insufficient documentation

## 2020-05-12 DIAGNOSIS — R918 Other nonspecific abnormal finding of lung field: Secondary | ICD-10-CM | POA: Diagnosis not present

## 2020-05-12 DIAGNOSIS — R4182 Altered mental status, unspecified: Secondary | ICD-10-CM | POA: Diagnosis present

## 2020-05-12 DIAGNOSIS — Z20822 Contact with and (suspected) exposure to covid-19: Secondary | ICD-10-CM | POA: Diagnosis not present

## 2020-05-12 DIAGNOSIS — R569 Unspecified convulsions: Secondary | ICD-10-CM | POA: Diagnosis not present

## 2020-05-12 DIAGNOSIS — R0902 Hypoxemia: Secondary | ICD-10-CM | POA: Diagnosis not present

## 2020-05-12 DIAGNOSIS — L8990 Pressure ulcer of unspecified site, unspecified stage: Secondary | ICD-10-CM | POA: Diagnosis present

## 2020-05-12 DIAGNOSIS — I10 Essential (primary) hypertension: Secondary | ICD-10-CM | POA: Diagnosis not present

## 2020-05-12 DIAGNOSIS — I619 Nontraumatic intracerebral hemorrhage, unspecified: Secondary | ICD-10-CM | POA: Diagnosis not present

## 2020-05-12 DIAGNOSIS — Z79899 Other long term (current) drug therapy: Secondary | ICD-10-CM | POA: Insufficient documentation

## 2020-05-12 DIAGNOSIS — M6281 Muscle weakness (generalized): Secondary | ICD-10-CM | POA: Diagnosis not present

## 2020-05-12 DIAGNOSIS — I618 Other nontraumatic intracerebral hemorrhage: Secondary | ICD-10-CM | POA: Diagnosis not present

## 2020-05-12 DIAGNOSIS — R531 Weakness: Secondary | ICD-10-CM | POA: Diagnosis not present

## 2020-05-12 DIAGNOSIS — N183 Chronic kidney disease, stage 3 unspecified: Secondary | ICD-10-CM | POA: Insufficient documentation

## 2020-05-12 DIAGNOSIS — R5381 Other malaise: Secondary | ICD-10-CM | POA: Diagnosis not present

## 2020-05-12 LAB — URINALYSIS, ROUTINE W REFLEX MICROSCOPIC
Bilirubin Urine: NEGATIVE
Glucose, UA: NEGATIVE mg/dL
Hgb urine dipstick: NEGATIVE
Ketones, ur: NEGATIVE mg/dL
Leukocytes,Ua: NEGATIVE
Nitrite: NEGATIVE
Protein, ur: NEGATIVE mg/dL
Specific Gravity, Urine: 1.011 (ref 1.005–1.030)
pH: 5 (ref 5.0–8.0)

## 2020-05-12 LAB — CBC WITH DIFFERENTIAL/PLATELET
Abs Immature Granulocytes: 0.01 10*3/uL (ref 0.00–0.07)
Basophils Absolute: 0 10*3/uL (ref 0.0–0.1)
Basophils Relative: 1 %
Eosinophils Absolute: 0.1 10*3/uL (ref 0.0–0.5)
Eosinophils Relative: 4 %
HCT: 42.3 % (ref 36.0–46.0)
Hemoglobin: 12.8 g/dL (ref 12.0–15.0)
Immature Granulocytes: 0 %
Lymphocytes Relative: 21 %
Lymphs Abs: 0.9 10*3/uL (ref 0.7–4.0)
MCH: 26.5 pg (ref 26.0–34.0)
MCHC: 30.3 g/dL (ref 30.0–36.0)
MCV: 87.6 fL (ref 80.0–100.0)
Monocytes Absolute: 0.2 10*3/uL (ref 0.1–1.0)
Monocytes Relative: 6 %
Neutro Abs: 2.7 10*3/uL (ref 1.7–7.7)
Neutrophils Relative %: 68 %
Platelets: 212 10*3/uL (ref 150–400)
RBC: 4.83 MIL/uL (ref 3.87–5.11)
RDW: 18.6 % — ABNORMAL HIGH (ref 11.5–15.5)
WBC: 4 10*3/uL (ref 4.0–10.5)
nRBC: 0 % (ref 0.0–0.2)

## 2020-05-12 LAB — RESPIRATORY PANEL BY RT PCR (FLU A&B, COVID)
Influenza A by PCR: NEGATIVE
Influenza B by PCR: NEGATIVE
SARS Coronavirus 2 by RT PCR: NEGATIVE

## 2020-05-12 LAB — COMPREHENSIVE METABOLIC PANEL
ALT: 14 U/L (ref 0–44)
AST: 23 U/L (ref 15–41)
Albumin: 4.3 g/dL (ref 3.5–5.0)
Alkaline Phosphatase: 106 U/L (ref 38–126)
Anion gap: 11 (ref 5–15)
BUN: 25 mg/dL — ABNORMAL HIGH (ref 8–23)
CO2: 26 mmol/L (ref 22–32)
Calcium: 10.1 mg/dL (ref 8.9–10.3)
Chloride: 104 mmol/L (ref 98–111)
Creatinine, Ser: 1.38 mg/dL — ABNORMAL HIGH (ref 0.44–1.00)
GFR, Estimated: 34 mL/min — ABNORMAL LOW (ref >60)
Glucose, Bld: 112 mg/dL — ABNORMAL HIGH (ref 70–99)
Potassium: 4.8 mmol/L (ref 3.5–5.1)
Sodium: 141 mmol/L (ref 135–145)
Total Bilirubin: 0.8 mg/dL (ref 0.3–1.2)
Total Protein: 8.4 g/dL — ABNORMAL HIGH (ref 6.5–8.1)

## 2020-05-12 LAB — TROPONIN I (HIGH SENSITIVITY)
Troponin I (High Sensitivity): 13 ng/L (ref <18)
Troponin I (High Sensitivity): 10 ng/L (ref <18)

## 2020-05-12 LAB — PROTIME-INR
INR: 1.1 (ref 0.8–1.2)
Prothrombin Time: 13.5 seconds (ref 11.4–15.2)

## 2020-05-12 LAB — AMMONIA: Ammonia: 13 umol/L (ref 9–35)

## 2020-05-12 LAB — MAGNESIUM: Magnesium: 2.1 mg/dL (ref 1.7–2.4)

## 2020-05-12 LAB — BRAIN NATRIURETIC PEPTIDE: B Natriuretic Peptide: 176.4 pg/mL — ABNORMAL HIGH (ref 0.0–100.0)

## 2020-05-12 MED ORDER — ACETAMINOPHEN 325 MG PO TABS: 650.0000 mg | ORAL_TABLET | Freq: Four times a day (QID) | ORAL | Status: DC | PRN

## 2020-05-12 MED ORDER — LEVETIRACETAM IN NACL 500 MG/100ML IV SOLN: 500.0000 mg | Freq: Once | INTRAVENOUS | Status: DC

## 2020-05-12 MED ORDER — SCOPOLAMINE 1 MG/3DAYS TD PT72
1.0000 | MEDICATED_PATCH | TRANSDERMAL | Status: DC
  Administered 2020-05-12: 1.5 mg via TRANSDERMAL
  Filled 2020-05-12: qty 1

## 2020-05-12 MED ORDER — CLEVIDIPINE BUTYRATE 0.5 MG/ML IV EMUL
0.0000 mg/h | INTRAVENOUS | Status: DC
  Administered 2020-05-12: 1 mg/h via INTRAVENOUS
  Filled 2020-05-12 (×2): qty 50

## 2020-05-12 MED ORDER — ACETAMINOPHEN 650 MG RE SUPP: 650.0000 mg | Freq: Four times a day (QID) | RECTAL | Status: DC | PRN

## 2020-05-12 MED ORDER — ONDANSETRON HCL 4 MG/2ML IJ SOLN: 4.0000 mg | Freq: Four times a day (QID) | INTRAMUSCULAR | Status: DC | PRN

## 2020-05-12 MED ORDER — ONDANSETRON HCL 4 MG PO TABS: 4.0000 mg | ORAL_TABLET | Freq: Four times a day (QID) | ORAL | Status: DC | PRN

## 2020-05-12 MED ORDER — LORAZEPAM 2 MG/ML IJ SOLN
1.0000 mg | INTRAMUSCULAR | Status: DC | PRN
  Administered 2020-05-12: 1 mg via INTRAVENOUS
  Filled 2020-05-12: qty 1

## 2020-05-12 MED ORDER — FENTANYL CITRATE (PF) 100 MCG/2ML IJ SOLN
12.5000 ug | INTRAMUSCULAR | Status: DC | PRN
  Administered 2020-05-13 (×2): 12.5 ug via INTRAVENOUS
  Filled 2020-05-12 (×2): qty 2

## 2020-05-12 MED ORDER — LORAZEPAM 2 MG/ML PO CONC
1.0000 mg | ORAL | Status: DC | PRN
  Filled 2020-05-12: qty 0.5

## 2020-05-12 MED ORDER — LORAZEPAM 2 MG/ML IJ SOLN
INTRAMUSCULAR | Status: AC
  Filled 2020-05-12: qty 1

## 2020-05-12 MED ORDER — LORAZEPAM 2 MG/ML IJ SOLN
1.0000 mg | Freq: Once | INTRAMUSCULAR | Status: AC
  Administered 2020-05-12: 1 mg via INTRAVENOUS

## 2020-05-12 MED ORDER — HYDROMORPHONE HCL 1 MG/ML PO LIQD
1.0000 mg | ORAL | Status: DC | PRN
  Administered 2020-05-12: 1 mg via ORAL
  Filled 2020-05-12: qty 1

## 2020-05-12 MED ORDER — LEVETIRACETAM IN NACL 1000 MG/100ML IV SOLN
1000.0000 mg | Freq: Once | INTRAVENOUS | Status: AC
  Administered 2020-05-12: 1000 mg via INTRAVENOUS
  Filled 2020-05-12: qty 100

## 2020-05-12 MED ORDER — LORAZEPAM 2 MG/ML IJ SOLN
1.0000 mg | Freq: Once | INTRAMUSCULAR | Status: AC
  Administered 2020-05-12: 1 mg via INTRAMUSCULAR

## 2020-05-12 MED ORDER — MORPHINE 100MG IN NS 100ML (1MG/ML) PREMIX INFUSION: 0.5000 mg/h | INTRAVENOUS | Status: DC

## 2020-05-12 NOTE — ED Provider Notes (Addendum)
[facility] DEPT Provider Note   CSN: [id number] Arrival date & time: 05/12/20  1239     History Chief Complaint  Patient presents with  . Weakness  . Altered Mental Status    DIANE HANEL is a 86 y.o. female.  Presented to ER with concern for multiple symptoms.  Daughter reports that couple weeks ago her primary doctor was concerned about her kidney function and stopped her Lasix, however ever since they have noticed significant swelling in her lower legs.  Also have noted increased generalized weakness and fatigue.  She has had increased difficulty with her ADLs.  Daughter also reports frequent falls, last fall was Monday.  Never more than ground-level fall, no known head trauma.  Reports that went to urgent care and was told everything was fine.  Patient has been walking with walker.  Patient denies any acute pain or complaints at time of interview.  HPI     Past Medical History:  Diagnosis Date  . CAP (community acquired pneumonia) 09/21/2015  . Chronic kidney disease, stage III (moderate) (HCC)   . Encounter for long-term (current) use of other medications   . Glaucoma (increased eye pressure)   . Hard of hearing   . Head pain   . Hypertension   . Impaired glucose tolerance test   . Insomnia, unspecified   . Memory loss   . Morbid obesity ([place])   . Neck pain   . Osteoarthrosis, unspecified whether generalized or localized, other specified sites   . Other lymphedema   . Pain in joint, multiple sites   . Primary open-angle glaucoma(365.11)     Patient Active Problem List   Diagnosis Date Noted  . Atrial fibrillation with RVR ([place]) 01/28/2020  . New onset atrial fibrillation ([place]) 01/28/2020  . History of subdural hematoma, 01/21/20 01/28/2020  . History of subarachnoid hemorrhage, 01/21/20 01/28/2020  . Atrial fibrillation with rapid ventricular response ([place]) 01/28/2020  . Epigastric pain 01/28/2020  . Cellulitis of right leg  01/21/2020  . Dementia without behavioral disturbance ([place]) 01/21/2020  . AMS (altered mental status) 01/20/2020  . Abnormal finding on MRI of brain 12/04/2019  . Confusion 12/04/2019  . Cerebral amyloid angiopathy ([place]) 12/03/2019  . Hypoalbuminemia 11/29/2019  . Anasarca 11/29/2019  . PVD (peripheral vascular disease) ([place]) 12/19/2018  . Mitral valve insufficiency 03/19/2017  . Other spondylosis with radiculopathy, cervical region 03/14/2017  . Cerumen impaction 12/04/2016  . Generalized weakness 09/22/2015  . Acidosis 09/22/2015  . Acute encephalopathy 09/22/2015  . Hyperglycemia 09/22/2015  . Leukopenia 09/22/2015  . CAP (community acquired pneumonia) 09/22/2015  . Bilateral leg edema 09/22/2015  . Constipation 09/22/2015  . Snoring 09/13/2015  . Glaucoma (increased eye pressure)   . Hypertension   . Neck pain   . Head pain   . Pain in joint, multiple sites   . Impaired glucose tolerance test   . Osteoarthrosis, unspecified whether generalized or localized, other specified sites   . Morbid obesity ([place])   . Medication management   . Other lymphedema   . Insomnia, unspecified   . Chronic kidney disease, stage III (moderate) (HCC)   . Primary open-angle glaucoma(365.11)     Past Surgical History:  Procedure Laterality Date  . ABDOMINAL HYSTERECTOMY    . EYE SURGERY       OB History   No obstetric history on file.     Family History  Problem Relation Age of Onset  . Other Mother        "  old age"  . Stomach cancer Father   . Stomach cancer Sister   . Aneurysm Brother     Social History   Tobacco Use  . Smoking status: Never Smoker  . Smokeless tobacco: Never Used  Vaping Use  . Vaping Use: Never used  Substance Use Topics  . Alcohol use: No    Alcohol/week: 0.0 standard drinks  . Drug use: No    Home Medications Prior to Admission medications   Medication Sig Start Date End Date Taking? Authorizing Provider  acetaminophen (TYLENOL) 500 MG tablet  Take 1 tablet (500 mg total) by mouth at bedtime. 12/16/19   Wille Celeste, PA-C  amiodarone (PACERONE) 200 MG tablet Take 1 tablet (200 mg total) by mouth daily. 01/31/20 03/01/20  Donne Hazel, MD  carboxymethylcellulose (ARTIFICIAL TEARS) 1 % ophthalmic solution Place 1 drop into both eyes 3 (three) times daily as needed (for lubercating eyes). 12/16/19   Granville Lewis C, PA-C  [employer] VITAMIN C 500 MG tablet Take 1 tablet (500 mg total) by mouth daily. 12/16/19   Granville Lewis C, PA-C  divalproex (DEPAKOTE) 250 MG DR tablet Take 1 tablet (250 mg total) by mouth 2 (two) times daily. 05/10/20   Marcial Pacas, MD  docusate sodium (COLACE) 50 MG capsule Take 1-2 capsules (50-100 mg total) by mouth daily as needed for mild constipation. 01/23/20   Samuella Cota, MD  Ensure Max Protein (ENSURE MAX PROTEIN) LIQD Take 330 mLs (11 oz total) by mouth daily. 01/24/20   Samuella Cota, MD  furosemide (LASIX) 20 MG tablet Take 1 tablet (20 mg total) by mouth every Monday, Wednesday, and Friday. Patient taking differently: Take 20 mg by mouth daily.  12/17/19   Granville Lewis C, PA-C  gabapentin (NEURONTIN) 100 MG capsule Take 1-2 capsules (100-200 mg total) by mouth See admin instructions. Take 100mg  in AM and 200mg  at bedtime 01/23/20   Samuella Cota, MD  Garlic 8756 MG CAPS Take 2 capsules (2,000 mg total) by mouth daily. Take 2 capsules to = 2000 mg 12/16/19   Granville Lewis C, PA-C  iron polysaccharides (NIFEREX) 150 MG capsule Take 150 mg by mouth daily.    [provider]  losartan (COZAAR) 50 MG tablet Take 50 mg by mouth daily.    [provider]  NON FORMULARY Diet Order: Regular NAS, Heart Healthy Diet, thin liquids.    [provider]  pantoprazole (PROTONIX) 40 MG tablet Take 1 tablet (40 mg total) by mouth daily. 01/23/20 03/23/20  Samuella Cota, MD  predniSONE (DELTASONE) 20 MG tablet Take 2 tablets (40 mg total) by mouth daily with breakfast. 01/24/20   Samuella Cota,  MD  spironolactone (ALDACTONE) 25 MG tablet Take 25 mg by mouth daily.    [provider]  tetrahydrozoline 0.05 % ophthalmic solution Place 1 drop into both eyes daily as needed (eye irritation). 12/16/19   Granville Lewis C, PA-C  trolamine salicylate (ASPERCREME) 10 % cream Apply 1 application topically as needed for muscle pain.    [provider]  vitamin B-12 (CYANOCOBALAMIN) 1000 MCG tablet Take 1 tablet (1,000 mcg total) by mouth daily. 12/16/19   Granville Lewis C, PA-C    Allergies    Lamotrigine, Penicillins, and Tetanus toxoid  Review of Systems   Review of Systems  Constitutional: Positive for fatigue. Negative for chills and fever.  HENT: Negative for ear pain and sore throat.   Eyes: Negative for pain and visual disturbance.  Respiratory: Negative for cough and shortness of breath.   Cardiovascular: Positive for leg swelling. Negative for chest pain and palpitations.  Gastrointestinal: Negative for abdominal pain and vomiting.  Genitourinary: Negative for dysuria and hematuria.  Musculoskeletal: Negative for arthralgias and back pain.  Skin: Negative for color change and rash.  Neurological: Positive for weakness. Negative for seizures and syncope.  All other systems reviewed and are negative.   Physical Exam Updated Vital Signs BP (!) 111/51   Pulse 69   Temp 98.5 F (36.9 C) (Oral)   Resp 15   SpO2 100%   Physical Exam Vitals and nursing note reviewed.  Constitutional:      General: She is not in acute distress.    Appearance: She is well-developed.  HENT:     Head: Normocephalic and atraumatic.  Eyes:     Conjunctiva/sclera: Conjunctivae normal.  Cardiovascular:     Rate and Rhythm: Normal rate and regular rhythm.     Heart sounds: No murmur heard.   Pulmonary:     Effort: Pulmonary effort is normal. No respiratory distress.     Breath sounds: Normal breath sounds.  Abdominal:     Palpations: Abdomen is soft.     Tenderness: There is no  abdominal tenderness.  Musculoskeletal:     Cervical back: Neck supple.     Comments: Bilateral edema to lower legs  Skin:    General: Skin is warm and dry.  Neurological:     Mental Status: She is alert.     Comments: Alert, answers basic questions appropriately, 5 out of 5 strength in upper and lower extremities, sensation to light touch intact in upper and lower extremities     ED Results / Procedures / Treatments   Labs (all labs ordered are listed, but only abnormal results are displayed) Labs Reviewed  CBC WITH DIFFERENTIAL/PLATELET - Abnormal; Notable for the following components:      Result Value   RDW 18.6 (*)    All other components within normal limits  COMPREHENSIVE METABOLIC PANEL - Abnormal; Notable for the following components:   Glucose, Bld 112 (*)    BUN 25 (*)    Creatinine, Ser 1.38 (*)    Total Protein 8.4 (*)    GFR, Estimated 34 (*)    All other components within normal limits  BRAIN NATRIURETIC PEPTIDE - Abnormal; Notable for the following components:   B Natriuretic Peptide 176.4 (*)    All other components within normal limits  AMMONIA  URINALYSIS, ROUTINE W REFLEX MICROSCOPIC  MAGNESIUM  PROTIME-INR  TROPONIN I (HIGH SENSITIVITY)  TROPONIN I (HIGH SENSITIVITY)    EKG EKG Interpretation  Date/Time:  Wednesday May 12 2020 12:59:14 EDT Ventricular Rate:  70 PR Interval:    QRS Duration: 96 QT Interval:  419 QTC Calculation: 453 R Axis:   34 Text Interpretation: Sinus rhythm Confirmed by Madalyn Rob [phone]) on 05/12/2020 1:57:37 PM   Radiology CT Head Wo Contrast  Addendum Date: 05/12/2020   ADDENDUM REPORT: 05/12/2020 15:29 ADDENDUM: These results were called by telephone at the time of interpretation on 05/12/2020 at 3:15 pm to provider [facility] , who verbally acknowledged these results. Electronically Signed   By: Kellie Simmering DO   On: 05/12/2020 15:29   Result Date: 05/12/2020 CLINICAL DATA:  Delirium. EXAM: CT HEAD  WITHOUT CONTRAST TECHNIQUE: Contiguous axial images were obtained from the base of the skull through the vertex without intravenous contrast. COMPARISON:  Brain MRI 01/21/2020.  Head CT  01/20/2020. FINDINGS: Brain: There is a 3.8 x 1.9 x 3.4 cm acute parenchymal hemorrhage within the anterior left frontal lobe (series 2, image 11) (series 5, image 23). There is mild surrounding edema. Associated mass effect with partial effacement of the left lateral ventricle frontal horn. 4 mm rightward midline shift measured at the level of the septum pellucidum. Early rightward subfalcine herniation is questioned. Probable small volume acute subarachnoid hemorrhage within the anterior interhemispheric fissure and suprasellar cistern (for instance as seen on series 2, image 10) Additionally, there is small volume acute hemorrhage dependently within both lateral ventricles, which may reflect extension from the left frontal lobe parenchymal hemorrhage (series 2, image 15). Associated mild hydrocephalus. Previously demonstrated regions of white matter edema within the high right frontal lobe, right parietooccipital lobes, left occipital lobe and right temporal lobe have decreased in conspicuity since the prior MRI of 01/21/2020. There is a small area of residual white matter edema within the high right frontoparietal white matter (series 2, image 21). No demarcated cortical infarct. No extra-axial fluid collection. No evidence of intracranial mass. Vascular: No hyperdense vessel.  Atherosclerotic calcifications. Skull: Normal. Negative for fracture or focal lesion. Sinuses/Orbits: Visualized orbits show no acute finding. Redemonstrated left-sided glaucoma valve. No significant paranasal sinus disease or mastoid effusion at the imaged levels. IMPRESSION: 3.8 x 1.9 x 3.4 cm acute parenchymal hemorrhage within the anterior left frontal lobe. Associated mass effect with partial effacement of the left lateral ventricle frontal horn. 4 mm  rightward midline shift measured at the level of the septum pellucidum. Early rightward subfalcine herniation is questioned. Probable adjacent small-volume acute subarachnoid hemorrhage within the anterior interhemispheric fissure and suprasellar cistern. Small-volume acute hemorrhage dependently within both lateral ventricles. This may reflect extension from the left frontal lobe parenchymal hemorrhage. Associated mild hydrocephalus. Previously demonstrated regions of white matter edema within the high right frontal lobe, right parietooccipital lobes, left occipital lobe and right temporal lobe have decreased in conspicuity since the prior MRI of 01/21/2020. There is a small area of residual white matter edema within the high right frontoparietal white matter. Electronically Signed: By: Kellie Simmering DO On: 05/12/2020 15:10   DG Chest Portable 1 View  Result Date: 05/12/2020 CLINICAL DATA:  Generalized weakness for 1 week EXAM: PORTABLE CHEST 1 VIEW COMPARISON:  01/28/2020 FINDINGS: Cardiac shadow is enlarged but stable. Aortic calcifications are again seen. Fullness in the right hilar region is noted again related to prominent central pulmonary artery. The overall inspiratory effort is poor with elevation of the right hemidiaphragm. Early infiltrate is noted in the right upper lobe along the minor fissure. No bony abnormality is seen. IMPRESSION: Early right upper lobe infiltrate. Electronically Signed   By: Inez Catalina M.D.   On: 05/12/2020 15:08    Procedures .Critical Care Performed by: Lucrezia Starch, MD Authorized by: Lucrezia Starch, MD   Critical care provider statement:    Critical care time (minutes):  58   Critical care was necessary to treat or prevent imminent or life-threatening deterioration of the following conditions:  CNS failure or compromise   Critical care was time spent personally by me on the following activities:  Discussions with consultants, evaluation of patient's  response to treatment, examination of patient, ordering and performing treatments and interventions, ordering and review of laboratory studies, ordering and review of radiographic studies, pulse oximetry, re-evaluation of patient's condition, obtaining history from patient or surrogate and review of old charts   (including critical care time)  Medications  Ordered in ED Medications  clevidipine (CLEVIPREX) infusion 0.5 mg/mL (16 mg/hr Intravenous Rate/Dose Change 05/12/20 1630)  LORazepam (ATIVAN) 2 MG/ML injection (has no administration in time range)  levETIRAcetam (KEPPRA) IVPB 500 mg/100 mL premix (has no administration in time range)  levETIRAcetam (KEPPRA) IVPB 1000 mg/100 mL premix (0 mg Intravenous Stopped 05/12/20 1613)  LORazepam (ATIVAN) injection 1 mg (1 mg Intravenous Given 05/12/20 1557)  LORazepam (ATIVAN) injection 1 mg (1 mg Intramuscular Given 05/12/20 1605)    ED Course  I have reviewed the triage vital signs and the nursing notes.  Pertinent labs & imaging results that were available during my care of the patient were reviewed by me and considered in my medical decision making (see chart for details).  Clinical Course as of May 12 1644  Wed May 12, 2020  1516 Notified by radiology of critical result, intraparenchymal hemorrhage with some shift, will start Cleviprex for blood pressure control, stat consult neurology   [RD]  [place] sent to neuro, RN starting cleviprex for bp control   [RD]  4665 99 second generalized seizure, resovled spontaneously will start keppra   [RD]  1611 Patient had recurrent seizures requiring Keppra, multiple doses of benzodiazepines, goals of care conversation with daughter at bedside, DNR/DNI, potentially comfort measures but okay with medications for now, would not want any invasive procedures   [RD]    Clinical Course User Index [RD] Lucrezia Starch, MD   MDM Rules/Calculators/A&P                          86 year old lady  presented to the emergency room with concern for generalized weakness.  On my initial assessment, patient was not in distress, she denied any acute medical complaints.  Her daughter provided additional history concerning for increased generalized weakness, some confusion as well as shaking episodes but no seizures at home, no loss of consciousness at home.  Some falls but no known head trauma.  CT head found large intraparenchymal hemorrhage.  Consulted to neurology, started Cleviprex for blood pressure control.  Patient had multiple seizures requiring administration of Keppra, multiple doses of Ativan.  She remains somewhat postictal in between each of these episodes. At time of transfer, BP is within goal (<160mmHg) and seizures have stopped.   Lengthy goals of care conversation with her daughter at bedside. Reports pt has no other children and she is next of kin. On her last admission she was full code; however after discussion about patient's current condition, the daughter would like to change her CODE STATUS to DNR/DNI.  She states that she is potentially open to pursuing comfort measures only but would like to discuss further with family members.    Neurology - Bartholome Bill - recommending transfer ER to ER to [facility]. He should be contacted when patient arrives to [facility] ER. [facility] to transfer patient.   Final Clinical Impression(s) / ED Diagnoses Final diagnoses:  Intraparenchymal hematoma of brain, left, without loss of consciousness, initial encounter ([place])  Confusion  Generalized weakness  Status epilepticus [facility])    Rx / DC Orders ED Discharge Orders    None       Lucrezia Starch, MD 05/12/20 1645    Lucrezia Starch, MD 05/12/20 1645

## 2020-05-12 NOTE — Progress Notes (Signed)
TOC CM referral received. Waiting disposition. ED provider will give update. [place], [place] ED TOC CM [phone]

## 2020-05-12 NOTE — ED Provider Notes (Addendum)
  Physical Exam  BP (!) 143/79   Pulse 93   Temp (!) 97 F (36.1 C) (Temporal)   Resp 20   SpO2 100%   Physical Exam  ED Course/Procedures   Clinical Course as of May 12 1722  Wed May 12, 2020  1516 Notified by radiology of critical result, intraparenchymal hemorrhage with some shift, will start Cleviprex for blood pressure control, stat consult neurology   [RD]  1541 [place] sent to neuro, RN starting cleviprex for bp control   [RD]  3664 40 second generalized seizure, resovled spontaneously will start keppra   [RD]  1611 Patient had recurrent seizures requiring Keppra, multiple doses of benzodiazepines, goals of care conversation with daughter at bedside, DNR/DNI, potentially comfort measures but okay with medications for now, would not want any invasive procedures   [RD]    Clinical Course User Index [RD] Lucrezia Starch, MD    Procedures  MDM  Patient is transferred from [facility].  Patient had a head bleed and is currently on Cleviprex drip.  I talked to neurology who evaluated patient.  Patient is very somnolent and only withdraws to pain on the right side and there is no movement on the right side.  Patient is spontaneously moving the left side.  Patient has poor gag reflex.  And a CODE STATUS discussion with daughter.  Patient is DNR.  They do not want patient to be intubated.  5:39 PM Neurology had extensive discussion with family. Family wants full comfort care. Will stop cleviprex now. They want her to go peacefully.  Patient has no IV access right now and they want to hold off on starting another IV.  Consult palliative care.  6:23 PM Talked to Dr. Rowe Pavy from palliative care.  She recommend scopolamine patch as well as oral Dilaudid and oral Ativan.  Hospitalist to admit.      Drenda Freeze, MD 05/12/20 1724    Drenda Freeze, MD 05/12/20 Jeri Lager

## 2020-05-12 NOTE — Progress Notes (Signed)
Chaplain rec'd call from nurse.  Patient had been moved from [place] for stroke care.  Chaplain met daughter and granddaughter bedside.  Daughter had sandwich given to her since she had not eaten all day.  Chaplain provided beverage.  Chaplain offered words of support and prayer.  Will continue to be available as needed. Rev. Tamsen Snider Pager [phone]

## 2020-05-12 NOTE — ED Notes (Signed)
Chaplin to bedside.

## 2020-05-12 NOTE — ED Notes (Signed)
Per family do not stick pt for another IV at this time. MD at bedside and aware talking with family about plan of care. Will not stick for another IV at this time.

## 2020-05-12 NOTE — ED Notes (Addendum)
Pt from [facility] ED via [facility] - report from [facility]  Altered about 1 week, unusre what different today  3.8 X 3.4 CM head bleed - 4 mm right shift. Pt has been unresponsive with [facility]. Pt has had 3 seizures while in ED. Got 1,000 mg of keppra at [place]. Snoring respirations.  Was taken off lasix last week. Pt started on cleviprex in [place]. Parameters were not to drop systolic below 118. Currently at 4 cc/hr. No purposeful movement or response with [facility]. Right pupil pinpoint, left unable to assess. Pt arrives on 15 L NRB   Out of hospital DNR form at bedside.

## 2020-05-12 NOTE — H&P (Signed)
History and Physical    [name] VZC:[id number] DOB: Dec 04, 1929 DOA: 05/12/2020  PCP: Audley Hose, MD   Patient coming from: Home.   I have personally briefly reviewed patient's old medical records in [place]  Chief Complaint: AMS.  HPI: [name] is a 86 y.o. female with medical history significant of community-acquired pneumonia, stage III CKD, glaucoma, impaired hearing, headache, hypertension, glucose intolerance, unspecified insomnia, memory loss, morbid obesity, history of neck pain, osteoarthrosis, lymphedema who was brought to the emergency department from [facility] ED for evaluation by neurology after presenting there with altered mental status following a fall.  This was due to a large intraparenchymal hemorrhage in the left frontal lobe.  After extensive discussion with neurology, the patient will be made comfort care per family request.  ED Course: Initial vital signs were temperature 98.5 F, pulse 72, respirations 20, blood pressure 191/77 and O2 sat 97% on room air.  The patient was started on a Cleviprex infusion, but this was subsequently discontinued after the patient arrived to [facility].  Review of Systems: As per HPI otherwise all other systems reviewed and are negative.  Past Medical History:  Diagnosis Date  . CAP (community acquired pneumonia) 09/21/2015  . Chronic kidney disease, stage III (moderate) (HCC)   . Encounter for long-term (current) use of other medications   . Glaucoma (increased eye pressure)   . Hard of hearing   . Head pain   . Hypertension   . Impaired glucose tolerance test   . Insomnia, unspecified   . Memory loss   . Morbid obesity ([place])   . Neck pain   . Osteoarthrosis, unspecified whether generalized or localized, other specified sites   . Other lymphedema   . Pain in joint, multiple sites   . Primary open-angle glaucoma(365.11)    Past Surgical History:  Procedure Laterality Date  . ABDOMINAL HYSTERECTOMY    . EYE  SURGERY     Social History  reports that she has never smoked. She has never used smokeless tobacco. She reports that she does not drink alcohol and does not use drugs.  Allergies  Allergen Reactions  . Lamotrigine Other (See Comments)    Feels "jittery".  . Penicillins Hives    Has patient had a PCN reaction causing immediate rash, facial/tongue/throat swelling, SOB or lightheadedness with hypotension: No Has patient had a PCN reaction causing severe rash involving mucus membranes or skin necrosis: No Has patient had a PCN reaction that required hospitalization No Has patient had a PCN reaction occurring within the last 10 years: No If all of the above answers are "NO", then may proceed with Cephalosporin use.  . Tetanus Toxoid Hives    Family History  Problem Relation Age of Onset  . Other Mother        "old age"  . Stomach cancer Father   . Stomach cancer Sister   . Aneurysm Brother     Prior to Admission medications   Medication Sig Start Date End Date Taking? Authorizing Provider  acetaminophen (TYLENOL) 500 MG tablet Take 1 tablet (500 mg total) by mouth at bedtime. 12/16/19  Yes Oscar La, Arlo C, PA-C  amiodarone (PACERONE) 200 MG tablet Take 1 tablet (200 mg total) by mouth daily. 01/31/20 05/12/20 Yes Donne Hazel, MD  carboxymethylcellulose (ARTIFICIAL TEARS) 1 % ophthalmic solution Place 1 drop into both eyes 3 (three) times daily as needed (for lubercating eyes). 12/16/19  Yes Oscar La, Arlo C, PA-C  clotrimazole-betamethasone (LOTRISONE)  cream Apply 1 application topically daily.  04/27/20  Yes [provider]  [employer] VITAMIN C 500 MG tablet Take 1 tablet (500 mg total) by mouth daily. 12/16/19  Yes Lassen, Arlo C, PA-C  diclofenac Sodium (VOLTAREN) 1 % GEL Apply 1 application topically 2 (two) times daily as needed (Knee pain).  04/27/20  Yes [provider]  divalproex (DEPAKOTE) 250 MG DR tablet Take 1 tablet (250 mg total) by mouth 2 (two) times daily.  05/10/20  Yes Marcial Pacas, MD  docusate sodium (COLACE) 50 MG capsule Take 1-2 capsules (50-100 mg total) by mouth daily as needed for mild constipation. 01/23/20  Yes Samuella Cota, MD  Ensure Max Protein (ENSURE MAX PROTEIN) LIQD Take 330 mLs (11 oz total) by mouth daily. 01/24/20  Yes Samuella Cota, MD  gabapentin (NEURONTIN) 100 MG capsule Take 1-2 capsules (100-200 mg total) by mouth See admin instructions. Take 100mg  in AM and 200mg  at bedtime 01/23/20  Yes Samuella Cota, MD  Garlic 0973 MG CAPS Take 2 capsules (2,000 mg total) by mouth daily. Take 2 capsules to = 2000 mg 12/16/19  Yes Lassen, Arlo C, PA-C  iron polysaccharides (NIFEREX) 150 MG capsule Take 150 mg by mouth daily.   Yes [provider]  losartan (COZAAR) 50 MG tablet Take 50 mg by mouth daily.   Yes [provider]  tetrahydrozoline 0.05 % ophthalmic solution Place 1 drop into both eyes daily as needed (eye irritation). 12/16/19  Yes Lassen, Arlo C, PA-C  trolamine salicylate (ASPERCREME) 10 % cream Apply 1 application topically as needed for muscle pain.   Yes [provider]  vitamin B-12 (CYANOCOBALAMIN) 1000 MCG tablet Take 1 tablet (1,000 mcg total) by mouth daily. 12/16/19  Yes Oscar La, Arlo C, PA-C  furosemide (LASIX) 20 MG tablet Take 1 tablet (20 mg total) by mouth every Monday, Wednesday, and Friday. Patient not taking: Reported on 05/12/2020 12/17/19   Wille Celeste, PA-C  pantoprazole (PROTONIX) 40 MG tablet Take 1 tablet (40 mg total) by mouth daily. Patient not taking: Reported on 05/12/2020 01/23/20 03/23/20  Samuella Cota, MD  predniSONE (DELTASONE) 20 MG tablet Take 2 tablets (40 mg total) by mouth daily with breakfast. Patient not taking: Reported on 05/12/2020 01/24/20   Samuella Cota, MD    Physical Exam: Vitals:   05/12/20 1724 05/12/20 1726 05/12/20 1730 05/12/20 1732  BP: (!) 147/71 (!) 143/63 134/67 (!) 148/68  Pulse: 87 80 78 76  Resp: (!) 21 14 14 14     Temp:      TempSrc:      SpO2: 96% 99% 99% 100%    Constitutional: Obtunded elderly female. Eyes: PERRL, lids and conjunctivae normal ENMT: Mucous membranes are mildly dry. Posterior pharynx clear of any exudate or lesions. Neck: normal, supple, no masses, no thyromegaly Respiratory: clear to auscultation bilaterally, no wheezing, no crackles. Normal respiratory effort. No accessory muscle use.  Cardiovascular: Regular rate and rhythm, no murmurs / rubs / gallops.  Positive lymphedema and pitting edema of the lower extremities. 2+ pedal pulses. No carotid bruits.  Abdomen: Nondistended.  No tenderness, no masses palpated. No hepatosplenomegaly. Bowel sounds positive.  Musculoskeletal: no clubbing / cyanosis. Good ROM, no contractures. Normal muscle tone.  Skin: no rashes, lesions, ulcers on limited exam. Neurologic: Obtunded.  Responds to noxious stimuli. Psychiatric: Obtunded.  Labs on Admission: I have personally reviewed following labs and imaging studies  CBC: Recent Labs  Lab 05/12/20 1431  WBC  4.0  NEUTROABS 2.7  HGB 12.8  HCT 42.3  MCV 87.6  PLT 564    Basic Metabolic Panel: Recent Labs  Lab 05/12/20 1431  NA 141  K 4.8  CL 104  CO2 26  GLUCOSE 112*  BUN 25*  CREATININE 1.38*  CALCIUM 10.1  MG 2.1   GFR: CrCl cannot be calculated (Unknown ideal weight.).  Liver Function Tests: Recent Labs  Lab 05/12/20 1431  AST 23  ALT 14  ALKPHOS 106  BILITOT 0.8  PROT 8.4*  ALBUMIN 4.3   Urine analysis:    Component Value Date/Time   COLORURINE YELLOW 05/12/2020 1529   APPEARANCEUR CLEAR 05/12/2020 1529   LABSPEC 1.011 05/12/2020 1529   PHURINE 5.0 05/12/2020 1529   GLUCOSEU NEGATIVE 05/12/2020 1529   HGBUR NEGATIVE 05/12/2020 1529   BILIRUBINUR NEGATIVE 05/12/2020 1529   KETONESUR NEGATIVE 05/12/2020 1529   PROTEINUR NEGATIVE 05/12/2020 1529   NITRITE NEGATIVE 05/12/2020 1529   LEUKOCYTESUR NEGATIVE 05/12/2020 1529   Radiological Exams on  Admission: CT Head Wo Contrast  Addendum Date: 05/12/2020   ADDENDUM REPORT: 05/12/2020 15:29 ADDENDUM: These results were called by telephone at the time of interpretation on 05/12/2020 at 3:15 pm to provider [facility] , who verbally acknowledged these results. Electronically Signed   By: Kellie Simmering DO   On: 05/12/2020 15:29   Result Date: 05/12/2020 CLINICAL DATA:  Delirium. EXAM: CT HEAD WITHOUT CONTRAST TECHNIQUE: Contiguous axial images were obtained from the base of the skull through the vertex without intravenous contrast. COMPARISON:  Brain MRI 01/21/2020.  Head CT 01/20/2020. FINDINGS: Brain: There is a 3.8 x 1.9 x 3.4 cm acute parenchymal hemorrhage within the anterior left frontal lobe (series 2, image 11) (series 5, image 23). There is mild surrounding edema. Associated mass effect with partial effacement of the left lateral ventricle frontal horn. 4 mm rightward midline shift measured at the level of the septum pellucidum. Early rightward subfalcine herniation is questioned. Probable small volume acute subarachnoid hemorrhage within the anterior interhemispheric fissure and suprasellar cistern (for instance as seen on series 2, image 10) Additionally, there is small volume acute hemorrhage dependently within both lateral ventricles, which may reflect extension from the left frontal lobe parenchymal hemorrhage (series 2, image 15). Associated mild hydrocephalus. Previously demonstrated regions of white matter edema within the high right frontal lobe, right parietooccipital lobes, left occipital lobe and right temporal lobe have decreased in conspicuity since the prior MRI of 01/21/2020. There is a small area of residual white matter edema within the high right frontoparietal white matter (series 2, image 21). No demarcated cortical infarct. No extra-axial fluid collection. No evidence of intracranial mass. Vascular: No hyperdense vessel.  Atherosclerotic calcifications. Skull: Normal.  Negative for fracture or focal lesion. Sinuses/Orbits: Visualized orbits show no acute finding. Redemonstrated left-sided glaucoma valve. No significant paranasal sinus disease or mastoid effusion at the imaged levels. IMPRESSION: 3.8 x 1.9 x 3.4 cm acute parenchymal hemorrhage within the anterior left frontal lobe. Associated mass effect with partial effacement of the left lateral ventricle frontal horn. 4 mm rightward midline shift measured at the level of the septum pellucidum. Early rightward subfalcine herniation is questioned. Probable adjacent small-volume acute subarachnoid hemorrhage within the anterior interhemispheric fissure and suprasellar cistern. Small-volume acute hemorrhage dependently within both lateral ventricles. This may reflect extension from the left frontal lobe parenchymal hemorrhage. Associated mild hydrocephalus. Previously demonstrated regions of white matter edema within the high right frontal lobe, right parietooccipital lobes, left occipital lobe and  right temporal lobe have decreased in conspicuity since the prior MRI of 01/21/2020. There is a small area of residual white matter edema within the high right frontoparietal white matter. Electronically Signed: By: Kellie Simmering DO On: 05/12/2020 15:10   DG Chest Portable 1 View  Result Date: 05/12/2020 CLINICAL DATA:  Generalized weakness for 1 week EXAM: PORTABLE CHEST 1 VIEW COMPARISON:  01/28/2020 FINDINGS: Cardiac shadow is enlarged but stable. Aortic calcifications are again seen. Fullness in the right hilar region is noted again related to prominent central pulmonary artery. The overall inspiratory effort is poor with elevation of the right hemidiaphragm. Early infiltrate is noted in the right upper lobe along the minor fissure. No bony abnormality is seen. IMPRESSION: Early right upper lobe infiltrate. Electronically Signed   By: Inez Catalina M.D.   On: 05/12/2020 15:08    EKG: Independently reviewed.    Assessment/Plan Principal Problem:   Intraparenchymal hemorrhage of brain [facility]) After discussing prognosis extensively with the family they have decided to do comfort care only. Lorazepam, fentanyl, scopolamine patch ordered for comfort.    DVT prophylaxis: Comfort care only. Code Status:   DNR. Family Communication:  Discussed extensively with her daughter and granddaughter. Disposition Plan:   Patient is from:  Home.  Anticipated DC to:  Home.  Anticipated DC date:  05/13/2020 or 05/14/2020.  Anticipated DC barriers: Clinical status/comfort measures only.  Consults called:  Neuro hospitalist team (signed off).  Admission status:  Observation/MedSurg.   Severity of Illness: Very high due to large intraparenchymal hemorrhage.  Reubin Milan MD [facility]  How to contact the [facility] Attending or Consulting provider [place] or covering provider during after hours [place], for this patient?   1. Check the care team in [facility] and look for a) attending/consulting [facility] provider listed and b) the [facility] team listed 2. Log into [email or web address] and use [place]'s universal password to access. If you do not have the password, please contact the hospital operator. 3. Locate the [facility] provider you are looking for under [facility] and page to a number that you can be directly reached. 4. If you still have difficulty reaching the provider, please page the [facility] (Director on Call) for the Hospitalists listed on amion for assistance.  05/12/2020, 7:02 PM   This document was prepared using Dragon voice recognition software and may contain some unintended transcription errors.

## 2020-05-12 NOTE — Telephone Encounter (Signed)
Pt's daughter, Ree Edman (on [place]) since she was switched to divalproex (DEPAKOTE) 250 MG DR tablet is not working. Since she has been switched 2 days ago she having shaking and incoherent. Health has gone down, now I have to do everything for her.  Before where was able to do for herself.

## 2020-05-12 NOTE — Telephone Encounter (Addendum)
I returned the call to the patient's daughter, [name].  Reports her mother experiencing the following symptoms: acute confusion, difficulty standing, arms jerking and body trembling. She feels like her mother needs to go to the ED but states she cannot get her into the car. I instructed her to call EMS. Her symptoms certainly sound like her mother is having an active seizure and she needs to be evaluated.  The daughter still feels these symptoms are related to the medication. Dr. Krista Blue has reviewed this information and also feels the patient is having seizures. Feels her current state of health is likely not being caused by the Depakote. She would like the patient to proceed to the ED.  The patient's daughter is in agreement with this plan.

## 2020-05-12 NOTE — ED Triage Notes (Signed)
EMS reports from home, generalized weakness and AMS x 1 week per daughter. Daughter states PCP took Pt off Lasix last week, edema noted to bi-lateral legs.  BP 210/110 RR 18 HR 60 Sp02 94 RA CBG 109 Temp 98.7

## 2020-05-12 NOTE — ED Notes (Signed)
Dr. Yao at bedside. 

## 2020-05-12 NOTE — ED Notes (Signed)
Per daughter pt is blind in left eye, pt has glaucoma and cataract has had surgery for this. Pt is also reportedly hard of hearing. Daughter states that today the pt came out of the bathroom and was standing naked in the hall. Daughter states that the pt doesn't not normally will not even show her legs. Daughter states that she would try to talk to pt and "it's like she didn't even here me".

## 2020-05-12 NOTE — ED Notes (Signed)
Cha[place]in paged

## 2020-05-12 NOTE — ED Notes (Signed)
Dr. Donnetta Simpers at bedside.

## 2020-05-12 NOTE — Consult Note (Signed)
NEUROLOGY CONSULTATION NOTE   Date of service: May 12, 2020 Patient Name: [name] MRN:  [id number] DOB:  01/09/1930 Reason for consult: "L frontal IPH"  History of Present Illness  [name] is a 86 y.o. female with PMH significant for amyloid angiopathy s/p steroids in june who presents with worsening confusion over the last week with jitteriness, falls and having to use a walker from a cane and being off balance. Per daughter, she was standing naked today, just was not acting herself. She was seen at ED in [facility] and found to have a L frontal IPH with some blood staining her ventricles too. SBP was significantly elevated. She also had an episode of R arm shaking concerning for a seizure for which she was loaded with Keppra. She was sent to [facility] ED for further evaluation.  On presentation to [facility] ED, patient was noted to have very poor mentation with weak gag.  She was not moving her right upper extremity or her right lower extremity.  She would only localize with her left upper extremity.  She has symmetric grimace to pain in all extremities.  She came on Cleviprex drip and her SBP would periodically go above 140s.  We discussed her case with daughter who was at the bedside and with her family over the phone and they declined aggressive measures including no intubation, no chest compressions.  Family instead opted for full comfort care measures.   ROS  Unable to obtain 2.2 AMS.  Past History   Past Medical History:  Diagnosis Date  . CAP (community acquired pneumonia) 09/21/2015  . Chronic kidney disease, stage III (moderate) (HCC)   . Encounter for long-term (current) use of other medications   . Glaucoma (increased eye pressure)   . Hard of hearing   . Head pain   . Hypertension   . Impaired glucose tolerance test   . Insomnia, unspecified   . Memory loss   . Morbid obesity ([place])   . Neck pain   . Osteoarthrosis, unspecified whether generalized  or localized, other specified sites   . Other lymphedema   . Pain in joint, multiple sites   . Primary open-angle glaucoma(365.11)    Past Surgical History:  Procedure Laterality Date  . ABDOMINAL HYSTERECTOMY    . EYE SURGERY     Family History  Problem Relation Age of Onset  . Other Mother        "old age"  . Stomach cancer Father   . Stomach cancer Sister   . Aneurysm Brother    Social History   Socioeconomic History  . Marital status: Widowed    Spouse name: Not on file  . Number of children: 1  . Years of education: nursing school  . Highest education level: Not on file  Occupational History  . Occupation: retired Corporate treasurer  Tobacco Use  . Smoking status: Never Smoker  . Smokeless tobacco: Never Used  Vaping Use  . Vaping Use: Never used  Substance and Sexual Activity  . Alcohol use: No    Alcohol/week: 0.0 standard drinks  . Drug use: No  . Sexual activity: Not on file  Other Topics Concern  . Not on file  Social History Narrative   Patient lives at home alone , widow. Patient is retired and one child. 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   Right-handed.   No daily use of caffeine.      12/04/19 -  She normally lives alone but she is currently in rehab at [facility] facility.      Social Determinants of Health   Financial Resource Strain:   . Difficulty of Paying Living Expenses: Not on file  Food Insecurity:   . Worried About Charity fundraiser in the Last Year: Not on file  . Ran Out of Food in the Last Year: Not on file  Transportation Needs:   . Lack of Transportation (Medical): Not on file  . Lack of Transportation (Non-Medical): Not on file  Physical Activity:   . Days of Exercise per Week: Not on file  . Minutes of Exercise per Session: Not on file  Stress:   . Feeling of Stress : Not on file  Social Connections:   . Frequency of Communication with Friends and Family: Not on file  . Frequency of Social Gatherings with  Friends and Family: Not on file  . Attends Religious Services: Not on file  . Active Member of Clubs or Organizations: Not on file  . Attends Archivist Meetings: Not on file  . Marital Status: Not on file   Allergies  Allergen Reactions  . Lamotrigine Other (See Comments)    Feels "jittery".  . Penicillins Hives    Has patient had a PCN reaction causing immediate rash, facial/tongue/throat swelling, SOB or lightheadedness with hypotension: No Has patient had a PCN reaction causing severe rash involving mucus membranes or skin necrosis: No Has patient had a PCN reaction that required hospitalization No Has patient had a PCN reaction occurring within the last 10 years: No If all of the above answers are "NO", then may proceed with Cephalosporin use.  . Tetanus Toxoid Hives    Medications  (Not in a hospital admission)    Vitals   Vitals:   05/12/20 1724 05/12/20 1726 05/12/20 1730 05/12/20 1732  BP: (!) 147/71 (!) 143/63 134/67 (!) 148/68  Pulse: 87 80 78 76  Resp: (!) 21 14 14 14   Temp:      TempSrc:      SpO2: 96% 99% 99% 100%     There is no height or weight on file to calculate BMI.  Physical Exam   General: Laying comfortably in bed; in no acute distress. HENT: Normal oropharynx and mucosa. Normal external appearance of ears and nose. Neck: Supple, no pain or tenderness CV: No JVD. No peripheral edema. Pulmonary: Symmetric Chest rise. Normal respiratory effort. Abdomen: Soft to touch, non-tender.  Ext: No cyanosis, edema, or deformity  Skin: No rash. Normal palpation of skin.   Musculoskeletal: Normal digits and nails by inspection. No clubbing.   Neurologic Examination  Mental status/Cognition: eyes closed, resists opening.  Does not open eyes to voice or to noxious stimuli. Pupils: Right pupil is 5 mm and reactive to light.  Left eye enucleated. Positive corneals bilaterally. Poor gag but does bite down on the depressor.  Labs   CBC:  Recent  Labs  Lab 05/12/20 1431  WBC 4.0  NEUTROABS 2.7  HGB 12.8  HCT 42.3  MCV 87.6  PLT 485    Basic Metabolic Panel:  Lab Results  Component Value Date   NA 141 05/12/2020   K 4.8 05/12/2020   CO2 26 05/12/2020   GLUCOSE 112 (H) 05/12/2020   BUN 25 (H) 05/12/2020   CREATININE 1.38 (H) 05/12/2020   CALCIUM 10.1 05/12/2020   GFRNONAA 34 (L) 05/12/2020   GFRAA 59 (L) 01/29/2020   Lipid Panel:  Lab Results  Component Value Date   LDLCALC 61 01/29/2020   HgbA1c:  Lab Results  Component Value Date   HGBA1C 5.8 (H) 11/23/2019   Urine Drug Screen:     Component Value Date/Time   LABOPIA NONE DETECTED 01/20/2020 1657   COCAINSCRNUR NONE DETECTED 01/20/2020 1657   LABBENZ NONE DETECTED 01/20/2020 1657   AMPHETMU NONE DETECTED 01/20/2020 1657   THCU NONE DETECTED 01/20/2020 1657   LABBARB NONE DETECTED 01/20/2020 1657    Alcohol Level No results found for: [facility]   Results for orders placed during the hospital encounter of 05/12/20  CT Head Wo Contrast  Addendum 05/12/2020  3:32 PM ADDENDUM REPORT: 05/12/2020 15:29  ADDENDUM: These results were called by telephone at the time of interpretation on 05/12/2020 at 3:15 pm to provider [facility] , who verbally acknowledged these results.   Electronically Signed By: Kellie Simmering DO On: 05/12/2020 15:29  Narrative CLINICAL DATA:  Delirium.  EXAM: CT HEAD WITHOUT CONTRAST  TECHNIQUE: Contiguous axial images were obtained from the base of the skull through the vertex without intravenous contrast.  COMPARISON:  Brain MRI 01/21/2020.  Head CT 01/20/2020.  FINDINGS: Brain:  There is a 3.8 x 1.9 x 3.4 cm acute parenchymal hemorrhage within the anterior left frontal lobe (series 2, image 11) (series 5, image 23). There is mild surrounding edema. Associated mass effect with partial effacement of the left lateral ventricle frontal horn. 4 mm rightward midline shift measured at the level of the septum pellucidum.  Early rightward subfalcine herniation is questioned.  Probable small volume acute subarachnoid hemorrhage within the anterior interhemispheric fissure and suprasellar cistern (for instance as seen on series 2, image 10)  Additionally, there is small volume acute hemorrhage dependently within both lateral ventricles, which may reflect extension from the left frontal lobe parenchymal hemorrhage (series 2, image 15). Associated mild hydrocephalus.  Previously demonstrated regions of white matter edema within the high right frontal lobe, right parietooccipital lobes, left occipital lobe and right temporal lobe have decreased in conspicuity since the prior MRI of 01/21/2020. There is a small area of residual white matter edema within the high right frontoparietal white matter (series 2, image 21).  No demarcated cortical infarct.  No extra-axial fluid collection.  No evidence of intracranial mass.  Vascular: No hyperdense vessel.  Atherosclerotic calcifications.  Skull: Normal. Negative for fracture or focal lesion.  Sinuses/Orbits: Visualized orbits show no acute finding. Redemonstrated left-sided glaucoma valve. No significant paranasal sinus disease or mastoid effusion at the imaged levels.  IMPRESSION: 3.8 x 1.9 x 3.4 cm acute parenchymal hemorrhage within the anterior left frontal lobe. Associated mass effect with partial effacement of the left lateral ventricle frontal horn. 4 mm rightward midline shift measured at the level of the septum pellucidum. Early rightward subfalcine herniation is questioned.  Probable adjacent small-volume acute subarachnoid hemorrhage within the anterior interhemispheric fissure and suprasellar cistern.  Small-volume acute hemorrhage dependently within both lateral ventricles. This may reflect extension from the left frontal lobe parenchymal hemorrhage. Associated mild hydrocephalus.  Previously demonstrated regions of white matter edema  within the high right frontal lobe, right parietooccipital lobes, left occipital lobe and right temporal lobe have decreased in conspicuity since the prior MRI of 01/21/2020. There is a small area of residual white matter edema within the high right frontoparietal white matter.  Electronically Signed: By: Kellie Simmering DO On: 05/12/2020 15:10    Impression   [name] is a 86 y.o. female with PMH  significant for amyloid angiopathy s/p steroids in june who presents with worsening confusion over the last week and found to have left frontal IPH.  Exam with spontaneous movement in left upper extremity and localizes with left upper and left lower extremity only.  No movement of the right upper or right lower extremity.  Spoke with family and discused that even if she does recover from this, she will likely not be able to function as she doing was prior to the admission.  Given her advanced age, intraparenchymal hemorrhage and poor exam, family opted to go full comfort care.  Family declined aggressive measures including no intubation, no chest compression, when her IV line blew, family declined placement of a new IV for blood pressure control.  Recommendations  - Family would like to pursue full comfort care, no IV lines, no tubes, no intubation, no chest compression. Neurology will signoff. Please feel free to contact us with any questions or concerns. ______________________________________________________________________   Thank you for the opportunity to take part in the care of this patient. If you have any further questions, please contact the neurology consultation attending.  Signed,  [place] Pager Number [phone]

## 2020-05-13 DIAGNOSIS — Z7401 Bed confinement status: Secondary | ICD-10-CM | POA: Diagnosis not present

## 2020-05-13 DIAGNOSIS — R58 Hemorrhage, not elsewhere classified: Secondary | ICD-10-CM | POA: Diagnosis not present

## 2020-05-13 DIAGNOSIS — R4020 Unspecified coma: Secondary | ICD-10-CM | POA: Diagnosis not present

## 2020-05-13 DIAGNOSIS — R0902 Hypoxemia: Secondary | ICD-10-CM | POA: Diagnosis not present

## 2020-05-13 DIAGNOSIS — I619 Nontraumatic intracerebral hemorrhage, unspecified: Secondary | ICD-10-CM | POA: Diagnosis not present

## 2020-05-13 DIAGNOSIS — M2550 Pain in unspecified joint: Secondary | ICD-10-CM | POA: Diagnosis not present

## 2020-05-13 DIAGNOSIS — L8990 Pressure ulcer of unspecified site, unspecified stage: Secondary | ICD-10-CM | POA: Diagnosis present

## 2020-05-13 MED ORDER — FENTANYL 25 MCG/HR TD PT72
1.0000 | MEDICATED_PATCH | TRANSDERMAL | 0 refills | Status: AC | PRN
Start: 2020-05-13 — End: 2021-05-13

## 2020-05-13 MED ORDER — LORAZEPAM 2 MG/ML PO CONC: 1.0000 mg | ORAL | 0 refills | Status: AC | PRN

## 2020-05-13 MED ORDER — SCOPOLAMINE 1 MG/3DAYS TD PT72: 1.0000 | MEDICATED_PATCH | TRANSDERMAL | 12 refills | Status: AC

## 2020-05-13 MED ORDER — ACETAMINOPHEN 650 MG RE SUPP: 650.0000 mg | Freq: Four times a day (QID) | RECTAL | 0 refills | Status: AC | PRN

## 2020-05-13 MED ORDER — MORPHINE SULFATE 20 MG/5ML PO SOLN: 5.0000 mg | ORAL | 0 refills | Status: AC | PRN

## 2020-05-13 NOTE — TOC Transition Note (Signed)
Transition of Care [facility]) - CM/SW Discharge Note   Patient Details  Name: [name] MRN: [id number] Date of Birth: August 08, 1929  Transition of Care [facility]) CM/SW Contact:  Geralynn Ochs, LCSW Phone Number: 05/13/2020, 12:22 PM   Clinical Narrative:   CSW met with patient's daughter, [name], at bedside to discuss hospice placement. CSW discussed CMS Choice, and daughter chose [facility]. CSW explained referral process and that hospice liaison will contact her to discuss placement. [name] took down CSW contact information for questions. CSW contacted [place] with [facility] to provide referral. No [facility] beds available today, but will reach out to family and follow for bed availability. CSW to follow.      Barriers to Discharge: Hospice Bed not available   Patient Goals and CMS Choice Patient states their goals for this hospitalization and ongoing recovery are:: patient unable to participate in goal setting due to disorientation CMS Medicare.gov Compare Post Acute Care list provided to:: Patient Represenative (must comment) Choice offered to / list presented to : Adult Children  Discharge Placement                       Discharge Plan and Services     Post Acute Care Choice: Hospice                               Social Determinants of Health (SDOH) Interventions     Readmission Risk Interventions No flowsheet data found.

## 2020-05-13 NOTE — Progress Notes (Addendum)
Manufacturing engineer [facility]) Hospital Liaison Note  Received request from Kathlee [facility]) for family interest in [facility]. Chart reviewed and spoke with patient daughter Solmon Ice to acknowledge referral and support. Family has [place] contact information to call for additional concerns or questions.    Unfortunately [facility] is not able to offer a room today. Family made aware.  An Barrister's clerk will follow up with TOC/family tomorrow or sooner if room becomes available.  Please do not hesitate to call with questions,   Gar Ponto, RN [facility] Liaison  [place] are on [place] at 1253: Patient family has been offered and accepted a [facility] bed for transfer today. TOC Liz aware. [facility] SW to do registeration paperwork this afternoon with family. An [facility] HLT will update Epic when paperwork completed and patient is ready to transfer.  Update at 1500: Patient family has completed [facility] paperwork and is ready for transfer. Please have bedside nurse call report to [phone] and arrange transportation to arrive at [place].  TOC aware of above

## 2020-05-13 NOTE — TOC Transition Note (Signed)
Transition of Care [facility]) - CM/SW Discharge Note   Patient Details  Name: [name] MRN: [id number] Date of Birth: [birth year]  Transition of Care [facility]) CM/SW Contact:  Geralynn Ochs, LCSW Phone Number: 05/13/2020, 2:23 PM   Clinical Narrative:   Nurse to call report to [phone].    Final next level of care: [place] Barriers to Discharge: Barriers Resolved   Patient Goals and CMS Choice Patient states their goals for this hospitalization and ongoing recovery are:: patient unable to participate in goal setting due to disorientation CMS Medicare.gov Compare Post Acute Care list provided to:: Patient Represenative (must comment) Choice offered to / list presented to : Adult Children  Discharge Placement                Patient to be transferred to facility by: [place] Name of family member notified: Felicia Patient and family notified of of transfer: 05/13/20  Discharge Plan and Services     Post Acute Care Choice: Hospice                               Social Determinants of Health (SDOH) Interventions     Readmission Risk Interventions No flowsheet data found.

## 2020-05-13 NOTE — Discharge Summary (Signed)
Physician Discharge Summary  [name] NAT:[id number] DOB: 1929/08/24 DOA: 05/12/2020  PCP: Audley Hose, MD  Admit date: 05/12/2020 Discharge date: 05/13/2020  Admitted From: Home  Disposition: [facility]  Recommendations for Outpatient Follow-up:  1. Follow up hospice provider on arrival  Discharge Condition: Grim/poor CODE STATUS: DNR Diet recommendation: After feeds as tolerates  History of present illness: [name] is a 86 year old female with past medical history notable for stage III CKD, glaucoma, impaired hearing, essential hypertension, glucose intolerance, insomnia, memory loss, morbid obesity, osteoarthritis, lymphedema who presented to the ED with encephalopathy following fall.  Patient was noted to have a large intraparenchymal hemorrhage left frontal lobe.  Seen by neurology, extensive conversations with family and patient's care was transitioned to comfort measures per family's request.  Hospital course:  Intraparenchymal hemorrhage Patient presenting from home to the ED with altered mental status and was found to have a large intraparenchymal hemorrhage left frontal lobe.  Patient was seen by neurology, given poor prognosis family had decided to transition care to comfort measures.  Patient will discharge to residential hospice, [facility].  Morphine, Ativan, scopolamine patch, fentanyl patch as needed for comfort.  Pressure injury of skin, stage II right buttock; present on admission Frequent offloading, transitioning to hospice/comfort measures as above.  Discharge Diagnoses:  Principal Problem:   Intraparenchymal hemorrhage of brain [facility]) Active Problems:   Pressure injury of skin    Discharge Instructions  Discharge Instructions    Diet - low sodium heart healthy   Complete by: As directed    Increase activity slowly   Complete by: As directed    No wound care   Complete by: As directed      Allergies as of 05/13/2020       Reactions   Lamotrigine Other (See Comments)   Feels "jittery".   Penicillins Hives   Has patient had a PCN reaction causing immediate rash, facial/tongue/throat swelling, SOB or lightheadedness with hypotension: No Has patient had a PCN reaction causing severe rash involving mucus membranes or skin necrosis: No Has patient had a PCN reaction that required hospitalization No Has patient had a PCN reaction occurring within the last 10 years: No If all of the above answers are "NO", then may proceed with Cephalosporin use.   Tetanus Toxoid Hives      Medication List    STOP taking these medications   acetaminophen 500 MG tablet Commonly known as: TYLENOL Replaced by: acetaminophen 650 MG suppository   amiodarone 200 MG tablet Commonly known as: PACERONE   Artificial Tears 1 % ophthalmic solution Generic drug: carboxymethylcellulose   clotrimazole-betamethasone cream Commonly known as: LOTRISONE   [employer] Vitamin C 500 MG tablet Generic drug: ascorbic acid   diclofenac Sodium 1 % Gel Commonly known as: VOLTAREN   divalproex 250 MG DR tablet Commonly known as: DEPAKOTE   docusate sodium 50 MG capsule Commonly known as: COLACE   Ensure Max Protein Liqd   furosemide 20 MG tablet Commonly known as: LASIX   gabapentin 100 MG capsule Commonly known as: NEURONTIN   Garlic 4270 MG Caps   iron polysaccharides 150 MG capsule Commonly known as: NIFEREX   losartan 50 MG tablet Commonly known as: COZAAR   pantoprazole 40 MG tablet Commonly known as: Protonix   predniSONE 20 MG tablet Commonly known as: DELTASONE   tetrahydrozoline 0.05 % ophthalmic solution   trolamine salicylate 10 % cream Commonly known as: ASPERCREME   vitamin B-12 1000 MCG tablet  Commonly known as: CYANOCOBALAMIN     TAKE these medications   acetaminophen 650 MG suppository Commonly known as: TYLENOL Place 1 suppository (650 mg total) rectally every 6 (six) hours as needed for mild pain (or  Fever >/= 101). Replaces: acetaminophen 500 MG tablet   fentaNYL 25 MCG/HR Commonly known as: [place] 1 patch onto the skin every 3 (three) days as needed (moderate pain).   LORazepam 2 MG/ML concentrated solution Commonly known as: ATIVAN Take 0.5 mLs (1 mg total) by mouth every 4 (four) hours as needed for anxiety or seizure.   morphine 20 MG/5ML solution Take 1.3 mLs (5.2 mg total) by mouth every 2 (two) hours as needed for up to 7 days for pain.   scopolamine 1 MG/3DAYS Commonly known as: TRANSDERM-SCOP Place 1 patch (1.5 mg total) onto the skin every 3 (three) days. Start taking on: May 15, 2020       Allergies  Allergen Reactions  . Lamotrigine Other (See Comments)    Feels "jittery".  . Penicillins Hives    Has patient had a PCN reaction causing immediate rash, facial/tongue/throat swelling, SOB or lightheadedness with hypotension: No Has patient had a PCN reaction causing severe rash involving mucus membranes or skin necrosis: No Has patient had a PCN reaction that required hospitalization No Has patient had a PCN reaction occurring within the last 10 years: No If all of the above answers are "NO", then may proceed with Cephalosporin use.  . Tetanus Toxoid Hives    Consultations:  Neurology  Palliative care  Hospice   Procedures/Studies: CT Head Wo Contrast  Addendum Date: 05/12/2020   ADDENDUM REPORT: 05/12/2020 15:29 ADDENDUM: These results were called by telephone at the time of interpretation on 05/12/2020 at 3:15 pm to provider [facility] , who verbally acknowledged these results. Electronically Signed   By: Kellie Simmering DO   On: 05/12/2020 15:29   Result Date: 05/12/2020 CLINICAL DATA:  Delirium. EXAM: CT HEAD WITHOUT CONTRAST TECHNIQUE: Contiguous axial images were obtained from the base of the skull through the vertex without intravenous contrast. COMPARISON:  Brain MRI 01/21/2020.  Head CT 01/20/2020. FINDINGS: Brain: There is a 3.8  x 1.9 x 3.4 cm acute parenchymal hemorrhage within the anterior left frontal lobe (series 2, image 11) (series 5, image 23). There is mild surrounding edema. Associated mass effect with partial effacement of the left lateral ventricle frontal horn. 4 mm rightward midline shift measured at the level of the septum pellucidum. Early rightward subfalcine herniation is questioned. Probable small volume acute subarachnoid hemorrhage within the anterior interhemispheric fissure and suprasellar cistern (for instance as seen on series 2, image 10) Additionally, there is small volume acute hemorrhage dependently within both lateral ventricles, which may reflect extension from the left frontal lobe parenchymal hemorrhage (series 2, image 15). Associated mild hydrocephalus. Previously demonstrated regions of white matter edema within the high right frontal lobe, right parietooccipital lobes, left occipital lobe and right temporal lobe have decreased in conspicuity since the prior MRI of 01/21/2020. There is a small area of residual white matter edema within the high right frontoparietal white matter (series 2, image 21). No demarcated cortical infarct. No extra-axial fluid collection. No evidence of intracranial mass. Vascular: No hyperdense vessel.  Atherosclerotic calcifications. Skull: Normal. Negative for fracture or focal lesion. Sinuses/Orbits: Visualized orbits show no acute finding. Redemonstrated left-sided glaucoma valve. No significant paranasal sinus disease or mastoid effusion at the imaged levels. IMPRESSION: 3.8 x 1.9 x 3.4 cm acute parenchymal  hemorrhage within the anterior left frontal lobe. Associated mass effect with partial effacement of the left lateral ventricle frontal horn. 4 mm rightward midline shift measured at the level of the septum pellucidum. Early rightward subfalcine herniation is questioned. Probable adjacent small-volume acute subarachnoid hemorrhage within the anterior interhemispheric  fissure and suprasellar cistern. Small-volume acute hemorrhage dependently within both lateral ventricles. This may reflect extension from the left frontal lobe parenchymal hemorrhage. Associated mild hydrocephalus. Previously demonstrated regions of white matter edema within the high right frontal lobe, right parietooccipital lobes, left occipital lobe and right temporal lobe have decreased in conspicuity since the prior MRI of 01/21/2020. There is a small area of residual white matter edema within the high right frontoparietal white matter. Electronically Signed: By: Kellie Simmering DO On: 05/12/2020 15:10   DG Chest Portable 1 View  Result Date: 05/12/2020 CLINICAL DATA:  Generalized weakness for 1 week EXAM: PORTABLE CHEST 1 VIEW COMPARISON:  01/28/2020 FINDINGS: Cardiac shadow is enlarged but stable. Aortic calcifications are again seen. Fullness in the right hilar region is noted again related to prominent central pulmonary artery. The overall inspiratory effort is poor with elevation of the right hemidiaphragm. Early infiltrate is noted in the right upper lobe along the minor fissure. No bony abnormality is seen. IMPRESSION: Early right upper lobe infiltrate. Electronically Signed   By: Inez Catalina M.D.   On: 05/12/2020 15:08   DG Knee AP/LAT W/[place] Left  Result Date: 04/21/2020 CLINICAL DATA:  Knee pain following fall yesterday, initial encounter EXAM: LEFT KNEE 3 VIEWS COMPARISON:  03/27/2017 FINDINGS: Tricompartmental degenerative changes are noted. No acute fracture is seen. No joint effusion is noted. IMPRESSION: Tricompartmental degenerative changes are again seen. No acute bony abnormality is noted. Electronically Signed   By: Inez Catalina M.D.   On: 04/21/2020 19:08   DG Knee AP/LAT W/Sunrise Right  Result Date: 04/21/2020 CLINICAL DATA:  Fall 1 day ago with right knee pain, initial encounter EXAM: RIGHT KNEE 3 VIEWS COMPARISON:  None. FINDINGS: Tricompartmental degenerative changes are  noted. No acute fracture or dislocation is seen. No joint effusion is noted. Diffuse vascular calcifications are seen. IMPRESSION: Degenerative change without acute abnormality. Electronically Signed   By: Inez Catalina M.D.   On: 04/21/2020 19:07      Subjective: Patient seen and examined at bedside, family present.  Resting comfortably.  Plan to discharge to residential hospice today.  No acute concerns per nursing staff.  Discharge Exam: Vitals:   05/13/20 0116 05/13/20 0734  BP: (!) 158/80 (!) 157/70  Pulse: 80 61  Resp: 19 20  Temp: 98.5 F (36.9 C) 98.8 F (37.1 C)  SpO2: 100% 100%   Vitals:   05/12/20 2230 05/13/20 0045 05/13/20 0116 05/13/20 0734  BP:   (!) 158/80 (!) 157/70  Pulse: 82 73 80 61  Resp: (!) 22 16 19 20   Temp:   98.5 F (36.9 C) 98.8 F (37.1 C)  TempSrc:   Oral Axillary  SpO2: 100% 100% 100% 100%    General: Pt sleeping, not in acute distress Cardiovascular: RRR, S1/S2 +, no rubs, no gallops Respiratory: CTA bilaterally, no wheezing, no rhonchi Abdominal: Soft, nondistended, bowel sounds + Extremities: no edema, no cyanosis    The results of significant diagnostics from this hospitalization (including imaging, microbiology, ancillary and laboratory) are listed below for reference.     Microbiology: Recent Results (from the past 240 hour(s))  Respiratory Panel by RT PCR (Flu A&B, Covid) - Nasopharyngeal Swab  Status: None   Collection Time: 05/12/20  5:11 PM   Specimen: Nasopharyngeal Swab  Result Value Ref Range Status   SARS Coronavirus 2 by RT PCR NEGATIVE NEGATIVE Final    Comment: (NOTE) SARS-CoV-2 target nucleic acids are NOT DETECTED.  The SARS-CoV-2 RNA is generally detectable in upper respiratoy specimens during the acute phase of infection. The lowest concentration of SARS-CoV-2 viral copies this assay can detect is 131 copies/mL. A negative result does not preclude SARS-Cov-2 infection and should not be used as the sole basis  for treatment or other patient management decisions. A negative result may occur with  improper specimen collection/handling, submission of specimen other than nasopharyngeal swab, presence of viral mutation(s) within the areas targeted by this assay, and inadequate number of viral copies (<131 copies/mL). A negative result must be combined with clinical observations, patient history, and epidemiological information. The expected result is Negative.  Fact Sheet for Patients:  PinkCheek.be  Fact Sheet for Healthcare Providers:  GravelBags.it  This test is no t yet approved or cleared by the [place] FDA and  has been authorized for detection and/or diagnosis of SARS-CoV-2 by FDA under an Emergency Use Authorization (EUA). This EUA will remain  in effect (meaning this test can be used) for the duration of the COVID-19 declaration under Section 564(b)(1) of the Act, 21 U.S.C. section 360bbb-3(b)(1), unless the authorization is terminated or revoked sooner.     Influenza A by PCR NEGATIVE NEGATIVE Final   Influenza B by PCR NEGATIVE NEGATIVE Final    Comment: (NOTE) The Xpert Xpress SARS-CoV-2/FLU/RSV assay is intended as an aid in  the diagnosis of influenza from Nasopharyngeal swab specimens and  should not be used as a sole basis for treatment. Nasal washings and  aspirates are unacceptable for Xpert Xpress SARS-CoV-2/FLU/RSV  testing.  Fact Sheet for Patients: PinkCheek.be  Fact Sheet for Healthcare Providers: GravelBags.it  This test is not yet approved or cleared by the [place] FDA and  has been authorized for detection and/or diagnosis of SARS-CoV-2 by  FDA under an Emergency Use Authorization (EUA). This EUA will remain  in effect (meaning this test can be used) for the duration of the  Covid-19 declaration under Section 564(b)(1) of the Act, 21   U.S.C. section 360bbb-3(b)(1), unless the authorization is  terminated or revoked. Performed at [facility], [place] [address] [place], [place] [address]      Labs: BNP (last 3 results) Recent Labs    11/18/19 1405 01/28/20 1703 05/12/20 1431  BNP 296.3* 375.5* 213.0*   Basic Metabolic Panel: Recent Labs  Lab 05/12/20 1431  NA 141  K 4.8  CL 104  CO2 26  GLUCOSE 112*  BUN 25*  CREATININE 1.38*  CALCIUM 10.1  MG 2.1   Liver Function Tests: Recent Labs  Lab 05/12/20 1431  AST 23  ALT 14  ALKPHOS 106  BILITOT 0.8  PROT 8.4*  ALBUMIN 4.3   No results for input(s): LIPASE, AMYLASE in the last 168 hours. Recent Labs  Lab 05/12/20 1431  AMMONIA 13   CBC: Recent Labs  Lab 05/12/20 1431  WBC 4.0  NEUTROABS 2.7  HGB 12.8  HCT 42.3  MCV 87.6  PLT 212   Cardiac Enzymes: No results for input(s): CKTOTAL, CKMB, CKMBINDEX, TROPONINI in the last 168 hours. BNP: Invalid input(s): POCBNP CBG: No results for input(s): GLUCAP in the last 168 hours. D-Dimer No results for input(s): DDIMER in the last 72 hours. Hgb A1c  No results for input(s): HGBA1C in the last 72 hours. Lipid Profile No results for input(s): CHOL, HDL, LDLCALC, TRIG, CHOLHDL, LDLDIRECT in the last 72 hours. Thyroid function studies No results for input(s): TSH, T4TOTAL, T3FREE, THYROIDAB in the last 72 hours.  Invalid input(s): FREET3 Anemia work up No results for input(s): VITAMINB12, FOLATE, FERRITIN, TIBC, IRON, RETICCTPCT in the last 72 hours. Urinalysis    Component Value Date/Time   COLORURINE YELLOW 05/12/2020 1529   APPEARANCEUR CLEAR 05/12/2020 1529   LABSPEC 1.011 05/12/2020 1529   PHURINE 5.0 05/12/2020 1529   GLUCOSEU NEGATIVE 05/12/2020 1529   HGBUR NEGATIVE 05/12/2020 1529   BILIRUBINUR NEGATIVE 05/12/2020 1529   KETONESUR NEGATIVE 05/12/2020 1529   PROTEINUR NEGATIVE 05/12/2020 1529   NITRITE NEGATIVE 05/12/2020 1529   LEUKOCYTESUR NEGATIVE 05/12/2020 1529    Sepsis Labs Invalid input(s): PROCALCITONIN,  WBC,  LACTICIDVEN Microbiology Recent Results (from the past 240 hour(s))  Respiratory Panel by RT PCR (Flu A&B, Covid) - Nasopharyngeal Swab     Status: None   Collection Time: 05/12/20  5:11 PM   Specimen: Nasopharyngeal Swab  Result Value Ref Range Status   SARS Coronavirus 2 by RT PCR NEGATIVE NEGATIVE Final    Comment: (NOTE) SARS-CoV-2 target nucleic acids are NOT DETECTED.  The SARS-CoV-2 RNA is generally detectable in upper respiratoy specimens during the acute phase of infection. The lowest concentration of SARS-CoV-2 viral copies this assay can detect is 131 copies/mL. A negative result does not preclude SARS-Cov-2 infection and should not be used as the sole basis for treatment or other patient management decisions. A negative result may occur with  improper specimen collection/handling, submission of specimen other than nasopharyngeal swab, presence of viral mutation(s) within the areas targeted by this assay, and inadequate number of viral copies (<131 copies/mL). A negative result must be combined with clinical observations, patient history, and epidemiological information. The expected result is Negative.  Fact Sheet for Patients:  PinkCheek.be  Fact Sheet for Healthcare Providers:  GravelBags.it  This test is no t yet approved or cleared by the [place] FDA and  has been authorized for detection and/or diagnosis of SARS-CoV-2 by FDA under an Emergency Use Authorization (EUA). This EUA will remain  in effect (meaning this test can be used) for the duration of the COVID-19 declaration under Section 564(b)(1) of the Act, 21 U.S.C. section 360bbb-3(b)(1), unless the authorization is terminated or revoked sooner.     Influenza A by PCR NEGATIVE NEGATIVE Final   Influenza B by PCR NEGATIVE NEGATIVE Final    Comment: (NOTE) The Xpert Xpress  SARS-CoV-2/FLU/RSV assay is intended as an aid in  the diagnosis of influenza from Nasopharyngeal swab specimens and  should not be used as a sole basis for treatment. Nasal washings and  aspirates are unacceptable for Xpert Xpress SARS-CoV-2/FLU/RSV  testing.  Fact Sheet for Patients: PinkCheek.be  Fact Sheet for Healthcare Providers: GravelBags.it  This test is not yet approved or cleared by the [place] FDA and  has been authorized for detection and/or diagnosis of SARS-CoV-2 by  FDA under an Emergency Use Authorization (EUA). This EUA will remain  in effect (meaning this test can be used) for the duration of the  Covid-19 declaration under Section 564(b)(1) of the Act, 21  U.S.C. section 360bbb-3(b)(1), unless the authorization is  terminated or revoked. Performed at [facility], [place] [address] [place], [place] [address]      Time coordinating discharge: Over 30 minutes  SIGNED:  Ruth Tully J [place] DO  [facility] 05/13/2020, 1:17 PM

## 2020-05-13 NOTE — Progress Notes (Signed)
STROKE TEAM PROGRESS NOTE   INTERVAL HISTORY Her daughter and granddaughter are at the bedside.  Patient presented with altered mental status with large left frontal parenchymal hemorrhage likely from amyloid angiopathy.  She has poor baseline from prior strokes and multiple medical issues and family has decided to make her comfort care measures only and I am in agreement with the plan.  She is resting comfortably in bed not in distress.  Vitals:   05/12/20 2230 05/13/20 0045 05/13/20 0116 05/13/20 0734  BP:   (!) 158/80 (!) 157/70  Pulse: 82 73 80 61  Resp: (!) 22 16 19 20   Temp:   98.5 F (36.9 C) 98.8 F (37.1 C)  TempSrc:   Oral Axillary  SpO2: 100% 100% 100% 100%   CBC:  Recent Labs  Lab 05/12/20 1431  WBC 4.0  NEUTROABS 2.7  HGB 12.8  HCT 42.3  MCV 87.6  PLT 789   Basic Metabolic Panel:  Recent Labs  Lab 05/12/20 1431  NA 141  K 4.8  CL 104  CO2 26  GLUCOSE 112*  BUN 25*  CREATININE 1.38*  CALCIUM 10.1  MG 2.1    IMAGING past 24 hours CT Head Wo Contrast  Addendum Date: 05/12/2020   ADDENDUM REPORT: 05/12/2020 15:29 ADDENDUM: These results were called by telephone at the time of interpretation on 05/12/2020 at 3:15 pm to provider [facility] , who verbally acknowledged these results. Electronically Signed   By: Kellie Simmering DO   On: 05/12/2020 15:29   Result Date: 05/12/2020 CLINICAL DATA:  Delirium. EXAM: CT HEAD WITHOUT CONTRAST TECHNIQUE: Contiguous axial images were obtained from the base of the skull through the vertex without intravenous contrast. COMPARISON:  Brain MRI 01/21/2020.  Head CT 01/20/2020. FINDINGS: Brain: There is a 3.8 x 1.9 x 3.4 cm acute parenchymal hemorrhage within the anterior left frontal lobe (series 2, image 11) (series 5, image 23). There is mild surrounding edema. Associated mass effect with partial effacement of the left lateral ventricle frontal horn. 4 mm rightward midline shift measured at the level of the septum pellucidum.  Early rightward subfalcine herniation is questioned. Probable small volume acute subarachnoid hemorrhage within the anterior interhemispheric fissure and suprasellar cistern (for instance as seen on series 2, image 10) Additionally, there is small volume acute hemorrhage dependently within both lateral ventricles, which may reflect extension from the left frontal lobe parenchymal hemorrhage (series 2, image 15). Associated mild hydrocephalus. Previously demonstrated regions of white matter edema within the high right frontal lobe, right parietooccipital lobes, left occipital lobe and right temporal lobe have decreased in conspicuity since the prior MRI of 01/21/2020. There is a small area of residual white matter edema within the high right frontoparietal white matter (series 2, image 21). No demarcated cortical infarct. No extra-axial fluid collection. No evidence of intracranial mass. Vascular: No hyperdense vessel.  Atherosclerotic calcifications. Skull: Normal. Negative for fracture or focal lesion. Sinuses/Orbits: Visualized orbits show no acute finding. Redemonstrated left-sided glaucoma valve. No significant paranasal sinus disease or mastoid effusion at the imaged levels. IMPRESSION: 3.8 x 1.9 x 3.4 cm acute parenchymal hemorrhage within the anterior left frontal lobe. Associated mass effect with partial effacement of the left lateral ventricle frontal horn. 4 mm rightward midline shift measured at the level of the septum pellucidum. Early rightward subfalcine herniation is questioned. Probable adjacent small-volume acute subarachnoid hemorrhage within the anterior interhemispheric fissure and suprasellar cistern. Small-volume acute hemorrhage dependently within both lateral ventricles. This may reflect extension from  the left frontal lobe parenchymal hemorrhage. Associated mild hydrocephalus. Previously demonstrated regions of white matter edema within the high right frontal lobe, right parietooccipital  lobes, left occipital lobe and right temporal lobe have decreased in conspicuity since the prior MRI of 01/21/2020. There is a small area of residual white matter edema within the high right frontoparietal white matter. Electronically Signed: By: Kellie Simmering DO On: 05/12/2020 15:10   DG Chest Portable 1 View  Result Date: 05/12/2020 CLINICAL DATA:  Generalized weakness for 1 week EXAM: PORTABLE CHEST 1 VIEW COMPARISON:  01/28/2020 FINDINGS: Cardiac shadow is enlarged but stable. Aortic calcifications are again seen. Fullness in the right hilar region is noted again related to prominent central pulmonary artery. The overall inspiratory effort is poor with elevation of the right hemidiaphragm. Early infiltrate is noted in the right upper lobe along the minor fissure. No bony abnormality is seen. IMPRESSION: Early right upper lobe infiltrate. Electronically Signed   By: Inez Catalina M.D.   On: 05/12/2020 15:08    PHYSICAL EXAM Frail elderly African-American lady who appears not to be in distress. . Afebrile. Head is nontraumatic. Neck is supple without bruit.    Cardiac exam no murmur or gallop. Lungs are clear to auscultation. Distal pulses are well felt. Neurological Exam : Eyes are closed.  She has some resistance to eye opening.  There is left corneal opacity.  Right pupil 4 to 5 mm sluggishly reactive.  Left cannot test.  Doll's eye movements are sluggish.  Tongue is midline.  She does not follow commands but makes occasional guttural sounds and stimulated.  She has trace withdrawal to noxious stimuli on the left side and less on the right.  Tone is diminished on the right.  Reflexes are depressed on the right.  Plantars are mute bilaterally.  Gait not tested.   ASSESSMENT/PLAN [name] is a 86 y.o. female with history of community-acquired pneumonia, stage III CKD, glaucoma, impaired hearing, headache, hypertension, glucose intolerance, unspecified insomnia, memory loss, morbid obesity,  neck pain, osteoarthrosis, lymphedema  presenting to [facility] ED with altered mental status s/p fall.   Stroke:   L frontal lobe IPH w/ resultant IVH, SAH and edema w/ subfalcine herniation and hydrocephalus in setting of known CAA  CT head acute L frontal lobe IPH w/ mass effect and partial effacement L lateral ventricle frontal horn w/ 52mm midline shift, early R subfalcine herniation. Probable small SAH w/ anterior interhemispheric fissure and suprasellar cistern. Small B IVH w/ mild hydrocephalus.  White matter edema, overall decreased w/ some left in high R frontoparietal white matter.   No antithrombotic prior to admission given CAA  Dr. Leonie Man discussed ongoing palliative care with family and they are agreeable, continue full comfort care  Disposition:  Pending, [place] full today  Palliative care consult in place  Stroke team will sign off  Seizure   Secondary to IPH onset  Treated w/ Keppra  Hypertension  Dysphagia . Secondary to stroke . NPO   Other Stroke Risk Factors  Advanced age  Morbid Obesity  Other Active Problems  CKD stage III  Glaucoma  Cognitive decline, memory loss  Hospital day # 0 Patient has presented with large intracerebral hemorrhage and given previous history of strokes and poor neurological baseline palliative care approach and doing comfort care is appropriate.  And long discussion the patient daughter and granddaughter and answered questions and they seem comfortable with the decision.  Agree with transfer to [facility] nursing home when bed becomes available.  Greater than 50% time during this 25-minute visit was spent on counseling and coordination of care and discussion with care team.  Stroke team will sign off.  Can call for questions.  To contact Stroke Continuity provider, please refer to [email or web address] After hours, contact General Neurology

## 2020-05-25 ENCOUNTER — Ambulatory Visit: Payer: Medicare Other | Admitting: Podiatry

## 2020-06-01 ENCOUNTER — Ambulatory Visit: Payer: Medicare Other | Admitting: Neurology

## 2020-06-10 ENCOUNTER — Ambulatory Visit: Payer: Medicare Other

## 2020-06-10 ENCOUNTER — Other Ambulatory Visit: Payer: Medicare Other

## 2020-06-30 DEATH — deceased

## 2021-11-14 IMAGING — CT CT ANGIO CHEST
2 of 7 series · 18 of 46 positions shown · IV contrast (OMNIPAQUE)
Comparison: 09/23/2015

CLINICAL DATA: Extremity edema, elevated D-dimer

EXAM:
CT ANGIOGRAPHY CHEST WITH CONTRAST
TECHNIQUE: Multidetector CT imaging of the chest was performed using the
standard protocol during bolus administration of intravenous
contrast. Multiplanar CT image reconstructions and MIPs were
obtained to evaluate the vascular anatomy.
CONTRAST:  100mL OMNIPAQUE IOHEXOL 350 MG/ML SOLN

[Series 7: thins · axial · 0.57mm/px · z∈[+1383,+1638]mm · 16 of 289 slices shown]
[im 17/289  lung]
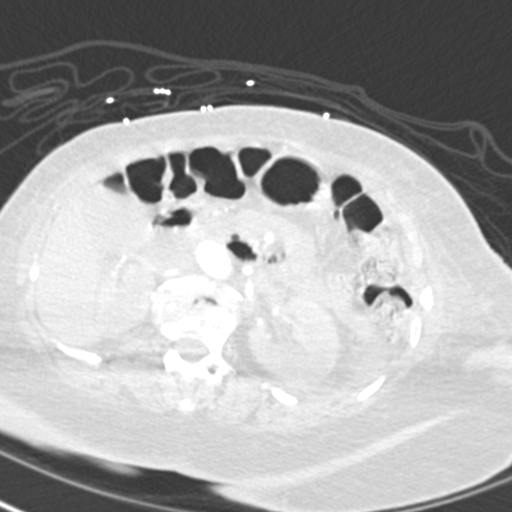
[im 34/289  soft-tissue]
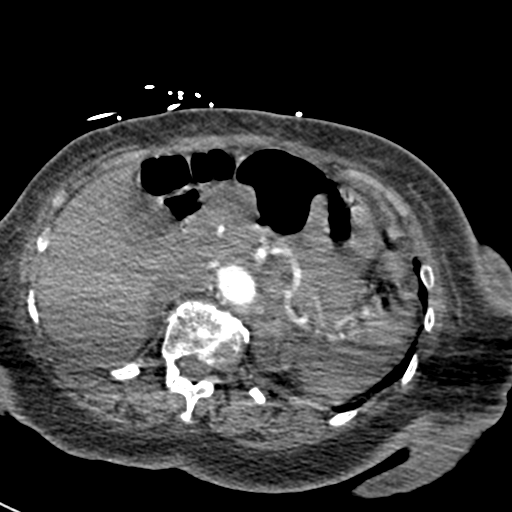
[im 51/289  lung]
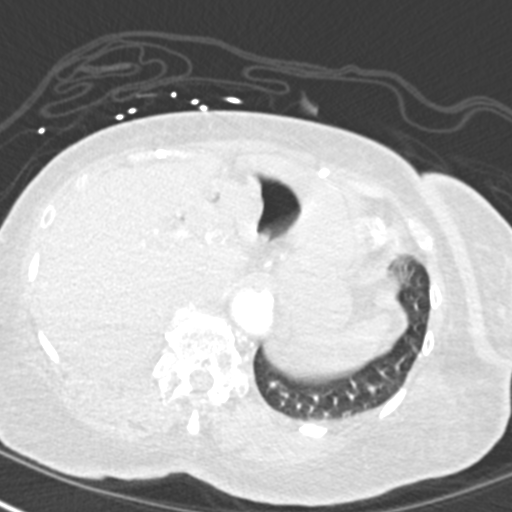
[im 68/289  soft-tissue]
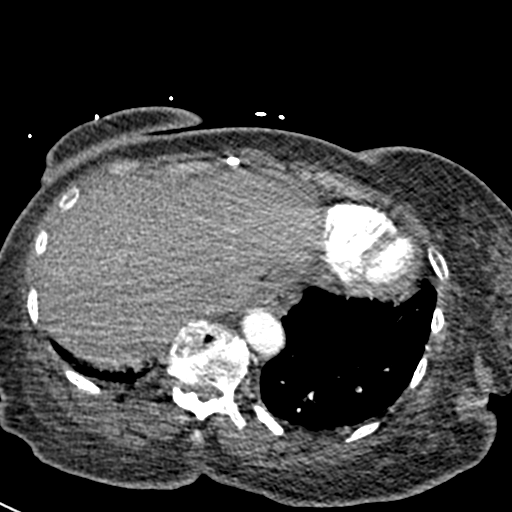
[im 85/289  lung]
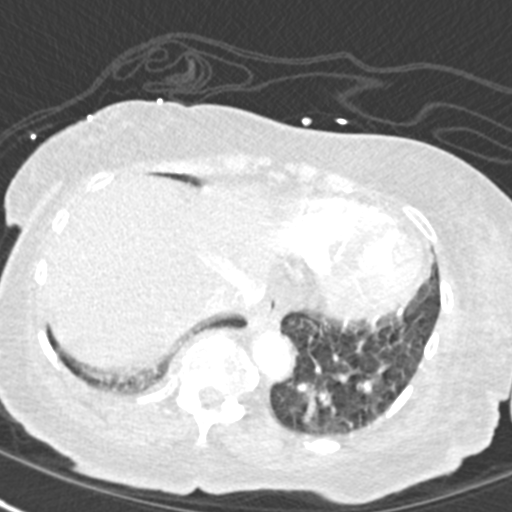
[im 102/289  soft-tissue]
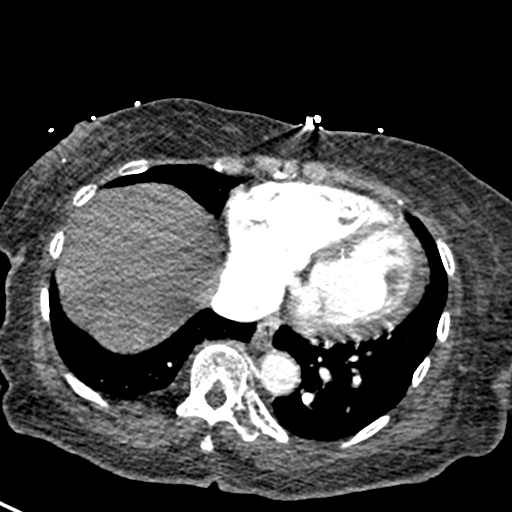
[im 119/289  lung]
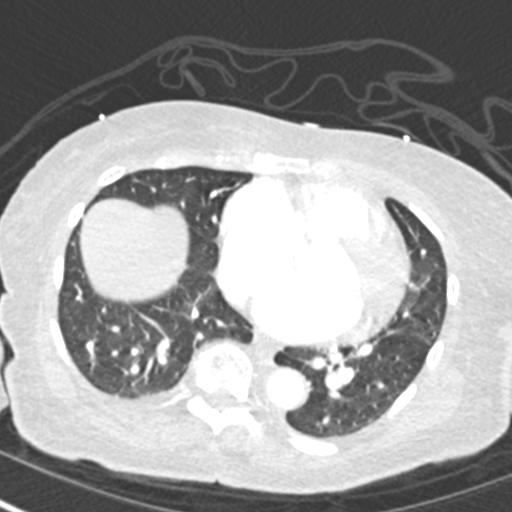
[im 136/289  soft-tissue]
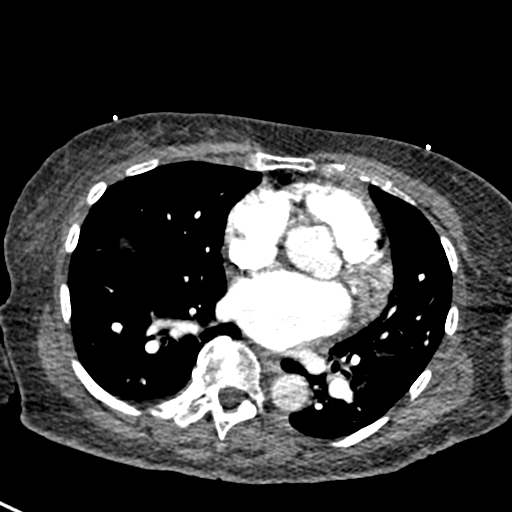
[im 153/289  lung]
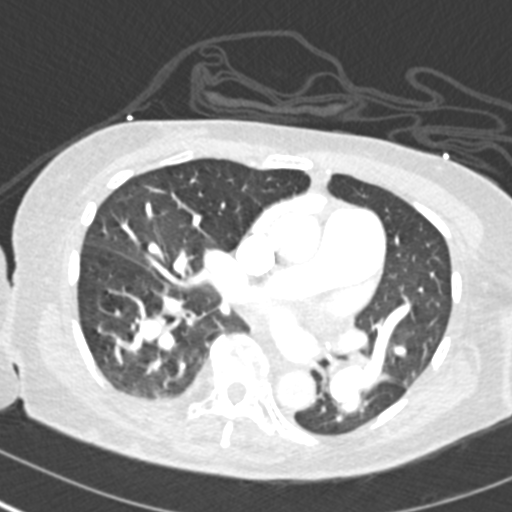
[im 170/289  soft-tissue]
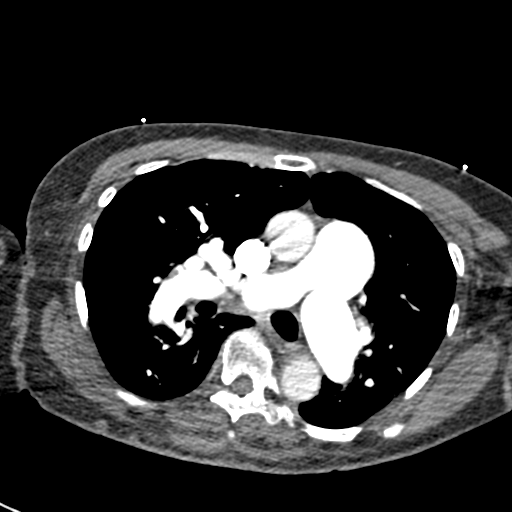
[im 187/289  lung]
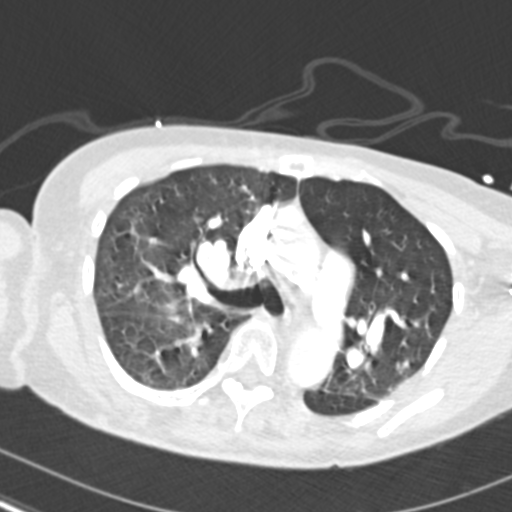
[im 204/289  soft-tissue]
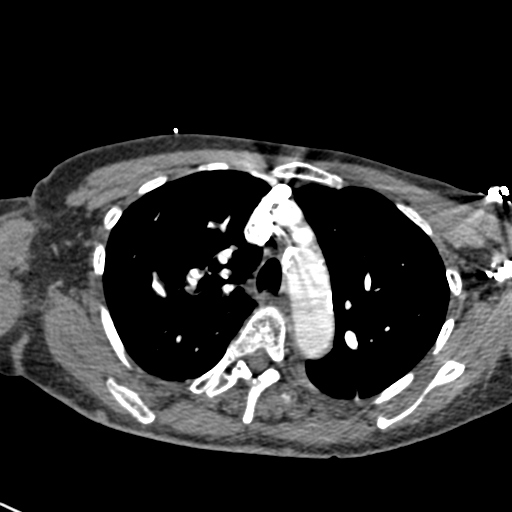
[im 221/289  lung]
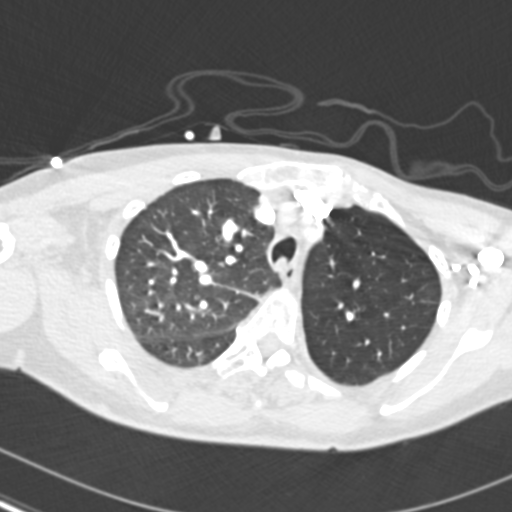
[im 238/289  soft-tissue]
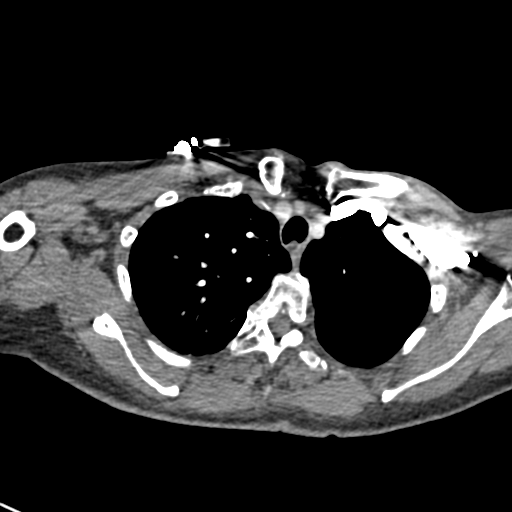
[im 255/289  lung]
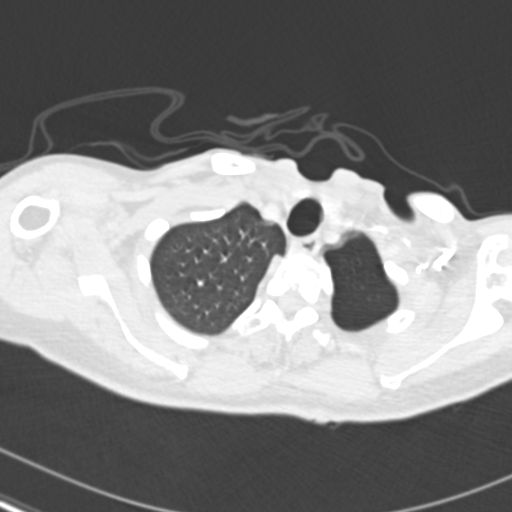
[im 272/289  soft-tissue]
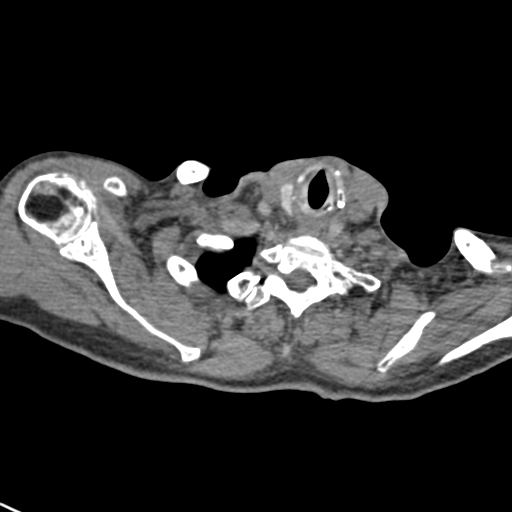

[Series 9: coronal mpr · coronal · 0.54mm/px · 2 of 66 slices shown]
[im 22/66  soft-tissue]
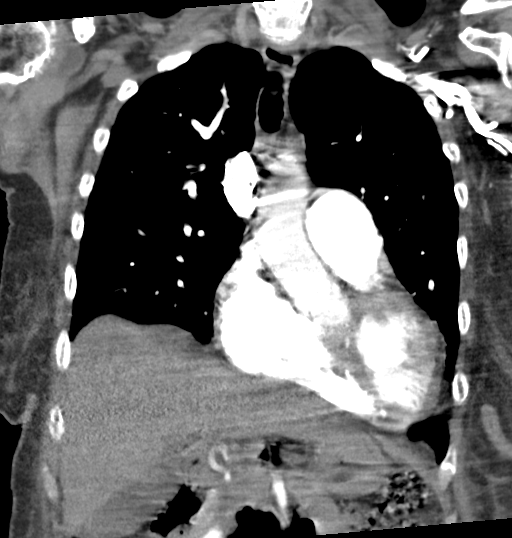
[im 44/66  soft-tissue]
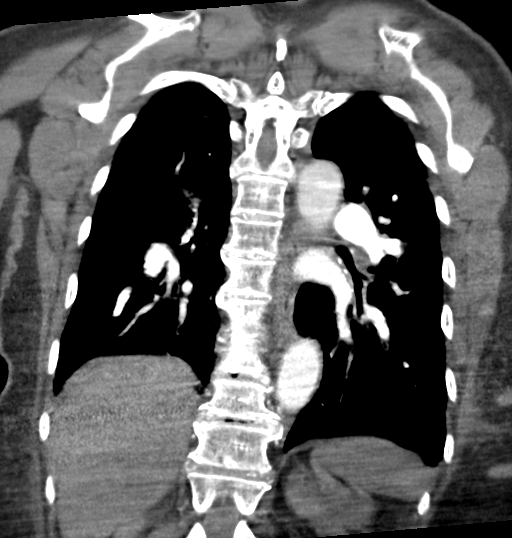

[18 of 46 positions shown; findings below may reference images not displayed]

FINDINGS: Cardiovascular: No filling defects in the pulmonary arteries to
suggest pulmonary emboli. Heart is enlarged. Scattered aortic
calcifications. No evidence of aortic aneurysm. Prominent central
pulmonary arteries. Main pulmonary artery measures 38 mm. Findings
compatible with pulmonary arterial hypertension.

Mediastinum/Nodes: No mediastinal, hilar, or axillary adenopathy.
Trachea and esophagus are unremarkable. Thyroid unremarkable.

Lungs/Pleura: Mild elevation of the right hemidiaphragm. No
confluent opacities, effusions or edema.

Upper Abdomen: Imaging into the upper abdomen shows no acute
findings.

Musculoskeletal: No acute bony abnormality. Chest wall soft tissues
are unremarkable.

Review of the MIP images confirms the above findings.
IMPRESSION: Cardiomegaly.  Pulmonary arterial hypertension.

No evidence of pulmonary embolus.

Aortic Atherosclerosis ([id number]).

## 2022-04-17 IMAGING — DX DG KNEE AP/LAT W/ SUNRISE*L*
3 series · 3 of 3 positions shown · non-contrast
Comparison: 03/27/2017

CLINICAL DATA: Knee pain following fall yesterday, initial
encounter

EXAM:
LEFT KNEE 3 VIEWS

[knee ap]
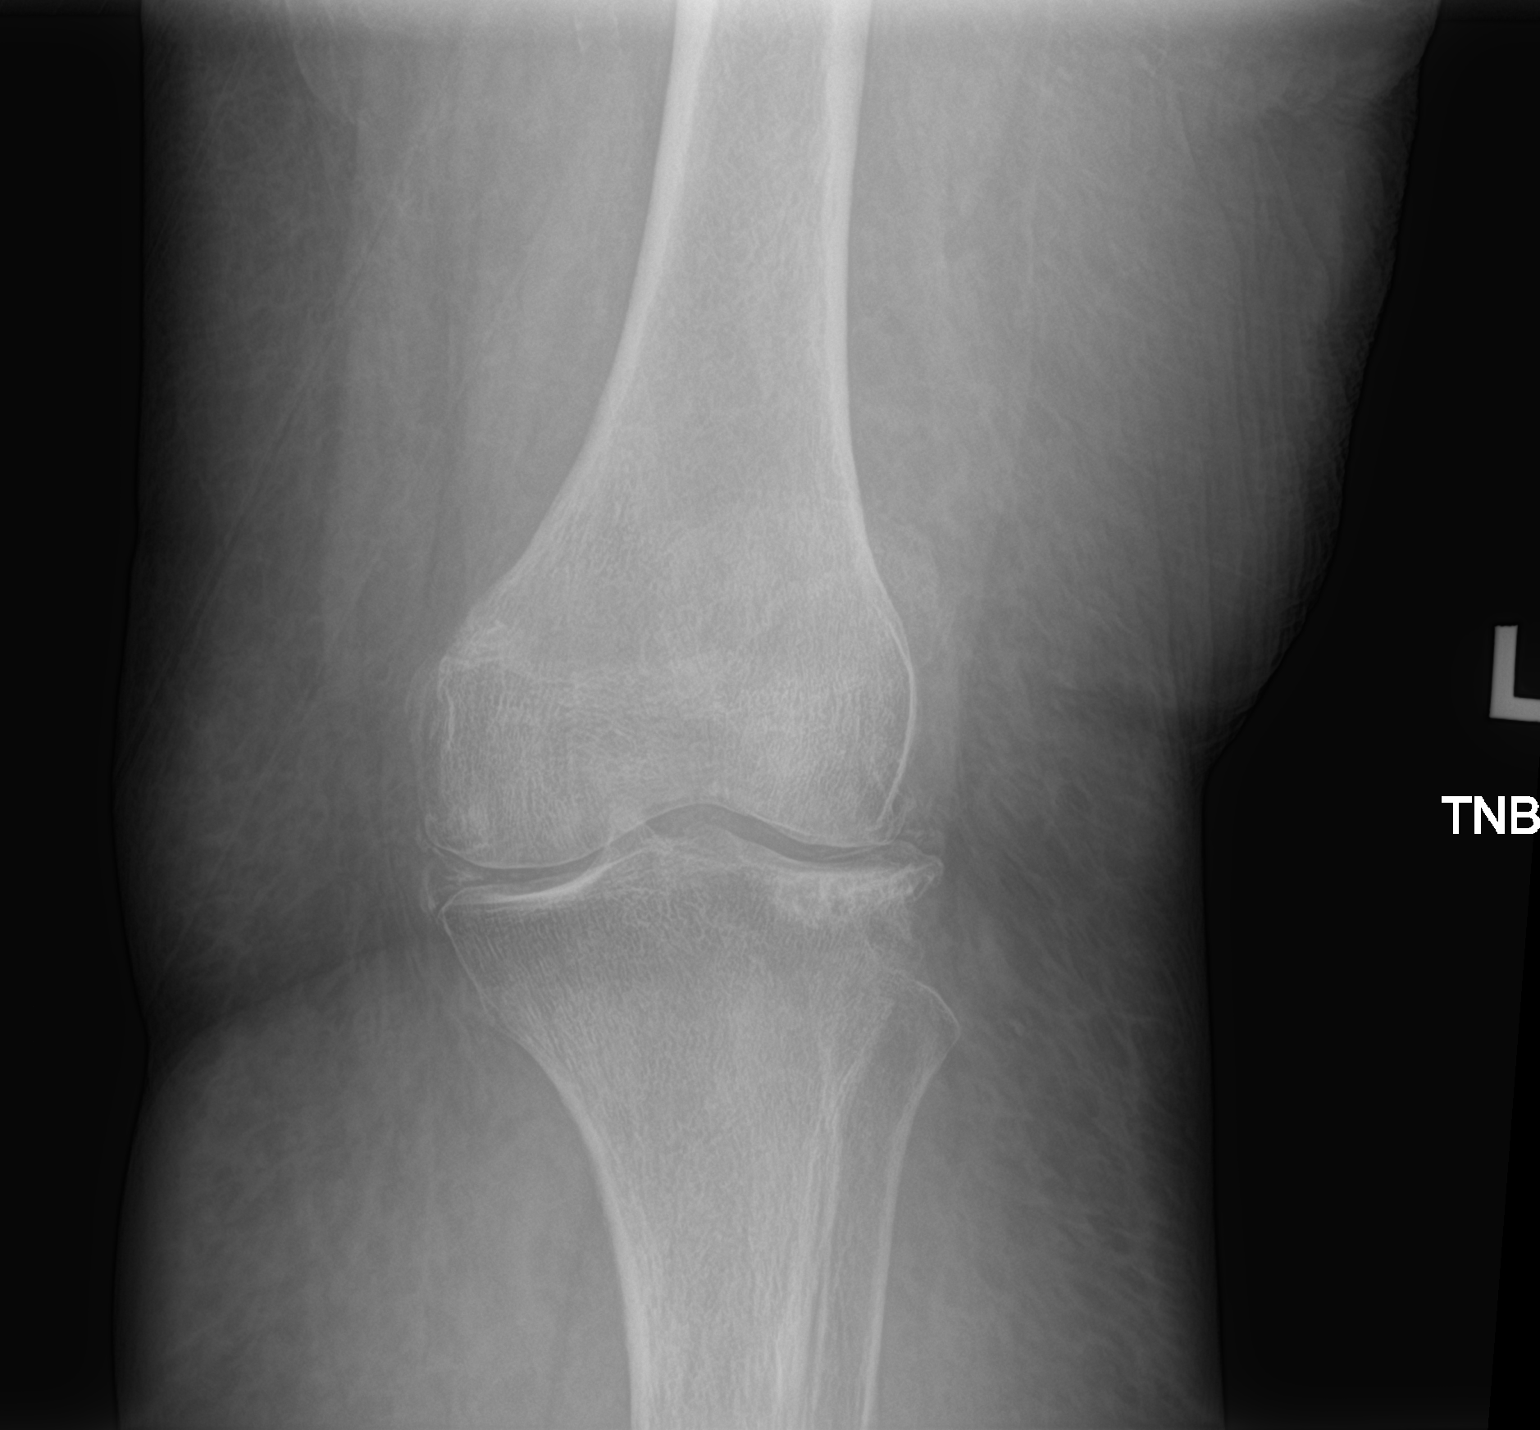

[knee lat]
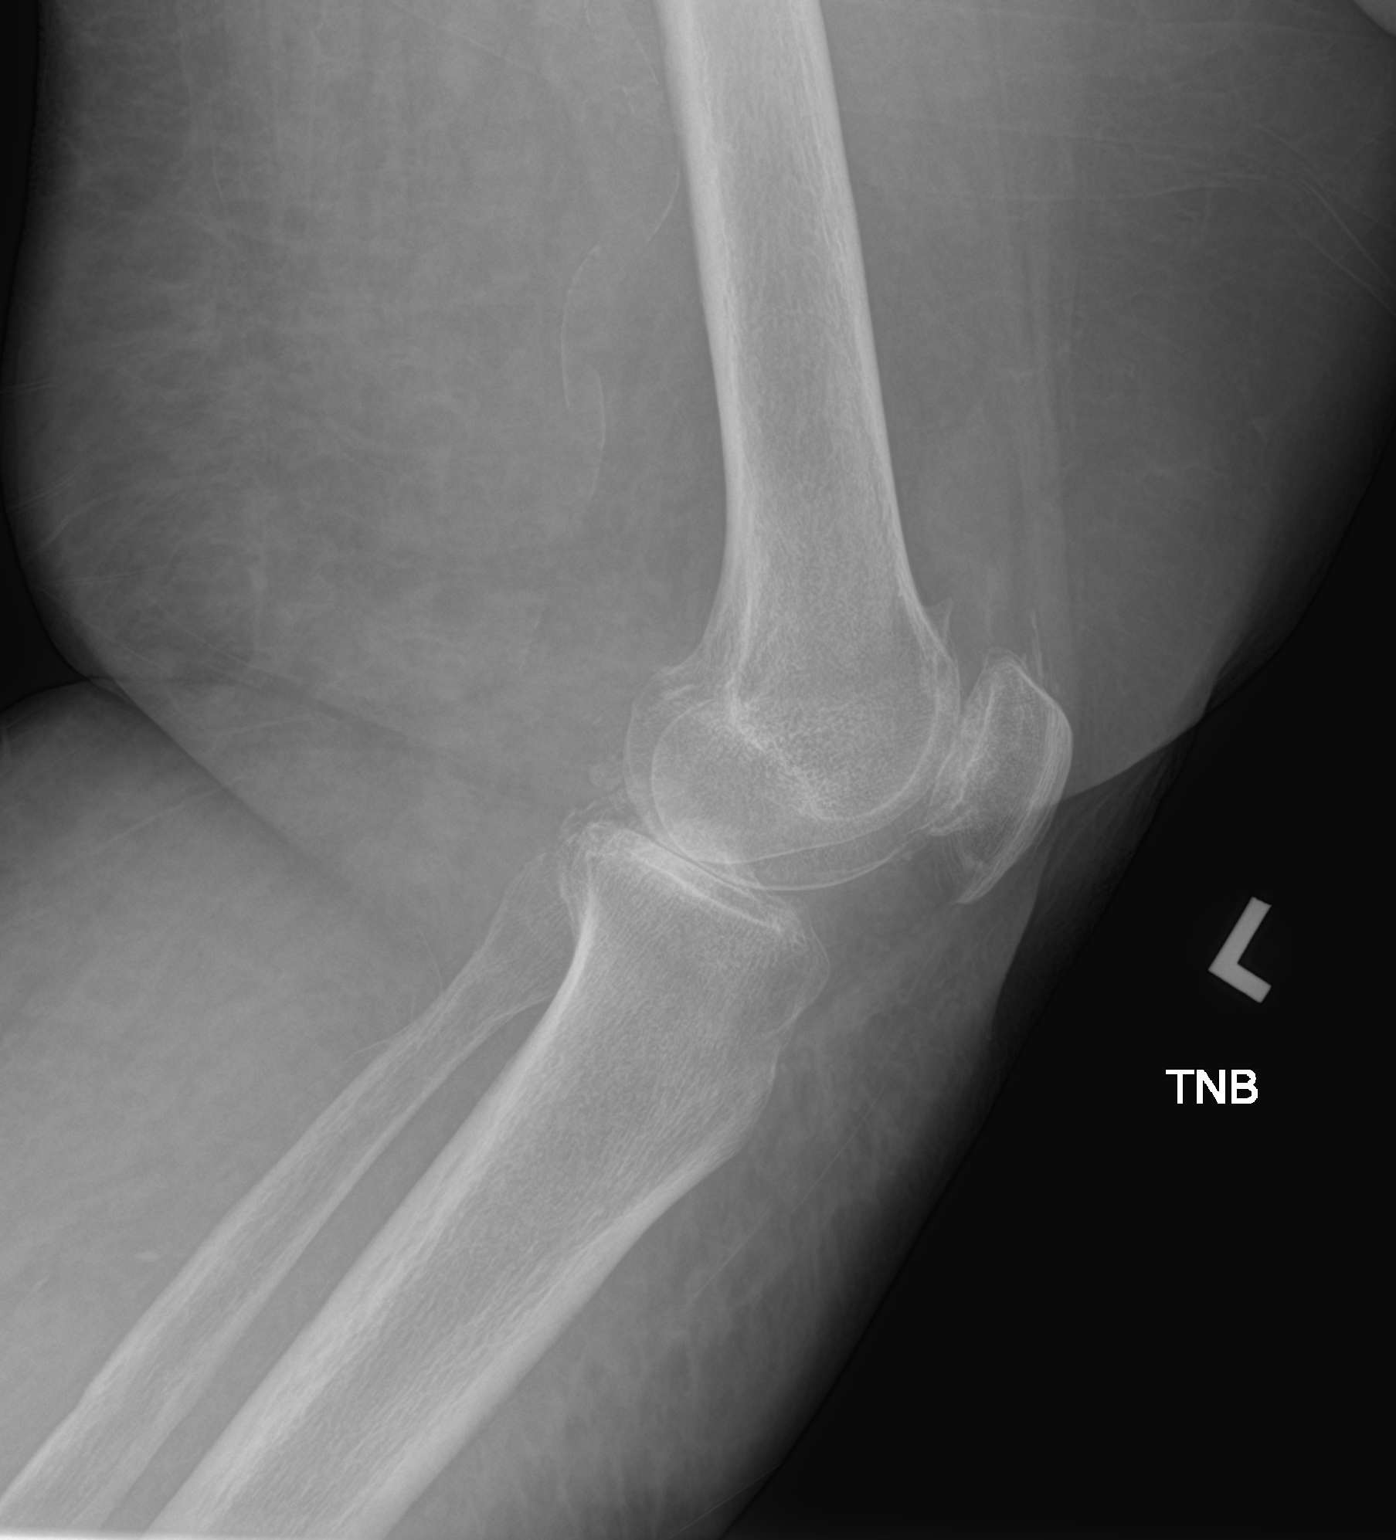

[knee sunrise]
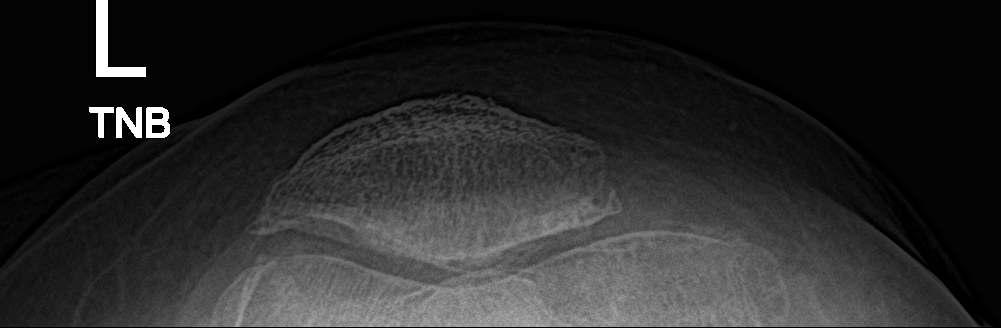

[3 of 3 positions shown; findings below may reference images not displayed]

FINDINGS: Tricompartmental degenerative changes are noted. No acute fracture
is seen. No joint effusion is noted.
IMPRESSION: Tricompartmental degenerative changes are again seen. No acute bony
abnormality is noted.
